# Patient Record
Sex: Male | Born: 1945 | Race: White | Hispanic: No | Marital: Married | Smoking: Former smoker
Health system: Southern US, Community
[De-identification: ages and names within clinical notes are randomized; demographics above are authoritative.]

## PROBLEM LIST (undated history)

## (undated) DIAGNOSIS — B1920 Unspecified viral hepatitis C without hepatic coma: Secondary | ICD-10-CM

## (undated) DIAGNOSIS — Z95818 Presence of other cardiac implants and grafts: Secondary | ICD-10-CM

## (undated) DIAGNOSIS — N189 Chronic kidney disease, unspecified: Secondary | ICD-10-CM

## (undated) DIAGNOSIS — C649 Malignant neoplasm of unspecified kidney, except renal pelvis: Secondary | ICD-10-CM

## (undated) DIAGNOSIS — I10 Essential (primary) hypertension: Secondary | ICD-10-CM

## (undated) DIAGNOSIS — K5730 Diverticulosis of large intestine without perforation or abscess without bleeding: Secondary | ICD-10-CM

## (undated) DIAGNOSIS — M179 Osteoarthritis of knee, unspecified: Secondary | ICD-10-CM

## (undated) DIAGNOSIS — R51 Headache: Secondary | ICD-10-CM

## (undated) DIAGNOSIS — F1911 Other psychoactive substance abuse, in remission: Secondary | ICD-10-CM

## (undated) DIAGNOSIS — I4719 Other supraventricular tachycardia: Secondary | ICD-10-CM

## (undated) DIAGNOSIS — N289 Disorder of kidney and ureter, unspecified: Secondary | ICD-10-CM

## (undated) DIAGNOSIS — Z87438 Personal history of other diseases of male genital organs: Secondary | ICD-10-CM

## (undated) DIAGNOSIS — R739 Hyperglycemia, unspecified: Secondary | ICD-10-CM

## (undated) DIAGNOSIS — I471 Supraventricular tachycardia: Secondary | ICD-10-CM

## (undated) DIAGNOSIS — E119 Type 2 diabetes mellitus without complications: Secondary | ICD-10-CM

## (undated) DIAGNOSIS — E785 Hyperlipidemia, unspecified: Secondary | ICD-10-CM

## (undated) DIAGNOSIS — K7460 Unspecified cirrhosis of liver: Secondary | ICD-10-CM

## (undated) DIAGNOSIS — K219 Gastro-esophageal reflux disease without esophagitis: Secondary | ICD-10-CM

## (undated) DIAGNOSIS — J189 Pneumonia, unspecified organism: Secondary | ICD-10-CM

## (undated) DIAGNOSIS — M1710 Unilateral primary osteoarthritis, unspecified knee: Secondary | ICD-10-CM

## (undated) HISTORY — DX: Other psychoactive substance abuse, in remission: F19.11

## (undated) HISTORY — DX: Personal history of other diseases of male genital organs: Z87.438

## (undated) HISTORY — DX: Chronic kidney disease, unspecified: N18.9

## (undated) HISTORY — DX: Hyperlipidemia, unspecified: E78.5

## (undated) HISTORY — DX: Unspecified viral hepatitis C without hepatic coma: B19.20

## (undated) HISTORY — DX: Pneumonia, unspecified organism: J18.9

## (undated) HISTORY — DX: Unspecified cirrhosis of liver: K74.60

## (undated) HISTORY — DX: Disorder of kidney and ureter, unspecified: N28.9

## (undated) HISTORY — DX: Supraventricular tachycardia: I47.1

## (undated) HISTORY — DX: Diverticulosis of large intestine without perforation or abscess without bleeding: K57.30

## (undated) HISTORY — PX: CARDIAC ELECTROPHYSIOLOGY MAPPING AND ABLATION: SHX1292

## (undated) HISTORY — PX: OTHER SURGICAL HISTORY: SHX169

## (undated) HISTORY — DX: Hyperglycemia, unspecified: R73.9

## (undated) HISTORY — DX: Other supraventricular tachycardia: I47.19

## (undated) HISTORY — DX: Presence of other cardiac implants and grafts: Z95.818

## (undated) HISTORY — DX: Unilateral primary osteoarthritis, unspecified knee: M17.10

## (undated) HISTORY — DX: Osteoarthritis of knee, unspecified: M17.9

## (undated) HISTORY — DX: Malignant neoplasm of unspecified kidney, except renal pelvis: C64.9

---

## 1973-08-01 DIAGNOSIS — J189 Pneumonia, unspecified organism: Secondary | ICD-10-CM

## 1973-08-01 HISTORY — DX: Pneumonia, unspecified organism: J18.9

## 1989-04-01 HISTORY — PX: OTHER SURGICAL HISTORY: SHX169

## 1997-08-01 HISTORY — PX: OTHER SURGICAL HISTORY: SHX169

## 1998-02-27 ENCOUNTER — Emergency Department (HOSPITAL_COMMUNITY): Admission: EM | Admit: 1998-02-27 | Discharge: 1998-02-27 | Payer: Self-pay | Admitting: Emergency Medicine

## 1998-03-19 ENCOUNTER — Ambulatory Visit (HOSPITAL_COMMUNITY): Admission: RE | Admit: 1998-03-19 | Discharge: 1998-03-19 | Payer: Self-pay | Admitting: Gastroenterology

## 1998-03-25 ENCOUNTER — Ambulatory Visit (HOSPITAL_COMMUNITY): Admission: RE | Admit: 1998-03-25 | Discharge: 1998-03-25 | Payer: Self-pay | Admitting: Gastroenterology

## 1998-04-09 ENCOUNTER — Inpatient Hospital Stay (HOSPITAL_COMMUNITY): Admission: RE | Admit: 1998-04-09 | Discharge: 1998-04-14 | Payer: Self-pay | Admitting: Urology

## 1999-04-27 ENCOUNTER — Observation Stay (HOSPITAL_COMMUNITY): Admission: RE | Admit: 1999-04-27 | Discharge: 1999-04-28 | Payer: Self-pay | Admitting: Orthopedic Surgery

## 1999-05-13 ENCOUNTER — Encounter: Admission: RE | Admit: 1999-05-13 | Discharge: 1999-05-13 | Payer: Self-pay | Admitting: Urology

## 1999-11-11 ENCOUNTER — Encounter: Admission: RE | Admit: 1999-11-11 | Discharge: 1999-11-11 | Payer: Self-pay | Admitting: Urology

## 1999-11-11 ENCOUNTER — Encounter: Payer: Self-pay | Admitting: Urology

## 2000-05-25 ENCOUNTER — Encounter: Admission: RE | Admit: 2000-05-25 | Discharge: 2000-05-25 | Payer: Self-pay | Admitting: Urology

## 2000-05-25 ENCOUNTER — Encounter: Payer: Self-pay | Admitting: Urology

## 2000-07-11 ENCOUNTER — Encounter (INDEPENDENT_AMBULATORY_CARE_PROVIDER_SITE_OTHER): Payer: Self-pay

## 2000-07-11 ENCOUNTER — Ambulatory Visit (HOSPITAL_COMMUNITY): Admission: RE | Admit: 2000-07-11 | Discharge: 2000-07-11 | Payer: Self-pay | Admitting: Gastroenterology

## 2000-11-09 ENCOUNTER — Encounter: Admission: RE | Admit: 2000-11-09 | Discharge: 2000-11-09 | Payer: Self-pay | Admitting: Urology

## 2000-11-09 ENCOUNTER — Encounter: Payer: Self-pay | Admitting: Urology

## 2001-05-23 ENCOUNTER — Encounter: Admission: RE | Admit: 2001-05-23 | Discharge: 2001-05-23 | Payer: Self-pay | Admitting: Urology

## 2001-05-23 ENCOUNTER — Encounter: Payer: Self-pay | Admitting: Urology

## 2001-11-29 ENCOUNTER — Encounter: Payer: Self-pay | Admitting: Urology

## 2001-11-29 ENCOUNTER — Encounter: Admission: RE | Admit: 2001-11-29 | Discharge: 2001-11-29 | Payer: Self-pay | Admitting: Urology

## 2002-05-02 ENCOUNTER — Encounter: Admission: RE | Admit: 2002-05-02 | Discharge: 2002-05-02 | Payer: Self-pay | Admitting: Urology

## 2002-05-02 ENCOUNTER — Encounter: Payer: Self-pay | Admitting: Urology

## 2002-11-27 ENCOUNTER — Encounter: Payer: Self-pay | Admitting: Urology

## 2002-11-27 ENCOUNTER — Encounter: Admission: RE | Admit: 2002-11-27 | Discharge: 2002-11-27 | Payer: Self-pay | Admitting: Urology

## 2003-05-01 ENCOUNTER — Encounter: Payer: Self-pay | Admitting: Urology

## 2003-05-01 ENCOUNTER — Ambulatory Visit (HOSPITAL_COMMUNITY): Admission: RE | Admit: 2003-05-01 | Discharge: 2003-05-01 | Payer: Self-pay | Admitting: Urology

## 2003-11-25 ENCOUNTER — Ambulatory Visit (HOSPITAL_COMMUNITY): Admission: RE | Admit: 2003-11-25 | Discharge: 2003-11-25 | Payer: Self-pay | Admitting: Urology

## 2003-12-12 ENCOUNTER — Encounter: Payer: Self-pay | Admitting: Internal Medicine

## 2005-08-01 HISTORY — PX: OTHER SURGICAL HISTORY: SHX169

## 2005-12-21 ENCOUNTER — Ambulatory Visit: Payer: Self-pay | Admitting: Internal Medicine

## 2006-01-17 ENCOUNTER — Ambulatory Visit: Payer: Self-pay | Admitting: Internal Medicine

## 2007-01-16 ENCOUNTER — Ambulatory Visit: Payer: Self-pay | Admitting: Internal Medicine

## 2007-01-16 LAB — CONVERTED CEMR LAB
ALT: 70 units/L — ABNORMAL HIGH (ref 0–40)
AST: 55 units/L — ABNORMAL HIGH (ref 0–37)
Albumin: 3.9 g/dL (ref 3.5–5.2)
Alkaline Phosphatase: 38 units/L — ABNORMAL LOW (ref 39–117)
BUN: 16 mg/dL (ref 6–23)
Basophils Absolute: 0 10*3/uL (ref 0.0–0.1)
Basophils Relative: 0.1 % (ref 0.0–1.0)
Bilirubin Urine: NEGATIVE
Bilirubin, Direct: 0.1 mg/dL (ref 0.0–0.3)
CO2: 28 meq/L (ref 19–32)
Calcium: 9 mg/dL (ref 8.4–10.5)
Chloride: 107 meq/L (ref 96–112)
Cholesterol: 123 mg/dL (ref 0–200)
Creatinine, Ser: 1.1 mg/dL (ref 0.4–1.5)
Eosinophils Absolute: 0.2 10*3/uL (ref 0.0–0.6)
Eosinophils Relative: 3.2 % (ref 0.0–5.0)
GFR calc Af Amer: 88 mL/min
GFR calc non Af Amer: 72 mL/min
Glucose, Bld: 120 mg/dL — ABNORMAL HIGH (ref 70–99)
HCT: 41.6 % (ref 39.0–52.0)
HDL: 24.4 mg/dL — ABNORMAL LOW (ref >39.0)
Hemoglobin, Urine: NEGATIVE
Hemoglobin: 14.1 g/dL (ref 13.0–17.0)
Ketones, ur: NEGATIVE mg/dL
LDL Cholesterol: 85 mg/dL (ref 0–99)
Leukocytes, UA: NEGATIVE
Lymphocytes Relative: 18.4 % (ref 12.0–46.0)
MCHC: 33.9 g/dL (ref 30.0–36.0)
MCV: 93.1 fL (ref 78.0–100.0)
Monocytes Absolute: 0.9 10*3/uL — ABNORMAL HIGH (ref 0.2–0.7)
Monocytes Relative: 12.2 % — ABNORMAL HIGH (ref 3.0–11.0)
Neutro Abs: 4.7 10*3/uL (ref 1.4–7.7)
Neutrophils Relative %: 66.1 % (ref 43.0–77.0)
Nitrite: NEGATIVE
PSA: 0.46 ng/mL (ref 0.10–4.00)
Platelets: 223 10*3/uL (ref 150–400)
Potassium: 4 meq/L (ref 3.5–5.1)
RBC: 4.47 M/uL (ref 4.22–5.81)
RDW: 12.7 % (ref 11.5–14.6)
Sodium: 141 meq/L (ref 135–145)
Specific Gravity, Urine: 1.02 (ref 1.000–1.03)
TSH: 1.36 microintl units/mL (ref 0.35–5.50)
Total Bilirubin: 0.9 mg/dL (ref 0.3–1.2)
Total CHOL/HDL Ratio: 5
Total Protein, Urine: NEGATIVE mg/dL
Total Protein: 7.2 g/dL (ref 6.0–8.3)
Triglycerides: 67 mg/dL (ref 0–149)
Urine Glucose: NEGATIVE mg/dL
Urobilinogen, UA: 0.2 (ref 0.0–1.0)
VLDL: 13 mg/dL (ref 0–40)
WBC: 7.1 10*3/uL (ref 4.5–10.5)
pH: 6.5 (ref 5.0–8.0)

## 2007-01-22 ENCOUNTER — Ambulatory Visit: Payer: Self-pay | Admitting: Internal Medicine

## 2007-02-28 ENCOUNTER — Ambulatory Visit: Payer: Self-pay | Admitting: Internal Medicine

## 2007-07-02 ENCOUNTER — Emergency Department (HOSPITAL_COMMUNITY): Admission: EM | Admit: 2007-07-02 | Discharge: 2007-07-02 | Payer: Self-pay | Admitting: Emergency Medicine

## 2007-07-04 ENCOUNTER — Telehealth: Payer: Self-pay | Admitting: Internal Medicine

## 2007-07-04 DIAGNOSIS — I499 Cardiac arrhythmia, unspecified: Secondary | ICD-10-CM | POA: Insufficient documentation

## 2007-07-06 ENCOUNTER — Ambulatory Visit: Payer: Self-pay

## 2007-07-17 ENCOUNTER — Encounter: Payer: Self-pay | Admitting: Internal Medicine

## 2007-09-18 ENCOUNTER — Telehealth: Payer: Self-pay | Admitting: Internal Medicine

## 2007-09-18 ENCOUNTER — Encounter: Payer: Self-pay | Admitting: Internal Medicine

## 2007-10-02 ENCOUNTER — Encounter: Payer: Self-pay | Admitting: Internal Medicine

## 2007-10-26 DIAGNOSIS — C649 Malignant neoplasm of unspecified kidney, except renal pelvis: Secondary | ICD-10-CM | POA: Insufficient documentation

## 2007-10-26 DIAGNOSIS — J189 Pneumonia, unspecified organism: Secondary | ICD-10-CM | POA: Insufficient documentation

## 2007-10-26 DIAGNOSIS — Z9189 Other specified personal risk factors, not elsewhere classified: Secondary | ICD-10-CM | POA: Insufficient documentation

## 2007-10-26 DIAGNOSIS — K5730 Diverticulosis of large intestine without perforation or abscess without bleeding: Secondary | ICD-10-CM | POA: Insufficient documentation

## 2007-10-26 DIAGNOSIS — F1910 Other psychoactive substance abuse, uncomplicated: Secondary | ICD-10-CM | POA: Insufficient documentation

## 2007-10-26 DIAGNOSIS — Z87898 Personal history of other specified conditions: Secondary | ICD-10-CM | POA: Insufficient documentation

## 2007-10-26 DIAGNOSIS — Z9889 Other specified postprocedural states: Secondary | ICD-10-CM | POA: Insufficient documentation

## 2007-10-26 DIAGNOSIS — M1710 Unilateral primary osteoarthritis, unspecified knee: Secondary | ICD-10-CM | POA: Insufficient documentation

## 2007-10-26 DIAGNOSIS — E119 Type 2 diabetes mellitus without complications: Secondary | ICD-10-CM | POA: Insufficient documentation

## 2007-10-26 DIAGNOSIS — IMO0002 Reserved for concepts with insufficient information to code with codable children: Secondary | ICD-10-CM | POA: Insufficient documentation

## 2007-10-26 DIAGNOSIS — E1129 Type 2 diabetes mellitus with other diabetic kidney complication: Secondary | ICD-10-CM | POA: Insufficient documentation

## 2007-10-26 DIAGNOSIS — M179 Osteoarthritis of knee, unspecified: Secondary | ICD-10-CM | POA: Insufficient documentation

## 2007-10-26 DIAGNOSIS — B1710 Acute hepatitis C without hepatic coma: Secondary | ICD-10-CM | POA: Insufficient documentation

## 2007-10-26 DIAGNOSIS — Z905 Acquired absence of kidney: Secondary | ICD-10-CM | POA: Insufficient documentation

## 2007-11-15 ENCOUNTER — Encounter: Payer: Self-pay | Admitting: Internal Medicine

## 2007-11-18 LAB — HM COLONOSCOPY

## 2007-11-19 ENCOUNTER — Encounter: Admission: RE | Admit: 2007-11-19 | Discharge: 2007-11-19 | Payer: Self-pay | Admitting: Gastroenterology

## 2008-01-23 ENCOUNTER — Telehealth (INDEPENDENT_AMBULATORY_CARE_PROVIDER_SITE_OTHER): Payer: Self-pay | Admitting: *Deleted

## 2008-03-17 ENCOUNTER — Encounter: Payer: Self-pay | Admitting: Internal Medicine

## 2008-04-11 ENCOUNTER — Ambulatory Visit: Payer: Self-pay | Admitting: Internal Medicine

## 2008-04-11 LAB — CONVERTED CEMR LAB
ALT: 80 units/L — ABNORMAL HIGH (ref 0–53)
AST: 56 units/L — ABNORMAL HIGH (ref 0–37)
Albumin: 4.1 g/dL (ref 3.5–5.2)
Alkaline Phosphatase: 44 units/L (ref 39–117)
BUN: 15 mg/dL (ref 6–23)
Basophils Absolute: 0.1 10*3/uL (ref 0.0–0.1)
Basophils Relative: 1.3 % (ref 0.0–3.0)
Bilirubin Urine: NEGATIVE
Bilirubin, Direct: 0.2 mg/dL (ref 0.0–0.3)
CO2: 30 meq/L (ref 19–32)
Calcium: 9 mg/dL (ref 8.4–10.5)
Chloride: 106 meq/L (ref 96–112)
Cholesterol: 116 mg/dL (ref 0–200)
Creatinine, Ser: 1.1 mg/dL (ref 0.4–1.5)
Eosinophils Absolute: 0.2 10*3/uL (ref 0.0–0.7)
Eosinophils Relative: 3.4 % (ref 0.0–5.0)
GFR calc Af Amer: 87 mL/min
GFR calc non Af Amer: 72 mL/min
Glucose, Bld: 117 mg/dL — ABNORMAL HIGH (ref 70–99)
HCT: 42.6 % (ref 39.0–52.0)
HDL: 24.6 mg/dL — ABNORMAL LOW (ref >39.0)
Hemoglobin, Urine: NEGATIVE
Hemoglobin: 14.9 g/dL (ref 13.0–17.0)
Ketones, ur: NEGATIVE mg/dL
LDL Cholesterol: 80 mg/dL (ref 0–99)
Leukocytes, UA: NEGATIVE
Lymphocytes Relative: 25.4 % (ref 12.0–46.0)
MCHC: 35 g/dL (ref 30.0–36.0)
MCV: 93.8 fL (ref 78.0–100.0)
Monocytes Absolute: 0.6 10*3/uL (ref 0.1–1.0)
Monocytes Relative: 11.4 % (ref 3.0–12.0)
Neutro Abs: 3.1 10*3/uL (ref 1.4–7.7)
Neutrophils Relative %: 58.5 % (ref 43.0–77.0)
Nitrite: NEGATIVE
PSA: 0.37 ng/mL (ref 0.10–4.00)
Platelets: 192 10*3/uL (ref 150–400)
Potassium: 3.9 meq/L (ref 3.5–5.1)
RBC: 4.53 M/uL (ref 4.22–5.81)
RDW: 12.7 % (ref 11.5–14.6)
Sodium: 140 meq/L (ref 135–145)
Specific Gravity, Urine: 1.02 (ref 1.000–1.03)
TSH: 1.42 microintl units/mL (ref 0.35–5.50)
Total Bilirubin: 0.8 mg/dL (ref 0.3–1.2)
Total CHOL/HDL Ratio: 4.7
Total Protein, Urine: NEGATIVE mg/dL
Total Protein: 7 g/dL (ref 6.0–8.3)
Triglycerides: 56 mg/dL (ref 0–149)
Urine Glucose: NEGATIVE mg/dL
Urobilinogen, UA: 0.2 (ref 0.0–1.0)
VLDL: 11 mg/dL (ref 0–40)
WBC: 5.4 10*3/uL (ref 4.5–10.5)
pH: 6 (ref 5.0–8.0)

## 2008-04-17 ENCOUNTER — Ambulatory Visit: Payer: Self-pay | Admitting: Internal Medicine

## 2008-04-24 ENCOUNTER — Telehealth: Payer: Self-pay | Admitting: Internal Medicine

## 2008-04-24 ENCOUNTER — Ambulatory Visit: Payer: Self-pay | Admitting: Internal Medicine

## 2008-07-21 ENCOUNTER — Telehealth: Payer: Self-pay | Admitting: Internal Medicine

## 2008-07-29 ENCOUNTER — Encounter: Payer: Self-pay | Admitting: Internal Medicine

## 2008-07-31 ENCOUNTER — Emergency Department (HOSPITAL_COMMUNITY): Admission: EM | Admit: 2008-07-31 | Discharge: 2008-07-31 | Payer: Self-pay | Admitting: Emergency Medicine

## 2008-08-01 ENCOUNTER — Emergency Department (HOSPITAL_COMMUNITY): Admission: EM | Admit: 2008-08-01 | Discharge: 2008-08-01 | Payer: Self-pay | Admitting: Emergency Medicine

## 2008-08-04 ENCOUNTER — Encounter: Payer: Self-pay | Admitting: Internal Medicine

## 2009-03-17 ENCOUNTER — Encounter: Payer: Self-pay | Admitting: Internal Medicine

## 2009-04-07 ENCOUNTER — Ambulatory Visit: Payer: Self-pay | Admitting: Internal Medicine

## 2009-04-07 LAB — CONVERTED CEMR LAB
ALT: 68 units/L — ABNORMAL HIGH (ref 0–53)
AST: 56 units/L — ABNORMAL HIGH (ref 0–37)
Albumin: 4.1 g/dL (ref 3.5–5.2)
Alkaline Phosphatase: 37 units/L — ABNORMAL LOW (ref 39–117)
BUN: 16 mg/dL (ref 6–23)
Basophils Absolute: 0 10*3/uL (ref 0.0–0.1)
Basophils Relative: 0.5 % (ref 0.0–3.0)
Bilirubin Urine: NEGATIVE
Bilirubin, Direct: 0.2 mg/dL (ref 0.0–0.3)
CO2: 28 meq/L (ref 19–32)
Calcium: 9.1 mg/dL (ref 8.4–10.5)
Chloride: 104 meq/L (ref 96–112)
Cholesterol: 124 mg/dL (ref 0–200)
Creatinine, Ser: 1.1 mg/dL (ref 0.4–1.5)
Eosinophils Absolute: 0.1 10*3/uL (ref 0.0–0.7)
Eosinophils Relative: 2.6 % (ref 0.0–5.0)
GFR calc non Af Amer: 71.74 mL/min (ref >60)
Glucose, Bld: 118 mg/dL — ABNORMAL HIGH (ref 70–99)
HCT: 44.8 % (ref 39.0–52.0)
HDL: 29.1 mg/dL — ABNORMAL LOW (ref >39.00)
Hemoglobin, Urine: NEGATIVE
Hemoglobin: 15 g/dL (ref 13.0–17.0)
Ketones, ur: NEGATIVE mg/dL
LDL Cholesterol: 84 mg/dL (ref 0–99)
Leukocytes, UA: NEGATIVE
Lymphocytes Relative: 21.1 % (ref 12.0–46.0)
Lymphs Abs: 1.2 10*3/uL (ref 0.7–4.0)
MCHC: 33.5 g/dL (ref 30.0–36.0)
MCV: 96.4 fL (ref 78.0–100.0)
Monocytes Absolute: 0.6 10*3/uL (ref 0.1–1.0)
Monocytes Relative: 11.1 % (ref 3.0–12.0)
Neutro Abs: 3.7 10*3/uL (ref 1.4–7.7)
Neutrophils Relative %: 64.7 % (ref 43.0–77.0)
Nitrite: NEGATIVE
PSA: 0.45 ng/mL (ref 0.10–4.00)
Platelets: 190 10*3/uL (ref 150.0–400.0)
Potassium: 3.8 meq/L (ref 3.5–5.1)
RBC: 4.65 M/uL (ref 4.22–5.81)
RDW: 12.8 % (ref 11.5–14.6)
Sodium: 140 meq/L (ref 135–145)
Specific Gravity, Urine: 1.01 (ref 1.000–1.030)
TSH: 1.06 microintl units/mL (ref 0.35–5.50)
Total Bilirubin: 1.2 mg/dL (ref 0.3–1.2)
Total CHOL/HDL Ratio: 4
Total Protein, Urine: NEGATIVE mg/dL
Total Protein: 8.1 g/dL (ref 6.0–8.3)
Triglycerides: 55 mg/dL (ref 0.0–149.0)
Urine Glucose: NEGATIVE mg/dL
Urobilinogen, UA: 0.2 (ref 0.0–1.0)
VLDL: 11 mg/dL (ref 0.0–40.0)
WBC: 5.6 10*3/uL (ref 4.5–10.5)
pH: 6.5 (ref 5.0–8.0)

## 2009-04-20 ENCOUNTER — Ambulatory Visit: Payer: Self-pay | Admitting: Internal Medicine

## 2009-11-05 ENCOUNTER — Encounter: Payer: Self-pay | Admitting: Internal Medicine

## 2010-03-24 ENCOUNTER — Encounter: Payer: Self-pay | Admitting: Internal Medicine

## 2010-04-01 ENCOUNTER — Encounter: Payer: Self-pay | Admitting: Internal Medicine

## 2010-04-07 ENCOUNTER — Encounter: Admission: RE | Admit: 2010-04-07 | Discharge: 2010-04-07 | Payer: Self-pay | Admitting: Gastroenterology

## 2010-04-20 ENCOUNTER — Ambulatory Visit: Payer: Self-pay | Admitting: Internal Medicine

## 2010-04-20 LAB — CONVERTED CEMR LAB
ALT: 81 units/L — ABNORMAL HIGH (ref 0–53)
AST: 69 units/L — ABNORMAL HIGH (ref 0–37)
Albumin: 4.3 g/dL (ref 3.5–5.2)
Alkaline Phosphatase: 39 units/L (ref 39–117)
BUN: 18 mg/dL (ref 6–23)
Basophils Absolute: 0 10*3/uL (ref 0.0–0.1)
Basophils Relative: 0.4 % (ref 0.0–3.0)
Bilirubin Urine: NEGATIVE
Bilirubin, Direct: 0.2 mg/dL (ref 0.0–0.3)
CO2: 29 meq/L (ref 19–32)
Calcium: 9.5 mg/dL (ref 8.4–10.5)
Chloride: 101 meq/L (ref 96–112)
Cholesterol: 152 mg/dL (ref 0–200)
Creatinine, Ser: 1.2 mg/dL (ref 0.4–1.5)
Eosinophils Absolute: 0.2 10*3/uL (ref 0.0–0.7)
Eosinophils Relative: 3.1 % (ref 0.0–5.0)
GFR calc non Af Amer: 63.46 mL/min (ref >60)
Glucose, Bld: 122 mg/dL — ABNORMAL HIGH (ref 70–99)
HCT: 43.7 % (ref 39.0–52.0)
HDL: 30.6 mg/dL — ABNORMAL LOW (ref >39.00)
Hemoglobin, Urine: NEGATIVE
Hemoglobin: 15.1 g/dL (ref 13.0–17.0)
Ketones, ur: NEGATIVE mg/dL
LDL Cholesterol: 107 mg/dL — ABNORMAL HIGH (ref 0–99)
Lymphocytes Relative: 23.1 % (ref 12.0–46.0)
Lymphs Abs: 1.4 10*3/uL (ref 0.7–4.0)
MCHC: 34.5 g/dL (ref 30.0–36.0)
MCV: 95.4 fL (ref 78.0–100.0)
Monocytes Absolute: 0.8 10*3/uL (ref 0.1–1.0)
Monocytes Relative: 12.8 % — ABNORMAL HIGH (ref 3.0–12.0)
Neutro Abs: 3.7 10*3/uL (ref 1.4–7.7)
Neutrophils Relative %: 60.6 % (ref 43.0–77.0)
Nitrite: NEGATIVE
PSA: 0.45 ng/mL (ref 0.10–4.00)
Platelets: 203 10*3/uL (ref 150.0–400.0)
Potassium: 4.6 meq/L (ref 3.5–5.1)
RBC: 4.58 M/uL (ref 4.22–5.81)
RDW: 13.6 % (ref 11.5–14.6)
Sodium: 139 meq/L (ref 135–145)
Specific Gravity, Urine: <=1.005 (ref 1.000–1.030)
TSH: 1.3 microintl units/mL (ref 0.35–5.50)
Total Bilirubin: 1 mg/dL (ref 0.3–1.2)
Total CHOL/HDL Ratio: 5
Total Protein, Urine: NEGATIVE mg/dL
Total Protein: 7.1 g/dL (ref 6.0–8.3)
Triglycerides: 73 mg/dL (ref 0.0–149.0)
Urine Glucose: NEGATIVE mg/dL
Urobilinogen, UA: 0.2 (ref 0.0–1.0)
VLDL: 14.6 mg/dL (ref 0.0–40.0)
WBC: 6.2 10*3/uL (ref 4.5–10.5)
pH: 7 (ref 5.0–8.0)

## 2010-04-29 ENCOUNTER — Ambulatory Visit: Payer: Self-pay | Admitting: Internal Medicine

## 2010-04-29 ENCOUNTER — Encounter: Payer: Self-pay | Admitting: Internal Medicine

## 2010-08-31 NOTE — Assessment & Plan Note (Signed)
Summary: CPX/BCBS/$50/CD   Vital Signs:  Patient profile:   65 year old male Height:      73 inches Weight:      207 pounds BMI:     27.41 Temp:     97.3 degrees F oral Pulse rate:   84 / minute Pulse rhythm:   regular BP sitting:   112 / 80  (left arm) Cuff size:   large  Vitals Entered By: Rock Nephew CMA (April 20, 2009 10:03 AM) CC: cpx....pt not longer on glucosamine Is Patient Diabetic? No   Primary Care Provider:  Jacques Navy MD  CC:  cpx....pt not longer on glucosamine.  History of Present Illness: Patient presents for medical follow-up. He has been doing well. Issues in the interval include epistasis that was treated with cautery by Dr. Lazarus Salines. He has seen Dr. Kinnie Scales in August and was doing OK. His last liver bx showed no cirrhosis, 1+ inflammation. Dr. Kinnie Scales wants to repeat liver bx in 1 year. We discussed prognosis which is good with low level of inflammation and no cirrhosis. Also the percentage of patients who progress to liver failure or hepatoma is 12.5% or less. There are also new treatments that are better tolerated with more to come.  He did have episodes of rapide heart rate, sustained, in the past but has had event recorder with no report of any malignant arrhythmias. He does have PVC's by his report. No sustained irregular heart rate, no chest pain, no limitation in activities.     Preventive Screening-Counseling & Management  Alcohol-Tobacco     Alcohol drinks/day: 0     Smoking Status: quit     Year Quit: 1979     Pack years: 15  Caffeine-Diet-Exercise     Caffeine use/day: 3 cups of coffee     Diet Comments: healthy diet     Does Patient Exercise: yes     Type of exercise: treadmill     Exercise (avg: min/session): 30-60     Times/week: 3  Current Medications (verified): 1)  Prilosec 20 Mg Cpdr (Omeprazole) .... Take 1 Tab By Mouth At Bedtime 2)  Multivitamins   Tabs (Multiple Vitamin) .... Take Once Daily 3)  Aspir-Low 81 Mg  Tbec (Aspirin) .[name].. 1 By Mouth Once Daily 4)  Juice Plus Fibre  Liqd (Nutritional Supplements) .... Daily  Allergies (verified): No Known Drug Allergies  Past History:  Past Medical History: Last updated: 10/26/2007 Hx of OSTEOARTHRITIS, KNEE (ICD-715.96) Hx of DIVERTICULOSIS, COLON (ICD-562.10) * SHORTENED RIGHT LEG CAUSES SOME BACK PAIN. Hx of PNEUMONIA (ICD-486) Hx of SUBSTANCE ABUSE (ICD-305.90) Hx of CARCINOMA, RENAL CELL (ICD-189.0) HYPERGLYCEMIA (ICD-790.29) BENIGN PROSTATIC HYPERTROPHY, HX OF (ICD-V13.8) HEPATITIS C (ICD-070.51) UNSPECIFIED CARDIAC DYSRHYTHMIA (ICD-427.9)    Past Surgical History: Last updated: 10/26/2007 ARTHROSCOPY, LEFT KNEE, HX OF (ICD-V45.89) * LEFT SHOULDER REPAIR  FOR BONE SPUR. NEEDLE BIOPSY, LIVER, HX OF (ICD-V15.89) * TRANSURETHRAL NEEDLE ABLATION PROCEDURE. * TUNA PROCEDURE NEPHRECTOMY, HX OF (ICD-V45.73)  Family History: Father - deceased @ 44: variceal hemorrhae secondary to EtOH Mother - deceased @ 45: CVA, CAD Neg- colon; CAD/MI; Brother - died head and neck cancer; had prostate cancer Sister- NIDDM Sister - died EtOH withdrawal Strong family h/o EtOHism  Social History: [place] [employer], [place] - [place] -psych Married - '84 2 step-daughters Work - Buyer, retail Marriage in good healthCaffeine use/day:  3 cups of coffee  Review of Systems  The patient denies anorexia, weight loss, weight gain, decreased hearing,  chest pain, syncope, dyspnea on exertion, peripheral edema, prolonged cough, abdominal pain, severe indigestion/heartburn, genital sores, muscle weakness, difficulty walking, depression, enlarged lymph nodes, and angioedema.         arthritic pain in hands with a morning gel period 20-30 min.  Physical Exam  General:  WNWD whaite male in no distress, comfortable and a good historian Head:  Normocephalic and atraumatic without obvious abnormalities. No apparent alopecia or balding. Eyes:   vision grossly intact, corneas and lenses clear, and no injection.   Ears:  External ear exam shows no significant lesions or deformities.  Otoscopic examination reveals clear canals, tympanic membranes are intact bilaterally without bulging, retraction, inflammation or discharge. Hearing is grossly normal bilaterally. Nose:  no external deformity and no external erythema.   Mouth:  Oral mucosa and oropharynx without lesions or exudates.  Teeth in good repair. Neck:  supple, full ROM, no thyromegaly, and no carotid bruits.   Chest Wall:  no deformities and no masses.   Lungs:  Normal respiratory effort, chest expands symmetrically. Lungs are clear to auscultation, no crackles or wheezes. Heart:  Normal rate and regular rhythm. S1 and S2 normal without gallop, murmur, click, rub or other extra sounds. No PVC's with carefl auscultation Abdomen:  soft, non-tender, normal bowel sounds, no guarding, and no rigidity.  Well healed RUQ scar. No hepatomegaly or palpable liver edge Scar at umbilicus Rectal:  No external abnormalities noted. Normal sphincter tone. No rectal masses or tenderness. Prostate:  Prostate gland firm and smooth, no enlargement, nodularity, tenderness, mass, asymmetry or induration. Msk:  normal ROM, no joint tenderness, no joint swelling, no joint warmth, no redness over joints, and no joint instability.   Pulses:  2+ radial and DP pulses Extremities:  No clubbing, cyanosis, edema, or deformity noted with normal full range of motion of all joints.   Neurologic:  alert & oriented X3, cranial nerves II-XII intact, strength normal in all extremities, gait normal, and DTRs symmetrical and normal.   Skin:  turgor normal, color normal, no rashes, no suspicious lesions, no ulcerations, and no edema.   Cervical Nodes:  no anterior cervical adenopathy and no posterior cervical adenopathy.   Inguinal Nodes:  no R inguinal adenopathy and no L inguinal adenopathy.   Psych:  Oriented X3, memory  intact for recent and remote, normally interactive, good eye contact, and not anxious appearing.     Impression & Recommendations:  Problem # 1:  Hx of OSTEOARTHRITIS, KNEE (ICD-715.96) Doing well with no limiting pain or loss of ROM  His updated medication list for this problem includes:    Aspir-low 81 Mg Tbec (Aspirin) .[name]... 1 by mouth once daily  Problem # 2:  Hx of PNEUMONIA (ICD-486) No respiratory distress and normal exam.  Plan - follow up Chest x-ray.  Orders: T-2 View CXR ([id number])  Normal study.  DG CHEST 2 VIEW - [id number]   Clinical Data: Physical, history of renal cell carcinoma.   CHEST - 2 VIEW   Comparison: 04/17/2008   Findings: Trachea is midline.  Heart size normal.  Lungs are mildly hyperinflated, with flattening of the hemidiaphragms.  No pleural fluid.   IMPRESSION: COPD without acute finding.   Read By:  Reyes Ivan.,  M.D.     Released By:  Reyes Ivan.,  M.D.  Problem # 3:  HYPERGLYCEMIA (ICD-790.29) Patient with a flucuating serum glucose over the last three years with an average of 115.  PlaN -borderline glucose  levels; no  indication for medication.           Minimal sugar and low carb diet  Problem # 4:  BENIGN PROSTATIC HYPERTROPHY, HX OF (ICD-V13.8) Minimal symptoms. Had TUN-A in the past.  Plan - no intervention or medicatiions indicated   Problem # 5:  HEPATITIS C (ICD-070.51) See discussion in the HPI  Plan - follow-up with Dr. Kinnie Scales as instructed.  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 He has learned to do a valsalva manuever when symptomatic. Event recorder report not in EMR, howver he was not informed of any malignant arrhythmias being noted.  Plan - no intervention or medication. If his symptoms get worse will refer to EP  His updated medication list for this problem includes:    Aspir-low 81 Mg Tbec (Aspirin) .[name]... 1 by mouth once daily  Problem  # 7:  Preventive Health Care (ICD-V70.0) History is stable. Physical exam is normal. Lab results are fine. He is current with colorectal and prostaqte cancer screening. Immuniations - he has had zostavax, he is given pneumovax today.  In summary- a very nice man who is medically stable with problems as outlined above. He will return in 1 year or as needed.   Complete Medication List: 1)  Prilosec 20 Mg Cpdr (Omeprazole) .... Take 1 tab by mouth at bedtime 2)  Multivitamins Tabs (Multiple vitamin) .... Take once daily 3)  Aspir-low 81 Mg Tbec (Aspirin) .[name].. 1 by mouth once daily 4)  Juice Plus Fibre Liqd (Nutritional supplements) .... Daily   Patient: Timothy Dickson Note: All result statuses are Final unless otherwise noted.  Tests: (1) Lipid Panel (LIPID)   Cholesterol               124 mg/dL                   1-610     ATP III Classification            Desirable:  < 200 mg/dL                    Borderline High:  200 - 239 mg/dL               High:  > = 240 mg/dL   Triglycerides             55.0 mg/dL                  9.6-045.4     Normal:  <150 mg/dL     Borderline High:  098 - 199 mg/dL   HDL                  [L]  11.91 mg/dL                 >47.82   VLDL Cholesterol          11.0 mg/dL                  9.5-62.1   LDL Cholesterol           84 mg/dL                    3-08  CHO/HDL Ratio:  CHD Risk                             4  Men          Women     1/2 Average Risk     3.4          3.3     Average Risk          5.0          4.4     2X Average Risk          9.6          7.1     3X Average Risk          15.0          11.0                           Tests: (2) BMP (METABOL)   Sodium                    140 mEq/L                   135-145   Potassium                 3.8 mEq/L                   3.5-5.1   Chloride                  104 mEq/L                   96-112   Carbon Dioxide            28 mEq/L                    19-32   Glucose              [H]  118 mg/dL                    11-91   BUN                       16 mg/dL                    4-78   Creatinine                1.1 mg/dL                   2.9-5.6   Calcium                   9.1 mg/dL                   2.1-30.8   GFR                       71.74 mL/min                >60  Tests: (3) CBC Platelet w/Diff (CBCD)   White Cell Count          5.6 K/uL                    4.5-10.5   Red Cell Count            4.65 Mil/uL                 4.22-5.81   Hemoglobin  15.0 g/dL                   04.5-40.9   Hematocrit                44.8 %                      39.0-52.0   MCV                       96.4 fl                     78.0-100.0   MCHC                      33.5 g/dL                   81.1-91.4   RDW                       12.8 %                      11.5-14.6   Platelet Count            190.0 K/uL                  150.0-400.0   Neutrophil %              64.7 %                      43.0-77.0   Lymphocyte %              21.1 %                      12.0-46.0   Monocyte %                11.1 %                      3.0-12.0   Eosinophils%              2.6 %                       0.0-5.0   Basophils %               0.5 %                       0.0-3.0   Neutrophill Absolute      3.7 K/uL                    1.4-7.7   Lymphocyte Absolute       1.2 K/uL                    0.7-4.0   Monocyte Absolute         0.6 K/uL                    0.1-1.0  Eosinophils, Absolute                             0.1 K/uL                    0.0-0.7  Basophils Absolute        0.0 K/uL                    0.0-0.1  Tests: (4) Hepatic/Liver Function Panel (HEPATIC)   Total Bilirubin           1.2 mg/dL                   9.5-6.2   Direct Bilirubin          0.2 mg/dL                   1.3-0.8   Alkaline Phosphatase [L]  37 U/L                      39-117   AST                  [H]  56 U/L                      0-37   ALT                  [H]  68 U/L                      0-53   Total Protein             8.1 g/dL                     6.5-7.8   Albumin                   4.1 g/dL                    4.6-9.6  Tests: (5) TSH (TSH)   FastTSH                   1.06 uIU/mL                 0.35-5.50  Tests: (6) UDip Only (UDIP)   Color                     LT. YELLOW       RANGE:  Yellow;Lt. Yellow   Clarity                   CLEAR                       Clear   Specific Gravity          1.010                       1.000 - 1.030   Urine Ph                  6.5                         5.0-8.0   Protein                   NEGATIVE                    Negative   Urine Glucose             NEGATIVE  Negative   Ketones                   NEGATIVE                    Negative   Urine Bilirubin           NEGATIVE                    Negative   Blood                     NEGATIVE                    Negative   Urobilinogen              0.2                         0.0 - 1.0   Leukocyte Esterace        NEGATIVE                    Negative   Nitrite                   NEGATIVE                    Negative  Tests: (7) Prostate Specific Antigen (PSA)   PSA-Hyb                   0.45 ng/mL                  0.10-4.00     Pneumococcal Immunization History:    Pneumococcal # 1:  Prevnar (05/14/2008)

## 2010-08-31 NOTE — Procedures (Signed)
Summary: [facility]   Imported By: Sherian Rein 05/03/2010 13:56:33  _____________________________________________________________________  External Attachment:    Type:   Image     Comment:   External Document

## 2010-08-31 NOTE — Progress Notes (Signed)
Summary: DUE FOR CHEST X-RAY   Phone Note Call from Patient Call back at Work Phone [phone]   Caller: Patient Call For: NURSE Summary of Call: PATIENT COMING FOR CPE ON 04/16/08 AND WANTED MD TO KNOW THAT HE WILL BE DUE FOR HIS ANNUAL CHEST X-RAY AS WELL. NURSE CALLED AND INFORMED PATIENT THAT MD WILL GIVE ORDER FOR THAT ON THE DAY OF HIS VISIT VIA MESSAGE MACHINE.   Initial call taken by: Harlene Salts,  January 23, 2008 11:01 AM

## 2010-08-31 NOTE — Consult Note (Signed)
Summary: [facility]  [facility]   Imported By: Esmeralda Links D'jimraou 11/28/2007 11:47:50  _____________________________________________________________________  External Attachment:    Type:   Image     Comment:   External Document

## 2010-08-31 NOTE — Letter (Signed)
Summary: [facility]   Imported By: Lester [place] 04/06/2010 10:22:01  _____________________________________________________________________  External Attachment:    Type:   Image     Comment:   External Document

## 2010-08-31 NOTE — Consult Note (Signed)
Summary: progression HCV/[facility]  progression HCV/[facility]   Imported By: Lester Pettibone 04/02/2008 09:50:30  _____________________________________________________________________  External Attachment:    Type:   Image     Comment:   External Document

## 2010-08-31 NOTE — Progress Notes (Signed)
Summary: Nosebleeds  Phone Note Call from Patient   Summary of Call: Pt c/o "major" nosebleeds since october. Approx 4 since october, but they were severe. Has been using humidifier and saline nasal spray. Worried b/c brother died of head & neck cancer, & first symptom was nose bleeds. He wants to know if she should see an ENT or what Dr suggests.  Initial call taken by: Lamar Sprinkles,  July 21, 2008 9:43 AM  Follow-up for Phone Call        Definitely should see an ENT to look for bleeding source. If he doesn't have an ENT I recommend [facility] Follow-up by: Jacques Navy MD,  July 21, 2008 9:50 AM  Additional Follow-up for Phone Call Additional follow up Details #1::        called pt no answer left message on vm to call office back   calld pt no answer Additional Follow-up by: Windell [place],  July 21, 2008 11:11 AM    Additional Follow-up for Phone Call Additional follow up Details #2::    Pt not home yet, told wife we would call tomorrow at wk # .................Marland KitchenLamar Sprinkles  July 21, 2008 5:12 PM   Attempted to leave vm on pt's wk vm, it cut me off multiple times and required re recording, so attempted to leave vm.......................Marland KitchenLamar Sprinkles  July 22, 2008 9:08 AM   Additional Follow-up for Phone Call Additional follow up Details #3:: Details for Additional Follow-up Action Taken: pt called and left vm to call back at work number [phone]................Marland Kitchenspoke with pt at work number and relayed Dr. Debby Bud message he said he will give that ENT a call Additional Follow-up by: Windell [place],  July 22, 2008 10:03 AM

## 2010-08-31 NOTE — Consult Note (Signed)
Summary: Nosebleed/[facility]  Nosebleed/[facility]   Imported By: Lester Levan 08/04/2008 10:33:27  _____________________________________________________________________  External Attachment:    Type:   Image     Comment:   External Document

## 2010-08-31 NOTE — Assessment & Plan Note (Signed)
Summary: CPX-LB   Vital Signs:  Patient profile:   65 year old male Height:      73 inches Weight:      211 pounds BMI:     27.94 O2 Sat:      96 % on Room air Temp:     97.4 degrees F oral Pulse rate:   59 / minute BP sitting:   118 / 78  (left arm) Cuff size:   regular  Vitals Entered By: Bill Salinas CMA (April 29, 2010 10:12 AM)  O2 Flow:  Room air CC: cpx/ ab Comments Flu shot today   Primary Care Provider:  Jacques Navy MD  CC:  cpx/ ab.  History of Present Illness: Patient presents for medical follow-up. IN the last year he has had full evaluation by Dr. Kinnie Scales: labs were OK, he had EGD that was normal including absence of varices. He is still not interested in currently available therapies.  He did have recurrent right knee pain. MRI revealed torn miniscus and Dr. Charlann Boxer took him for arthroscopy - painful recovery but now painfree and doing well.   Current Medications (verified): 1)  Prilosec 20 Mg Cpdr (Omeprazole) .... Take 1 Tab By Mouth At Bedtime 2)  Multivitamins   Tabs (Multiple Vitamin) .... Take Once Daily 3)  Aspir-Low 81 Mg Tbec (Aspirin) .[name].. 1 By Mouth Once Daily 4)  Juice Plus Fibre  Liqd (Nutritional Supplements) .... Daily  Allergies (verified): No Known Drug Allergies  Past History:  Past Surgical History: ARTHROSCOPY, LEFT KNEE, HX OF (ICD-V45.89) * LEFT SHOULDER REPAIR  FOR BONE SPUR. NEEDLE BIOPSY, LIVER, HX OF (ICD-V15.89) * TRANSURETHRAL NEEDLE ABLATION PROCEDURE. * TUNA PROCEDURE NEPHRECTOMY, HX OF (ICD-V45.73) ARTHROSCOPY, RIGHT KNEE - mAY '11 (Dr. Charlann Boxer)  Social History: Cameron Ali BA, [place] Leanora Cover - [place] -psych Married - '84 2 step-daughters, 4 grandchildren Work - Buyer, retail Marriage in good health  Review of Systems  The patient denies anorexia, fever, weight loss, weight gain, vision loss, hoarseness, syncope, dyspnea on exertion, prolonged cough, hemoptysis, abdominal pain, hematochezia,  severe indigestion/heartburn, incontinence, muscle weakness, difficulty walking, depression, enlarged lymph nodes, and angioedema.    Physical Exam  General:  WNWD white male who is no distress and generally looks healthy Head:  normocephalic, atraumatic, and no abnormalities observed.   Eyes:  vision grossly intact, pupils equal, pupils round, corneas and lenses clear, and no injection.   Ears:  R ear normal, L ear normal, and no external deformities.   Nose:  no external deformity and no external erythema.   Mouth:  missing several teeth but has had post implants and is going to move ahead with prosthesis. No buccal lesions. Posterior pharynx is clear. Neck:  supple, full ROM, no thyromegaly, and no carotid bruits.   Chest Wall:  No deformities, masses, tenderness or gynecomastia noted. Lungs:  Normal respiratory effort, chest expands symmetrically. Lungs are clear to auscultation, no crackles or wheezes. Heart:  Normal rate and regular rhythm. S1 and S2 normal without gallop, murmur, click, rub or other extra sounds. Abdomen:  soft, non-tender, normal bowel sounds, no distention, no guarding, no rigidity, and no hepatomegaly.  Well healed surgical scar. Prostate:  deferred to normal PSA Msk:  normal ROM, no joint tenderness, no joint swelling, no redness over joints, and no joint instability.   Pulses:  2+ radial and DP pulses Extremities:  No clubbing, cyanosis, edema, or deformity noted with normal full range of motion of all joints.  Neurologic:  alert & oriented X3, cranial nerves II-XII intact, strength normal in all extremities, gait normal, and DTRs symmetrical and normal.   Skin:  turgor normal, color normal, no suspicious lesions, no ecchymoses, and no ulcerations.   Cervical Nodes:  no anterior cervical adenopathy and no posterior cervical adenopathy.   Psych:  Oriented X3, memory intact for recent and remote, normally interactive, and good eye contact.     Impression &  Recommendations:  Problem # 1:  ARTHROSCOPY, LEFT KNEE, HX OF (ICD-V45.89) Has made a fll recovery and is doing well with no mentioned limitations in activity.  Problem # 2:  BENIGN PROSTATIC HYPERTROPHY, HX OF (ICD-V13.8) No significant nocturia or daytime problems.  Problem # 3:  HEPATITIS C (ICD-070.51) Followed closely by Dr. Kinnie Scales. He is currently stable with recent evaluation as noted in HPI.  Problem # 4:  UNSPECIFIED CARDIAC DYSRHYTHMIA (ICD-427.9) Auscultation reveals frequent premature beats. EKG reveals frequent Premature Atrial Contractions (PACs). Event record obtained: revealed freqeunt PACs and PVCs with no malignant arrythmias. (study to be scanned into EMR). NST retrieved and reviewed. No evidence of ischemia reports (study to be scanned into EMR).  Plan - no need for further study           continue to use valsalva manuever as needed.           for unrelieved tachyrhythmia, especially if associated with symptoms such as shortness of breath or near-syncope, come to Primary               office or cardiology. If after hours report to [facility] ED - identify as Center Patient.  His updated medication list for this problem includes:    Aspir-low 81 Mg Tbec (Aspirin) .[name]... 1 by mouth once daily  Problem # 5:  Preventive Health Care (ICD-V70.0) No significant events or illness in interval history. Exam is normal. Labs are reveal minimal elevation in serum glucose. Previous years reveiwed and he is consistent in the same range: 118 -122. Recommend prudence with sugar and simple carbohydrates. Mild chronic elevation in LFTs. Lipids are OK. Current with colorectal cancer screening. Immunizations: Shingles vaccine '09, pneumonia vaccine at work (record requested), influenza given today. Routine Chest  x-ray with radiographic findings suggestive of chronic obstructive lung disease, however, no clinical signs of symptoms to suggest an active disease process. No evidence of TB   In  summary - a very nice man with chronic Hep C, non-malignant arrythmia who appears fit and in good health all things considered. He is counseled to increase his regular exercise regimen and to be cognizant of mild glucose metabolism defect. He will return as needed or in 1 year.   Complete Medication List: 1)  Prilosec 20 Mg Cpdr (Omeprazole) .... Take 1 tab by mouth at bedtime 2)  Multivitamins Tabs (Multiple vitamin) .... Take once daily 3)  Aspir-low 81 Mg Tbec (Aspirin) .[name].. 1 by mouth once daily 4)  Juice Plus Fibre Liqd (Nutritional supplements) .... Daily  Other Orders: Admin 1st Vaccine (28315) Flu Vaccine 41yrs + (17616) T-2 View CXR (71020TC)   Patient: Timothy Dickson Note: All result statuses are Final unless otherwise noted.  Tests: (1) Lipid Panel (LIPID)   Cholesterol               152 mg/dL                   0-737     ATP III Classification  Desirable:  < 200 mg/dL                    Borderline High:  200 - 239 mg/dL               High:  > = 240 mg/dL   Triglycerides             73.0 mg/dL                  9.1-478.2     Normal:  <150 mg/dL     Borderline High:  956 - 199 mg/dL   HDL                  [L]  21.30 mg/dL                 >86.57   VLDL Cholesterol          14.6 mg/dL                  8.4-69.6   LDL Cholesterol      [H]  295 mg/dL                   2-84  CHO/HDL Ratio:  CHD Risk                             5                    Men          Women     1/2 Average Risk     3.4          3.3     Average Risk          5.0          4.4     2X Average Risk          9.6          7.1     3X Average Risk          15.0          11.0                           Tests: (2) BMP (METABOL)   Sodium                    139 mEq/L                   135-145   Potassium                 4.6 mEq/L                   3.5-5.1   Chloride                  101 mEq/L                   96-112   Carbon Dioxide            29 mEq/L                    19-32   Glucose               [H]  122 mg/dL  70-99   BUN                       18 mg/dL                    8-46   Creatinine                1.2 mg/dL                   9.6-2.9   Calcium                   9.5 mg/dL                   5.2-84.1   GFR                       63.46 mL/min                >60  Tests: (3) CBC Platelet w/Diff (CBCD)   White Cell Count          6.2 K/uL                    4.5-10.5   Red Cell Count            4.58 Mil/uL                 4.22-5.81   Hemoglobin                15.1 g/dL                   32.4-40.1   Hematocrit                43.7 %                      39.0-52.0   MCV                       95.4 fl                     78.0-100.0   MCHC                      34.5 g/dL                   02.7-25.3   RDW                       13.6 %                      11.5-14.6   Platelet Count            203.0 K/uL                  150.0-400.0   Neutrophil %              60.6 %                      43.0-77.0   Lymphocyte %              23.1 %                      12.0-46.0   Monocyte %           [  H]  12.8 %                      3.0-12.0   Eosinophils%              3.1 %                       0.0-5.0   Basophils %               0.4 %                       0.0-3.0   Neutrophill Absolute      3.7 K/uL                    1.4-7.7   Lymphocyte Absolute       1.4 K/uL                    0.7-4.0   Monocyte Absolute         0.8 K/uL                    0.1-1.0  Eosinophils, Absolute                             0.2 K/uL                    0.0-0.7   Basophils Absolute        0.0 K/uL                    0.0-0.1  Tests: (4) Hepatic/Liver Function Panel (HEPATIC)   Total Bilirubin           1.0 mg/dL                   1.6-1.0   Direct Bilirubin          0.2 mg/dL                   9.6-0.4   Alkaline Phosphatase      39 U/L                      39-117   AST                  [H]  69 U/L                      0-37   ALT                  [H]  81 U/L                      0-53   Total Protein              7.1 g/dL                    5.4-0.9   Albumin                   4.3 g/dL                    8.1-1.9  Tests: (5) TSH (TSH)   FastTSH  1.30 uIU/mL                 0.35-5.50  Tests: (6) Prostate Specific Antigen (PSA)   PSA-Hyb                   0.45 ng/mL                  0.10-4.00  Tests: (7) UDip w/Micro (URINE)   Color                     LT. YELLOW       RANGE:  Yellow;Lt. Yellow   Clarity                   CLEAR                       Clear   Specific Gravity          <=1.005                     1.000 - 1.030   Urine Ph                  7.0                         5.0-8.0   Protein                   NEGATIVE                    Negative   Urine Glucose             NEGATIVE                    Negative   Ketones                   NEGATIVE                    Negative   Urine Bilirubin           NEGATIVE                    Negative   Blood                     NEGATIVE                    Negative   Urobilinogen              0.2                         0.0 - 1.0   Leukocyte Esterace        TRACE                       Negative   Nitrite                   NEGATIVE                    Negative   Urine WBC                 0-2/hpf                     0-2/hpf  CHEST 2 VIEW - [id number]   Clinical Data: Renal cell cancer.  History of pneumonia.   CHEST - 2 VIEW   Comparison: 04/20/2009   Findings: Heart size is normal and the vascularity is normal. Lungs are clear without infiltrate or effusion.  There is pulmonary hyperinflation with changes of COPD.   IMPRESSION: COPD.  No acute cardiopulmonary disease.   Read By:  Camelia Phenes,  M.D.    Flu Vaccine Consent Questions     Do you have a history of severe allergic reactions to this vaccine? no    Any prior history of allergic reactions to egg and/or gelatin? no    Do you have a sensitivity to the preservative Thimersol? no    Do you have a past history of Guillan-Barre Syndrome? no    Do you currently have an  acute febrile illness? no    Have you ever had a severe reaction to latex? no    Vaccine information given and explained to patient? yes    Are you currently pregnant? no    Lot Number:[id number]   Exp Date:01/29/2011   Site Given  Left Deltoid IMflu

## 2010-08-31 NOTE — Consult Note (Signed)
Summary: [facility]  [facility]   Imported By: Esmeralda Links D'jimraou 11/28/2007 11:03:44  _____________________________________________________________________  External Attachment:    Type:   Image     Comment:   External Document

## 2010-08-31 NOTE — Progress Notes (Signed)
Summary: heart monitor  Phone Note Call from Patient Call back at Work Phone [phone]   Summary of Call: Pt was seen in ER on Monday morning for increased HR. Dr @ ER told pt that he spoke with Dr Debby Bud and we would call him to set up a heart monitor. Pt has not heard from our office. Were you aware of this? Initial call taken by: Lamar Sprinkles,  July 04, 2007 1:42 PM  Follow-up for Phone Call        yes I was and meant to do this yesterday. Will send order to [facility] Follow-up by: Jacques Navy MD,  July 04, 2007 3:37 PM  Additional Follow-up for Phone Call Additional follow up Details #1::        lf mess w/pt to call if needed, order in and someone will contact pt Additional Follow-up by: Lamar Sprinkles,  July 04, 2007 4:29 PM  New Problems: UNSPECIFIED CARDIAC DYSRHYTHMIA (ICD-427.9)   New Problems: UNSPECIFIED CARDIAC DYSRHYTHMIA (ICD-427.9)

## 2010-08-31 NOTE — Letter (Signed)
Summary: [facility]  [facility]   Imported By: Sherian Rein 12/09/2009 09:21:56  _____________________________________________________________________  External Attachment:    Type:   Image     Comment:   External Document

## 2010-08-31 NOTE — Assessment & Plan Note (Signed)
Summary: PHYSICAL-$50-STC   Vital Signs:  Patient Profile:   65 Years Old Male Height:     73 inches Weight:      209 pounds Temp:     97.1 degrees F oral Pulse rate:   60 / minute BP sitting:   132 / 78  (left arm) Cuff size:   regular  Vitals Entered By: Zackery Barefoot CMA (April 17, 2008 11:01 AM)                 PCP:  Jacques Navy MD  Chief Complaint:  CPX/flu inj/? shingles inj.  History of Present Illness: Patient presents for routine physical exam. In the interval since his last visit he had GI symptoms. He came to full exam including EGD and colonoscopy which were normal except for two hyperplastic polyps.     Updated Prior Medication List: PRILOSEC 20 MG CPDR (OMEPRAZOLE) Take 1 tab by mouth at bedtime MULTIVITAMINS   TABS (MULTIPLE VITAMIN) Take once daily GLUCOSAMINE 1500 COMPLEX  CAPS (GLUCOSAMINE-CHONDROIT-VIT C-MN) Take 1 tablet by mouth two times a day  Current Allergies: No known allergies   Past Medical History:    Reviewed history from 10/26/2007 and no changes required:       Hx of OSTEOARTHRITIS, KNEE (ICD-715.96)       Hx of DIVERTICULOSIS, COLON (ICD-562.10)       * SHORTENED RIGHT LEG CAUSES SOME BACK PAIN.       Hx of PNEUMONIA (ICD-486)       Hx of SUBSTANCE ABUSE (ICD-305.90)       Hx of CARCINOMA, RENAL CELL (ICD-189.0)       HYPERGLYCEMIA (ICD-790.29)       BENIGN PROSTATIC HYPERTROPHY, HX OF (ICD-V13.8)       HEPATITIS C (ICD-070.51)       UNSPECIFIED CARDIAC DYSRHYTHMIA (ICD-427.9)          Past Surgical History:    Reviewed history from 10/26/2007 and no changes required:       ARTHROSCOPY, LEFT KNEE, HX OF (ICD-V45.89)       * LEFT SHOULDER REPAIR  FOR BONE SPUR.       NEEDLE BIOPSY, LIVER, HX OF (ICD-V15.89)       * TRANSURETHRAL NEEDLE ABLATION PROCEDURE.       * TUNA PROCEDURE       NEPHRECTOMY, HX OF (ICD-V45.73)    Risk Factors:  Caffeine use:  5+ drinks per day Alcohol use:  no Exercise:  yes    Times  per week:  3    Type:  cross-trainer. Seatbelt use:  100 %  Colonoscopy History:     Date of Last Colonoscopy:  11/15/2007    Results:  Hyperplastic Polyp    Review of Systems  The patient denies anorexia, fever, weight loss, weight gain, vision loss, decreased hearing, hoarseness, chest pain, syncope, dyspnea on exertion, peripheral edema, prolonged cough, headaches, hemoptysis, abdominal pain, melena, hematochezia, severe indigestion/heartburn, hematuria, incontinence, genital sores, muscle weakness, suspicious skin lesions, transient blindness, difficulty walking, depression, unusual weight change, abnormal bleeding, enlarged lymph nodes, angioedema, and testicular masses.     Physical Exam  General:     Well-developed,well-nourished,in no acute distress; alert,appropriate and cooperative throughout examination Head:     Normocephalic and atraumatic without obvious abnormalities. No apparent alopecia or balding. Eyes:     No corneal or conjunctival inflammation noted. EOMI. Perrla. Funduscopic exam benign, without hemorrhages, exudates or papilledema. Vision grossly normal. Ears:     External  ear exam shows no significant lesions or deformities.  Otoscopic examination reveals clear canals, tympanic membranes are intact bilaterally without bulging, retraction, inflammation or discharge. Hearing is grossly normal bilaterally. Nose:     no external deformity.   Mouth:     good dentition, no gingival abnormalities, pharynx pink and moist, no exudates, no posterior lymphoid hypertrophy, and no pharyngeal crowing.   Neck:     No deformities, masses, or tenderness noted. Chest Wall:     No deformities, masses, tenderness or gynecomastia noted. Lungs:     normal respiratory effort, normal breath sounds, and no wheezes.   Heart:     normal rate, regular rhythm, no murmur, no gallop, and no HJR.   Abdomen:     soft, non-tender, normal bowel sounds, no distention, no masses, and no  hepatomegaly.   Rectal:     deferred to exam at Colonoscopy Msk:     normal ROM, no joint tenderness, no joint swelling, no joint deformities, and no crepitation.   Pulses:     R and L carotid,radial,femoral,dorsalis pedis and posterior tibial pulses are full and equal bilaterally Extremities:     trace left pedal edema.   Neurologic:     alert & oriented X3, cranial nerves II-XII intact, strength normal in all extremities, gait normal, and DTRs symmetrical and normal.   Skin:     turgor normal, color normal, no rashes, no ecchymoses, and no ulcerations.   Cervical Nodes:     no anterior cervical adenopathy and no posterior cervical adenopathy.   Axillary Nodes:     no R axillary adenopathy and no L axillary adenopathy.   Psych:     Oriented X3, memory intact for recent and remote, normally interactive, and good eye contact.      Impression & Recommendations:  Problem # 1:  HYPERGLYCEMIA (ICD-790.29) Minimal elevation in serum glucose noted. discussed with the patient and directed life-style management with limited carbs and continued exercise.   Plan: repeat labs on routine schedule to include A1C.   Problem # 2:  BENIGN PROSTATIC HYPERTROPHY, HX OF (ICD-V13.8) stable with no significant nocturia. No indication for any medical therapy at this time.  Problem # 3:  HEPATITIS C (ICD-070.51) followed closely by Dr. Kinnie Scales and doing well.  Problem # 4:  Preventive Health Care (ICD-V70.0) Current with colorectal cancer screening and current with GU evaluation, including prostate exam.  In summary: a healthy appearing gentleman, medically stable. He will return as needed or 1 year.  Complete Medication List: 1)  Prilosec 20 Mg Cpdr (Omeprazole) .... Take 1 tab by mouth at bedtime 2)  Multivitamins Tabs (Multiple vitamin) .... Take once daily 3)  Glucosamine 1500 Complex Caps (Glucosamine-chondroit-vit c-mn) .... Take 1 tablet by mouth two times a day  Other Orders: T-2 View  CXR (71020TC) Flu Vaccine 26yrs + (16109) Admin of Therapeutic Inj (IM or [place]) (60454) Zoster (Shingles) Vaccine Live (09811) Admin 1st Vaccine (91478) Admin of Any Addtl Vaccine (29562)    ] Tests: (1) LIPID PROFILE (LIPID)   CHOLESTEROL               116 mg/dL                   1-308       ATP III Classification:             < 200       mg/dL      Desireable  200 - 239    mg/dL      Borderline High            > = 240      mg/dL      High   TRIGLYCERIDES             56 mg/dL                    2-595        Normal:  < 150 mg/dL        Borderline High:  150 - 199 mg/dL   HDL                  [L]  63.8 mg/dL                  >75.6   VLDL CHOLESTEROL          11 mg/dl                    4-33   LDL CHOLESTEROL           80 mg/dl                    2-95  CHOL/HDL Ratio: CHD Risk                             4.7 CALC  Tests: (2) URINE DIPTICK (UDIP)   COLOR                     YELLOW                      YELLOW   CLARITY                   Clear                       CLEAR   SP.GRAVITY                1.020                       1.000-1.03   URINE pH                  6.0                         5.0 - 8.0   PROTEIN                   Negative                    NEGATIVE   URINE GLUCOSE             NEGATIVE                    NEGATIVE   KETONES                   NEGATIVE                    NEGATIVE   URINE BILIRUBIN           NEGATIVE                    NEGATIVE   BLOOD  NEGATIVE                    NEGATIVE   UROBILINOGEN              0.2 mg/dL                   0.9-8.1   LEUKOCYTE ESTERACE        Negative                    NEGATIVE   NITRITE                   Negative                    NEGATIVE  Tests: (3) CBC WITH DIFF (CBCD)   WHITE CELL COUNT          5.4 K/uL                    4.5-10.5   RED CELL COUNT            4.53 Mil/uL                 4.22-5.81   HEMOGLOBIN                14.9 g/dL                   19.1-47.8   HEMATOCRIT                42.6 %                       39.0-52.0   MCV                       93.8 fl                     78.0-100.0   MCHC                      35.0 g/dL                   29.5-62.1   RDW                       12.7 %                      11.5-14.6   PLATELET COUNT            192 K/uL                    150-400   NEUTROPHIL %              58.5 %                      43.0-77.0   LYMPHOCYTE %              25.4 %                      12.0-46.0   MONOCYTE %                11.4 %                      3.0-12.0   EOSINOPHILS %  3.4 %                       0.0-5.0   BASOPHILS %               1.3 %                       0.0-3.0  NEUTROPHILS, ABSOLUTE                             3.1 K/uL                    1.4-7.7   MONOCYTES, ABSOLUTE       0.6 K/uL                    0.1-1.0  EOSINOPHILS, ABSOLUTE                             0.2 K/uL                    0.0-0.7   BASOPHILS, ABSOLUTE       0.1 K/uL                    0.0-0.1  Tests: (4) BASIC METABOLIC PANEL (METABOL)   SODIUM                    140 mEq/L                   135-145   POTASSIUM                 3.9 mEq/L                   3.5-5.1   CHLORIDE                  106 mEq/L                   96-112   CARBON DIOXIDE            30 mEq/L                    19-32   GLUCOSE              [H]  117 mg/dL                   04-54   BUN                       15 mg/dL                    0-98   CREATININE                1.1 mg/dL                   1.1-9.1   CALCIUM                   9.0 mg/dL                   4.7-82.9  GFR (AFRICAN AMERICAN)  87 mL/min  GFR (NON-AFRICAN AMERICAN)                             72 mL/min  Tests: (5) HEPATIC FUNCTION PANEL (HEPATIC)   TOTAL BILIRUBIN           0.8 mg/dL                   9.1-4.7   DIRECT BILIRUBIN          0.2 mg/dL                   8.2-9.5   ALKALINE PHOSPHATASE      44 U/L                      39-117   SGOT (AST)           [H]  56 U/L                      0-37   SGPT (ALT)            [H]  80 U/L                      0-53   TOTAL PROTEIN             7.0 g/dL                    6.2-1.3   ALBUMIN                   4.1 g/dL                    0.8-6.5  Tests: (6) PSA_Hyb (PSA)   PSA_Hyb                   0.37 ng/mL                  0.10-4.00  Tests: (7) FastTSH (TSH)   FastTSH                   1.42 uIU/mL                 0.35-5.50  DG CHEST 2 VIEW - [id number]   Clinical Data: Health maintenance.   CHEST - 2 VIEW 04/17/2008:   Comparison: Two-view chest x-ray 07/02/2007, 01/22/2007, and 11/25/2003.   Findings: Heart size normal and stable.  Thoracic aorta minimally tortuous but unchanged.  Hilar and mediastinal contours otherwise unremarkable.  Lungs clear.  No pleural effusions.  Mild degenerative changes in the mid and lower thoracic spine.  No significant interval change.   IMPRESSION: No acute cardiopulmonary disease.  Stable dating back to April, 2005.   Read By:  Arnell Sieving,  M.D.       Flu Vaccine Consent Questions     Do you have a history of severe allergic reactions to this vaccine? no    Any prior history of allergic reactions to egg and/or gelatin? no    Do you have a sensitivity to the preservative Thimersol? no    Do you have a past history of Guillan-Barre Syndrome? no    Do you currently have an acute febrile illness? no    Have you ever had a severe reaction to latex? no    Vaccine information given and explained to patient? yes  Are you currently pregnant? no    Lot Number:[id number]   Site Given  Right Deltoid IM  Zostavax # 1    Vaccine Type: Zostavax    Site: left deltoid    Mfr: Merck    Dose: 0.3ml    Route: [place]    Given by: Zackery Barefoot CMA    Exp. Date: 06/23/2009    Lot #: [id number]    VIS given: 05/13/05 given April 17, 2008.

## 2010-08-31 NOTE — Letter (Signed)
[facility]   Imported By: Lester [place] 03/31/2009 09:52:45  _____________________________________________________________________  External Attachment:    Type:   Image     Comment:   External Document

## 2010-08-31 NOTE — Consult Note (Signed)
Summary: [facility]   Imported By: Maryln Gottron 10/16/2007 14:30:04  _____________________________________________________________________  External Attachment:    Type:   Image     Comment:   External Document

## 2010-08-31 NOTE — Progress Notes (Signed)
Summary: Apt today  Phone Note Call from Patient   Summary of Call: Pt says that last June pt was out in yard and became extremely dizzy. Says that this lasted about 6 to 8 hours. Pt woke up last night and became dizzy. Ok per Dr work in this am  Initial call taken by: Lamar Sprinkles,  April 24, 2008 9:49 AM

## 2010-08-31 NOTE — Progress Notes (Signed)
Summary: Acid Reflux  Phone Note Call from Patient Call back at Home Phone [phone] Call back at Work Phone [phone]   Summary of Call: Pt went to urgent care @ [facility] yesterday due to nausea and increased acid reflux. He has advised to take prilosec and scheduled for an u/s at [facility] today. Pt's reflux has not gotten better and he had trouble sleeping. He has also tried pepcid & gaviscon in addition to the pepcid.  He also says that his stools are white this am. Pt called b/c he wanted Dr Debby Bud to take over from here. I advised pt to keep the ultrasound apt and have the results to be sent for Dr Debby Bud review. Pt is aware that Dr will not be back until Thursday.  Initial call taken by: Lamar Sprinkles,  September 18, 2007 9:49 AM  Follow-up for Phone Call        OK for U/S. Continue prilosec OTC 2 (two) every AM, may also take either pepcid or liguid antacid in PM. He should elevate the head on two pillow or put 4 inch blocks under the head of the bed. Add on to my schedule for Thursday. Follow-up by: Jacques Navy MD,  September 18, 2007 3:47 PM  Additional Follow-up for Phone Call Additional follow up Details #1::        Pt informed. Pt says that he was told the u/s was neg. He was referred to Dr [place]era (?) for GI consult. He does not have an apt yet. I advised if he could not get in soon enough to see Dr Debby Bud. Additional Follow-up by: Lamar Sprinkles,  September 18, 2007 6:28 PM    Additional Follow-up for Phone Call Additional follow up Details #2::    Is Mr. Kedzierski on my Thursday schedule? If not I am still willing to work  him in. Follow-up by: Jacques Navy MD,  September 19, 2007 9:32 PM  Additional Follow-up for Phone Call Additional follow up Details #3:: Details for Additional Follow-up Action Taken: When I spoke with him he was ok to wait until GI referral but will call if changes his mind. ..................................................................Marland KitchenLamar Sprinkles  September 20, 2007 8:13 AM

## 2010-08-31 NOTE — Assessment & Plan Note (Signed)
Summary: dizzy SD   Vital Signs:  Patient Profile:   65 Years Old Male Height:     73 inches Weight:      209 pounds Temp:     97.8 degrees F oral Pulse rate:   58 / minute BP sitting:   122 / 88  (left arm) Cuff size:   regular  Vitals Entered By: Sydnee Levans, SMA                 PCP:  Jacques Navy MD  Chief Complaint:  dizzy.  History of Present Illness: Last july had the onset of dizzyness - room spinning, along with N/V. It passede by ten that night.  Last night got up 0300 to urinate. Had no balance, had to hold on to various objects to balance. Had some najusea last night but no vomiting. He was able to return to sleep but still has some dizziness this AM. Has always had a problem with motion causing dizziness.    Current Allergies: No known allergies      Review of Systems  The patient denies anorexia, fever, weight loss, weight gain, decreased hearing, dyspnea on exertion, headaches, abdominal pain, hematochezia, suspicious skin lesions, and transient blindness.     Physical Exam  General:     Well-developed,well-nourished,in no acute distress; alert,appropriate and cooperative throughout examination Head:     normocephalic and atraumatic.   Eyes:     vision grossly intact, pupils round, corneas and lenses clear, and no injection.   Mouth:     pharynx pink and moist.   Lungs:     normal respiratory effort.   Heart:     normal rate.   Neurologic:     alert & oriented X3, cranial nerves II-XII intact, strength normal in all extremities, DTRs symmetrical and normal, finger-to-nose normal, and heel-to-shin normal.  No dysdiadochokinesia. Ataxic. No nystagmus.    Impression & Recommendations:  Problem # 1:  LABYRINTHITIS, ACUTE (ICD-386.30) based on symptoms and normal neurologic exam favor labyrinthitis as diagnosis.  Plan: otc meclizine 12.5 -25 mg q 6 as needed         for persistent or worsening symptoms, new neuro symptoms, e.g.   diploplia, focal weakness, other - will move to MRI.  Complete Medication List: 1)  Prilosec 20 Mg Cpdr (Omeprazole) .... Take 1 tab by mouth at bedtime 2)  Multivitamins Tabs (Multiple vitamin) .... Take once daily 3)  Glucosamine 1500 Complex Caps (Glucosamine-chondroit-vit c-mn) .... Take 1 tablet by mouth two times a day    ]

## 2010-08-31 NOTE — Procedures (Signed)
Summary: Allen Norris MD  [facility]   Imported By: Lester Madill 04/12/2010 10:14:47  _____________________________________________________________________  External Attachment:    Type:   Image     Comment:   External Document

## 2010-08-31 NOTE — Letter (Signed)
Summary: [facility]  [facility]   Imported By: Esmeralda Links D'jimraou 08/14/2008 10:21:35  _____________________________________________________________________  External Attachment:    Type:   Image     Comment:   External Document

## 2010-10-29 ENCOUNTER — Telehealth: Payer: Self-pay | Admitting: Internal Medicine

## 2010-10-29 NOTE — Telephone Encounter (Signed)
Pt states that he cut himself shaving [w/electric razor] a couple of weeks ago and the bleeding clotted normally.The patient states that he cut himself again this morning w/the same razor and he cannot get the bleeding to stop. Has tried ice and "other remedies" to no avail; is soaking through every bandaid placed on cut. Do you think he needs to come in to be cauterized? Please Advise.

## 2010-10-29 NOTE — Telephone Encounter (Signed)
Per Vo Dr Debby Bud, Attempted to call Pt back but had to leave messages at both work #given & hm/mb# in chart to come to office for cauterization of cut.

## 2010-12-14 NOTE — Assessment & Plan Note (Signed)
[facility]                           PRIMARY CARE OFFICE NOTE   NAME:Timothy Dickson, Timothy Dickson                       MRN:          [id number]  DATE:01/22/2007                            DOB:          Apr 22, 1946    [name] is a delightful 65 year old gentleman who presents for annual  followup evaluation and exam.  He was last seen January 17, 2006.  Please  see that dictation for complete past medical history, family history,  and social history.   INTERVAL:  1. GI.  The patient has been seen and evaluated by Dr. 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      This led to a repeat liver biopsy August of 2007, which returned      with good result indicating that the patient had resolution of any      inflammation or active infection with minimal resultant scarring.  2. Musculoskeletal.  The patient's last left knee arthroscopic surgery      September of 2007 with excellent results by Dr. Charlann Boxer in regards to      knee pain and discomfort.  The patient otherwise has remained      medically stable.   CURRENT MEDICATIONS:  1. Pepcid 1 p.o. daily.  2. Multivitamins daily.   CHART REVIEW:  The patient's last colonoscopy was October 25, 2002 by Dr.  Arlyce Dice.  A copy of this report will be provided to the patient to send  to Dr. Kinnie Scales.  The patient did have a stress Cardiolite study Dec 12, 2003, which was negative for any evidence of ischemia or infarction with  a normal ejection fraction noted.  Last chest x-ray January 17, 2006 with  no active pulmonary disease.   REVIEW OF SYSTEMS:  No fevers, sweats, or chills.  The patient has had a  2 pound weight gain.  Ophthalm is unremarkable with an exam in the last  12 months.  The patient has had some dental repair with 2 crowns in the  last year.  The patient reports he has rare palpitations, which are  asymptomatic and otherwise unremarkable.  No respiratory complaints.  He  has  occasional heartburn, particularly if he does not take his Pepcid.  The patient does have nocturia x2.  No ED.  The patient has occasional  right hip and flank pain.  Marked improvement in terms of his left knee.   PHYSICAL EXAM:  Temperature was 97.7, blood pressure 131/76, pulse 72,  weight 209.  GENERAL APPEARANCE:  This is a well-nourished, well-developed Caucasian  gentleman who looks his stated age in no acute distress.  HEENT:  Normocephalic, atraumatic.  EACs and TMs unremarkable.  Oropharynx with native dentition in good repair.  No buccal or palatal  lesions were noted.  Posterior pharynx was clear.  Conjunctivae, sclerae  clear.  PERRLA.  EOMI.  Funduscopic exam was unremarkable.  NECK:  Supple without thyromegaly.  NODES:  No adenopathy was noted to the cervical or supraclavicular  regions.  CHEST:  No CVA tenderness.  LUNGS:  Clear to auscultation and percussion.  CARDIOVASCULAR:  2+ radial pulses.  No JVD.  No carotid bruits.  He had  a quiet precordium with regular rate and rhythm without murmurs, rubs,  or gallops.  ABDOMEN:  Soft with positive bowel sounds in all 4 quadrants.  He has a  large surgical scar in the right upper quadrant.  There is no organo-  splenomegaly noted on exam.  GENITALIA:  Normal male phallus.  Bilaterally distended testicles  without masses.  RECTAL:  Normal sphincter tone was noted.  Prostate was smooth, round,  normal in size and contour without nodules.  EXTREMITIES:  Without cyanosis, clubbing, or edema.  No deformities were  noted.  NEUROLOGIC:  Nonfocal.   DATABASE:  Hemoglobin 14.1 g, white count 7100 with a normal  differential.  Chemistries were unremarkable except for a serum glucose  of 120, SGOT elevated at 55, SGPT elevated at 70.  Renal function was  normal with a creatinine of 1.1 and a GFR of 72 mL per minute.  Lipid  panel with a cholesterol of 123, triglycerides of 67, HDL 24.4, LDL was  85.  TSH was normal at 1.36.  PSA  was normal at 0.46.  Urinalysis was  negative.   PA and lateral chest x-ray revealed no active pulmonary disease.   ASSESSMENT AND PLAN:  1. Gastrointestinal.  The patient is followed by Dr. Kinnie Scales for      hepatitis C, doing well at this time, with no evidence of active      disease.  He will follow up with Dr. Kinnie Scales as instructed.  2. Musculoskeletal.  The patient with recent arthroscopic surgery of      the knee, doing well.  His other orthopedic problems, including      shoulder discomfort, are stable.  3. Benign prostatic hypertrophy.  The patient is basically      asymptomatic.  He is status post TUNA procedure.  He has seen Dr.      McDiarmid for routine followup.  4. History of nephrectomy.  The patient with normal renal function.  5. Hyperglycemia.  The patient has had an elevated serum glucose of      120 two years in a row.  He did have a hemoglobin A1c in 2004 that      was normal.  Given the patient's stable glucose, would not at this      point pursue further testing, but have the patient follow a no-      concentrated sweets, low carbohydrate diet only.  6. Lipids.  The patient's LDL is excellent.  HDL is low at 24.4.  Plan      is lifestyle management with increased aerobic exercise and      carbohydrate modified diet.  7. Health maintenance.  The patient is currently up to date with      colorectal cancer screening and would be due for a followup      colonoscopy in 2009.  The patient has had pneumonia vaccine in      1999.  He would be a candidate for Zostavax given that he is over      60.  I do not believe there is contraindication in the presence of      hepatitis C.  8. The patient is asked to return to see me on a p.r.n. basis or in 1      year.     Rosalyn Gess Norins, MD  Electronically Signed  MEN/MedQ  DD: 01/23/2007  DT: 01/23/2007  Job #: [id number]   cc:   Diona Browner, M.D.  Leighton Roach McDiarmid, M.D.

## 2010-12-29 ENCOUNTER — Encounter: Payer: Self-pay | Admitting: Internal Medicine

## 2010-12-29 ENCOUNTER — Ambulatory Visit (INDEPENDENT_AMBULATORY_CARE_PROVIDER_SITE_OTHER): Payer: BC Managed Care – PPO | Admitting: Internal Medicine

## 2010-12-29 VITALS — BP 108/68 | HR 68 | Temp 98.4°F | Wt 208.0 lb

## 2010-12-29 DIAGNOSIS — R58 Hemorrhage, not elsewhere classified: Secondary | ICD-10-CM

## 2010-12-30 NOTE — Progress Notes (Signed)
  Subjective:    Patient ID: Timothy Dickson, male    DOB: 1946-06-27, 65 y.o.   MRN: [id number]  HPI Mr. Buenger presents for a problem with a persistently bleeding spot on his chin. He nicked himself shaving and now he has recurrent bleeding every time the eschar comes off this wound. He has no h/o bleeding disorder.   I have reviewed the patient's medical history in detail and updated the computerized patient record.    Review of Systems  Constitutional: Negative.   HENT: Negative.   Eyes: Negative.   Respiratory: Negative.   Cardiovascular: Negative.   Hematological: Does not bruise/bleed easily.       Objective:   Physical Exam Vitals reviewed Gen'l - well nourished well developed white male in no distress Derm - chin with small wound to right side with fresh eschar, approx 2mm. With a hand lens the area does not appear to be a malignant lesion, i.e. Melanoma, squamous cell carcinoma or basal cell carcinoma.  Procedure - cauterization of small arteriole to control bleeding Permit - verbal permission granted after explanation of risk of infection or bleeding reviewed. Anesth - infiltration of area with 2% xylocain with epi. Cautery - used hyrecator at 10 for cautery. No complications. Bleeding stopped.        Assessment & Plan:  1. Bleeding - most likely a small arteriole. Control obtained with cautery. Patient given routine precautions. He is to return if the bleeding resumes.

## 2011-03-17 ENCOUNTER — Ambulatory Visit: Payer: BC Managed Care – PPO

## 2011-03-17 DIAGNOSIS — Z0389 Encounter for observation for other suspected diseases and conditions ruled out: Secondary | ICD-10-CM

## 2011-03-17 DIAGNOSIS — Z0000 Encounter for general adult medical examination without abnormal findings: Secondary | ICD-10-CM

## 2011-03-17 LAB — URINALYSIS
Bilirubin Urine: NEGATIVE
Hgb urine dipstick: NEGATIVE
Ketones, ur: NEGATIVE
Leukocytes, UA: NEGATIVE
Nitrite: NEGATIVE
Specific Gravity, Urine: 1.01 (ref 1.000–1.030)
Total Protein, Urine: NEGATIVE
Urine Glucose: NEGATIVE
Urobilinogen, UA: 0.2 (ref 0.0–1.0)
pH: 7 (ref 5.0–8.0)

## 2011-03-17 LAB — CBC WITH DIFFERENTIAL/PLATELET
Basophils Absolute: 0 10*3/uL (ref 0.0–0.1)
Basophils Relative: 0.4 % (ref 0.0–3.0)
Eosinophils Absolute: 0.1 10*3/uL (ref 0.0–0.7)
Eosinophils Relative: 1.5 % (ref 0.0–5.0)
HCT: 43.8 % (ref 39.0–52.0)
Hemoglobin: 14.7 g/dL (ref 13.0–17.0)
Lymphocytes Relative: 18.6 % (ref 12.0–46.0)
Lymphs Abs: 1.4 10*3/uL (ref 0.7–4.0)
MCHC: 33.5 g/dL (ref 30.0–36.0)
MCV: 94.1 fl (ref 78.0–100.0)
Monocytes Absolute: 0.8 10*3/uL (ref 0.1–1.0)
Monocytes Relative: 10.7 % (ref 3.0–12.0)
Neutro Abs: 5.3 10*3/uL (ref 1.4–7.7)
Neutrophils Relative %: 68.8 % (ref 43.0–77.0)
Platelets: 186 10*3/uL (ref 150.0–400.0)
RBC: 4.65 Mil/uL (ref 4.22–5.81)
RDW: 13.9 % (ref 11.5–14.6)
WBC: 7.7 10*3/uL (ref 4.5–10.5)

## 2011-03-17 LAB — BASIC METABOLIC PANEL
BUN: 17 mg/dL (ref 6–23)
CO2: 26 mEq/L (ref 19–32)
Calcium: 9.2 mg/dL (ref 8.4–10.5)
Chloride: 100 mEq/L (ref 96–112)
Creatinine, Ser: 1.1 mg/dL (ref 0.4–1.5)
GFR: 72.83 mL/min (ref >60.00)
Glucose, Bld: 129 mg/dL — ABNORMAL HIGH (ref 70–99)
Potassium: 4.4 mEq/L (ref 3.5–5.1)
Sodium: 135 mEq/L (ref 135–145)

## 2011-03-17 LAB — PSA: PSA: 0.7 ng/mL (ref 0.10–4.00)

## 2011-03-17 LAB — LIPID PANEL
Cholesterol: 137 mg/dL (ref 0–200)
HDL: 37.2 mg/dL — ABNORMAL LOW (ref >39.00)
LDL Cholesterol: 90 mg/dL (ref 0–99)
Total CHOL/HDL Ratio: 4
Triglycerides: 49 mg/dL (ref 0.0–149.0)
VLDL: 9.8 mg/dL (ref 0.0–40.0)

## 2011-03-17 LAB — TSH: TSH: 0.9 u[IU]/mL (ref 0.35–5.50)

## 2011-03-17 LAB — HEPATIC FUNCTION PANEL
ALT: 101 U/L — ABNORMAL HIGH (ref 0–53)
AST: 73 U/L — ABNORMAL HIGH (ref 0–37)
Albumin: 4.4 g/dL (ref 3.5–5.2)
Alkaline Phosphatase: 50 U/L (ref 39–117)
Bilirubin, Direct: 0.2 mg/dL (ref 0.0–0.3)
Total Bilirubin: 0.6 mg/dL (ref 0.3–1.2)
Total Protein: 7.3 g/dL (ref 6.0–8.3)

## 2011-03-18 ENCOUNTER — Ambulatory Visit (INDEPENDENT_AMBULATORY_CARE_PROVIDER_SITE_OTHER)
Admission: RE | Admit: 2011-03-18 | Discharge: 2011-03-18 | Disposition: A | Discharge status: Home / Self Care | Payer: BC Managed Care – PPO | Source: Ambulatory Visit | Attending: Internal Medicine | Admitting: Internal Medicine

## 2011-03-18 ENCOUNTER — Encounter: Payer: Self-pay | Admitting: Internal Medicine

## 2011-03-18 ENCOUNTER — Ambulatory Visit (INDEPENDENT_AMBULATORY_CARE_PROVIDER_SITE_OTHER): Payer: BC Managed Care – PPO | Admitting: Internal Medicine

## 2011-03-18 VITALS — BP 122/86 | HR 75 | Temp 98.4°F | Wt 209.0 lb

## 2011-03-18 DIAGNOSIS — Z0000 Encounter for general adult medical examination without abnormal findings: Secondary | ICD-10-CM

## 2011-03-18 DIAGNOSIS — Z23 Encounter for immunization: Secondary | ICD-10-CM

## 2011-03-18 DIAGNOSIS — C649 Malignant neoplasm of unspecified kidney, except renal pelvis: Secondary | ICD-10-CM

## 2011-03-18 DIAGNOSIS — R7309 Other abnormal glucose: Secondary | ICD-10-CM

## 2011-03-18 DIAGNOSIS — B1710 Acute hepatitis C without hepatic coma: Secondary | ICD-10-CM

## 2011-03-18 MED ORDER — TETANUS-DIPHTH-ACELL PERTUSSIS 5-2.5-18.5 LF-MCG/0.5 IM SUSP: 0.5000 mL | Freq: Once | INTRAMUSCULAR | Status: DC

## 2011-03-18 NOTE — Progress Notes (Signed)
Subjective:    Patient ID: Timothy Dickson, male    DOB: [birth year], 65 y.o.   MRN: [id number]  HPI Timothy Dickson presents for routine general medical exam. He has been feeling well and reports no new medical illness, no injury or surgery in the interval since his last exam.  Past Medical History  Diagnosis Date  . Osteoarthritis, knee   . Diverticulosis of colon   . Pneumonia   . History of substance abuse   . Family history of renal cell carcinoma   . Hyperglycemia   . History of BPH   . 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 Cardiac dysrhythmia, unspecified    Past Surgical History  Procedure Date  . Arthroscopy, left knee,   . Left shoulder repair for bone spur   . History of liver biopsy   . Transurethral needle ablation procedure   . Tuna procedure   . History of nephrectomy   . Arthroscopy right knee     may '11 (Dr Charlann Boxer)   Family History  Problem Relation Age of Onset  . Coronary artery disease Mother   . Cancer Brother     Prostate cancer  . Alcohol abuse Other     Strong family Hx  . Diabetes Sister   . Cancer Sister     breast cancer, lung cancer, esophageal   History   Social History  . Marital Status: Married    Spouse Name: N/A    Number of Children: N/A  . Years of Education: N/A   Occupational History  . Not on file.   Social History Main Topics  . Smoking status: Former Games developer  . Smokeless tobacco: Never Used  . Alcohol Use: No  . Drug Use: No  . Sexually Active: Yes -- Male partner(s)   Other Topics Concern  . Not on file   Social History Narrative   [name] BA, [place] [employer], [place] [employer]. Married- '84. 2 step-daughters, 4 grandchildren. 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 Marriage in good health.       Review of Systems Review of Systems  Constitutional:  Negative for fever, chills, activity change and unexpected weight change.  HEENT:  Negative for hearing loss, ear  pain, congestion, neck stiffness and postnasal drip. Negative for sore throat or swallowing problems. Negative for dental complaints.   Eyes: Negative for vision loss or change in visual acuity.  Respiratory: Negative for chest tightness and wheezing.   Cardiovascular: Negative for chest pain and palpitation. No decreased exercise tolerance Gastrointestinal: No change in bowel habit. No bloating or gas. No reflux or indigestion Genitourinary: Negative for urgency, frequency, flank pain and difficulty urinating.  Musculoskeletal: Negative for myalgias, back pain, arthralgias and gait problem.  Neurological: Negative for dizziness, tremors, weakness and headaches.  Hematological: Negative for adenopathy.  Psychiatric/Behavioral: Negative for behavioral problems and dysphoric mood.       Objective:   Physical Exam Vital signs reviewed Gen'l: Well nourished well developed white male in no acute distress  HENT:  Head: Normocephalic and atraumatic.  Right Ear: External ear normal. EAC/TM nl Left Ear: External ear normal.  EAC/TM nl Nose: Nose normal.  Mouth/Throat: Oropharynx is clear and moist. Dentition - native, in good repair. No buccal or palatal lesions. Posterior pharynx clear. Eyes: Conjunctivae and sclera clear. EOM intact. Pupils are equal, round, and reactive to light. Right eye exhibits no discharge. Left eye exhibits no  discharge. Neck: Normal range of motion. Neck supple. No JVD present. No tracheal deviation present. No thyromegaly present.  Cardiovascular: Normal rate, regular rhythm, no gallop, no friction rub, no murmur heard.      Quiet precordium. 2+ radial and DP pulses . No carotid bruits Pulmonary/Chest: Effort normal. No respiratory distress or increased WOB, no wheezes, no rales. No chest wall deformity or CVAT. Abdominal: Soft. Bowel sounds are normal in all quadrants. He exhibits no distension, no tenderness, no rebound or guarding, No heptosplenomegaly    Genitourinary: deferred. Discussed prostate cancer screening.  Musculoskeletal: Normal range of motion. He exhibits no edema and no tenderness.       Small and large joints without redness, synovial thickening or deformity. Full range of motion preserved about all small, median and large joints.  Lymphadenopathy:    He has no cervical or supraclavicular adenopathy.  Neurological: He is alert and oriented to person, place, and time. CN II-XII intact. DTRs 2+ and symmetrical biceps, radial and patellar tendons. Cerebellar function normal with no tremor, rigidity, normal gait and station.  Skin: Skin is warm and dry. No rash noted. No erythema.  Psychiatric: He has a normal mood and affect. His behavior is normal. Thought content normal.   Lab Results  Component Value Date   WBC 7.7 03/17/2011   HGB 14.7 03/17/2011   HCT 43.8 03/17/2011   PLT 186.0 03/17/2011   CHOL 137 03/17/2011   TRIG 49.0 03/17/2011   HDL 37.20* 03/17/2011   ALT 101* 03/17/2011   AST 73* 03/17/2011   NA 135 03/17/2011   K 4.4 03/17/2011   CL 100 03/17/2011   CREATININE 1.1 03/17/2011   BUN 17 03/17/2011   CO2 26 03/17/2011   TSH 0.90 03/17/2011   PSA 0.70 03/17/2011   Lab Results  Component Value Date   LDLCALC 90 03/17/2011        Glucose                129                                                                  03/17/2011  Clinical Data: For physical exam, former smoker, history of renal  cell carcinoma  CHEST - 2 VIEW  Comparison: Chest x-ray of 04/29/2010  Findings: The lungs are clear but slightly hyperaerated. Mild  peribronchial thickening is noted consistent with bronchitis. No  metastatic involvement of the lungs is seen. Mediastinal contours  are stable. The heart is within normal limits in size. No bony  abnormality is seen.  IMPRESSION:  Hyperaeration and peribronchial thickening suggesting COPD and  bronchitis. No pneumonia.  Original Report Authenticated By: Juline Patch, M.D.           Assessment & Plan:

## 2011-03-21 ENCOUNTER — Encounter: Payer: Self-pay | Admitting: Internal Medicine

## 2011-03-21 DIAGNOSIS — Z0000 Encounter for general adult medical examination without abnormal findings: Secondary | ICD-10-CM | POA: Insufficient documentation

## 2011-03-21 NOTE — Assessment & Plan Note (Signed)
Fast  Blood sugar has been elevated: 122 at last visit, 129 this visit. No A1C on record.  Plan - At next lab draw he will need to remember to remind me to order A1C           Dietary management with a no/low sugar and low carb diet.           Regular exercise - at least 3 times a week

## 2011-03-21 NOTE — Assessment & Plan Note (Signed)
Doing great. Chest x-ray is normal

## 2011-03-21 NOTE — Assessment & Plan Note (Addendum)
Interval history is unremarkable. Physical exam is normal. Lab results with mild, chronic elevation of liver functions c/w active chronic hep C; mild elevation in glucose. He is current with colorectal cancer screening with last exam April '09. Prostate screening - normal PSA. Immunizations: Tdap - given today. Shingles vaccine Sept '09. Pneumonia vaccine - due if not already done at work - he will check.   In summary- a very nice man who appears to be medically stable. He is definitely a survivor. He is asked to return as needed or in 1 year. Copy of note to Dr. Kinnie Scales.

## 2011-03-21 NOTE — Assessment & Plan Note (Signed)
Followed closely by Dr. Kinnie Scales. He has had liver biopsy with minimal chronic change and inflammation. Viral load is positive and LFTs continue to be elevated.  Plan- he is waiting for improved therapies, which are definitely on the near horizon, and is open to considering treatment. Will defer to him and Dr. Kinnie Scales in this regard.

## 2011-04-15 ENCOUNTER — Other Ambulatory Visit (HOSPITAL_COMMUNITY): Payer: Self-pay | Admitting: Gastroenterology

## 2011-04-15 DIAGNOSIS — C220 Liver cell carcinoma: Secondary | ICD-10-CM

## 2011-04-19 ENCOUNTER — Ambulatory Visit (HOSPITAL_COMMUNITY)
Admission: RE | Admit: 2011-04-19 | Discharge: 2011-04-19 | Disposition: A | Discharge status: Home / Self Care | Payer: BC Managed Care – PPO | Source: Ambulatory Visit | Attending: Gastroenterology | Admitting: Gastroenterology

## 2011-04-19 DIAGNOSIS — C220 Liver cell carcinoma: Secondary | ICD-10-CM

## 2011-04-19 DIAGNOSIS — Z905 Acquired absence of kidney: Secondary | ICD-10-CM | POA: Insufficient documentation

## 2011-04-19 DIAGNOSIS — C649 Malignant neoplasm of unspecified kidney, except renal pelvis: Secondary | ICD-10-CM | POA: Insufficient documentation

## 2011-04-19 DIAGNOSIS — B1920 Unspecified viral hepatitis C without hepatic coma: Secondary | ICD-10-CM | POA: Insufficient documentation

## 2011-05-05 LAB — COMPREHENSIVE METABOLIC PANEL
ALT: 76 U/L — ABNORMAL HIGH (ref 0–53)
AST: 58 U/L — ABNORMAL HIGH (ref 0–37)
Albumin: 4.2 g/dL (ref 3.5–5.2)
Alkaline Phosphatase: 41 U/L (ref 39–117)
BUN: 20 mg/dL (ref 6–23)
CO2: 27 mEq/L (ref 19–32)
Calcium: 9 mg/dL (ref 8.4–10.5)
Chloride: 104 mEq/L (ref 96–112)
Creatinine, Ser: 1.04 mg/dL (ref 0.4–1.5)
GFR calc Af Amer: >60 mL/min (ref >60)
GFR calc non Af Amer: >60 mL/min (ref >60)
Glucose, Bld: 130 mg/dL — ABNORMAL HIGH (ref 70–99)
Potassium: 3.9 mEq/L (ref 3.5–5.1)
Sodium: 135 mEq/L (ref 135–145)
Total Bilirubin: 1 mg/dL (ref 0.3–1.2)
Total Protein: 7.4 g/dL (ref 6.0–8.3)

## 2011-05-05 LAB — PROTIME-INR
INR: 1 (ref 0.00–1.49)
Prothrombin Time: 13.7 seconds (ref 11.6–15.2)

## 2011-05-05 LAB — CBC
HCT: 43.7 % (ref 39.0–52.0)
Hemoglobin: 14.9 g/dL (ref 13.0–17.0)
MCHC: 34 g/dL (ref 30.0–36.0)
MCV: 94.2 fL (ref 78.0–100.0)
Platelets: 178 10*3/uL (ref 150–400)
RBC: 4.64 MIL/uL (ref 4.22–5.81)
RDW: 13.7 % (ref 11.5–15.5)
WBC: 6.4 10*3/uL (ref 4.0–10.5)

## 2011-11-01 ENCOUNTER — Telehealth: Payer: Self-pay | Admitting: *Deleted

## 2011-11-01 DIAGNOSIS — Z87898 Personal history of other specified conditions: Secondary | ICD-10-CM

## 2011-11-01 DIAGNOSIS — B1710 Acute hepatitis C without hepatic coma: Secondary | ICD-10-CM

## 2011-11-01 DIAGNOSIS — Z0000 Encounter for general adult medical examination without abnormal findings: Secondary | ICD-10-CM

## 2011-11-01 DIAGNOSIS — R7309 Other abnormal glucose: Secondary | ICD-10-CM

## 2011-11-01 NOTE — Telephone Encounter (Signed)
Pt requesting to have 04.08.13 OV [for elevated CBGs] converted to his physical [not due until 08.17.13, he is aware] and come in to have labs done this week prior, as he has not been 'feeling well'. Please advise.

## 2011-11-02 NOTE — Telephone Encounter (Signed)
K. Labs entered

## 2011-11-07 ENCOUNTER — Encounter: Payer: Self-pay | Admitting: Internal Medicine

## 2011-11-07 ENCOUNTER — Other Ambulatory Visit (INDEPENDENT_AMBULATORY_CARE_PROVIDER_SITE_OTHER): Payer: BC Managed Care – PPO

## 2011-11-07 ENCOUNTER — Ambulatory Visit (INDEPENDENT_AMBULATORY_CARE_PROVIDER_SITE_OTHER): Payer: BC Managed Care – PPO | Admitting: Internal Medicine

## 2011-11-07 VITALS — BP 118/84 | HR 66 | Temp 98.2°F | Resp 16 | Wt 206.0 lb

## 2011-11-07 DIAGNOSIS — Z0000 Encounter for general adult medical examination without abnormal findings: Secondary | ICD-10-CM

## 2011-11-07 DIAGNOSIS — R7309 Other abnormal glucose: Secondary | ICD-10-CM

## 2011-11-07 DIAGNOSIS — B1710 Acute hepatitis C without hepatic coma: Secondary | ICD-10-CM

## 2011-11-07 DIAGNOSIS — R634 Abnormal weight loss: Secondary | ICD-10-CM

## 2011-11-07 DIAGNOSIS — Z87898 Personal history of other specified conditions: Secondary | ICD-10-CM

## 2011-11-07 LAB — PSA: PSA: 0.49 ng/mL (ref 0.10–4.00)

## 2011-11-07 LAB — COMPREHENSIVE METABOLIC PANEL
ALT: 201 U/L — ABNORMAL HIGH (ref 0–53)
AST: 140 U/L — ABNORMAL HIGH (ref 0–37)
Albumin: 4.3 g/dL (ref 3.5–5.2)
Alkaline Phosphatase: 51 U/L (ref 39–117)
BUN: 24 mg/dL — ABNORMAL HIGH (ref 6–23)
CO2: 24 mEq/L (ref 19–32)
Calcium: 9.5 mg/dL (ref 8.4–10.5)
Chloride: 104 mEq/L (ref 96–112)
Creatinine, Ser: 0.9 mg/dL (ref 0.4–1.5)
GFR: 85.32 mL/min (ref >60.00)
Glucose, Bld: 105 mg/dL — ABNORMAL HIGH (ref 70–99)
Potassium: 4.2 mEq/L (ref 3.5–5.1)
Sodium: 137 mEq/L (ref 135–145)
Total Bilirubin: 0.8 mg/dL (ref 0.3–1.2)
Total Protein: 7.1 g/dL (ref 6.0–8.3)

## 2011-11-07 LAB — CBC WITH DIFFERENTIAL/PLATELET
Basophils Absolute: 0 10*3/uL (ref 0.0–0.1)
Basophils Relative: 0.7 % (ref 0.0–3.0)
Eosinophils Absolute: 0.1 10*3/uL (ref 0.0–0.7)
Eosinophils Relative: 2.3 % (ref 0.0–5.0)
HCT: 43.9 % (ref 39.0–52.0)
Hemoglobin: 14.7 g/dL (ref 13.0–17.0)
Lymphocytes Relative: 23.9 % (ref 12.0–46.0)
Lymphs Abs: 1.3 10*3/uL (ref 0.7–4.0)
MCHC: 33.5 g/dL (ref 30.0–36.0)
MCV: 94.6 fl (ref 78.0–100.0)
Monocytes Absolute: 0.6 10*3/uL (ref 0.1–1.0)
Monocytes Relative: 11.4 % (ref 3.0–12.0)
Neutro Abs: 3.4 10*3/uL (ref 1.4–7.7)
Neutrophils Relative %: 61.7 % (ref 43.0–77.0)
Platelets: 185 10*3/uL (ref 150.0–400.0)
RBC: 4.64 Mil/uL (ref 4.22–5.81)
RDW: 14.2 % (ref 11.5–14.6)
WBC: 5.5 10*3/uL (ref 4.5–10.5)

## 2011-11-07 LAB — HEPATIC FUNCTION PANEL
ALT: 201 U/L — ABNORMAL HIGH (ref 0–53)
AST: 140 U/L — ABNORMAL HIGH (ref 0–37)
Albumin: 4.3 g/dL (ref 3.5–5.2)
Alkaline Phosphatase: 51 U/L (ref 39–117)
Bilirubin, Direct: 0.2 mg/dL (ref 0.0–0.3)
Total Bilirubin: 0.8 mg/dL (ref 0.3–1.2)
Total Protein: 7.1 g/dL (ref 6.0–8.3)

## 2011-11-07 LAB — LIPID PANEL
Cholesterol: 161 mg/dL (ref 0–200)
HDL: 41 mg/dL (ref >39.00)
LDL Cholesterol: 107 mg/dL — ABNORMAL HIGH (ref 0–99)
Total CHOL/HDL Ratio: 4
Triglycerides: 64 mg/dL (ref 0.0–149.0)
VLDL: 12.8 mg/dL (ref 0.0–40.0)

## 2011-11-07 NOTE — Progress Notes (Signed)
Subjective:    Patient ID: Timothy Dickson, male    DOB: 1946-05-03, 66 y.o.   MRN: [id number]  HPI In February he was on a yoga retreat in [place]. Came back and he had diarrhea for several days. In the last several weeks he has had a respiratory infection which lingered. He has lost weight :  Wt Readings from Last 3 Encounters:  11/07/11 206 lb (93.441 kg)  03/18/11 209 lb (94.802 kg)  12/29/10 208 lb (94.348 kg)   He is followed by Dr. Wendelyn Breslow and had a positive fibrosure study but then he had liver biopsy that came back w/o fibrosis.   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 Osteoarthritis, knee   . Diverticulosis of colon   . Pneumonia   . History of substance abuse   . Family history of renal cell carcinoma   . Hyperglycemia   . History of BPH   . 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 Cardiac dysrhythmia, unspecified    Past Surgical History  Procedure Date  . Arthroscopy, left knee,   . Left shoulder repair for bone spur   . History of liver biopsy   . Transurethral needle ablation procedure   . Tuna procedure   . History of nephrectomy   . Arthroscopy right knee     may '11 (Dr Charlann Boxer)   Family History  Problem Relation Age of Onset  . Coronary artery disease Mother   . Cancer Brother     Prostate cancer  . Alcohol abuse Other     Strong family Hx  . Diabetes Sister   . Cancer Sister     breast cancer, lung cancer, esophageal   History   Social History  . Marital Status: Married    Spouse Name: N/A    Number of Children: N/A  . Years of Education: N/A   Occupational History  . Not on file.   Social History Main Topics  . Smoking status: Former Games developer  . Smokeless tobacco: Never Used  . Alcohol Use: No  . Drug Use: No  . Sexually Active: Yes -- Male partner(s)   Other Topics Concern  . Not on file    Social History Narrative   [name] BA, [place] [employer], [place] [employer]. Married- '84. 2 step-daughters, 4 grandchildren. 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 Marriage in good health.        Review of Systems System review is negative for any constitutional, cardiac, pulmonary, GI or neuro symptoms or complaints other than as described in the HPI.     Objective:   Physical Exam Filed Vitals:   11/07/11 1043  BP: 118/84  Pulse: 66  Temp: 98.2 F (36.8 C)  Resp: 16   Gen'l- WNWD white man in no distress HEENT- C&S clear w/o icterus Neck - supply, w/o thyromegaly Nodes - negative cervical and supraclavicular Cor - RRR PUlm - normal respirations, lungs CTAP Abd - BS+, no HSM, no guarding or rebound  Lab Results  Component Value Date   WBC 7.7 03/17/2011   HGB 14.7 03/17/2011   HCT 43.8 03/17/2011   PLT 186.0 03/17/2011   GLUCOSE 129* 03/17/2011   CHOL 137 03/17/2011   TRIG 49.0 03/17/2011   HDL 37.20* 03/17/2011   LDLCALC 90 03/17/2011  ALT 101* 03/17/2011   AST 73* 03/17/2011   NA 135 03/17/2011   K 4.4 03/17/2011   CL 100 03/17/2011   CREATININE 1.1 03/17/2011   BUN 17 03/17/2011   CO2 26 03/17/2011   TSH 0.90 03/17/2011   PSA 0.70 03/17/2011   INR 1.0 07/31/2008         Assessment & Plan:  Weight loss - only down two lbs from May '12. Is current with colonoscopy, EGD, Abd U/S and CT abd all negative for any sign of malignancy or premalignancy. PSA Aug '12 - 0.9. He has had CXR - negative and CT chest in '11 negative. He is feeling well. Primary concern would be parasitic GI infection as cause of weight loss and persistent loose stools.  Plan - Stool for O&P, Giardia/cryptosporosis, WBCs and leukoferrin, CBCD for eosinophilia           Due to mildly elevated serum glucose and CBGs will check A1C to r/o metabolic cause of weight loss

## 2011-11-09 ENCOUNTER — Other Ambulatory Visit: Payer: Self-pay | Admitting: Internal Medicine

## 2011-11-09 ENCOUNTER — Other Ambulatory Visit: Payer: BC Managed Care – PPO

## 2011-11-09 DIAGNOSIS — R634 Abnormal weight loss: Secondary | ICD-10-CM

## 2011-11-10 ENCOUNTER — Encounter: Payer: Self-pay | Admitting: Internal Medicine

## 2011-11-10 LAB — HEMOGLOBIN A1C: Hgb A1c MFr Bld: 6.8 % — ABNORMAL HIGH (ref 4.6–6.5)

## 2011-11-10 LAB — GIARDIA/CRYPTOSPORIDIUM (EIA)
Cryptosporidium Screen (EIA): NEGATIVE
Giardia Screen (EIA): NEGATIVE

## 2011-11-10 LAB — FECAL LACTOFERRIN, QUANT: Lactoferrin: POSITIVE

## 2011-11-10 LAB — GIARDIA ANTIGEN: Giardia Screen (EIA): NEGATIVE

## 2011-11-11 LAB — OVA AND PARASITE SCREEN: OP: NONE SEEN

## 2012-03-07 ENCOUNTER — Other Ambulatory Visit: Payer: Self-pay | Admitting: Gastroenterology

## 2012-03-07 DIAGNOSIS — B182 Chronic viral hepatitis C: Secondary | ICD-10-CM

## 2012-03-21 ENCOUNTER — Ambulatory Visit
Admission: RE | Admit: 2012-03-21 | Discharge: 2012-03-21 | Disposition: A | Discharge status: Home / Self Care | Payer: BC Managed Care – PPO | Source: Ambulatory Visit | Attending: Gastroenterology | Admitting: Gastroenterology

## 2012-03-21 DIAGNOSIS — B182 Chronic viral hepatitis C: Secondary | ICD-10-CM

## 2012-04-30 ENCOUNTER — Telehealth: Payer: Self-pay | Admitting: Internal Medicine

## 2012-04-30 ENCOUNTER — Encounter: Payer: Self-pay | Admitting: Internal Medicine

## 2012-04-30 ENCOUNTER — Ambulatory Visit (INDEPENDENT_AMBULATORY_CARE_PROVIDER_SITE_OTHER): Payer: 59 | Admitting: Internal Medicine

## 2012-04-30 VITALS — BP 116/70 | HR 88 | Temp 98.3°F | Resp 16 | Wt 205.0 lb

## 2012-04-30 DIAGNOSIS — Z23 Encounter for immunization: Secondary | ICD-10-CM

## 2012-04-30 DIAGNOSIS — H539 Unspecified visual disturbance: Secondary | ICD-10-CM

## 2012-04-30 NOTE — Patient Instructions (Addendum)
Brief episode of dimming vision does NOT suggest angina or any cardiac issue. More concerned about vascular disease or any thrombotic event. It is very likely we will not determine a cause but out of an abundance of caution will check for carotid artery disease and will look at cardiac anatomy via 2 D echo cardiogram.  Continue to take aspirin daily.

## 2012-04-30 NOTE — Progress Notes (Signed)
Subjective:    Patient ID: Timothy Dickson, male    DOB: 1946-05-02, 66 y.o.   MRN: [id number]  HPI Reviewed correspondence from [facility] liver clinic. He is waiting for full approval of new interferon free treatments.  He presents for follow-up of BP. Two weeks ago sitting at his computer he had an episode of the room dimmed which lasted for several seconds. His pulse was 64. Did not feel syncopal. Checked the internet about syncope. He was able to ride his bike 45 minutes - last week and Sunday without symptoms or problems. He has also had minor excursions of his blood pressure to as high as 165/98.  Past Medical History  Diagnosis Date  . Osteoarthritis, knee   . Diverticulosis of colon   . Pneumonia   . History of substance abuse   . Family history of renal cell carcinoma   . Hyperglycemia   . History of BPH   . 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 Cardiac dysrhythmia, unspecified    Past Surgical History  Procedure Date  . Arthroscopy, left knee,   . Left shoulder repair for bone spur   . History of liver biopsy   . Transurethral needle ablation procedure   . Tuna procedure   . History of nephrectomy   . Arthroscopy right knee     may '11 (Dr Charlann Boxer)   Family History  Problem Relation Age of Onset  . Coronary artery disease Mother   . Cancer Brother     Prostate cancer  . Alcohol abuse Other     Strong family Hx  . Diabetes Sister   . Cancer Sister     breast cancer, lung cancer, esophageal   History   Social History  . Marital Status: Married    Spouse Name: N/A    Number of Children: N/A  . Years of Education: N/A   Occupational History  . Not on file.   Social History Main Topics  . Smoking status: Former Games developer  . Smokeless tobacco: Never Used  . Alcohol Use: No  . Drug Use: No  . Sexually Active: Yes -- Male partner(s)   Other Topics Concern  . Not on file   Social History Narrative   [name] BA, [place] [employer], [place] [employer].  Married- '84. 2 step-daughters, 4 grandchildren. 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 Marriage in good health.    Current Outpatient Prescriptions on File Prior to Visit  Medication Sig Dispense Refill  . aspirin EC 81 MG tablet Take 81 mg by mouth daily.        [name] KRILL OIL PO Take 1 tablet by mouth daily.        . Multiple Vitamin (MULTIVITAMIN) capsule Take 1 capsule by mouth daily.        [name] omeprazole (PRILOSEC) 20 MG capsule Take 20 mg by mouth daily.         Current Facility-Administered Medications on File Prior to Visit  Medication Dose Route Frequency Provider Last Rate Last Dose  . TDaP (BOOSTRIX) injection 0.5 mL  0.5 mL Intramuscular Once Jacques Navy, MD             Review of Systems System review is negative for any constitutional, cardiac, pulmonary, GI or neuro symptoms or complaints other than as described in the HPI.     Objective:   Physical Exam Filed Vitals:   04/30/12 1657  BP: 116/70  Pulse: 88  Temp: 98.3 F (36.8 C)  Resp: 16   Gen'l- WNWD white man Cor - 2+ radial pulse, RRR, no carotid bruit, erratic heart rate but not clearly IRIR/a. Fib. No murmur Pulm - normal respirations. Neuro - A&O x 3, speech clear, cognition normal. CN II-XII normal. MS normal, DTRs normal, cerebellar - normal gait.       Assessment & Plan:  Change in vision- Brief episode of dimming vision does NOT suggest angina or any cardiac issue. More concerned about vascular disease or any thrombotic event. It is very likely we will not determine a cause but out of an abundance of caution will check for carotid artery disease and will look at cardiac anatomy via 2 D echo cardiogram.  Continue to take aspirin daily.

## 2012-04-30 NOTE — Telephone Encounter (Signed)
Caller: [name]/Patient; Patient Name: Timothy Dickson; PCP: Illene Regulus (Adults only); Best Callback Phone Number: [phone]; Call regarding Dizziness, onset 2 weeks.  Patient has had dizzy spells for the last 2 weeks on 9-25 dizziness worsen with the room appearing to go dark per Patient.  New onset of elevated BP 165/88 and BP140/96 on 9-30.   All emergent symptoms ruled out per Hyptertension protocol, see in 72 hour due to multiple elevated BP reading and no previous owrk-up.  Appointment scheduled on 9-30 at 1615 with Dr Debby Bud. Patient verbalized understanding.

## 2012-05-02 ENCOUNTER — Other Ambulatory Visit: Payer: Self-pay | Admitting: Cardiology

## 2012-05-02 DIAGNOSIS — H539 Unspecified visual disturbance: Secondary | ICD-10-CM

## 2012-05-07 ENCOUNTER — Encounter (INDEPENDENT_AMBULATORY_CARE_PROVIDER_SITE_OTHER): Payer: 59

## 2012-05-07 ENCOUNTER — Ambulatory Visit (HOSPITAL_COMMUNITY): Payer: 59 | Attending: Cardiology | Admitting: Radiology

## 2012-05-07 DIAGNOSIS — H53129 Transient visual loss, unspecified eye: Secondary | ICD-10-CM

## 2012-05-07 DIAGNOSIS — I6529 Occlusion and stenosis of unspecified carotid artery: Secondary | ICD-10-CM

## 2012-05-07 DIAGNOSIS — I10 Essential (primary) hypertension: Secondary | ICD-10-CM | POA: Insufficient documentation

## 2012-05-07 DIAGNOSIS — I369 Nonrheumatic tricuspid valve disorder, unspecified: Secondary | ICD-10-CM | POA: Insufficient documentation

## 2012-05-07 DIAGNOSIS — H539 Unspecified visual disturbance: Secondary | ICD-10-CM

## 2012-05-07 DIAGNOSIS — R42 Dizziness and giddiness: Secondary | ICD-10-CM

## 2012-05-07 DIAGNOSIS — R002 Palpitations: Secondary | ICD-10-CM | POA: Insufficient documentation

## 2012-05-07 DIAGNOSIS — Z8249 Family history of ischemic heart disease and other diseases of the circulatory system: Secondary | ICD-10-CM | POA: Insufficient documentation

## 2012-05-07 DIAGNOSIS — I059 Rheumatic mitral valve disease, unspecified: Secondary | ICD-10-CM | POA: Insufficient documentation

## 2012-05-07 NOTE — Progress Notes (Signed)
Echocardiogram performed.  

## 2012-05-14 ENCOUNTER — Encounter: Payer: Self-pay | Admitting: Internal Medicine

## 2012-05-14 ENCOUNTER — Telehealth: Payer: Self-pay | Admitting: *Deleted

## 2012-05-14 NOTE — Telephone Encounter (Signed)
Message copied by Elnora Morrison on Mon May 14, 2012  2:00 PM ------      Message from: Jacques Navy      Created: Mon May 14, 2012  6:06 AM       Call pateint - carotid doppler without blockage. Repeat study in 1 year recommended by reading cardiologist.

## 2012-05-14 NOTE — Telephone Encounter (Signed)
Left message on home/cell# and work # of carotid doppler results.

## 2012-05-19 ENCOUNTER — Ambulatory Visit: Payer: 59

## 2012-05-19 ENCOUNTER — Ambulatory Visit (INDEPENDENT_AMBULATORY_CARE_PROVIDER_SITE_OTHER): Payer: 59 | Admitting: Family Medicine

## 2012-05-19 VITALS — BP 142/86 | HR 70 | Temp 98.0°F | Resp 17 | Ht 71.5 in | Wt 207.0 lb

## 2012-05-19 DIAGNOSIS — M51379 Other intervertebral disc degeneration, lumbosacral region without mention of lumbar back pain or lower extremity pain: Secondary | ICD-10-CM

## 2012-05-19 DIAGNOSIS — M549 Dorsalgia, unspecified: Secondary | ICD-10-CM

## 2012-05-19 DIAGNOSIS — S335XXA Sprain of ligaments of lumbar spine, initial encounter: Secondary | ICD-10-CM

## 2012-05-19 DIAGNOSIS — M51369 Other intervertebral disc degeneration, lumbar region without mention of lumbar back pain or lower extremity pain: Secondary | ICD-10-CM

## 2012-05-19 DIAGNOSIS — M5136 Other intervertebral disc degeneration, lumbar region: Secondary | ICD-10-CM

## 2012-05-19 DIAGNOSIS — M5137 Other intervertebral disc degeneration, lumbosacral region: Secondary | ICD-10-CM

## 2012-05-19 DIAGNOSIS — S39012A Strain of muscle, fascia and tendon of lower back, initial encounter: Secondary | ICD-10-CM

## 2012-05-19 MED ORDER — IBUPROFEN 800 MG PO TABS: 800.0000 mg | ORAL_TABLET | Freq: Three times a day (TID) | ORAL | Status: DC | PRN

## 2012-05-19 MED ORDER — METAXALONE 800 MG PO TABS: 800.0000 mg | ORAL_TABLET | Freq: Three times a day (TID) | ORAL | Status: DC

## 2012-05-19 NOTE — Patient Instructions (Signed)
1. Back pain  DG Lumbar Spine Complete  2. Lumbar strain    3. DDD (degenerative disc disease), lumbar     Low Back Sprain with Rehab  A sprain is an injury in which a ligament is torn. The ligaments of the lower back are vulnerable to sprains. However, they are strong and require great force to be injured. These ligaments are important for stabilizing the spinal column. Sprains are classified into three categories. Grade 1 sprains cause pain, but the tendon is not lengthened. Grade 2 sprains include a lengthened ligament, due to the ligament being stretched or partially ruptured. With grade 2 sprains there is still function, although the function may be decreased. Grade 3 sprains involve a complete tear of the tendon or muscle, and function is usually impaired. SYMPTOMS   Severe pain in the lower back.  Sometimes, a feeling of a "pop," "snap," or tear, at the time of injury.  Tenderness and sometimes swelling at the injury site.  Uncommonly, bruising (contusion) within 48 hours of injury.  Muscle spasms in the back. CAUSES  Low back sprains occur when a force is placed on the ligaments that is greater than they can handle. Common causes of injury include:  Performing a stressful act while off-balance.  Repetitive stressful activities that involve movement of the lower back.  Direct hit (trauma) to the lower back. RISK INCREASES WITH:  Contact sports (football, wrestling).  Collisions (major skiing accidents).  Sports that require throwing or lifting (baseball, weightlifting).  Sports involving twisting of the spine (gymnastics, diving, tennis, golf).  Poor strength and flexibility.  Inadequate protection.  Previous back injury or surgery (especially fusion). PREVENTION  Wear properly fitted and padded protective equipment.  Warm up and stretch properly before activity.  Allow for adequate recovery between workouts.  Maintain physical fitness:  Strength,  flexibility, and endurance.  Cardiovascular fitness.  Maintain a healthy body weight. PROGNOSIS  If treated properly, low back sprains usually heal with non-surgical treatment. The length of time for healing depends on the severity of the injury.  RELATED COMPLICATIONS   Recurring symptoms, resulting in a chronic problem.  Chronic inflammation and pain in the low back.  Delayed healing or resolution of symptoms, especially if activity is resumed too soon.  Prolonged impairment.  Unstable or arthritic joints of the low back. TREATMENT  Treatment first involves the use of ice and medicine, to reduce pain and inflammation. The use of strengthening and stretching exercises may help reduce pain with activity. These exercises may be performed at home or with a therapist. Severe injuries may require referral to a therapist for further evaluation and treatment, such as ultrasound. Your caregiver may advise that you wear a back brace or corset, to help reduce pain and discomfort. Often, prolonged bed rest results in greater harm then benefit. Corticosteroid injections may be recommended. However, these should be reserved for the most serious cases. It is important to avoid using your back when lifting objects. At night, sleep on your back on a firm mattress, with a pillow placed under your knees. If non-surgical treatment is unsuccessful, surgery may be needed.  MEDICATION   If pain medicine is needed, nonsteroidal anti-inflammatory medicines (aspirin and ibuprofen), or other minor pain relievers (acetaminophen), are often advised.  Do not take pain medicine for 7 days before surgery.  Prescription pain relievers may be given, if your caregiver thinks they are needed. Use only as directed and only as much as you need.  Ointments applied  to the skin may be helpful.  Corticosteroid injections may be given by your caregiver. These injections should be reserved for the most serious cases, because  they may only be given a certain number of times. HEAT AND COLD  Cold treatment (icing) should be applied for 10 to 15 minutes every 2 to 3 hours for inflammation and pain, and immediately after activity that aggravates your symptoms. Use ice packs or an ice massage.  Heat treatment may be used before performing stretching and strengthening activities prescribed by your caregiver, physical therapist, or athletic trainer. Use a heat pack or a warm water soak. SEEK MEDICAL CARE IF:   Symptoms get worse or do not improve in 2 to 4 weeks, despite treatment.  You develop numbness or weakness in either leg.  You lose bowel or bladder function.  Any of the following occur after surgery: fever, increased pain, swelling, redness, drainage of fluids, or bleeding in the affected area.  New, unexplained symptoms develop. (Drugs used in treatment may produce side effects.) EXERCISES  RANGE OF MOTION (ROM) AND STRETCHING EXERCISES - Low Back Sprain Most people with lower back pain will find that their symptoms get worse with excessive bending forward (flexion) or arching at the lower back (extension). The exercises that will help resolve your symptoms will focus on the opposite motion.  Your physician, physical therapist or athletic trainer will help you determine which exercises will be most helpful to resolve your lower back pain. Do not complete any exercises without first consulting with your caregiver. Discontinue any exercises which make your symptoms worse, until you speak to your caregiver. If you have pain, numbness or tingling which travels down into your buttocks, leg or foot, the goal of the therapy is for these symptoms to move closer to your back and eventually resolve. Sometimes, these leg symptoms will get better, but your lower back pain may worsen. This is often an indication of progress in your rehabilitation. Be very alert to any changes in your symptoms and the activities in which you  participated in the 24 hours prior to the change. Sharing this information with your caregiver will allow him or her to most efficiently treat your condition. These exercises may help you when beginning to rehabilitate your injury. Your symptoms may resolve with or without further involvement from your physician, physical therapist or athletic trainer. While completing these exercises, remember:   Restoring tissue flexibility helps normal motion to return to the joints. This allows healthier, less painful movement and activity.  An effective stretch should be held for at least 30 seconds.  A stretch should never be painful. You should only feel a gentle lengthening or release in the stretched tissue. FLEXION RANGE OF MOTION AND STRETCHING EXERCISES: STRETCH  Flexion, Single Knee to Chest   Lie on a firm bed or floor with both legs extended in front of you.  Keeping one leg in contact with the floor, bring your opposite knee to your chest. Hold your leg in place by either grabbing behind your thigh or at your knee.  Pull until you feel a gentle stretch in your low back. Hold __________ seconds.  Slowly release your grasp and repeat the exercise with the opposite side. Repeat __________ times. Complete this exercise __________ times per day.  STRETCH  Flexion, Double Knee to Chest  Lie on a firm bed or floor with both legs extended in front of you.  Keeping one leg in contact with the floor, bring your opposite  knee to your chest.  Tense your stomach muscles to support your back and then lift your other knee to your chest. Hold your legs in place by either grabbing behind your thighs or at your knees.  Pull both knees toward your chest until you feel a gentle stretch in your low back. Hold __________ seconds.  Tense your stomach muscles and slowly return one leg at a time to the floor. Repeat __________ times. Complete this exercise __________ times per day.  STRETCH  Low Trunk  Rotation  Lie on a firm bed or floor. Keeping your legs in front of you, bend your knees so they are both pointed toward the ceiling and your feet are flat on the floor.  Extend your arms out to the side. This will stabilize your upper body by keeping your shoulders in contact with the floor.  Gently and slowly drop both knees together to one side until you feel a gentle stretch in your low back. Hold for __________ seconds.  Tense your stomach muscles to support your lower back as you bring your knees back to the starting position. Repeat the exercise to the other side. Repeat __________ times. Complete this exercise __________ times per day  EXTENSION RANGE OF MOTION AND FLEXIBILITY EXERCISES: STRETCH  Extension, Prone on Elbows   Lie on your stomach on the floor, a bed will be too soft. Place your palms about shoulder width apart and at the height of your head.  Place your elbows under your shoulders. If this is too painful, stack pillows under your chest.  Allow your body to relax so that your hips drop lower and make contact more completely with the floor.  Hold this position for __________ seconds.  Slowly return to lying flat on the floor. Repeat __________ times. Complete this exercise __________ times per day.  RANGE OF MOTION  Extension, Prone Press Ups  Lie on your stomach on the floor, a bed will be too soft. Place your palms about shoulder width apart and at the height of your head.  Keeping your back as relaxed as possible, slowly straighten your elbows while keeping your hips on the floor. You may adjust the placement of your hands to maximize your comfort. As you gain motion, your hands will come more underneath your shoulders.  Hold this position __________ seconds.  Slowly return to lying flat on the floor. Repeat __________ times. Complete this exercise __________ times per day.  RANGE OF MOTION- Quadruped, Neutral Spine   Assume a hands and knees position on a  firm surface. Keep your hands under your shoulders and your knees under your hips. You may place padding under your knees for comfort.  Drop your head and point your tailbone toward the ground below you. This will round out your lower back like an angry cat. Hold this position for __________ seconds.  Slowly lift your head and release your tail bone so that your back sags into a large arch, like an old horse.  Hold this position for __________ seconds.  Repeat this until you feel limber in your low back.  Now, find your "sweet spot." This will be the most comfortable position somewhere between the two previous positions. This is your neutral spine. Once you have found this position, tense your stomach muscles to support your low back.  Hold this position for __________ seconds. Repeat __________ times. Complete this exercise __________ times per day.  STRENGTHENING EXERCISES - Low Back Sprain These exercises may help you  when beginning to rehabilitate your injury. These exercises should be done near your "sweet spot." This is the neutral, low-back arch, somewhere between fully rounded and fully arched, that is your least painful position. When performed in this safe range of motion, these exercises can be used for people who have either a flexion or extension based injury. These exercises may resolve your symptoms with or without further involvement from your physician, physical therapist or athletic trainer. While completing these exercises, remember:   Muscles can gain both the endurance and the strength needed for everyday activities through controlled exercises.  Complete these exercises as instructed by your physician, physical therapist or athletic trainer. Increase the resistance and repetitions only as guided.  You may experience muscle soreness or fatigue, but the pain or discomfort you are trying to eliminate should never worsen during these exercises. If this pain does worsen, stop  and make certain you are following the directions exactly. If the pain is still present after adjustments, discontinue the exercise until you can discuss the trouble with your caregiver. STRENGTHENING Deep Abdominals, Pelvic Tilt   Lie on a firm bed or floor. Keeping your legs in front of you, bend your knees so they are both pointed toward the ceiling and your feet are flat on the floor.  Tense your lower abdominal muscles to press your low back into the floor. This motion will rotate your pelvis so that your tail bone is scooping upwards rather than pointing at your feet or into the floor. With a gentle tension and even breathing, hold this position for __________ seconds. Repeat __________ times. Complete this exercise __________ times per day.  STRENGTHENING  Abdominals, Crunches   Lie on a firm bed or floor. Keeping your legs in front of you, bend your knees so they are both pointed toward the ceiling and your feet are flat on the floor. Cross your arms over your chest.  Slightly tip your chin down without bending your neck.  Tense your abdominals and slowly lift your trunk high enough to just clear your shoulder blades. Lifting higher can put excessive stress on the lower back and does not further strengthen your abdominal muscles.  Control your return to the starting position. Repeat __________ times. Complete this exercise __________ times per day.  STRENGTHENING  Quadruped, Opposite UE/LE Lift   Assume a hands and knees position on a firm surface. Keep your hands under your shoulders and your knees under your hips. You may place padding under your knees for comfort.  Find your neutral spine and gently tense your abdominal muscles so that you can maintain this position. Your shoulders and hips should form a rectangle that is parallel with the floor and is not twisted.  Keeping your trunk steady, lift your right hand no higher than your shoulder and then your left leg no higher than  your hip. Make sure you are not holding your breath. Hold this position for __________ seconds.  Continuing to keep your abdominal muscles tense and your back steady, slowly return to your starting position. Repeat with the opposite arm and leg. Repeat __________ times. Complete this exercise __________ times per day.  STRENGTHENING  Abdominals and Quadriceps, Straight Leg Raise   Lie on a firm bed or floor with both legs extended in front of you.  Keeping one leg in contact with the floor, bend the other knee so that your foot can rest flat on the floor.  Find your neutral spine, and tense your abdominal  muscles to maintain your spinal position throughout the exercise.  Slowly lift your straight leg off the floor about 6 inches for a count of 15, making sure to not hold your breath.  Still keeping your neutral spine, slowly lower your leg all the way to the floor. Repeat this exercise with each leg __________ times. Complete this exercise __________ times per day. POSTURE AND BODY MECHANICS CONSIDERATIONS - Low Back Sprain Keeping correct posture when sitting, standing or completing your activities will reduce the stress put on different body tissues, allowing injured tissues a chance to heal and limiting painful experiences. The following are general guidelines for improved posture. Your physician or physical therapist will provide you with any instructions specific to your needs. While reading these guidelines, remember:  The exercises prescribed by your provider will help you have the flexibility and strength to maintain correct postures.  The correct posture provides the best environment for your joints to work. All of your joints have less wear and tear when properly supported by a spine with good posture. This means you will experience a healthier, less painful body.  Correct posture must be practiced with all of your activities, especially prolonged sitting and standing. Correct  posture is as important when doing repetitive low-stress activities (typing) as it is when doing a single heavy-load activity (lifting). RESTING POSITIONS Consider which positions are most painful for you when choosing a resting position. If you have pain with flexion-based activities (sitting, bending, stooping, squatting), choose a position that allows you to rest in a less flexed posture. You would want to avoid curling into a fetal position on your side. If your pain worsens with extension-based activities (prolonged standing, working overhead), avoid resting in an extended position such as sleeping on your stomach. Most people will find more comfort when they rest with their spine in a more neutral position, neither too rounded nor too arched. Lying on a non-sagging bed on your side with a pillow between your knees, or on your back with a pillow under your knees will often provide some relief. Keep in mind, being in any one position for a prolonged period of time, no matter how correct your posture, can still lead to stiffness. PROPER SITTING POSTURE In order to minimize stress and discomfort on your spine, you must sit with correct posture. Sitting with good posture should be effortless for a healthy body. Returning to good posture is a gradual process. Many people can work toward this most comfortably by using various supports until they have the flexibility and strength to maintain this posture on their own. When sitting with proper posture, your ears will fall over your shoulders and your shoulders will fall over your hips. You should use the back of the chair to support your upper back. Your lower back will be in a neutral position, just slightly arched. You may place a small pillow or folded towel at the base of your lower back for  support.  When working at a desk, create an environment that supports good, upright posture. Without extra support, muscles tire, which leads to excessive strain on  joints and other tissues. Keep these recommendations in mind: CHAIR:  A chair should be able to slide under your desk when your back makes contact with the back of the chair. This allows you to work closely.  The chair's height should allow your eyes to be level with the upper part of your monitor and your hands to be slightly lower than your  elbows. BODY POSITION  Your feet should make contact with the floor. If this is not possible, use a foot rest.  Keep your ears over your shoulders. This will reduce stress on your neck and low back. INCORRECT SITTING POSTURES  If you are feeling tired and unable to assume a healthy sitting posture, do not slouch or slump. This puts excessive strain on your back tissues, causing more damage and pain. Healthier options include:  Using more support, like a lumbar pillow.  Switching tasks to something that requires you to be upright or walking.  Talking a brief walk.  Lying down to rest in a neutral-spine position. PROLONGED STANDING WHILE SLIGHTLY LEANING FORWARD  When completing a task that requires you to lean forward while standing in one place for a long time, place either foot up on a stationary 2-4 inch high object to help maintain the best posture. When both feet are on the ground, the lower back tends to lose its slight inward curve. If this curve flattens (or becomes too large), then the back and your other joints will experience too much stress, tire more quickly, and can cause pain. CORRECT STANDING POSTURES Proper standing posture should be assumed with all daily activities, even if they only take a few moments, like when brushing your teeth. As in sitting, your ears should fall over your shoulders and your shoulders should fall over your hips. You should keep a slight tension in your abdominal muscles to brace your spine. Your tailbone should point down to the ground, not behind your body, resulting in an over-extended swayback posture.   INCORRECT STANDING POSTURES  Common incorrect standing postures include a forward head, locked knees and/or an excessive swayback. WALKING Walk with an upright posture. Your ears, shoulders and hips should all line-up. PROLONGED ACTIVITY IN A FLEXED POSITION When completing a task that requires you to bend forward at your waist or lean over a low surface, try to find a way to stabilize 3 out of 4 of your limbs. You can place a hand or elbow on your thigh or rest a knee on the surface you are reaching across. This will provide you more stability, so that your muscles do not tire as quickly. By keeping your knees relaxed, or slightly bent, you will also reduce stress across your lower back. CORRECT LIFTING TECHNIQUES DO :  Assume a wide stance. This will provide you more stability and the opportunity to get as close as possible to the object which you are lifting.  Tense your abdominals to brace your spine. Bend at the knees and hips. Keeping your back locked in a neutral-spine position, lift using your leg muscles. Lift with your legs, keeping your back straight.  Test the weight of unknown objects before attempting to lift them.  Try to keep your elbows locked down at your sides in order get the best strength from your shoulders when carrying an object.  Always ask for help when lifting heavy or awkward objects. INCORRECT LIFTING TECHNIQUES DO NOT:   Lock your knees when lifting, even if it is a small object.  Bend and twist. Pivot at your feet or move your feet when needing to change directions.  Assume that you can safely pick up even a paperclip without proper posture. Document Released: 07/18/2005 Document Revised: 10/10/2011 Document Reviewed: 10/30/2008 [facility] Patient Information 2013 [place], [place].

## 2012-05-19 NOTE — Progress Notes (Signed)
[address]   [place], [place]  [address]   [phone]  Subjective:    Patient ID: Timothy Dickson, male    DOB: 10-Apr-1946, 66 y.o.   MRN: [id number]  HPIThis 66 y.o. male presents for evaluation of low back pain.  At work, loading a box and tilted forward a little with acute onset of low back pain.  Stabbing pain.  Similar symptoms 5-6 years ago; prescribed muscle relaxers and motrin with relief.  Onset two days ago.  No improvement.  Taking Aleve two last night; slept well; did need to change positions a lot but comfortable.  Unable to bend forward very far.  Squatting causes stabbing pain.  Lower back B; No radiation; no n/t/w.  Normal b/b function.  No saddle paresthesias.  Severity 7/10 with certain movements.  Certain movements trigger severe pain.  Walking is minimal pain.  No pain at rest.  Wearing back brace with some relief.  Heating pad without much improvement.      Review of Systems  Constitutional: Negative for fever, chills, diaphoresis and fatigue.  Musculoskeletal: Positive for myalgias, back pain and gait problem. Negative for joint swelling and arthralgias.  Neurological: Negative for weakness and numbness.     Past Medical History  Diagnosis Date  . Osteoarthritis, knee   . Diverticulosis of colon   . Pneumonia   . History of substance abuse   . Family history of renal cell carcinoma   . Hyperglycemia   . History of BPH   . 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 Cardiac dysrhythmia, unspecified     Past Surgical History  Procedure Date  . Arthroscopy, left knee,   . Left shoulder repair for bone spur   . History of liver biopsy   . Transurethral needle ablation procedure   . Tuna procedure   . History of nephrectomy   . Arthroscopy right knee     may '11 (Dr Charlann Boxer)    Prior to Admission medications   Medication Sig Start Date End Date Taking? Authorizing Provider  aspirin EC 81 MG tablet Take 81 mg by mouth daily.     Yes Historical  Provider, MD  KRILL OIL PO Take 1 tablet by mouth daily.     Yes Historical Provider, MD  Multiple Vitamin (MULTIVITAMIN) capsule Take 1 capsule by mouth daily.     Yes Historical Provider, MD  omeprazole (PRILOSEC) 20 MG capsule Take 20 mg by mouth daily.     Yes Historical Provider, MD    No Known Allergies  History   Social History  . Marital Status: Married    Spouse Name: N/A    Number of Children: N/A  . Years of Education: N/A   Occupational History  . Not on file.   Social History Main Topics  . Smoking status: Former Games developer  . Smokeless tobacco: Never Used  . Alcohol Use: No  . Drug Use: No  . Sexually Active: Yes -- Male partner(s)   Other Topics Concern  . Not on file   Social History Narrative   [name] BA, [place] [employer] [place], [place] psych. Married- '84. 2 step-daughters, 4 grandchildren. 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 Marriage in good health.    Family History  Problem Relation Age of Onset  . Coronary artery disease Mother   . Cancer Brother     Prostate cancer  . Alcohol abuse Other  Strong family Hx  . Diabetes Sister   . Cancer Sister     breast cancer, lung cancer, esophageal     Objective:   Physical Exam  Constitutional: He is oriented to person, place, and time. He appears well-developed and well-nourished. No distress.  Musculoskeletal:       Lumbar back: He exhibits decreased range of motion and pain. He exhibits no tenderness, no bony tenderness, no swelling, no edema, no deformity, no laceration, no spasm and normal pulse.       LUMBAR SPINE: PAIN WITH FLEXION, EXTENSION; NO PAIN WITH ROTATION SIDE TO SIDE OR LATERAL BENDING; STRAIGHT LEG RAISES POSITIVE; CONTRALATERAL STRAIGHT LEG RAISE NEGATIVE; TOE AND HEEL WALKING INTACT; MARCHING IN PLACE INTACT.  MOTOR 5/5 BLE.    Neurological: He is alert and oriented to person, place, and time. He has normal reflexes. No cranial nerve deficit. He  exhibits normal muscle tone. Coordination normal.  Skin: Skin is warm and dry. No rash noted. He is not diaphoretic.  Psychiatric: He has a normal mood and affect. His behavior is normal.   [facility] reading (PRIMARY) by  Dr. Katrinka Blazing.  LS SPINE:  DDD; +spurring at multiple levels.      Assessment & Plan:   1. Back pain  DG Lumbar Spine Complete  2. Lumbar strain  ibuprofen (ADVIL,MOTRIN) 800 MG tablet, metaxalone (SKELAXIN) 800 MG tablet  3. DDD (degenerative disc disease), lumbar       1.  Lumbar Pain/strain: New.  Rx for Ibuprofen 800mg  tid, Skelaxin 800mg  tid scheduled for next 5-10 days then PRN.  Home exercises provided to perform daily.  Avoid repetitive lifting,twisting,rotating for next two weeks.  Can resume yoga in two weeks.  To call if no improvement in two weeks for ortho referral.   2. DDD Lumbar:  New to this provider.  Pt advised of xray findings.  Important to continue yoga, exercise, strengthening of core.

## 2012-05-22 ENCOUNTER — Ambulatory Visit (INDEPENDENT_AMBULATORY_CARE_PROVIDER_SITE_OTHER): Payer: 59 | Admitting: Internal Medicine

## 2012-05-22 ENCOUNTER — Other Ambulatory Visit (INDEPENDENT_AMBULATORY_CARE_PROVIDER_SITE_OTHER): Payer: 59

## 2012-05-22 ENCOUNTER — Encounter: Payer: Self-pay | Admitting: Internal Medicine

## 2012-05-22 ENCOUNTER — Ambulatory Visit (INDEPENDENT_AMBULATORY_CARE_PROVIDER_SITE_OTHER)
Admission: RE | Admit: 2012-05-22 | Discharge: 2012-05-22 | Disposition: A | Discharge status: Home / Self Care | Payer: 59 | Source: Ambulatory Visit | Attending: Internal Medicine | Admitting: Internal Medicine

## 2012-05-22 VITALS — BP 132/80 | HR 74 | Temp 97.7°F | Resp 16 | Wt 207.0 lb

## 2012-05-22 DIAGNOSIS — Z87898 Personal history of other specified conditions: Secondary | ICD-10-CM

## 2012-05-22 DIAGNOSIS — C649 Malignant neoplasm of unspecified kidney, except renal pelvis: Secondary | ICD-10-CM

## 2012-05-22 DIAGNOSIS — M545 Low back pain, unspecified: Secondary | ICD-10-CM

## 2012-05-22 DIAGNOSIS — Z0000 Encounter for general adult medical examination without abnormal findings: Secondary | ICD-10-CM

## 2012-05-22 DIAGNOSIS — B1710 Acute hepatitis C without hepatic coma: Secondary | ICD-10-CM

## 2012-05-22 DIAGNOSIS — R7309 Other abnormal glucose: Secondary | ICD-10-CM

## 2012-05-22 DIAGNOSIS — G8929 Other chronic pain: Secondary | ICD-10-CM | POA: Insufficient documentation

## 2012-05-22 DIAGNOSIS — I499 Cardiac arrhythmia, unspecified: Secondary | ICD-10-CM

## 2012-05-22 LAB — HEMOGLOBIN A1C: Hgb A1c MFr Bld: 6.7 % — ABNORMAL HIGH (ref 4.6–6.5)

## 2012-05-22 MED ORDER — METHOCARBAMOL 500 MG PO TABS: 500.0000 mg | ORAL_TABLET | Freq: Four times a day (QID) | ORAL | Status: DC

## 2012-05-22 NOTE — Assessment & Plan Note (Signed)
INterval history - has had a transient elevation of Blood pressure; he has had [facility] consult in liver clinic. He has not had any major medical illness, injury or surgery. Physical exam is normal. Reviewed labs from April as well as labs done at [facility] - stable with liver functions back to baseline. He is current with colorectal cancer screening. Discussed pros and cons of prostate cancer screening (USPHCTF recommendations reviewed and ACU April '13 recommendations) and he defers evaluation at this time due to PSA in the last 24 months.  In summary - a very nice man who is medically stable and doing well. He is working on good back health and continuing to do stretch/flex exercise.He will return as needed or in 1 year.

## 2012-05-22 NOTE — Assessment & Plan Note (Signed)
No significant symptoms of prostatism.

## 2012-05-22 NOTE — Progress Notes (Signed)
Subjective:    Patient ID: Timothy Dickson, male    DOB: 07/23/1946, 66 y.o.   MRN: [id number]  HPI Mr. Timothy Dickson presents for routine general medical exam. Interval history - was seen at [facility] Liver clinic - note reviewed. His liver functions were stable, improved from April '13. He had his genotype rechecked. He is now on a list for new trials and is also on a list for non-interferon treatment when available.   He was recently worked up for transient high BP see recent notes: He had 2D echo that was normal and carotid dopplers that were normal.  On the 19th October he was seen for acute back pain - diagnosed with low back strain. He was prescribed Aleve and skelaxin. He is doing a little better but still with back pain and spasm.  He continues to have knee pain but Dr. Lequita Halt does not feel he needs surgery. He has been seen at [facility] PT Karin Golden)  and had "needling" which gave him relief from muscle spasm of the quads. He has also been doing on-going stretching exercises.  He has continued to do Yoga.   Past Medical History  Diagnosis Date  . Osteoarthritis, knee   . Diverticulosis of colon   . Pneumonia   . History of substance abuse   . Family history of renal cell carcinoma   . Hyperglycemia   . History of BPH   . 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 Cardiac dysrhythmia, unspecified    Past Surgical History  Procedure Date  . Arthroscopy, left knee,   . Left shoulder repair for bone spur   . History of liver biopsy   . Transurethral needle ablation procedure   . Tuna procedure   . History of nephrectomy   . Arthroscopy right knee     may '11 (Dr Charlann Boxer)   Family History  Problem Relation Age of Onset  . Coronary artery disease Mother   . Cancer Brother     Prostate cancer  . Alcohol abuse Other     Strong family Hx  . Diabetes Sister   . Cancer Sister     breast cancer, lung cancer, esophageal   History   Social History  . Marital Status: Married   Spouse Name: N/A    Number of Children: N/A  . Years of Education: N/A   Occupational History  . Not on file.   Social History Main Topics  . Smoking status: Former Games developer  . Smokeless tobacco: Never Used  . Alcohol Use: No  . Drug Use: No  . Sexually Active: Yes -- Male partner(s)   Other Topics Concern  . Not on file   Social History Narrative   [name] BA, [place], [place] psych. Married- '84. 2 step-daughters, 4 grandchildren. 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 Marriage in good health.    Current Outpatient Prescriptions on File Prior to Visit  Medication Sig Dispense Refill  . aspirin EC 81 MG tablet Take 81 mg by mouth daily.        [name] ibuprofen (ADVIL,MOTRIN) 800 MG tablet Take 1 tablet (800 mg total) by mouth every 8 (eight) hours as needed for pain.  30 tablet  0  . KRILL OIL PO Take 1 tablet by mouth daily.        . metaxalone (SKELAXIN) 800 MG tablet Take 1 tablet (800 mg total) by mouth 3 (three) times  daily.  60 tablet  0  . Multiple Vitamin (MULTIVITAMIN) capsule Take 1 capsule by mouth daily.        [name] omeprazole (PRILOSEC) 20 MG capsule Take 20 mg by mouth daily.         Current Facility-Administered Medications on File Prior to Visit  Medication Dose Route Frequency Provider Last Rate Last Dose  . TDaP (BOOSTRIX) injection 0.5 mL  0.5 mL Intramuscular Once Jacques Navy, MD          Review of Systems Constitutional:  Negative for fever, chills, activity change and unexpected weight change.  HEENT:  Negative for hearing loss, ear pain, congestion, neck stiffness and postnasal drip. Negative for sore throat or swallowing problems. Negative for dental complaints.   Eyes: Negative for vision loss or change in visual acuity.  Respiratory: Negative for chest tightness and wheezing. Negative for DOE.   Cardiovascular: Negative for chest pain or palpitations. No decreased exercise tolerance Gastrointestinal:  No change in bowel habit. No bloating or gas. No reflux or indigestion Genitourinary: Negative for urgency, frequency, flank pain and difficulty urinating.  Musculoskeletal: Negative for myalgias, arthralgias and gait problem. Back pain due to DDD,DJD - had recent x-rays. Neurological: Negative for dizziness, tremors, weakness and headaches.  Hematological: Negative for adenopathy.  Psychiatric/Behavioral: Negative for behavioral problems and dysphoric mood.       Objective:   Physical Exam Filed Vitals:   05/22/12 0912  BP: 132/80  Pulse: 74  Temp: 97.7 F (36.5 C)  Resp: 16   Wt Readings from Last 3 Encounters:  05/22/12 207 lb (93.895 kg)  05/19/12 207 lb (93.895 kg)  04/30/12 205 lb (92.987 kg)   Gen'l: Well nourished well developed white male in no acute distress  HEENT: Head: Normocephalic and atraumatic. Right Ear: External ear normal. EAC/TM nl. Left Ear: External ear normal.  EAC/TM nl. Nose: Nose normal. Mouth/Throat: Oropharynx is clear and moist. Dentition - native, in good repair. No buccal or palatal lesions. Posterior pharynx clear. Eyes: Conjunctivae and sclera clear. EOM intact. Pupils are equal, round, and reactive to light. Right eye exhibits no discharge. Left eye exhibits no discharge. Neck: Normal range of motion. Neck supple. No JVD present. No tracheal deviation present. No thyromegaly present.  Cardiovascular: Normal rate, regular rhythm, no gallop, no friction rub, no murmur heard.      Quiet precordium. 2+ radial and DP pulses . No carotid bruits Pulmonary/Chest: Effort normal. No respiratory distress or increased WOB, no wheezes, no rales. No chest wall deformity or CVAT. Abdomen: Soft. Bowel sounds are normal in all quadrants. He exhibits no distension, no tenderness, no rebound or guarding, No heptosplenomegaly .Scar RUQ Genitourinary:  deferred Musculoskeletal: Normal range of motion. He exhibits no edema and no tenderness.       Small and large joints  without redness, synovial thickening or deformity. Full range of motion preserved about all small, median and large joints.  Lymphadenopathy:    He has no cervical or supraclavicular adenopathy.  Neurological: He is alert and oriented to person, place, and time. CN II-XII intact. DTRs 2+ and symmetrical biceps, radial and patellar tendons. Cerebellar function normal with no tremor, rigidity, normal gait and station.  Skin: Skin is warm and dry. No rash noted. No erythema.  Psychiatric: He has a normal mood and affect. His behavior is normal. Thought content normal.   Lab Results  Component Value Date  HGBA1C 6.7* 05/22/2012   CXR: IMPRESSION:  Stable mild chronic lung disease. No acute process or evidence of  metastatic disease.       Assessment & Plan:  Transient High blood pressure with vison change - see note Sept 30, '13. Has had subsequent evaluation with 2 D echo - normal and carotid doppler - normal. He has had no further episodes of high BP or visual change.

## 2012-05-22 NOTE — Assessment & Plan Note (Signed)
Recent flare of low back pain October 19th. Reviewed x-rays from Urgent Care - minimal changes w/o significant degenerative joint disease. He is doing back stretches and has been using skelaxin.  Plan Continue stretch/flex  Change to robaxin

## 2012-05-22 NOTE — Assessment & Plan Note (Signed)
Long term chronic problem. Genotype 1, stable elvation LFTS. Follows with Dr. Kinnie Scales - has had inflammation on liver biopsy. Has been seen at [facility] by Dr. Corky Sing. He is a candidate for curative treatment and at this time is listed for participation in any new appropriate trials and also for treatment when current experimental treatments become main-stream. He will follow up in 1 year or sooner as opportunity arises.

## 2012-05-22 NOTE — Assessment & Plan Note (Signed)
EKG h/o normal sinus rhythm with PAC's. Normal exam today.  Plan -  No further diagnostic at this time.

## 2012-05-22 NOTE — Assessment & Plan Note (Signed)
Very stable - surgery in 1999. He has had no sequellae. CXR today is clear.  Plan  Continued surveilance

## 2012-05-22 NOTE — Assessment & Plan Note (Signed)
Lab Results  Component Value Date   HGBA1C 6.7* 05/22/2012   Definitive for DM but continues to be below threshold for medical management  Plan  Continue life style management: no sugar, low carb diet and regular exercise.

## 2012-05-22 NOTE — Patient Instructions (Addendum)
Normal exam full report to follow. Lab today A1C and a chest x-ray.

## 2012-05-24 ENCOUNTER — Encounter: Payer: Self-pay | Admitting: Internal Medicine

## 2012-05-30 NOTE — Progress Notes (Signed)
Reviewed and agree.

## 2012-08-29 ENCOUNTER — Encounter: Payer: Self-pay | Admitting: Gastroenterology

## 2012-09-11 ENCOUNTER — Other Ambulatory Visit: Payer: Self-pay | Admitting: Orthopedic Surgery

## 2012-09-11 MED ORDER — DEXAMETHASONE SODIUM PHOSPHATE 10 MG/ML IJ SOLN: 10.0000 mg | Freq: Once | INTRAMUSCULAR | Status: DC

## 2012-09-11 NOTE — Progress Notes (Signed)
Preoperative surgical orders have been place into the Epic hospital system for Timothy Dickson on 09/11/2012, 5:55 PM  by Patrica Duel for surgery on 10/10/2012.  Preop Knee Scope orders including IV Tylenol and IV Decadron as long as there are no contraindications to the above medications. Avel Peace, PA-C

## 2012-10-01 ENCOUNTER — Encounter (HOSPITAL_COMMUNITY): Payer: Self-pay | Admitting: Pharmacy Technician

## 2012-10-04 ENCOUNTER — Encounter: Payer: Self-pay | Admitting: Internal Medicine

## 2012-10-04 ENCOUNTER — Ambulatory Visit (INDEPENDENT_AMBULATORY_CARE_PROVIDER_SITE_OTHER): Payer: 59 | Admitting: Internal Medicine

## 2012-10-04 VITALS — BP 152/100 | HR 59 | Temp 97.5°F | Resp 12 | Wt 209.0 lb

## 2012-10-04 DIAGNOSIS — B1710 Acute hepatitis C without hepatic coma: Secondary | ICD-10-CM

## 2012-10-04 DIAGNOSIS — E1159 Type 2 diabetes mellitus with other circulatory complications: Secondary | ICD-10-CM | POA: Insufficient documentation

## 2012-10-04 DIAGNOSIS — I10 Essential (primary) hypertension: Secondary | ICD-10-CM

## 2012-10-04 DIAGNOSIS — I152 Hypertension secondary to endocrine disorders: Secondary | ICD-10-CM | POA: Insufficient documentation

## 2012-10-04 MED ORDER — FUROSEMIDE 20 MG PO TABS: 20.0000 mg | ORAL_TABLET | Freq: Every day | ORAL | Status: DC

## 2012-10-04 NOTE — Patient Instructions (Addendum)
Elevated blood pressure that is now over the treatment threshold  Plan  Furosemide 20 mg once a day (diuretic therapy)  Takes about 14-21 days to see full effect.  Monitor BP at home and report back if not dropping to 140's /80's.   Next step if not controlled is the addition of a calcium channel blocker, i.e. Amlodipine.  Hypertension As your heart beats, it forces blood through your arteries. This force is your blood pressure. If the pressure is too high, it is called hypertension (HTN) or high blood pressure. HTN is dangerous because you may have it and not know it. High blood pressure may mean that your heart has to work harder to pump blood. Your arteries may be narrow or stiff. The extra work puts you at risk for heart disease, stroke, and other problems.  Blood pressure consists of two numbers, a higher number over a lower, 110/72, for example. It is stated as "110 over 72." The ideal is below 120 for the top number (systolic) and under 80 for the bottom (diastolic). Write down your blood pressure today. You should pay close attention to your blood pressure if you have certain conditions such as:  Heart failure.  Prior heart attack.  Diabetes  Chronic kidney disease.  Prior stroke.  Multiple risk factors for heart disease. To see if you have HTN, your blood pressure should be measured while you are seated with your arm held at the level of the heart. It should be measured at least twice. A one-time elevated blood pressure reading (especially in the Emergency Department) does not mean that you need treatment. There may be conditions in which the blood pressure is different between your right and left arms. It is important to see your caregiver soon for a recheck. Most people have essential hypertension which means that there is not a specific cause. This type of high blood pressure may be lowered by changing lifestyle factors such as:  Stress.  Smoking.  Lack of  exercise.  Excessive weight.  Drug/tobacco/alcohol use.  Eating less salt. Most people do not have symptoms from high blood pressure until it has caused damage to the body. Effective treatment can often prevent, delay or reduce that damage. TREATMENT  When a cause has been identified, treatment for high blood pressure is directed at the cause. There are a large number of medications to treat HTN. These fall into several categories, and your caregiver will help you select the medicines that are best for you. Medications may have side effects. You should review side effects with your caregiver. If your blood pressure stays high after you have made lifestyle changes or started on medicines,   Your medication(s) may need to be changed.  Other problems may need to be addressed.  Be certain you understand your prescriptions, and know how and when to take your medicine.  Be sure to follow up with your caregiver within the time frame advised (usually within two weeks) to have your blood pressure rechecked and to review your medications.  If you are taking more than one medicine to lower your blood pressure, make sure you know how and at what times they should be taken. Taking two medicines at the same time can result in blood pressure that is too low. SEEK IMMEDIATE MEDICAL CARE IF:  You develop a severe headache, blurred or changing vision, or confusion.  You have unusual weakness or numbness, or a faint feeling.  You have severe chest or abdominal pain, vomiting,  or breathing problems. MAKE SURE YOU:   Understand these instructions.  Will watch your condition.  Will get help right away if you are not doing well or get worse. Document Released: 07/18/2005 Document Revised: 10/10/2011 Document Reviewed: 03/07/2008 [facility] Patient Information 2013 [place], [place].

## 2012-10-04 NOTE — Assessment & Plan Note (Signed)
Elevated blood pressure that is now over the treatment threshold  Plan  Furosemide 20 mg once a day (diuretic therapy)  Takes about 14-21 days to see full effect.  Monitor BP at home and report back if not dropping to 140's /80's.   Next step if not controlled is the addition of a calcium channel blocker, i.e. Amlodipine.

## 2012-10-05 ENCOUNTER — Encounter (HOSPITAL_COMMUNITY): Payer: Self-pay

## 2012-10-05 ENCOUNTER — Encounter (HOSPITAL_COMMUNITY)
Admission: RE | Admit: 2012-10-05 | Discharge: 2012-10-05 | Disposition: A | Discharge status: Home / Self Care | Payer: 59 | Source: Ambulatory Visit | Attending: Orthopedic Surgery | Admitting: Orthopedic Surgery

## 2012-10-05 HISTORY — DX: Essential (primary) hypertension: I10

## 2012-10-05 HISTORY — DX: Gastro-esophageal reflux disease without esophagitis: K21.9

## 2012-10-05 HISTORY — DX: Headache: R51

## 2012-10-05 LAB — SURGICAL PCR SCREEN
MRSA, PCR: NEGATIVE
Staphylococcus aureus: NEGATIVE

## 2012-10-05 LAB — BASIC METABOLIC PANEL
BUN: 17 mg/dL (ref 6–23)
CO2: 29 mEq/L (ref 19–32)
Calcium: 9.3 mg/dL (ref 8.4–10.5)
Chloride: 97 mEq/L (ref 96–112)
Creatinine, Ser: 0.99 mg/dL (ref 0.50–1.35)
GFR calc Af Amer: >90 mL/min (ref >90)
GFR calc non Af Amer: 83 mL/min — ABNORMAL LOW (ref >90)
Glucose, Bld: 105 mg/dL — ABNORMAL HIGH (ref 70–99)
Potassium: 4.3 mEq/L (ref 3.5–5.1)
Sodium: 136 mEq/L (ref 135–145)

## 2012-10-05 LAB — CBC
HCT: 43.4 % (ref 39.0–52.0)
Hemoglobin: 14.8 g/dL (ref 13.0–17.0)
MCH: 31.2 pg (ref 26.0–34.0)
MCHC: 34.1 g/dL (ref 30.0–36.0)
MCV: 91.6 fL (ref 78.0–100.0)
Platelets: 203 10*3/uL (ref 150–400)
RBC: 4.74 MIL/uL (ref 4.22–5.81)
RDW: 13.2 % (ref 11.5–15.5)
WBC: 7.1 10*3/uL (ref 4.0–10.5)

## 2012-10-05 NOTE — Patient Instructions (Signed)
[address] [facility]  10/05/2012   Your procedure is scheduled on: 10/10/12  Report to [facility] at 0745 AM.  Call this number if you have problems the morning of surgery 336-: [phone]   Remember:   Do not eat food or drink liquids After Midnight.     Take these medicines the morning of surgery with A SIP OF WATER:  prilosec   Do not wear jewelry, make-up or nail polish.  Do not wear lotions, powders, or perfumes. You may wear deodorant.  Do not shave 48 hours prior to surgery. Men may shave face and neck.  Do not bring valuables to the hospital.  Contacts, dentures or bridgework may not be worn into surgery.     Patients discharged the day of surgery will not be allowed to drive home.  Name and phone number of your driver: Lorelle Formosa [phone]     Please read over the following fact sheets that you were given: MRSA Information, incentive spirometer fact sheet  Birdie Sons, RN  pre op nurse call if needed [phone]    FAILURE TO FOLLOW THESE INSTRUCTIONS MAY RESULT IN CANCELLATION OF YOUR SURGERY   Patient Signature: ___________________________________________

## 2012-10-05 NOTE — Progress Notes (Signed)
Chest x-ray 05/24/12 on EPIC

## 2012-10-06 NOTE — Progress Notes (Signed)
Subjective:    Patient ID: Timothy Dickson, male    DOB: August 19, 1945, 67 y.o.   MRN: [id number]  HPI Timothy Dickson presents for evaluation of hypertension. He has brought several BP readings with him and the majority are greater than SBP 150+. He has been asymptomatic. This has been a slowly evolving problem.  He reports that he was recently reevaulated at [facility] in regard to his Hep C and progression of liver disease.  Past Medical History  Diagnosis Date  . Osteoarthritis, knee   . Diverticulosis of colon     mild  . History of substance abuse   . Family history of renal cell carcinoma   . Hyperglycemia   . History of BPH   . Hypertension   . Cardiac dysrhythmia, unspecified     rapid  . Pneumonia 1975    hx of 35 years ago  . GERD (gastroesophageal reflux disease)   . Headache   . Cancer     right kidney  . Acute hepatitis C without mention of hepatic coma    Past Surgical History  Procedure Laterality Date  . Arthroscopy, left knee,    . Left shoulder repair for bone spur  1990's  . History of liver biopsy  2007  . Transurethral needle ablation procedure    . Tuna procedure    . History of nephrectomy Right 1999  . Arthroscopy right knee      may '11 (Timothy Dickson)   Family History  Problem Relation Age of Onset  . Coronary artery disease Mother   . Cancer Brother     Prostate cancer  . Alcohol abuse Other     Strong family Hx  . Diabetes Sister   . Cancer Sister     breast cancer, lung cancer, esophageal   History   Social History  . Marital Status: Married    Spouse Name: N/A    Number of Children: N/A  . Years of Education: N/A   Occupational History  . Not on file.   Social History Main Topics  . Smoking status: Former Smoker -- 1.00 packs/day for 15 years    Types: Cigarettes    Quit date: 08/01/1977  . Smokeless tobacco: Never Used  . Alcohol Use: No  . Drug Use: No  . Sexually Active: Yes -- Male partner(s)   Other Topics Concern  . Not on file    Social History Narrative   Timothy Dickson BA, Timothy Dickson, Timothy Dickson. Married- '84. 2 step-daughters, 4 grandchildren. 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 Marriage in good health.    Current Outpatient Prescriptions on File Prior to Visit  Medication Sig Dispense Refill  . aspirin EC 81 MG tablet Take 81 mg by mouth daily.       [name] KRILL OIL PO Take 1 tablet by mouth daily.        . Nutritional Supplements (JUICE PLUS FIBRE PO) Take by mouth.      [name] omeprazole (PRILOSEC) 20 MG capsule Take 20 mg by mouth daily.       . vitamin B-12 (CYANOCOBALAMIN) 1000 MCG tablet Take 3,000 mcg by mouth daily.       Current Facility-Administered Medications on File Prior to Visit  Medication Dose Route Frequency Provider Last Rate Last Dose  . TDaP (BOOSTRIX) injection 0.5 mL  0.5 mL Intramuscular Once Timothy Navy, MD          Review  of Systems System review is negative for any constitutional, cardiac, pulmonary, GI or neuro symptoms or complaints other than as described in the HPI.     Objective:   Physical Exam Filed Vitals:   10/04/12 1442  BP: 152/100  Pulse: 59  Temp: 97.5 F (36.4 C)  Resp: 12   BP Readings from Last 3 Encounters:  10/05/12 141/83  10/04/12 152/100  05/22/12 132/80   Gen'l- WNWD whtie man in no distress HEENT - PERRLA, Fundi - no AV nicking, no copper wiring, normal disk margins. Cor - RRR Pulm - normal respirations.       Assessment & Plan:

## 2012-10-06 NOTE — Assessment & Plan Note (Signed)
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 He does remain on the call back list for new, non-interferon, therapies when available.

## 2012-10-09 NOTE — H&P (Signed)
  CC- Timothy Dickson is a 67 y.o. male who presents with left knee pain.  HPI- . Knee Pain: Patient presents with knee pain involving the  left knee. Onset of the symptoms was several months ago. Inciting event: none known. Current symptoms include giving out, pain located medially, stiffness and swelling. Pain is aggravated by lateral movements, rising after sitting, squatting and standing.  Patient has had prior knee problems. Evaluation to date: MRI: medial meniscal tear. Treatment to date: injections which were not beneficial.  Past Medical History  Diagnosis Date  . Osteoarthritis, knee   . Diverticulosis of colon     mild  . History of substance abuse   . Family history of renal cell carcinoma   . Hyperglycemia   . History of BPH   . Hypertension   . Cardiac dysrhythmia, unspecified     rapid  . Pneumonia 1975    hx of 35 years ago  . GERD (gastroesophageal reflux disease)   . Headache   . Cancer     right kidney  . 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 Arthroscopy, left knee,    . Left shoulder repair for bone spur  1990's  . History of liver biopsy  2007  . Transurethral needle ablation procedure    . Tuna procedure    . History of nephrectomy Right 1999  . Arthroscopy right knee      may '11 (Dr Charlann Boxer)    Prior to Admission medications   Medication Sig Start Date End Date Taking? Authorizing Provider  aspirin EC 81 MG tablet Take 81 mg by mouth daily.     Historical Provider, MD  furosemide (LASIX) 20 MG tablet Take 1 tablet (20 mg total) by mouth daily. 10/04/12   Jacques Navy, MD  KRILL OIL PO Take 1 tablet by mouth daily.      Historical Provider, MD  naproxen sodium (ANAPROX) 220 MG tablet Take 220 mg by mouth 2 (two) times daily as needed.    Historical Provider, MD  Nutritional Supplements (JUICE PLUS FIBRE PO) Take by mouth.    Historical Provider, MD  omeprazole (PRILOSEC) 20 MG capsule  Take 20 mg by mouth daily.     Historical Provider, MD  vitamin B-12 (CYANOCOBALAMIN) 1000 MCG tablet Take 3,000 mcg by mouth daily.    Historical Provider, MD   KNEE EXAM antalgic gait, no effusion, ligaments intact, tender medial joint line  Physical Examination: General appearance - alert, well appearing, and in no distress Mental status - alert, oriented to person, place, and time Chest - clear to auscultation, no wheezes, rales or rhonchi, symmetric air entry Heart - normal rate, regular rhythm, normal S1, S2, no murmurs, rubs, clicks or gallops Abdomen - soft, nontender, nondistended, no masses or organomegaly Neurological - alert, oriented, normal speech, no focal findings or movement disorder noted   Asessment/Plan--- Left knee medial meniscal tear- - Plan left knee arthroscopy with meniscal debridement. Procedure risks and potential comps discussed with patient who elects to proceed. Goals are decreased pain and increased function with a high likelihood of achieving both

## 2012-10-10 ENCOUNTER — Encounter (HOSPITAL_COMMUNITY): Payer: Self-pay | Admitting: *Deleted

## 2012-10-10 ENCOUNTER — Encounter (HOSPITAL_COMMUNITY): Payer: Self-pay | Admitting: Anesthesiology

## 2012-10-10 ENCOUNTER — Ambulatory Visit (HOSPITAL_COMMUNITY): Payer: 59 | Admitting: Anesthesiology

## 2012-10-10 ENCOUNTER — Ambulatory Visit (HOSPITAL_COMMUNITY)
Admission: RE | Admit: 2012-10-10 | Discharge: 2012-10-10 | Disposition: A | Discharge status: Home / Self Care | Payer: 59 | Source: Ambulatory Visit | Attending: Orthopedic Surgery | Admitting: Orthopedic Surgery

## 2012-10-10 ENCOUNTER — Encounter (HOSPITAL_COMMUNITY)
Admission: RE | Disposition: A | Discharge status: Home / Self Care | Payer: Self-pay | Source: Ambulatory Visit | Attending: Orthopedic Surgery

## 2012-10-10 DIAGNOSIS — Z01812 Encounter for preprocedural laboratory examination: Secondary | ICD-10-CM | POA: Insufficient documentation

## 2012-10-10 DIAGNOSIS — B1920 Unspecified viral hepatitis C without hepatic coma: Secondary | ICD-10-CM | POA: Insufficient documentation

## 2012-10-10 DIAGNOSIS — Z7982 Long term (current) use of aspirin: Secondary | ICD-10-CM | POA: Insufficient documentation

## 2012-10-10 DIAGNOSIS — Z01810 Encounter for preprocedural cardiovascular examination: Secondary | ICD-10-CM | POA: Insufficient documentation

## 2012-10-10 DIAGNOSIS — K219 Gastro-esophageal reflux disease without esophagitis: Secondary | ICD-10-CM | POA: Insufficient documentation

## 2012-10-10 DIAGNOSIS — Z79899 Other long term (current) drug therapy: Secondary | ICD-10-CM | POA: Insufficient documentation

## 2012-10-10 DIAGNOSIS — S83249A Other tear of medial meniscus, current injury, unspecified knee, initial encounter: Secondary | ICD-10-CM

## 2012-10-10 DIAGNOSIS — Z85528 Personal history of other malignant neoplasm of kidney: Secondary | ICD-10-CM | POA: Insufficient documentation

## 2012-10-10 DIAGNOSIS — S83242D Other tear of medial meniscus, current injury, left knee, subsequent encounter: Secondary | ICD-10-CM

## 2012-10-10 DIAGNOSIS — I10 Essential (primary) hypertension: Secondary | ICD-10-CM | POA: Insufficient documentation

## 2012-10-10 DIAGNOSIS — Z905 Acquired absence of kidney: Secondary | ICD-10-CM | POA: Insufficient documentation

## 2012-10-10 DIAGNOSIS — M23329 Other meniscus derangements, posterior horn of medial meniscus, unspecified knee: Secondary | ICD-10-CM | POA: Insufficient documentation

## 2012-10-10 HISTORY — PX: KNEE ARTHROSCOPY: SHX127

## 2012-10-10 SURGERY — ARTHROSCOPY, KNEE
Anesthesia: General | Site: Knee | Laterality: Left | Wound class: Clean

## 2012-10-10 MED ORDER — ONDANSETRON HCL 4 MG/2ML IJ SOLN
INTRAMUSCULAR | Status: DC | PRN
  Administered 2012-10-10: 4 mg via INTRAVENOUS

## 2012-10-10 MED ORDER — LACTATED RINGERS IR SOLN
Status: DC | PRN
  Administered 2012-10-10: 6000 mL

## 2012-10-10 MED ORDER — FENTANYL CITRATE 0.05 MG/ML IJ SOLN
INTRAMUSCULAR | Status: DC | PRN
  Administered 2012-10-10 (×3): 50 ug via INTRAVENOUS

## 2012-10-10 MED ORDER — ACETAMINOPHEN 10 MG/ML IV SOLN: 1000.0000 mg | Freq: Once | INTRAVENOUS | Status: DC

## 2012-10-10 MED ORDER — CEFAZOLIN SODIUM-DEXTROSE 2-3 GM-% IV SOLR
INTRAVENOUS | Status: AC
  Filled 2012-10-10: qty 50

## 2012-10-10 MED ORDER — LACTATED RINGERS IV SOLN: INTRAVENOUS | Status: DC

## 2012-10-10 MED ORDER — BUPIVACAINE-EPINEPHRINE 0.25% -1:200000 IJ SOLN
INTRAMUSCULAR | Status: DC | PRN
  Administered 2012-10-10: 20 mL

## 2012-10-10 MED ORDER — PROPOFOL 10 MG/ML IV EMUL
INTRAVENOUS | Status: DC | PRN
  Administered 2012-10-10: 175 mg via INTRAVENOUS

## 2012-10-10 MED ORDER — FENTANYL CITRATE 0.05 MG/ML IJ SOLN: 25.0000 ug | INTRAMUSCULAR | Status: DC | PRN

## 2012-10-10 MED ORDER — EPHEDRINE SULFATE 50 MG/ML IJ SOLN
INTRAMUSCULAR | Status: DC | PRN
  Administered 2012-10-10: 10 mg via INTRAVENOUS

## 2012-10-10 MED ORDER — MIDAZOLAM HCL 5 MG/5ML IJ SOLN
INTRAMUSCULAR | Status: DC | PRN
  Administered 2012-10-10 (×2): 1 mg via INTRAVENOUS

## 2012-10-10 MED ORDER — HYDROMORPHONE HCL PF 1 MG/ML IJ SOLN
0.2500 mg | INTRAMUSCULAR | Status: DC | PRN
  Administered 2012-10-10 (×2): 0.5 mg via INTRAVENOUS

## 2012-10-10 MED ORDER — LACTATED RINGERS IV SOLN
INTRAVENOUS | Status: DC
  Administered 2012-10-10: 1000 mL via INTRAVENOUS
  Administered 2012-10-10: 11:00:00 via INTRAVENOUS

## 2012-10-10 MED ORDER — CEFAZOLIN SODIUM-DEXTROSE 2-3 GM-% IV SOLR
2.0000 g | INTRAVENOUS | Status: AC
  Administered 2012-10-10: 2 g via INTRAVENOUS

## 2012-10-10 MED ORDER — OXYCODONE HCL 5 MG PO TABS: 5.0000 mg | ORAL_TABLET | ORAL | Status: DC | PRN

## 2012-10-10 MED ORDER — HYDROMORPHONE HCL PF 1 MG/ML IJ SOLN
INTRAMUSCULAR | Status: AC
  Filled 2012-10-10: qty 1

## 2012-10-10 MED ORDER — PROMETHAZINE HCL 25 MG/ML IJ SOLN: 6.2500 mg | INTRAMUSCULAR | Status: DC | PRN

## 2012-10-10 MED ORDER — ACETAMINOPHEN 10 MG/ML IV SOLN
INTRAVENOUS | Status: AC
  Filled 2012-10-10: qty 100

## 2012-10-10 MED ORDER — SODIUM CHLORIDE 0.9 % IV SOLN: INTRAVENOUS | Status: DC

## 2012-10-10 MED ORDER — METHOCARBAMOL 500 MG PO TABS: 500.0000 mg | ORAL_TABLET | Freq: Four times a day (QID) | ORAL | Status: DC

## 2012-10-10 MED ORDER — OXYCODONE HCL 5 MG PO TABS
10.0000 mg | ORAL_TABLET | Freq: Once | ORAL | Status: AC
  Administered 2012-10-10: 10 mg via ORAL
  Filled 2012-10-10: qty 2

## 2012-10-10 MED ORDER — LIDOCAINE HCL (CARDIAC) 20 MG/ML IV SOLN
INTRAVENOUS | Status: DC | PRN
  Administered 2012-10-10: 75 mg via INTRAVENOUS

## 2012-10-10 MED ORDER — MEPERIDINE HCL 50 MG/ML IJ SOLN: 6.2500 mg | INTRAMUSCULAR | Status: DC | PRN

## 2012-10-10 MED ORDER — BUPIVACAINE-EPINEPHRINE PF 0.25-1:200000 % IJ SOLN
INTRAMUSCULAR | Status: AC
  Filled 2012-10-10: qty 30

## 2012-10-10 SURGICAL SUPPLY — 24 items
BANDAGE ELASTIC 6 VELCRO ST LF (GAUZE/BANDAGES/DRESSINGS) ×2 IMPLANT
BLADE 4.2CUDA (BLADE) ×2 IMPLANT
CLOTH BEACON ORANGE TIMEOUT ST (SAFETY) ×2 IMPLANT
CUFF TOURN SGL QUICK 34 (TOURNIQUET CUFF) ×1
CUFF TRNQT CYL 34X4X40X1 (TOURNIQUET CUFF) ×1 IMPLANT
DRAPE U-SHAPE 47X51 STRL (DRAPES) ×2 IMPLANT
DRSG EMULSION OIL 3X3 NADH (GAUZE/BANDAGES/DRESSINGS) ×2 IMPLANT
DURAPREP 26ML APPLICATOR (WOUND CARE) ×2 IMPLANT
GLOVE BIO SURGEON STRL SZ8 (GLOVE) ×2 IMPLANT
GLOVE BIOGEL PI IND STRL 8 (GLOVE) ×1 IMPLANT
GLOVE BIOGEL PI INDICATOR 8 (GLOVE) ×1
GOWN STRL NON-REIN LRG LVL3 (GOWN DISPOSABLE) ×2 IMPLANT
MANIFOLD NEPTUNE II (INSTRUMENTS) ×4 IMPLANT
PACK ARTHROSCOPY WL (CUSTOM PROCEDURE TRAY) ×2 IMPLANT
PACK ICE MAXI GEL EZY WRAP (MISCELLANEOUS) ×6 IMPLANT
PADDING CAST COTTON 6X4 STRL (CAST SUPPLIES) ×2 IMPLANT
POSITIONER SURGICAL ARM (MISCELLANEOUS) ×2 IMPLANT
SET ARTHROSCOPY TUBING (MISCELLANEOUS) ×1
SET ARTHROSCOPY TUBING LN (MISCELLANEOUS) ×1 IMPLANT
SPONGE GAUZE 4X4 12PLY (GAUZE/BANDAGES/DRESSINGS) ×2 IMPLANT
SUT ETHILON 4 0 PS 2 18 (SUTURE) ×2 IMPLANT
TOWEL OR 17X26 10 PK STRL BLUE (TOWEL DISPOSABLE) ×2 IMPLANT
WAND 90 DEG TURBOVAC W/CORD (SURGICAL WAND) ×2 IMPLANT
WRAP KNEE MAXI GEL POST OP (GAUZE/BANDAGES/DRESSINGS) ×4 IMPLANT

## 2012-10-10 NOTE — Transfer of Care (Signed)
Immediate Anesthesia Transfer of Care Note  Patient: Timothy Dickson  Procedure(s) Performed: Procedure(s): LEFT KNEE ARTHROSCOPY WITH MENSCIAL DEBRIDEMENT AND CHONDROPLASTY (Left)  Patient Location: PACU  Anesthesia Type:General  Level of Consciousness: awake, oriented, patient cooperative, lethargic and responds to stimulation  Airway & Oxygen Therapy: Patient Spontanous Breathing and Patient connected to face mask oxygen  Post-op Assessment: Report given to PACU RN, Post -op Vital signs reviewed and stable and Patient moving all extremities  Post vital signs: Reviewed and stable  Complications: No apparent anesthesia complications

## 2012-10-10 NOTE — Anesthesia Preprocedure Evaluation (Signed)
Anesthesia Evaluation  Patient identified by MRN, date of birth, ID band Patient awake    Reviewed: Allergy & Precautions, H&P , NPO status , Patient's Chart, lab work & pertinent test results  Airway Mallampati: II TM Distance: >3 FB Neck ROM: Full    Dental no notable dental hx. (+) Partial Upper   Pulmonary neg pulmonary ROS, neg pneumonia -,  breath sounds clear to auscultation  Pulmonary exam normal       Cardiovascular hypertension, Pt. on medications Rhythm:Regular Rate:Normal     Neuro/Psych negative neurological ROS  negative psych ROS   GI/Hepatic GERD-  Medicated and Controlled,(+) Hepatitis -, C  Endo/Other  negative endocrine ROS  Renal/GU negative Renal ROS  negative genitourinary   Musculoskeletal negative musculoskeletal ROS (+)   Abdominal   Peds negative pediatric ROS (+)  Hematology negative hematology ROS (+)   Anesthesia Other Findings   Reproductive/Obstetrics negative OB ROS                           Anesthesia Physical Anesthesia Plan  ASA: II  Anesthesia Plan: General   Post-op Pain Management:    Induction: Intravenous  Airway Management Planned: LMA  Additional Equipment:   Intra-op Plan:   Post-operative Plan:   Informed Consent: I have reviewed the patients History and Physical, chart, labs and discussed the procedure including the risks, benefits and alternatives for the proposed anesthesia with the patient or authorized representative who has indicated his/her understanding and acceptance.   Dental advisory given  Plan Discussed with: CRNA  Anesthesia Plan Comments:         Anesthesia Quick Evaluation

## 2012-10-10 NOTE — Anesthesia Postprocedure Evaluation (Signed)
  Anesthesia Post-op Note  Patient: Timothy Dickson  Procedure(s) Performed: Procedure(s) (LRB): LEFT KNEE ARTHROSCOPY WITH MENSCIAL DEBRIDEMENT AND CHONDROPLASTY (Left)  Patient Location: PACU  Anesthesia Type: General  Level of Consciousness: awake and alert   Airway and Oxygen Therapy: Patient Spontanous Breathing  Post-op Pain: mild  Post-op Assessment: Post-op Vital signs reviewed, Patient's Cardiovascular Status Stable, Respiratory Function Stable, Patent Airway and No signs of Nausea or vomiting  Last Vitals:  Filed Vitals:   10/10/12 1155  BP: 133/89  Pulse:   Temp: 36.5 C  Resp: 14    Post-op Vital Signs: stable   Complications: No apparent anesthesia complications

## 2012-10-10 NOTE — Preoperative (Signed)
Beta Blockers   Reason not to administer Beta Blockers:Not Applicable 

## 2012-10-10 NOTE — Interval H&P Note (Signed)
History and Physical Interval Note:  10/10/2012 9:55 AM  Timothy Dickson  has presented today for surgery, with the diagnosis of LEFT KNEE MEDIAL MENISCAL TEAR  The various methods of treatment have been discussed with the patient and family. After consideration of risks, benefits and other options for treatment, the patient has consented to  Procedure(s) with comments: LEFT KNEE ARTHROSCOPY WITH DEBRIDEMENT (Left) - WITH DEBRIDEMENT as a surgical intervention .  The patient's history has been reviewed, patient examined, no change in status, stable for surgery.  I have reviewed the patient's chart and labs.  Questions were answered to the patient's satisfaction.     Loanne Drilling

## 2012-10-10 NOTE — Op Note (Signed)
Preoperative diagnosis-  Left knee medial meniscal tear  Postoperative diagnosis Left- knee medial meniscal tear   Plus Left medial femoral chondral defect  Procedure- Left knee arthroscopy with medial Meniscal debridement and chondroplasty   Surgeon- Gus Rankin. Corynne Scibilia, MD  Anesthesia-General  EBL-  minimal Complications- None  Condition- PACU - hemodynamically stable.  Brief clinical note- -Timothy Dickson is a 67 y.o.  male with a several month history of left knee pain and mechanical symptoms. Exam and history suggested medial meniscal tear confirmed by MRI. The patient presents now for arthroscopy and debridement   Procedure in detail -       After successful administration of General anesthetic, a tourmiquet is placed high on the Left  thigh and the Left lower extremity is prepped and draped in the usual sterile fashion. Time out is performed by the surgical team. Standard superomedial and inferolateral portal sites are marked and incisions made with an 11 blade. The inflow cannula is passed through the superomedial portal and camera through the inferolateral portal and inflow is initiated. Arthroscopic visualization proceeds.      The undersurface of the patella and trochlea are visualized and there is minimal chondromalacia. The medial and lateral gutters are visualized and there are  no loose bodies. Flexion and valgus force is applied to the knee and the medial compartment is entered. A spinal needle is passed into the joint through the site marked for the inferomedial portal. A small incision is made and the dilator passed into the joint. The findings for the medial compartment are degenerative tear body and posterior horn medial meniscus and chondral defect medial femoral condyle and tibial plateau. The medial femoral condyle had grade III chondromalacia in a 2 x 2 cm area with an area of unstable cartilage at the rim. The medial tibial plateau had a 0.5 x 1 cm area of exposed bone  with a stable rim . The tear is debrided to a stable base with baskets and a shaver and sealed off with the Arthrocare. The shaver is used to debride the unstable cartilage to a stable cartilaginous base with stable edges. It is probed and found to be stable.    The intercondylar notch is visualized and the ACL appears normal, attenuated, torn). The lateral compartment is entered and the findings are normal .      The joint is again inspected and there are no other tears, defects or loose bodies identified. The arthroscopic equipment is then removed from the inferior portals which are closed with interrupted 4-0 nylon. 20 ml of .25% Marcaine with epinephrine are injected through the inflow cannula and the cannula is then removed and the portal closed with nylon. The incisions are cleaned and dried and a bulky sterile dressing is applied. The patient is then awakened and transported to recovery in stable condition.   10/10/2012, 10:58 AM

## 2012-10-11 ENCOUNTER — Encounter (HOSPITAL_COMMUNITY): Payer: Self-pay | Admitting: Orthopedic Surgery

## 2012-11-06 ENCOUNTER — Other Ambulatory Visit (INDEPENDENT_AMBULATORY_CARE_PROVIDER_SITE_OTHER): Payer: 59

## 2012-11-06 DIAGNOSIS — R634 Abnormal weight loss: Secondary | ICD-10-CM

## 2012-11-06 DIAGNOSIS — I10 Essential (primary) hypertension: Secondary | ICD-10-CM

## 2012-11-06 LAB — CBC WITH DIFFERENTIAL/PLATELET
Basophils Absolute: 0 10*3/uL (ref 0.0–0.1)
Basophils Relative: 0.6 % (ref 0.0–3.0)
Eosinophils Absolute: 0.3 10*3/uL (ref 0.0–0.7)
Eosinophils Relative: 4 % (ref 0.0–5.0)
HCT: 43.3 % (ref 39.0–52.0)
Hemoglobin: 14.7 g/dL (ref 13.0–17.0)
Lymphocytes Relative: 23 % (ref 12.0–46.0)
Lymphs Abs: 1.5 10*3/uL (ref 0.7–4.0)
MCHC: 33.8 g/dL (ref 30.0–36.0)
MCV: 92.4 fl (ref 78.0–100.0)
Monocytes Absolute: 0.7 10*3/uL (ref 0.1–1.0)
Monocytes Relative: 10.6 % (ref 3.0–12.0)
Neutro Abs: 4.2 10*3/uL (ref 1.4–7.7)
Neutrophils Relative %: 61.8 % (ref 43.0–77.0)
Platelets: 188 10*3/uL (ref 150.0–400.0)
RBC: 4.69 Mil/uL (ref 4.22–5.81)
RDW: 13.9 % (ref 11.5–14.6)
WBC: 6.7 10*3/uL (ref 4.5–10.5)

## 2012-11-06 LAB — COMPREHENSIVE METABOLIC PANEL
ALT: 98 U/L — ABNORMAL HIGH (ref 0–53)
AST: 78 U/L — ABNORMAL HIGH (ref 0–37)
Albumin: 4.2 g/dL (ref 3.5–5.2)
Alkaline Phosphatase: 52 U/L (ref 39–117)
BUN: 16 mg/dL (ref 6–23)
CO2: 27 mEq/L (ref 19–32)
Calcium: 9.1 mg/dL (ref 8.4–10.5)
Chloride: 99 mEq/L (ref 96–112)
Creatinine, Ser: 1.1 mg/dL (ref 0.4–1.5)
GFR: 70.95 mL/min (ref >60.00)
Glucose, Bld: 168 mg/dL — ABNORMAL HIGH (ref 70–99)
Potassium: 4 mEq/L (ref 3.5–5.1)
Sodium: 136 mEq/L (ref 135–145)
Total Bilirubin: 1 mg/dL (ref 0.3–1.2)
Total Protein: 7.4 g/dL (ref 6.0–8.3)

## 2012-11-06 LAB — HEMOGLOBIN A1C: Hgb A1c MFr Bld: 6.7 % — ABNORMAL HIGH (ref 4.6–6.5)

## 2012-11-12 ENCOUNTER — Encounter: Payer: Self-pay | Admitting: Internal Medicine

## 2012-11-19 ENCOUNTER — Other Ambulatory Visit: Payer: Self-pay | Admitting: Gastroenterology

## 2012-11-19 DIAGNOSIS — B1920 Unspecified viral hepatitis C without hepatic coma: Secondary | ICD-10-CM

## 2012-11-20 ENCOUNTER — Other Ambulatory Visit: Payer: 59

## 2012-11-20 ENCOUNTER — Encounter: Payer: Self-pay | Admitting: Internal Medicine

## 2012-11-23 ENCOUNTER — Ambulatory Visit
Admission: RE | Admit: 2012-11-23 | Discharge: 2012-11-23 | Disposition: A | Discharge status: Home / Self Care | Payer: 59 | Source: Ambulatory Visit | Attending: Gastroenterology | Admitting: Gastroenterology

## 2012-11-23 DIAGNOSIS — B1920 Unspecified viral hepatitis C without hepatic coma: Secondary | ICD-10-CM

## 2012-11-27 ENCOUNTER — Telehealth: Payer: Self-pay | Admitting: Internal Medicine

## 2012-11-27 NOTE — Telephone Encounter (Signed)
Rec'd from [facility] forward 3 pages to Dr.Norins °

## 2013-02-13 ENCOUNTER — Other Ambulatory Visit: Payer: Self-pay | Admitting: Internal Medicine

## 2013-02-27 ENCOUNTER — Ambulatory Visit (INDEPENDENT_AMBULATORY_CARE_PROVIDER_SITE_OTHER): Payer: No Typology Code available for payment source | Admitting: Emergency Medicine

## 2013-02-27 ENCOUNTER — Ambulatory Visit: Payer: No Typology Code available for payment source

## 2013-02-27 VITALS — BP 118/78 | HR 82 | Temp 98.1°F | Resp 18 | Ht 72.5 in | Wt 206.0 lb

## 2013-02-27 DIAGNOSIS — S39012A Strain of muscle, fascia and tendon of lower back, initial encounter: Secondary | ICD-10-CM

## 2013-02-27 DIAGNOSIS — S335XXA Sprain of ligaments of lumbar spine, initial encounter: Secondary | ICD-10-CM

## 2013-02-27 DIAGNOSIS — M549 Dorsalgia, unspecified: Secondary | ICD-10-CM

## 2013-02-27 DIAGNOSIS — M5136 Other intervertebral disc degeneration, lumbar region: Secondary | ICD-10-CM

## 2013-02-27 DIAGNOSIS — M5137 Other intervertebral disc degeneration, lumbosacral region: Secondary | ICD-10-CM

## 2013-02-27 LAB — POCT URINALYSIS DIPSTICK
Bilirubin, UA: NEGATIVE
Blood, UA: NEGATIVE
Glucose, UA: NEGATIVE
Ketones, UA: NEGATIVE
Leukocytes, UA: NEGATIVE
Nitrite, UA: NEGATIVE
Protein, UA: NEGATIVE
Spec Grav, UA: 1.015
Urobilinogen, UA: 0.2
pH, UA: 7

## 2013-02-27 MED ORDER — HYDROCODONE-ACETAMINOPHEN 5-325 MG PO TABS: 1.0000 | ORAL_TABLET | Freq: Four times a day (QID) | ORAL | Status: DC | PRN

## 2013-02-27 MED ORDER — OXYCODONE HCL 5 MG PO TABS: 5.0000 mg | ORAL_TABLET | ORAL | Status: DC | PRN

## 2013-02-27 MED ORDER — CYCLOBENZAPRINE HCL 10 MG PO TABS: 10.0000 mg | ORAL_TABLET | Freq: Three times a day (TID) | ORAL | Status: DC | PRN

## 2013-02-27 MED ORDER — PREDNISONE 20 MG PO TABS: ORAL_TABLET | ORAL | Status: DC

## 2013-02-27 NOTE — Progress Notes (Signed)
Subjective:    Patient ID: Timothy Dickson, male    DOB: [birth year], 67 y.o.   MRN: [id number]  HPI 67 y.o. Caucasian male presents to clinic today for back pain x 9 days. Pt states that he was doing yoga last Monday and the blanket slipped and he scrapped his back down a metal chair. It was painful at the time but then got better but 2 days later the pain came back and has been consistent since. Pt says the pain is a 9/10. He has had injuries to his back in the past and says that this is different and much worse. He has tried Aleve, Motrin, Robaxin, heating pad,and Needling with only temporary relief. He states that it is more painful with standing and laying on his stomach. Most of his pain is in the lumbar spine area. He describe loss of sensation in his left lower extremity on the lateral aspect which he has noticed today as well as some pain down back of his left leg. He is not having issues moving his bowels or urinating, no numbness or tingling in his hands or radiation of pain up his spine. No pain with neck movement.   Review of Systems  Constitutional: Negative for fever, chills and activity change.  HENT: Negative for neck pain and neck stiffness.   Eyes: Negative for visual disturbance.  Respiratory: Negative for chest tightness and shortness of breath.   Cardiovascular: Negative for chest pain and leg swelling.  Gastrointestinal: Negative for nausea, vomiting, abdominal pain, diarrhea and constipation.  Genitourinary: Negative for hematuria and difficulty urinating.  Musculoskeletal: Positive for back pain and arthralgias.  Neurological: Negative for light-headedness and headaches.  All other systems reviewed and are negative.       Objective:   Physical Exam  Constitutional: He is oriented to person, place, and time. Vital signs are normal. He appears well-developed and well-nourished. No distress.  HENT:  Head: Normocephalic and atraumatic.  Right Ear: External ear normal.   Left Ear: External ear normal.  Nose: Nose normal.  Eyes: Conjunctivae and lids are normal.  Neck: Normal range of motion. Neck supple.  Cardiovascular: Normal rate, regular rhythm and normal heart sounds.   Pulmonary/Chest: Effort normal and breath sounds normal.  Abdominal: Normal appearance.  Musculoskeletal: Normal range of motion. He exhibits tenderness.       Lumbar back: He exhibits tenderness, bony tenderness and pain. He exhibits no swelling, no deformity and no spasm.  Negative straight leg test. Patient has decreased sensation on the left lower extremity more on lateral aspect. Reflexes and strength normal.   Neurological: He is alert and oriented to person, place, and time. He has normal strength and normal reflexes. No cranial nerve deficit or sensory deficit.  Skin: Skin is warm, dry and intact.  Psychiatric: He has a normal mood and affect. His speech is normal and behavior is normal. Judgment and thought content normal. Cognition and memory are normal.       Results for orders placed in visit on 02/27/13  POCT URINALYSIS DIPSTICK      Result Value Range   Color, UA yellow     Clarity, UA clear     Glucose, UA neg     Bilirubin, UA neg     Ketones, UA neg     Spec Grav, UA 1.015     Blood, UA neg     pH, UA 7.0     Protein, UA neg     Urobilinogen,  UA 0.2     Nitrite, UA neg     Leukocytes, UA Negative      Lumbar Spine Complete Primary read by Dr. Cleta Alberts. Degenerative changes in lumber spine. No other abnormalities seen.    Assessment & Plan:  1. Back pain - Plan: DG Lumbar Spine Complete, POCT urinalysis dipstick, predniSONE (DELTASONE) 20 MG tablet, cyclobenzaprine (FLEXERIL) 10 MG tablet, oxyCODONE (ROXICODONE) 5 MG immediate release tablet  2. Lumbar strain, initial encounter  3. DDD (degenerative disc disease), lumbar  Instructed patient to not take Robaxin, Ibuprofen, or Naproxen while taking the prednisone.  Told him he may resume those medications  once he completes the prednisone taper Told him to take the Oxycodone for as needed for pain  Take Flexeril as needed for muscle pain Return if symptoms are still present after completing prednisone or if they become worse

## 2013-02-27 NOTE — Patient Instructions (Signed)
Do not take Robaxin, Ibuprofen, or Naproxen while taking the prednisone. You may resume those medications once you complete the prednisone taper Take the Norco for as needed for pain  Take Flexeril as needed Return if symptoms are still present after completing prednisone or if they become worse

## 2013-03-09 ENCOUNTER — Ambulatory Visit (INDEPENDENT_AMBULATORY_CARE_PROVIDER_SITE_OTHER): Payer: No Typology Code available for payment source | Admitting: Emergency Medicine

## 2013-03-09 VITALS — BP 117/76 | HR 75 | Temp 97.8°F | Resp 16 | Ht 71.5 in | Wt 205.0 lb

## 2013-03-09 DIAGNOSIS — M545 Low back pain, unspecified: Secondary | ICD-10-CM

## 2013-03-09 DIAGNOSIS — M549 Dorsalgia, unspecified: Secondary | ICD-10-CM

## 2013-03-09 MED ORDER — GABAPENTIN 300 MG PO CAPS: ORAL_CAPSULE | ORAL | Status: DC

## 2013-03-09 MED ORDER — OXYCODONE HCL 5 MG PO TABS: 5.0000 mg | ORAL_TABLET | ORAL | Status: DC | PRN

## 2013-03-09 NOTE — Progress Notes (Signed)
  Subjective:    Patient ID: Timothy Dickson, male    DOB: Nov 01, 1945, 67 y.o.   MRN: [id number]  HPI patient seen recently with low back pain sciatica down his left leg. He did respond well to steroids. He enters today with persistent severe pain in his low back. He is having radicular symptoms down the back of his left leg.    Review of Systems     Objective:   Physical Exam there is tenderness over the L5-S1 area. Deep tendon reflexes are 2+ and symmetrical motor strength is 5 out of 5 all muscle groups. Straight leg raising is positive on the left at about 75        Assessment & Plan:  Previous LS-spine films show multilevel degenerative disc disease. He does have symptoms of a radiculopathy down the left leg. We'll make a referral for an MRI of the back and subsequent referral to Dr. Barnett Abu for evaluation. He will be treated with pain medication and Neurontin

## 2013-03-14 ENCOUNTER — Telehealth: Payer: Self-pay

## 2013-03-14 NOTE — Telephone Encounter (Signed)
Pt would like a copy of the x-rays that he had on his lower back he has an appt with an orthopedic doctor Saturday and will need them by tomorrow.  Best#[phone]  (work)

## 2013-03-15 NOTE — Telephone Encounter (Signed)
Request given to xray 

## 2013-04-09 ENCOUNTER — Encounter: Payer: Self-pay | Admitting: Internal Medicine

## 2013-04-09 ENCOUNTER — Ambulatory Visit (INDEPENDENT_AMBULATORY_CARE_PROVIDER_SITE_OTHER): Payer: No Typology Code available for payment source | Admitting: Internal Medicine

## 2013-04-09 VITALS — BP 112/80 | HR 82 | Temp 98.1°F | Wt 207.4 lb

## 2013-04-09 DIAGNOSIS — B1710 Acute hepatitis C without hepatic coma: Secondary | ICD-10-CM

## 2013-04-09 DIAGNOSIS — I499 Cardiac arrhythmia, unspecified: Secondary | ICD-10-CM

## 2013-04-09 DIAGNOSIS — I10 Essential (primary) hypertension: Secondary | ICD-10-CM

## 2013-04-09 NOTE — Patient Instructions (Addendum)
1. Cardiac arrythmia - by history this sounds like paroxysmal supraventricular tachycardia - a non-fatal arrythmia but one that can cause syncope-feinting. The holter monitor in Dec '08 did not capture the core arrythmia. The fact that you respond to a valsalva maneuver helps with the diagnosis.  Plan To be as sure as possible of the diagnosis and to best choose treatment (observation vs medication vs ablation) will have you see an electrophysiologist: Drs [name], Graciela Husbands and Allred at [facility]  No new meds at this time  For an episode of rapid heart rate, greater than 180, or an episode with symptoms that doesn't respond to valsalva go to [facility] ED  2. Liver - good luck with this and the new "miracle" drugs.  All else seems stable.

## 2013-04-09 NOTE — Progress Notes (Signed)
  Subjective:    Patient ID: Timothy Dickson, male    DOB: 1946/02/28, 67 y.o.   MRN: [id number]  HPI [name] had a flare of back pain and did see Dr. Cleta Alberts and then Dr. Ethelene Hal. Sounds like sciatica - MRI is done and results pending.   He has been having increased problems with rapid heart rate. He has a h/o what sounds like PSVT. He does get a response to valsalva manuver. Holter Dec '08-Jan '09 - PVC's, atrial tach, sinus tach. Now having episodes every several months with near syncope/.  Hep C - has had diagnosis of cirrhosis of the liver done at [facility]. He is a candidate for the new drug therapy as soon as it becomes available, hopefully this fall.   PMH, FamHx and SocHx reviewed for any changes and relevance.  Current Outpatient Prescriptions on File Prior to Visit  Medication Sig Dispense Refill  . aspirin EC 81 MG tablet Take 81 mg by mouth daily.       . furosemide (LASIX) 20 MG tablet TAKE 1 TABLET ONCE DAILY.  30 tablet  5  . ibuprofen (ADVIL,MOTRIN) 800 MG tablet Take 800 mg by mouth every 8 (eight) hours as needed for pain.      [name] KRILL OIL PO Take 1 tablet by mouth daily.        . naproxen sodium (ANAPROX) 220 MG tablet Take 220 mg by mouth 2 (two) times daily as needed.      [name] omeprazole (PRILOSEC) 20 MG capsule Take 20 mg by mouth daily.        No current facility-administered medications on file prior to visit.   ]    Review of Systems System review is negative for any constitutional, cardiac, pulmonary, GI or neuro symptoms or complaints other than as described in the HPI.     Objective:   Physical Exam Filed Vitals:   04/09/13 1412  BP: 112/80  Pulse: 82  Temp: 98.1 F (36.7 C)   Wt Readings from Last 3 Encounters:  04/09/13 207 lb 6.4 oz (94.076 kg)  03/09/13 205 lb (92.987 kg)  02/27/13 206 lb (93.441 kg)   Gen'l- WNWD man in no distress HEENT_ C&S w/o icterus Cor- 2+ radial, RRR, no PVCs Pulm - normal respirations. Neuro - A&O x3, cognition normal,  normal gait.        Assessment & Plan:

## 2013-04-11 NOTE — Assessment & Plan Note (Signed)
Cardiac arrythmia - by history this sounds like paroxysmal supraventricular tachycardia - a non-fatal arrythmia but one that can cause syncope-feinting. The holter monitor in Dec '08 did not capture the core arrythmia. The fact that you respond to a valsalva maneuver helps with the diagnosis.  Plan To be as sure as possible of the diagnosis and to best choose treatment (observation vs medication vs ablation) will have you see an electrophysiologist: Drs [name], Graciela Husbands and Allred at [facility]  No new meds at this time  For an episode of rapid heart rate, greater than 180, or an episode with symptoms that doesn't respond to valsalva go to [facility]

## 2013-04-11 NOTE — Assessment & Plan Note (Signed)
Patient with cirrhosis, diagnosed at [facility] with positive Fibroscan and biopsy. Is awaiting medical therapy for Hep C due out soon.

## 2013-04-11 NOTE — Assessment & Plan Note (Signed)
BP Readings from Last 3 Encounters:  04/09/13 112/80  03/09/13 117/76  02/27/13 118/78   Great control of BP  Plan Continue present medication

## 2013-04-16 ENCOUNTER — Encounter: Payer: Self-pay | Admitting: *Deleted

## 2013-04-18 ENCOUNTER — Ambulatory Visit (INDEPENDENT_AMBULATORY_CARE_PROVIDER_SITE_OTHER): Payer: No Typology Code available for payment source | Admitting: Cardiology

## 2013-04-18 ENCOUNTER — Encounter: Payer: Self-pay | Admitting: Cardiology

## 2013-04-18 ENCOUNTER — Encounter: Payer: Self-pay | Admitting: *Deleted

## 2013-04-18 ENCOUNTER — Encounter (INDEPENDENT_AMBULATORY_CARE_PROVIDER_SITE_OTHER): Payer: No Typology Code available for payment source

## 2013-04-18 VITALS — BP 144/90 | HR 74 | Ht 71.5 in | Wt 209.0 lb

## 2013-04-18 DIAGNOSIS — I471 Supraventricular tachycardia: Secondary | ICD-10-CM

## 2013-04-18 DIAGNOSIS — R42 Dizziness and giddiness: Secondary | ICD-10-CM

## 2013-04-18 DIAGNOSIS — I498 Other specified cardiac arrhythmias: Secondary | ICD-10-CM

## 2013-04-18 DIAGNOSIS — I10 Essential (primary) hypertension: Secondary | ICD-10-CM

## 2013-04-18 MED ORDER — CARVEDILOL 6.25 MG PO TABS: 6.2500 mg | ORAL_TABLET | Freq: Two times a day (BID) | ORAL | Status: DC

## 2013-04-18 NOTE — Progress Notes (Signed)
Patient ID: Timothy Dickson, male   DOB: 11/02/1945, 67 y.o.   MRN: [id number] E-Cardio verite 30 day cardiac event monitor applied to patient.

## 2013-04-18 NOTE — Patient Instructions (Addendum)
Your physician recommends that you schedule a follow-up appointment in: 1 MONTHS WITH DR. Delton See   Your physician has recommended you make the following change in your medication:   STOP LASIX START COREG 6.25 MG TWICE A DAY (TWELVE HOURS APART)   Your physician has recommended that you wear an event monitor TODAY AT 4:30PM. Event monitors are medical devices that record the heart's electrical activity. Doctors most often [place] these monitors to diagnose arrhythmias. Arrhythmias are problems with the speed or rhythm of the heartbeat. The monitor is a small, portable device. You can wear one while you do your normal daily activities. This is usually used to diagnose what is causing palpitations/syncope (passing out).  Your physician recommends that you continue on your current medications as directed. Please refer to the Current Medication list given to you today.

## 2013-04-18 NOTE — Progress Notes (Signed)
Patient ID: DANIS PEMBLETON, male   DOB: 07/07/1946, 67 y.o.   MRN: [id number]    Patient Name: Timothy Dickson Date of Encounter: 04/18/2013  Primary Care Provider:  Illene Regulus, MD Primary Cardiologist:  Tobias Alexander, H  Patient Profile Paroxysmal tachycardia  Problem List   Past Medical History  Diagnosis Date  . Osteoarthritis, knee   . Diverticulosis of colon     mild  . History of substance abuse   . Family history of renal cell carcinoma   . Hyperglycemia   . History of BPH   . Hypertension   . Cardiac dysrhythmia, unspecified     rapid  . Pneumonia 1975    hx of 35 years ago  . GERD (gastroesophageal reflux disease)   . Headache(784.0)   . Cancer of kidney     right kidney  . Acute hepatitis C without mention of hepatic coma    Past Surgical History  Procedure Laterality Date  . Arthroscopy, left knee,    . Left shoulder repair for bone spur  1990's  . History of liver biopsy  2007  . Transurethral needle ablation procedure    . Tuna procedure    . History of nephrectomy Right 1999  . Arthroscopy right knee      may '11 (Dr Charlann Boxer)  . Knee arthroscopy Left 10/10/2012    Procedure: LEFT KNEE ARTHROSCOPY WITH MENSCIAL DEBRIDEMENT AND CHONDROPLASTY;  Surgeon: Loanne Drilling, MD;  Location: [facility];  Service: Orthopedics;  Laterality: Left;    Allergies  No Known Allergies  HPI  A very pleasant patient with h/o substance abuseand active hepatitis C, with paroxysmal tachycardia that occurs approximately twice a year. When checkig his pulse during the episode it was 190/minute. He feels slightly dizzy during the episodes. His PCP advised him to use Valsalva maneuvers that help to terminate the episodes. He feels that lately they have been increasing in frequency. He is scheduled for a Hepatitis C treatment with Sofosburir/Ledipasvir in October. He also admits to have hypertension when checking his BO in the last couple of months. He was given Lasix PO for it  that only worked temporarily. He is avid biker and denies any symptoms of chest pain or SOB.   Home Medications  Prior to Admission medications   Medication Sig Start Date End Date Taking? Authorizing Provider  furosemide (LASIX) 20 MG tablet TAKE 1 TABLET ONCE DAILY. 02/13/13  Yes Jacques Navy, MD  ibuprofen (ADVIL,MOTRIN) 800 MG tablet Take 800 mg by mouth every 8 (eight) hours as needed for pain.   Yes Historical Provider, MD  KRILL OIL PO Take 1 tablet by mouth daily.     Yes Historical Provider, MD  Multiple Vitamin (MULTIVITAMIN) capsule Take 1 capsule by mouth daily.   Yes Historical Provider, MD  naproxen sodium (ANAPROX) 220 MG tablet Take 220 mg by mouth 2 (two) times daily as needed.   Yes Historical Provider, MD  omeprazole (PRILOSEC) 20 MG capsule Take 20 mg by mouth daily.    Yes Historical Provider, MD    Family History  Family History  Problem Relation Age of Onset  . Coronary artery disease Mother   . Prostate cancer Brother   . Alcohol abuse Other     Strong family Hx  . Diabetes Sister   . Breast cancer Sister   . Lung cancer Sister   . Esophageal cancer Sister   . CVA Mother   . Alcohol abuse Father   .  Alcohol abuse Sister   . Cancer Brother     head and neck    Social History  History   Social History  . Marital Status: Married    Spouse Name: N/A    Number of Children: N/A  . Years of Education: N/A   Occupational History  . Not on file.   Social History Main Topics  . Smoking status: Former Smoker -- 1.00 packs/day for 15 years    Types: Cigarettes    Quit date: 08/01/1977  . Smokeless tobacco: Never Used  . Alcohol Use: No  . Drug Use: No  . Sexual Activity: Yes    Partners: Female   Other Topics Concern  . Not on file   Social History Narrative   [name] BA, [place] [employer], [place] [employer]. Married- '84. 2 step-daughters, 4 grandchildren. 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 Marriage in  good health.     Review of Systems General:  No chills, fever, night sweats or weight changes.  Cardiovascular:  No chest pain, dyspnea on exertion, edema, orthopnea, + palpitations, paroxysmal nocturnal dyspnea. Dermatological: No rash, lesions/masses Respiratory: No cough, dyspnea Urologic: No hematuria, dysuria Abdominal:   No nausea, vomiting, diarrhea, bright red blood per rectum, melena, or hematemesis Neurologic:  No visual changes, wkns, changes in mental status. All other systems reviewed and are otherwise negative except as noted above.  Physical Exam  Blood pressure 144/90, pulse 74, height 5' 11.5" (1.816 m), weight 209 lb (94.802 kg).  General: Pleasant, NAD Psych: Normal affect. Neuro: Alert and oriented X 3. Moves all extremities spontaneously. HEENT: Normal  Neck: Supple without bruits or JVD. Lungs:  Resp regular and unlabored, CTA. Heart: RRR no s3, s4, or murmurs. Abdomen: Soft, non-tender, non-distended, BS + x 4.  Extremities: No clubbing, cyanosis or edema. DP/PT/Radials 2+ and equal bilaterally.  Accessory Clinical Findings  ECG - SR, normal ECG   TTE: 05/07/2012 Left ventricle: The cavity size was normal. Wall thickness was increased in a pattern of mild LVH. Systolic function was normal. The estimated ejection fraction was in the range of 60% to 65%. Wall motion was normal; there were no regional wall motion abnormalities. - Aorta: Root size is normal. But ascending aorta is mildly dilated at 43mm. - Mitral valve: Mild regurgitation. - Left atrium: The atrium was mildly dilated.    Assessment & Plan  67 years old male   1. 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 We will start Event monitor for a month today. We will start the patient on Carvedilol 6.25 mg PO BID. I consulted our pharmacologist about possible interactions with scheduled therapy for  Hepatitis C and there are none. The other consideration was Cardizem that is metabolized in the liver so BB appears as a better option.  2. Hypertension - we will start Carvedilol, follow up in 1 month  3. Lipid profile - recently checked by PCP and WNL. However, even if elevated we would be hesitant to start statins in the settings of liver impairment.  Follow up in 1 month.   Tobias Alexander, Rexene Edison, MD 04/18/2013, 8:40 AM

## 2013-05-14 ENCOUNTER — Encounter: Payer: Self-pay | Admitting: Cardiology

## 2013-05-22 ENCOUNTER — Encounter: Payer: Self-pay | Admitting: Cardiology

## 2013-05-22 ENCOUNTER — Ambulatory Visit (INDEPENDENT_AMBULATORY_CARE_PROVIDER_SITE_OTHER): Payer: No Typology Code available for payment source | Admitting: Internal Medicine

## 2013-05-22 VITALS — BP 130/67 | HR 83 | Ht 72.0 in | Wt 206.0 lb

## 2013-05-22 DIAGNOSIS — I10 Essential (primary) hypertension: Secondary | ICD-10-CM

## 2013-05-22 DIAGNOSIS — I471 Supraventricular tachycardia: Secondary | ICD-10-CM

## 2013-05-22 NOTE — Progress Notes (Signed)
Primary Care Physician: Illene Regulus, MD Referring Physician:  Dr Andree Coss Timothy Dickson is a 67 y.o. male with a h/o tachypalpitations who presents for EP evaluation.  He reports having abrupt onset and termination of tachypalpitations over the past 5-8 years.  He is unaware of triggers or precipitants but has found that vagal maneuvers would terminate episodes.  He has not been able to capture his arrhythmia on ekg due to spontaneous termination.  (He has been to the ER but had termination prior to arrival).  During episodes he reports tachypalpitations and fatigue.  He has recently had increasing frequency and duration of episodes.   He was seen last month by Dr Delton See. At that time, he had an event monitor placed. He had no episodes while wearing the monitor.  He did have PVCs but no other arrhythmias.  He has been placed on coreg and is tolerating this medicine well.   Today, he denies symptoms of chest pain, shortness of breath, orthopnea, PND, lower extremity edema, dizziness, presyncope, syncope, or neurologic sequela. The patient is tolerating medications without difficulties and is otherwise without complaint today.   Past Medical History  Diagnosis Date  . Osteoarthritis, knee   . Diverticulosis of colon     mild  . History of substance abuse   . Family history of renal cell carcinoma   . Hyperglycemia   . History of BPH   . Hypertension   . Tachycardia     not well documented, no episodes while wearing a 30 day monitor 9/14  . Pneumonia 1975    hx of 35 years ago  . GERD (gastroesophageal reflux disease)   . Headache(784.0)   . Cancer of kidney     right kidney  . 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 PVC's (premature ventricular contractions)    Past Surgical History  Procedure Laterality Date  . Arthroscopy, left knee,    . Left shoulder repair for bone spur  1990's  . History of liver biopsy  2007  . Transurethral needle ablation procedure    .  Tuna procedure    . History of nephrectomy Right 1999  . Arthroscopy right knee      may '11 (Dr Charlann Boxer)  . Knee arthroscopy Left 10/10/2012    Procedure: LEFT KNEE ARTHROSCOPY WITH MENSCIAL DEBRIDEMENT AND CHONDROPLASTY;  Surgeon: Loanne Drilling, MD;  Location: [facility];  Service: Orthopedics;  Laterality: Left;    Current Outpatient Prescriptions  Medication Sig Dispense Refill  . carvedilol (COREG) 6.25 MG tablet Take 1 tablet (6.25 mg total) by mouth 2 (two) times daily.  180 tablet  3  . ibuprofen (ADVIL,MOTRIN) 800 MG tablet Take 800 mg by mouth every 8 (eight) hours as needed for pain.      [name] KRILL OIL PO Take 1 tablet by mouth daily.        . Multiple Vitamin (MULTIVITAMIN) capsule Take 1 capsule by mouth daily.      . naproxen sodium (ANAPROX) 220 MG tablet Take 220 mg by mouth 2 (two) times daily as needed.      [name] omeprazole (PRILOSEC) 20 MG capsule Take 20 mg by mouth daily.        No current facility-administered medications for this visit.    No Known Allergies  History   Social History  . Marital Status: Married    Spouse Name: N/A    Number of Children: N/A  . Years of Education:  N/A   Occupational History  . Not on file.   Social History Main Topics  . Smoking status: Former Smoker -- 1.00 packs/day for 15 years    Types: Cigarettes    Quit date: 08/01/1977  . Smokeless tobacco: Never Used  . Alcohol Use: No  . Drug Use: No  . Sexual Activity: Yes    Partners: Female   Other Topics Concern  . Not on file   Social History Narrative   [name] BA, [place] [employer], [place] [employer]. Married- '84. 2 step-daughters, 4 grandchildren. 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 Marriage in good health.   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 Coronary artery disease Mother   . Prostate cancer Brother   . Alcohol abuse Other     Strong family Hx  . Diabetes Sister   .  Breast cancer Sister   . Lung cancer Sister   . Esophageal cancer Sister   . CVA Mother   . Alcohol abuse Father   . Alcohol abuse Sister   . Cancer Brother     head and neck    ROS- All systems are reviewed and negative except as per the HPI above  Physical Exam: Filed Vitals:   05/22/13 0904  BP: 130/67  Pulse: 83  Height: 6' (1.829 m)  Weight: 206 lb (93.441 kg)    GEN- The patient is well appearing, alert and oriented x 3 today.   Head- normocephalic, atraumatic Eyes-  Sclera clear, conjunctiva pink Ears- hearing intact Oropharynx- clear Neck- supple, no JVP Lymph- no cervical lymphadenopathy Lungs- Clear to ausculation bilaterally, normal work of breathing Heart- Regular rate and rhythm, no murmurs, rubs or gallops, PMI not laterally displaced GI- soft, NT, ND, + BS Extremities- no clubbing, cyanosis, or edema MS- no significant deformity or atrophy Skin- no rash or lesion Psych- euthymic mood, full affect Neuro- strength and sensation are intact  EKGs are reviewed Recent event monitor is reviewed  Assessment and Plan:  1. Tachycardia The patient has abrupt onset/ offset of tachypalpitations.  History is suggestive of SVT, though we have not been able to document this.  Recent event monitor was unrevealing.  Today I offered EP study and also an implantable loop recorder as options to better characterize and treat his arrhythmia.  Presently, he is very comfortable with his current strategy of coreg.  He does not wish to pursue any further EP workup at this time.  He will follow with Dr Debby Bud and I am happy to seen in the future should his arrhythmia return.  2. HTN Continue current medicines  Return as needed.

## 2013-05-22 NOTE — Patient Instructions (Signed)
Your physician recommends that you schedule a follow-up appointment as needed.   Your physician recommends that you continue on your current medications as directed. Please refer to the Current Medication list given to you today.  

## 2013-10-16 LAB — HM DIABETES EYE EXAM

## 2013-10-28 ENCOUNTER — Ambulatory Visit (INDEPENDENT_AMBULATORY_CARE_PROVIDER_SITE_OTHER): Payer: No Typology Code available for payment source | Admitting: Internal Medicine

## 2013-10-28 ENCOUNTER — Other Ambulatory Visit (INDEPENDENT_AMBULATORY_CARE_PROVIDER_SITE_OTHER): Payer: No Typology Code available for payment source

## 2013-10-28 ENCOUNTER — Encounter: Payer: Self-pay | Admitting: Internal Medicine

## 2013-10-28 VITALS — BP 136/84 | HR 68 | Temp 98.7°F | Resp 16 | Ht 72.0 in | Wt 215.5 lb

## 2013-10-28 DIAGNOSIS — Z0000 Encounter for general adult medical examination without abnormal findings: Secondary | ICD-10-CM

## 2013-10-28 DIAGNOSIS — E782 Mixed hyperlipidemia: Secondary | ICD-10-CM | POA: Insufficient documentation

## 2013-10-28 DIAGNOSIS — IMO0001 Reserved for inherently not codable concepts without codable children: Secondary | ICD-10-CM

## 2013-10-28 DIAGNOSIS — B1710 Acute hepatitis C without hepatic coma: Secondary | ICD-10-CM

## 2013-10-28 DIAGNOSIS — I10 Essential (primary) hypertension: Secondary | ICD-10-CM

## 2013-10-28 DIAGNOSIS — E785 Hyperlipidemia, unspecified: Secondary | ICD-10-CM

## 2013-10-28 DIAGNOSIS — E1169 Type 2 diabetes mellitus with other specified complication: Secondary | ICD-10-CM | POA: Insufficient documentation

## 2013-10-28 DIAGNOSIS — E1165 Type 2 diabetes mellitus with hyperglycemia: Principal | ICD-10-CM

## 2013-10-28 LAB — URINALYSIS, ROUTINE W REFLEX MICROSCOPIC
Bilirubin Urine: NEGATIVE
Hgb urine dipstick: NEGATIVE
Ketones, ur: NEGATIVE
Leukocytes, UA: NEGATIVE
Nitrite: NEGATIVE
RBC / HPF: NONE SEEN (ref 0–?)
Specific Gravity, Urine: 1.01 (ref 1.000–1.030)
Total Protein, Urine: NEGATIVE
Urine Glucose: NEGATIVE
Urobilinogen, UA: 0.2 (ref 0.0–1.0)
WBC, UA: NONE SEEN (ref 0–?)
pH: 6 (ref 5.0–8.0)

## 2013-10-28 LAB — MICROALBUMIN / CREATININE URINE RATIO
Creatinine,U: 45.1 mg/dL
Microalb Creat Ratio: 2 mg/g (ref 0.0–30.0)
Microalb, Ur: 0.9 mg/dL (ref 0.0–1.9)

## 2013-10-28 LAB — BASIC METABOLIC PANEL
BUN: 22 mg/dL (ref 6–23)
CO2: 27 mEq/L (ref 19–32)
Calcium: 9.6 mg/dL (ref 8.4–10.5)
Chloride: 99 mEq/L (ref 96–112)
Creatinine, Ser: 1.2 mg/dL (ref 0.4–1.5)
GFR: 67.2 mL/min (ref >60.00)
Glucose, Bld: 172 mg/dL — ABNORMAL HIGH (ref 70–99)
Potassium: 4.1 mEq/L (ref 3.5–5.1)
Sodium: 135 mEq/L (ref 135–145)

## 2013-10-28 LAB — LIPID PANEL
Cholesterol: 198 mg/dL (ref 0–200)
HDL: 39.8 mg/dL (ref >39.00)
LDL Cholesterol: 131 mg/dL — ABNORMAL HIGH (ref 0–99)
Total CHOL/HDL Ratio: 5
Triglycerides: 138 mg/dL (ref 0.0–149.0)
VLDL: 27.6 mg/dL (ref 0.0–40.0)

## 2013-10-28 LAB — FECAL OCCULT BLOOD, GUAIAC: Fecal Occult Blood: NEGATIVE

## 2013-10-28 LAB — TSH: TSH: 1.34 u[IU]/mL (ref 0.35–5.50)

## 2013-10-28 LAB — HEMOGLOBIN A1C: Hgb A1c MFr Bld: 7 % — ABNORMAL HIGH (ref 4.6–6.5)

## 2013-10-28 LAB — PSA: PSA: 0.6

## 2013-10-28 LAB — HM DIABETES FOOT EXAM

## 2013-10-28 NOTE — Progress Notes (Signed)
Pre visit review using our clinic review tool, if applicable. No additional management support is needed unless otherwise documented below in the visit note. 

## 2013-10-28 NOTE — Assessment & Plan Note (Signed)
His BP is well controlled 

## 2013-10-28 NOTE — Assessment & Plan Note (Signed)
I will check his A1C and will advise further

## 2013-10-28 NOTE — Assessment & Plan Note (Signed)
Will cont to follow at [facility]

## 2013-10-28 NOTE — Patient Instructions (Signed)
Type 2 Diabetes Mellitus, Adult Type 2 diabetes mellitus, often simply referred to as type 2 diabetes, is a long-lasting (chronic) disease. In type 2 diabetes, the pancreas does not make enough insulin (a hormone), the cells are less responsive to the insulin that is made (insulin resistance), or both. Normally, insulin moves sugars from food into the tissue cells. The tissue cells use the sugars for energy. The lack of insulin or the lack of normal response to insulin causes excess sugars to build up in the blood instead of going into the tissue cells. As a result, high blood sugar (hyperglycemia) develops. The effect of high sugar (glucose) levels can cause many complications. Type 2 diabetes was also previously called adult-onset diabetes but it can occur at any age.  RISK FACTORS  A person is predisposed to developing type 2 diabetes if someone in the family has the disease and also has one or more of the following primary risk factors:  Overweight.  An inactive lifestyle.  A history of consistently eating high-calorie foods. Maintaining a normal weight and regular physical activity can reduce the chance of developing type 2 diabetes. SYMPTOMS  A person with type 2 diabetes may not show symptoms initially. The symptoms of type 2 diabetes appear slowly. The symptoms include:  Increased thirst (polydipsia).  Increased urination (polyuria).  Increased urination during the night (nocturia).  Weight loss. This weight loss may be rapid.  Frequent, recurring infections.  Tiredness (fatigue).  Weakness.  Vision changes, such as blurred vision.  Fruity smell to your breath.  Abdominal pain.  Nausea or vomiting.  Cuts or bruises which are slow to heal.  Tingling or numbness in the hands or feet. DIAGNOSIS Type 2 diabetes is frequently not diagnosed until complications of diabetes are present. Type 2 diabetes is diagnosed when symptoms or complications are present and when blood  glucose levels are increased. Your blood glucose level may be checked by one or more of the following blood tests:  A fasting blood glucose test. You will not be allowed to eat for at least 8 hours before a blood sample is taken.  A random blood glucose test. Your blood glucose is checked at any time of the day regardless of when you ate.  A hemoglobin A1c blood glucose test. A hemoglobin A1c test provides information about blood glucose control over the previous 3 months.  An oral glucose tolerance test (OGTT). Your blood glucose is measured after you have not eaten (fasted) for 2 hours and then after you drink a glucose-containing beverage. TREATMENT   You may need to take insulin or diabetes medicine daily to keep blood glucose levels in the desired range.  You will need to match insulin dosing with exercise and healthy food choices. The treatment goal is to maintain the before meal blood sugar (preprandial glucose) level at 70 130 mg/dL. HOME CARE INSTRUCTIONS   Have your hemoglobin A1c level checked twice a year.  Perform daily blood glucose monitoring as directed by your caregiver.  Monitor urine ketones when you are ill and as directed by your caregiver.  Take your diabetes medicine or insulin as directed by your caregiver to maintain your blood glucose levels in the desired range.  Never run out of diabetes medicine or insulin. It is needed every day.  Adjust insulin based on your intake of carbohydrates. Carbohydrates can raise blood glucose levels but need to be included in your diet. Carbohydrates provide vitamins, minerals, and fiber which are an essential part of   a healthy diet. Carbohydrates are found in fruits, vegetables, whole grains, dairy products, legumes, and foods containing added sugars.    Eat healthy foods. Alternate 3 meals with 3 snacks.  Lose weight if overweight.  Carry a medical alert card or wear your medical alert jewelry.  Carry a 15 gram  carbohydrate snack with you at all times to treat low blood glucose (hypoglycemia). Some examples of 15 gram carbohydrate snacks include:  Glucose tablets, 3 or 4   Glucose gel, 15 gram tube  Raisins, 2 tablespoons (24 grams)  Jelly beans, 6  Animal crackers, 8  Regular pop, 4 ounces (120 mL)  Gummy treats, 9  Recognize hypoglycemia. Hypoglycemia occurs with blood glucose levels of 70 mg/dL and below. The risk for hypoglycemia increases when fasting or skipping meals, during or after intense exercise, and during sleep. Hypoglycemia symptoms can include:  Tremors or shakes.  Decreased ability to concentrate.  Sweating.  Increased heart rate.  Headache.  Dry mouth.  Hunger.  Irritability.  Anxiety.  Restless sleep.  Altered speech or coordination.  Confusion.  Treat hypoglycemia promptly. If you are alert and able to safely swallow, follow the 15:15 rule:  Take 15 20 grams of rapid-acting glucose or carbohydrate. Rapid-acting options include glucose gel, glucose tablets, or 4 ounces (120 mL) of fruit juice, regular soda, or low fat milk.  Check your blood glucose level 15 minutes after taking the glucose.  Take 15 20 grams more of glucose if the repeat blood glucose level is still 70 mg/dL or below.  Eat a meal or snack within 1 hour once blood glucose levels return to normal.    Be alert to polyuria and polydipsia which are early signs of hyperglycemia. An early awareness of hyperglycemia allows for prompt treatment. Treat hyperglycemia as directed by your caregiver.  Engage in at least 150 minutes of moderate-intensity physical activity a week, spread over at least 3 days of the week or as directed by your caregiver. In addition, you should engage in resistance exercise at least 2 times a week or as directed by your caregiver.  Adjust your medicine and food intake as needed if you start a new exercise or sport.  Follow your sick day plan at any time you  are unable to eat or drink as usual.  Avoid tobacco use.  Limit alcohol intake to no more than 1 drink per day for nonpregnant women and 2 drinks per day for men. You should drink alcohol only when you are also eating food. Talk with your caregiver whether alcohol is safe for you. Tell your caregiver if you drink alcohol several times a week.  Follow up with your caregiver regularly.  Schedule an eye exam soon after the diagnosis of type 2 diabetes and then annually.  Perform daily skin and foot care. Examine your skin and feet daily for cuts, bruises, redness, nail problems, bleeding, blisters, or sores. A foot exam by a caregiver should be done annually.  Brush your teeth and gums at least twice a day and floss at least once a day. Follow up with your dentist regularly.  Share your diabetes management plan with your workplace or school.  Stay up-to-date with immunizations.  Learn to manage stress.  Obtain ongoing diabetes education and support as needed.  Participate in, or seek rehabilitation as needed to maintain or improve independence and quality of life. Request a physical or occupational therapy referral if you are having foot or hand numbness or difficulties with grooming,   dressing, eating, or physical activity. SEEK MEDICAL CARE IF:   You are unable to eat food or drink fluids for more than 6 hours.  You have nausea and vomiting for more than 6 hours.  Your blood glucose level is over 240 mg/dL.  There is a change in mental status.  You develop an additional serious illness.  You have diarrhea for more than 6 hours.  You have been sick or have had a fever for a couple of days and are not getting better.  You have pain during any physical activity.  SEEK IMMEDIATE MEDICAL CARE IF:  You have difficulty breathing.  You have moderate to large ketone levels. MAKE SURE YOU:  Understand these instructions.  Will watch your condition.  Will get help right away if  you are not doing well or get worse. Document Released: 07/18/2005 Document Revised: 04/11/2012 Document Reviewed: 02/14/2012 [facility] Patient Information 2014 [place]. Health Maintenance, Males A healthy lifestyle and preventative care can promote health and wellness.  Maintain regular health, dental, and eye exams.  Eat a healthy diet. Foods like vegetables, fruits, whole grains, low-fat dairy products, and lean protein foods contain the nutrients you need and are low in calories. Decrease your intake of foods high in solid fats, added sugars, and salt. Get information about a proper diet from your health care provider, if necessary.  Regular physical exercise is one of the most important things you can do for your health. Most adults should get at least 150 minutes of moderate-intensity exercise (any activity that increases your heart rate and causes you to sweat) each week. In addition, most adults need muscle-strengthening exercises on 2 or more days a week.   Maintain a healthy weight. The body mass index (BMI) is a screening tool to identify possible weight problems. It provides an estimate of body fat based on height and weight. Your health care provider can find your BMI and can help you achieve or maintain a healthy weight. For males 20 years and older:  A BMI below 18.5 is considered underweight.  A BMI of 18.5 to 24.9 is normal.  A BMI of 25 to 29.9 is considered overweight.  A BMI of 30 and above is considered obese.  Maintain normal blood lipids and cholesterol by exercising and minimizing your intake of saturated fat. Eat a balanced diet with plenty of fruits and vegetables. Blood tests for lipids and cholesterol should begin at age 16 and be repeated every 5 years. If your lipid or cholesterol levels are high, you are over 50, or you are at high risk for heart disease, you may need your cholesterol levels checked more frequently.Ongoing high lipid and cholesterol  levels should be treated with medicines, if diet and exercise are not working.  If you smoke, find out from your health care provider how to quit. If you do not use tobacco, do not start.  Lung cancer screening is recommended for adults aged 9 80 years who are at high risk for developing lung cancer because of a history of smoking. A yearly low-dose CT scan of the lungs is recommended for people who have at least a 30-pack-year history of smoking and are a current smoker or have quit within the past 15 years. A pack year of smoking is smoking an average of 1 pack of cigarettes a day for 1 year (for example, a 30-pack-year history of smoking could mean smoking 1 pack a day for 30 years or 2 packs a day  for 15 years). Yearly screening should continue until the smoker has stopped smoking for at least 15 years. Yearly screening should be stopped for people who develop a health problem that would prevent them from having lung cancer treatment.  If you choose to drink alcohol, do not have more than 2 drinks per day. One drink is considered to be 12 oz (360 mL) of beer, 5 oz (150 mL) of wine, or 1.5 oz (45 mL) of liquor.  Avoid use of street drugs. Do not share needles with anyone. Ask for help if you need support or instructions about stopping the use of drugs.  High blood pressure causes heart disease and increases the risk of stroke. Blood pressure should be checked at least every 1 2 years. Ongoing high blood pressure should be treated with medicines if weight loss and exercise are not effective.  If you are 73 68 years old, ask your health care provider if you should take aspirin to prevent heart disease.  Diabetes screening involves taking a blood sample to check your fasting blood sugar level. This should be done once every 3 years after age 80, if you are at a normal weight and without risk factors for diabetes. Testing should be considered at a younger age or be carried out more frequently if you  are overweight and have at least 1 risk factor for diabetes.  Colorectal cancer can be detected and often prevented. Most routine colorectal cancer screening begins at the age of 44 and continues through age 40. However, your health care provider may recommend screening at an earlier age if you have risk factors for colon cancer. On a yearly basis, your health care provider may provide home test kits to check for hidden blood in the stool. A small camera at the end of a tube may be used to directly examine the colon (sigmoidoscopy or colonoscopy) to detect the earliest forms of colorectal cancer. Talk to your health care provider about this at age 74, when routine screening begins. A direct exam of the colon should be repeated every 5 10 years through age 64, unless early forms of pre-cancerous polyps or small growths are found.  People who are at an increased risk for hepatitis B should be screened for this virus. You are considered at high risk for hepatitis B if:  You were born in a country where hepatitis B occurs often. Talk with your health care provider about which countries are considered high-risk.  Your parents were born in a high-risk country and you have not received a shot to protect against hepatitis B (hepatitis B vaccine).  You have HIV or AIDS.  You use needles to inject street drugs.  You live with, or have sex with, someone who has hepatitis B.  You are a man who has sex with other men (MSM).  You get hemodialysis treatment.  You take certain medicines for conditions like cancer, organ transplantation, and autoimmune conditions.  Hepatitis C blood testing is recommended for all people born from [birth year] through 1965 and any individual with known risk factors for hepatitis C.  Healthy men should no longer receive prostate-specific antigen (PSA) blood tests as part of routine cancer screening. Talk to your health care provider about prostate cancer screening.  Testicular cancer  screening is not recommended for adolescents or adult males who have no symptoms. Screening includes self-exam, a health care provider exam, and other screening tests. Consult with your health care provider about any symptoms you have or  any concerns you have about testicular cancer.  Practice safe sex. Use condoms and avoid high-risk sexual practices to reduce the spread of sexually transmitted infections (STIs).  Use sunscreen. Apply sunscreen liberally and repeatedly throughout the day. You should seek shade when your shadow is shorter than you. Protect yourself by wearing long sleeves, pants, a wide-brimmed hat, and sunglasses year round, whenever you are outdoors.  Tell your health care provider of new moles or changes in moles, especially if there is a change in shape or color. Also tell your provider if a mole is larger than the size of a pencil eraser.  A one-time screening for abdominal aortic aneurysm (AAA) and surgical repair of large AAAs by ultrasound is recommended for men aged 47 75 years who are current or former smokers.  Stay current with your vaccines (immunizations). Document Released: 01/14/2008 Document Revised: 05/08/2013 Document Reviewed: 12/13/2010  Hospital Patient Information 2014 [place], [place].

## 2013-10-28 NOTE — Assessment & Plan Note (Addendum)

## 2013-10-28 NOTE — Progress Notes (Signed)
Subjective:    Patient ID: Timothy Dickson, male    DOB: 09-Aug-1945, 68 y.o.   MRN: [id number]  Hypertension This is a chronic problem. The current episode started more than 1 year ago. The problem has been gradually improving since onset. The problem is controlled. Pertinent negatives include no anxiety, blurred vision, chest pain, headaches, malaise/fatigue, neck pain, orthopnea, palpitations, peripheral edema, PND, shortness of breath or sweats. There are no associated agents to hypertension. Past treatments include beta blockers. The current treatment provides moderate improvement. Compliance problems include diet and exercise.       Review of Systems  Constitutional: Negative.  Negative for fever, chills, malaise/fatigue, diaphoresis, appetite change and fatigue.  HENT: Negative.   Eyes: Negative.  Negative for blurred vision.  Respiratory: Negative.  Negative for cough, choking, chest tightness, shortness of breath and stridor.   Cardiovascular: Negative.  Negative for chest pain, palpitations, orthopnea, leg swelling and PND.  Gastrointestinal: Negative.  Negative for nausea, vomiting, abdominal pain, diarrhea, constipation and blood in stool.  Endocrine: Negative.   Genitourinary: Negative.  Negative for dysuria, urgency, hematuria, flank pain, decreased urine volume, enuresis and difficulty urinating.  Musculoskeletal: Negative.  Negative for neck pain.  Skin: Negative.   Allergic/Immunologic: Negative.   Neurological: Negative.  Negative for dizziness and headaches.  Hematological: Negative.  Negative for adenopathy. Does not bruise/bleed easily.  Psychiatric/Behavioral: Negative.        Objective:   Physical Exam  Vitals reviewed. Constitutional: He is oriented to person, place, and time. He appears well-developed and well-nourished. No distress.  HENT:  Head: Normocephalic and atraumatic.  Mouth/Throat: Oropharynx is clear and moist. No oropharyngeal exudate.  Eyes:  Conjunctivae are normal. Right eye exhibits no discharge. Left eye exhibits no discharge. No scleral icterus.  Neck: Normal range of motion. Neck supple. No JVD present. No tracheal deviation present. No thyromegaly present.  Cardiovascular: Normal rate, regular rhythm, normal heart sounds and intact distal pulses.  Exam reveals no gallop and no friction rub.   No murmur heard. Pulmonary/Chest: Effort normal and breath sounds normal. No stridor. No respiratory distress. He has no wheezes. He has no rales. He exhibits no tenderness.  Abdominal: Soft. Bowel sounds are normal. He exhibits no distension and no mass. There is no tenderness. There is no rebound and no guarding. Hernia confirmed negative in the right inguinal area and confirmed negative in the left inguinal area.  Genitourinary: Rectum normal, testes normal and penis normal. Rectal exam shows no external hemorrhoid, no internal hemorrhoid, no fissure, no mass, no tenderness and anal tone normal. Guaiac negative stool. Prostate is enlarged (2+ smooth symm BPH). Prostate is not tender. Right testis shows no mass, no swelling and no tenderness. Right testis is descended. Left testis shows no mass, no swelling and no tenderness. Left testis is descended. Circumcised. No penile erythema or penile tenderness. No discharge found.  Musculoskeletal: Normal range of motion. He exhibits no edema and no tenderness.  Lymphadenopathy:    He has no cervical adenopathy.       Right: No inguinal adenopathy present.       Left: No inguinal adenopathy present.  Neurological: He is oriented to person, place, and time.  Skin: Skin is warm and dry. No rash noted. He is not diaphoretic. No erythema. No pallor.  Psychiatric: He has a normal mood and affect. His behavior is normal. Judgment and thought content normal.      Lab Results  Component Value Date  WBC 6.7 11/06/2012   HGB 14.7 11/06/2012   HCT 43.3 11/06/2012   PLT 188.0 11/06/2012   GLUCOSE 168*  11/06/2012   CHOL 161 11/07/2011   TRIG 64.0 11/07/2011   HDL 41.00 11/07/2011   LDLCALC 107* 11/07/2011   ALT 98* 11/06/2012   AST 78* 11/06/2012   NA 136 11/06/2012   K 4.0 11/06/2012   CL 99 11/06/2012   CREATININE 1.1 11/06/2012   BUN 16 11/06/2012   CO2 27 11/06/2012   TSH 0.90 03/17/2011   PSA 0.49 11/07/2011   INR 1.0 07/31/2008   HGBA1C 6.7* 11/06/2012      Assessment & Plan:

## 2013-10-29 ENCOUNTER — Encounter: Payer: Self-pay | Admitting: Internal Medicine

## 2013-10-29 LAB — CBC WITH DIFFERENTIAL/PLATELET
Basophils Absolute: 0 10*3/uL (ref 0.0–0.1)
Basophils Relative: 0.5 % (ref 0.0–3.0)
Eosinophils Absolute: 0.3 10*3/uL (ref 0.0–0.7)
Eosinophils Relative: 3.6 % (ref 0.0–5.0)
HCT: 46.6 % (ref 39.0–52.0)
Hemoglobin: 15.5 g/dL (ref 13.0–17.0)
Lymphocytes Relative: 27.1 % (ref 12.0–46.0)
Lymphs Abs: 2.3 10*3/uL (ref 0.7–4.0)
MCHC: 33.4 g/dL (ref 30.0–36.0)
MCV: 94.4 fl (ref 78.0–100.0)
Monocytes Absolute: 0.6 10*3/uL (ref 0.1–1.0)
Monocytes Relative: 7.3 % (ref 3.0–12.0)
Neutro Abs: 5.2 10*3/uL (ref 1.4–7.7)
Neutrophils Relative %: 61.5 % (ref 43.0–77.0)
Platelets: 201 10*3/uL (ref 150.0–400.0)
RBC: 4.94 Mil/uL (ref 4.22–5.81)
RDW: 13.5 % (ref 11.5–14.6)
WBC: 8.4 10*3/uL (ref 4.5–10.5)

## 2014-01-26 ENCOUNTER — Ambulatory Visit (INDEPENDENT_AMBULATORY_CARE_PROVIDER_SITE_OTHER): Payer: Medicare Other | Admitting: Internal Medicine

## 2014-01-26 VITALS — BP 145/85 | HR 62 | Temp 97.3°F | Resp 18 | Ht 71.0 in | Wt 205.0 lb

## 2014-01-26 DIAGNOSIS — H60399 Other infective otitis externa, unspecified ear: Secondary | ICD-10-CM

## 2014-01-26 DIAGNOSIS — H60391 Other infective otitis externa, right ear: Secondary | ICD-10-CM

## 2014-01-26 MED ORDER — NEOMYCIN-POLYMYXIN-HC 3.5-10000-1 OT SUSP: 3.0000 [drp] | Freq: Four times a day (QID) | OTIC | Status: DC

## 2014-01-26 NOTE — Progress Notes (Signed)
Chief Complaint  Patient presents with  . Hearing Loss    left ear x3 days stopped up   He has tried various irrigation methods without success Cleared up for a little while yesterday but is present again this morning Has been itching in the right ear for about 2 months to 2-1/2 months No fever No prior hearing problems  Exam BP 145/85  Pulse 62  Temp(Src) 97.3 F (36.3 C) (Oral)  Resp 18  Ht 5\' 11"  (1.803 m)  Wt 205 lb (92.987 kg)  BMI 28.60 kg/m2  SpO2 98% The right external auditory canal is swollen with redness and pus obscuring the tympanic membrane The left side is clear No regional adenopathy  Impression 1-otitis externa right  Plan Cortisporin otic suspension--10-14 days then recheck and do hearing test

## 2014-01-27 ENCOUNTER — Encounter: Payer: Self-pay | Admitting: Cardiology

## 2014-01-27 ENCOUNTER — Telehealth: Payer: Self-pay

## 2014-01-27 ENCOUNTER — Encounter: Payer: Self-pay | Admitting: *Deleted

## 2014-01-27 ENCOUNTER — Ambulatory Visit (INDEPENDENT_AMBULATORY_CARE_PROVIDER_SITE_OTHER): Payer: Medicare Other | Admitting: Cardiology

## 2014-01-27 ENCOUNTER — Other Ambulatory Visit: Payer: Self-pay | Admitting: *Deleted

## 2014-01-27 VITALS — BP 140/90 | HR 59 | Ht 71.0 in | Wt 208.0 lb

## 2014-01-27 DIAGNOSIS — I10 Essential (primary) hypertension: Secondary | ICD-10-CM

## 2014-01-27 DIAGNOSIS — B182 Chronic viral hepatitis C: Secondary | ICD-10-CM | POA: Insufficient documentation

## 2014-01-27 DIAGNOSIS — I719 Aortic aneurysm of unspecified site, without rupture: Secondary | ICD-10-CM

## 2014-01-27 DIAGNOSIS — Z8619 Personal history of other infectious and parasitic diseases: Secondary | ICD-10-CM | POA: Insufficient documentation

## 2014-01-27 MED ORDER — HYDROCHLOROTHIAZIDE 25 MG PO TABS: 25.0000 mg | ORAL_TABLET | Freq: Every day | ORAL | Status: DC

## 2014-01-27 NOTE — Patient Instructions (Signed)
**Note De-Identified  Obfuscation** Your physician has recommended you make the following change in your medication: Start taking Hydrochlorothiazide 25 mg daily  Your physician has requested that you have an echocardiogram. Echocardiography is a painless test that uses sound waves to create images of your heart. It provides your doctor with information about the size and shape of your heart and how well your heart's chambers and valves are working. This procedure takes approximately one hour. There are no restrictions for this procedure.  Your physician recommends that you schedule a follow-up appointment in: 6 months

## 2014-01-27 NOTE — Telephone Encounter (Signed)
Spoke to pt, he states he would like an oral antibiotic to speed up the healing process of his ear infection.  Please advise.

## 2014-01-27 NOTE — Telephone Encounter (Signed)
An oral antibiotic isn't indicated for his diagnosis.  If he's not improving, or if the drops aren't going down into the canal, he needs re-evaluation.  Otherwise, the drops he was given are the correct treatment.

## 2014-01-27 NOTE — Telephone Encounter (Signed)
Spoke to pt, he is aware. He states he will RTC if no better in 5 days.

## 2014-01-27 NOTE — Progress Notes (Signed)
Patient ID: TALTON DELPRIORE, male   DOB: [birth year], 68 y.o.   MRN: [id number]    Patient Name: Timothy Dickson Date of Encounter: 01/27/2014  Primary Care Provider:  Scarlette Calico, MD Primary Cardiologist:  Ena Dawley H  Patient Profile Paroxysmal tachycardia  Problem List   Past Medical History  Diagnosis Date  . Osteoarthritis, knee   . Diverticulosis of colon     mild  . History of substance abuse   . Family history of renal cell carcinoma   . Hyperglycemia   . History of BPH   . Hypertension   . Tachycardia     not well documented, no episodes while wearing a 30 day monitor 9/14  . Pneumonia 1975    hx of 35 years ago  . GERD (gastroesophageal reflux disease)   . Headache(784.0)   . Cancer of kidney     right kidney  . 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 PVC's (premature ventricular contractions)    Past Surgical History  Procedure Laterality Date  . Arthroscopy, left knee,    . Left shoulder repair for bone spur  1990's  . History of liver biopsy  2007  . Transurethral needle ablation procedure    . Tuna procedure    . History of nephrectomy Right 1999  . Arthroscopy right knee      may '11 (Dr Alvan Dame)  . Knee arthroscopy Left 10/10/2012    Procedure: LEFT KNEE ARTHROSCOPY WITH MENSCIAL DEBRIDEMENT AND CHONDROPLASTY;  Surgeon: Gearlean Alf, MD;  Location: [facility];  Service: Orthopedics;  Laterality: Left;   Allergies  No Known Allergies  HPI  A very pleasant patient with h/o substance abuse and active hepatitis C, with paroxysmal tachycardia that occurs approximately twice a year. When checkig his pulse during the episode it was 190/minute. He feels slightly dizzy during the episodes. His PCP advised him to use Valsalva maneuvers that help to terminate the episodes. He feels that lately they have been increasing in frequency. He is scheduled for a Hepatitis C treatment with Sofosburir/Ledipasvir in October. He also admits to have  hypertension when checking his BP in the last couple of months. He was given Lasix PO for it that only worked temporarily. He is avid biker and denies any symptoms of chest pain or SOB.  The patient is coming back after 8 months, he underwent treatment for hepatitis C without any complications and is awaiting the final results from viral loads. He states that carvedilol helped him resolve he is tachycardia palpitations and he hasn't had any since he was started on carvedilol. On he states that he has been checking his blood pressure and he has been in 150s 160 range on multiple occasions.  Home Medications  Prior to Admission medications   Medication Sig Start Date End Date Taking? Authorizing Provider  furosemide (LASIX) 20 MG tablet TAKE 1 TABLET ONCE DAILY. 02/13/13  Yes Neena Rhymes, MD  ibuprofen (ADVIL,MOTRIN) 800 MG tablet Take 800 mg by mouth every 8 (eight) hours as needed for pain.   Yes Historical Provider, MD  KRILL OIL PO Take 1 tablet by mouth daily.     Yes Historical Provider, MD  Multiple Vitamin (MULTIVITAMIN) capsule Take 1 capsule by mouth daily.   Yes Historical Provider, MD  naproxen sodium (ANAPROX) 220 MG tablet Take 220 mg by mouth 2 (two) times daily as needed.   Yes Historical Provider, MD  omeprazole (PRILOSEC) 20 MG capsule  Take 20 mg by mouth daily.    Yes Historical Provider, MD    Family History  Family History  Problem Relation Age of Onset  . Coronary artery disease Mother   . Prostate cancer Brother   . Alcohol abuse Other     Strong family Hx  . Diabetes Sister   . Breast cancer Sister   . Lung cancer Sister   . Esophageal cancer Sister   . CVA Mother   . Alcohol abuse Father   . Alcohol abuse Sister   . Cancer Brother     head and neck    Social History  History   Social History  . Marital Status: Married    Spouse Name: N/A    Number of Children: N/A  . Years of Education: N/A   Occupational History  . Not on file.   Social  History Main Topics  . Smoking status: Former Smoker -- 1.00 packs/day for 15 years    Types: Cigarettes    Quit date: 08/01/1977  . Smokeless tobacco: Never Used  . Alcohol Use: No  . Drug Use: No  . Sexual Activity: Yes    Partners: Female   Other Topics Concern  . Not on file   Social History Narrative   [place], [place] psych. Married- '84. 2 step-daughters, 4 grandchildren. 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 Marriage in good health.   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  Cardiovascular:  No chest pain, dyspnea on exertion, edema, orthopnea, + palpitations, paroxysmal nocturnal dyspnea. Dermatological: No rash, lesions/masses Respiratory: No cough, dyspnea Urologic: No hematuria, dysuria Abdominal:   No nausea, vomiting, diarrhea, bright red blood per rectum, melena, or hematemesis Neurologic:  No visual changes, wkns, changes in mental status. All other systems reviewed and are otherwise negative except as noted above.  Physical Exam  There were no vitals taken for this visit.  General: Pleasant, NAD Psych: Normal affect. Neuro: Alert and oriented X 3. Moves all extremities spontaneously. HEENT: Normal  Neck: Supple without bruits or JVD. Lungs:  Resp regular and unlabored, CTA. Heart: RRR no s3, s4, or murmurs. Abdomen: Soft, non-tender, non-distended, BS + x 4.  Extremities: No clubbing, cyanosis or edema. DP/PT/Radials 2+ and equal bilaterally.  Accessory Clinical Findings  ECG - SR, normal ECG   TTE: 05/07/2012 Left ventricle: The cavity size was normal. Wall thickness was increased in a pattern of mild LVH. Systolic function was normal. The estimated ejection fraction was in the range of 60% to 65%. Wall motion was normal; there were no regional wall motion abnormalities. - Aorta: Root size is normal. 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 - Mitral valve: Mild regurgitation. - Left atrium: The atrium was mildly dilated.    Assessment & Plan  68 years old male   1. 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 Results after starting Carvedilol 6.25 mg PO BID. I consulted our pharmacologist about possible interactions with scheduled therapy for Hepatitis C and there are none. The other consideration was Cardizem that is metabolized in the liver so BB appears as a better option.  2. Hypertension - we will add hydrochlorothiazide 25 mg daily and have patient male as his blood pressures in  one week.  3. Lipid profile - recently checked by PCP and WNL. However, even if elevated we would be hesitant to start statins in the settings of liver impairment.  Follow up in 6 month.   Dorothy Spark, MD 01/27/2014, 2:36 PM

## 2014-01-27 NOTE — Telephone Encounter (Signed)
Pt treated for infected ear yesterday and given drops for same,would like an Oral antibiotic also  Best phone for pt is [phone]   [facility] [place]

## 2014-02-03 ENCOUNTER — Ambulatory Visit (INDEPENDENT_AMBULATORY_CARE_PROVIDER_SITE_OTHER): Payer: Medicare Other | Admitting: Emergency Medicine

## 2014-02-03 VITALS — BP 126/84 | HR 71 | Temp 98.6°F | Resp 18 | Ht 71.0 in | Wt 205.0 lb

## 2014-02-03 DIAGNOSIS — H60391 Other infective otitis externa, right ear: Secondary | ICD-10-CM

## 2014-02-03 DIAGNOSIS — H60399 Other infective otitis externa, unspecified ear: Secondary | ICD-10-CM

## 2014-02-03 DIAGNOSIS — H109 Unspecified conjunctivitis: Secondary | ICD-10-CM

## 2014-02-03 MED ORDER — OFLOXACIN 0.3 % OP SOLN: 1.0000 [drp] | Freq: Four times a day (QID) | OPHTHALMIC | Status: DC

## 2014-02-03 NOTE — Progress Notes (Signed)
   Subjective:    Patient ID: Timothy Dickson, male    DOB: 1945-08-12, 68 y.o.   MRN: [id number]  HPI Pt reports right eye itching, burning, crusting upon waking in the morning. Vision is ok. Doesn't recall any specific injury to the eye.  He also reports ear pain, right. He has been seen for this but wants to follow up on it.    Review of Systems     Objective:   Physical Exam there is mild redness of the conjunctiva of the right. Pupils are equal and reactive . 2 drops of proparacaine was used. That was everted and swabbed. Fluorescein staining showed no uptake. There is redness of the lower lid with a small 1 mm pustule present laterally. Examination of the right TM reveals a small scar present anteriorly the canal itself is normal. Weber and Debroah Baller testing localizes to the right ear.        Assessment & Plan:  Patient has a conjunctivitis of the right. We'll treat with Ocuflox. He has an otitis which is resolving.

## 2014-02-03 NOTE — Patient Instructions (Signed)

## 2014-02-05 ENCOUNTER — Telehealth: Payer: Self-pay | Admitting: Cardiology

## 2014-02-05 NOTE — Telephone Encounter (Signed)
Called pt to inform him that his BP readings are WNL and have shown much improvement.  Informed pt that I will route this message to Dr Meda Coffee for her review.  Pt verbalized understanding and agrees with this plan.

## 2014-02-05 NOTE — Telephone Encounter (Signed)
°  BP reading for one week.  7/1 -  154/90 7/2 -  152/93 7/3 -  137/91 7/4 -  142/93 7/5 -  131/84 7/6 -  140/72 7/7 -  129/89 7/8 -  128/85  Please call and advise.

## 2014-02-06 NOTE — Telephone Encounter (Signed)
I am really pleased with these blood pressures!

## 2014-02-11 ENCOUNTER — Ambulatory Visit (HOSPITAL_COMMUNITY): Payer: Medicare Other | Attending: Cardiology

## 2014-02-11 DIAGNOSIS — Z87891 Personal history of nicotine dependence: Secondary | ICD-10-CM | POA: Insufficient documentation

## 2014-02-11 DIAGNOSIS — I379 Nonrheumatic pulmonary valve disorder, unspecified: Secondary | ICD-10-CM | POA: Insufficient documentation

## 2014-02-11 DIAGNOSIS — M1990 Unspecified osteoarthritis, unspecified site: Secondary | ICD-10-CM | POA: Insufficient documentation

## 2014-02-11 DIAGNOSIS — I079 Rheumatic tricuspid valve disease, unspecified: Secondary | ICD-10-CM | POA: Insufficient documentation

## 2014-02-11 DIAGNOSIS — Z85528 Personal history of other malignant neoplasm of kidney: Secondary | ICD-10-CM | POA: Insufficient documentation

## 2014-02-11 DIAGNOSIS — E785 Hyperlipidemia, unspecified: Secondary | ICD-10-CM | POA: Insufficient documentation

## 2014-02-11 DIAGNOSIS — G8929 Other chronic pain: Secondary | ICD-10-CM | POA: Insufficient documentation

## 2014-02-11 DIAGNOSIS — B1920 Unspecified viral hepatitis C without hepatic coma: Secondary | ICD-10-CM | POA: Insufficient documentation

## 2014-02-11 DIAGNOSIS — M545 Low back pain, unspecified: Secondary | ICD-10-CM | POA: Insufficient documentation

## 2014-02-11 DIAGNOSIS — I498 Other specified cardiac arrhythmias: Secondary | ICD-10-CM | POA: Insufficient documentation

## 2014-02-11 DIAGNOSIS — I517 Cardiomegaly: Secondary | ICD-10-CM | POA: Insufficient documentation

## 2014-02-11 DIAGNOSIS — I719 Aortic aneurysm of unspecified site, without rupture: Secondary | ICD-10-CM | POA: Insufficient documentation

## 2014-02-11 DIAGNOSIS — I10 Essential (primary) hypertension: Secondary | ICD-10-CM | POA: Insufficient documentation

## 2014-02-11 DIAGNOSIS — E119 Type 2 diabetes mellitus without complications: Secondary | ICD-10-CM | POA: Insufficient documentation

## 2014-02-11 DIAGNOSIS — I359 Nonrheumatic aortic valve disorder, unspecified: Secondary | ICD-10-CM | POA: Insufficient documentation

## 2014-02-11 NOTE — Progress Notes (Signed)
Echo completed

## 2014-03-06 ENCOUNTER — Telehealth: Payer: Self-pay | Admitting: *Deleted

## 2014-03-06 DIAGNOSIS — I471 Supraventricular tachycardia: Secondary | ICD-10-CM

## 2014-03-06 DIAGNOSIS — I10 Essential (primary) hypertension: Secondary | ICD-10-CM

## 2014-03-06 DIAGNOSIS — B1710 Acute hepatitis C without hepatic coma: Secondary | ICD-10-CM

## 2014-03-06 NOTE — Telephone Encounter (Signed)
Pt was contacted about mildly ascending aorta otherwise normal echo, normal heart function, and repeat the echo in 1 year per Dr Meda Coffee.  Pt verbalized understanding and pleased with this news.  Echo placed in epic and will go into a recall system.  Pt aware of this. Will send [facility] a message to put the echo in a 1 year recall.

## 2014-03-06 NOTE — Telephone Encounter (Signed)
Message copied by Nuala Alpha on Thu Mar 06, 2014 11:06 AM ------      Message from: Dorothy Spark      Created: Thu Mar 06, 2014 12:06 AM       Mildly dilated ascending aorta otherwise normal echo, normal heart function, We will repeat in 1 year. ------

## 2014-04-23 ENCOUNTER — Other Ambulatory Visit: Payer: Self-pay

## 2014-04-23 MED ORDER — CARVEDILOL 6.25 MG PO TABS: 6.2500 mg | ORAL_TABLET | Freq: Two times a day (BID) | ORAL | Status: DC

## 2014-06-18 LAB — HM DIABETES EYE EXAM

## 2014-07-01 ENCOUNTER — Ambulatory Visit (INDEPENDENT_AMBULATORY_CARE_PROVIDER_SITE_OTHER): Payer: Medicare Other | Admitting: Cardiology

## 2014-07-01 ENCOUNTER — Encounter: Payer: Self-pay | Admitting: Cardiology

## 2014-07-01 VITALS — BP 130/70 | HR 72 | Ht 71.0 in | Wt 211.8 lb

## 2014-07-01 DIAGNOSIS — I471 Supraventricular tachycardia: Secondary | ICD-10-CM

## 2014-07-01 DIAGNOSIS — I10 Essential (primary) hypertension: Secondary | ICD-10-CM

## 2014-07-01 DIAGNOSIS — E785 Hyperlipidemia, unspecified: Secondary | ICD-10-CM

## 2014-07-01 MED ORDER — CARVEDILOL 12.5 MG PO TABS: 12.5000 mg | ORAL_TABLET | Freq: Two times a day (BID) | ORAL | Status: DC

## 2014-07-01 NOTE — Progress Notes (Signed)
Patient ID: Timothy Dickson, male   DOB: [birth year], 68 y.o.   MRN: [id number] Patient ID: Timothy Dickson, male   DOB: Jul 29, 1946, 68 y.o.   MRN: [id number]    Patient Name: Timothy Dickson Date of Encounter: 07/01/2014  Primary Care Provider:  Scarlette Calico, MD Primary Cardiologist:  Ena Dawley H  Patient Profile Paroxysmal tachycardia  Problem List   Past Medical History  Diagnosis Date  . Osteoarthritis, knee   . Diverticulosis of colon     mild  . History of substance abuse   . Family history of renal cell carcinoma   . Hyperglycemia   . History of BPH   . Hypertension   . Tachycardia     not well documented, no episodes while wearing a 30 day monitor 9/14  . Pneumonia 1975    hx of 35 years ago  . GERD (gastroesophageal reflux disease)   . Headache(784.0)   . Cancer of kidney     right kidney  . 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 PVC's (premature ventricular contractions)    Past Surgical History  Procedure Laterality Date  . Arthroscopy, left knee,    . Left shoulder repair for bone spur  1990's  . History of liver biopsy  2007  . Transurethral needle ablation procedure    . Tuna procedure    . History of nephrectomy Right 1999  . Arthroscopy right knee      may '11 (Dr Alvan Dame)  . Knee arthroscopy Left 10/10/2012    Procedure: LEFT KNEE ARTHROSCOPY WITH MENSCIAL DEBRIDEMENT AND CHONDROPLASTY;  Surgeon: Gearlean Alf, MD;  Location: [facility];  Service: Orthopedics;  Laterality: Left;   Allergies  No Known Allergies  HPI  A very pleasant patient with h/o substance abuse and active hepatitis C, with paroxysmal tachycardia that occurs approximately twice a year. When checkig his pulse during the episode it was 190/minute. He feels slightly dizzy during the episodes. His PCP advised him to use Valsalva maneuvers that help to terminate the episodes. He feels that lately they have been increasing in frequency. He is scheduled for a Hepatitis C treatment  with Sofosburir/Ledipasvir in October. He also admits to have hypertension when checking his BP in the last couple of months. He was given Lasix PO for it that only worked temporarily. He is avid biker and denies any symptoms of chest pain or SOB.  12/2013 - The patient is coming back after 8 months, he underwent treatment for hepatitis C without any complications and is awaiting the final results from viral loads. He states that carvedilol helped him resolve he is tachycardia palpitations and he hasn't had any since he was started on carvedilol. On he states that he has been checking his blood pressure and he has been in 150s 160 range on multiple occasions.  07/01/2014 - he is doing well, he has had couple more episodes of palpitations lasting 30-45 seconds that resolved with Valsalva maneuvers. NO syncope. His BP has been high again - mostly in 140' and over 90.  He is tolerating Carvedilol well, no side effects.   Home Medications  Prior to Admission medications   Medication Sig Start Date End Date Taking? Authorizing Provider  furosemide (LASIX) 20 MG tablet TAKE 1 TABLET ONCE DAILY. 02/13/13  Yes Neena Rhymes, MD  ibuprofen (ADVIL,MOTRIN) 800 MG tablet Take 800 mg by mouth every 8 (eight) hours as needed for pain.   Yes Historical Provider, MD  KRILL  OIL PO Take 1 tablet by mouth daily.     Yes Historical Provider, MD  Multiple Vitamin (MULTIVITAMIN) capsule Take 1 capsule by mouth daily.   Yes Historical Provider, MD  naproxen sodium (ANAPROX) 220 MG tablet Take 220 mg by mouth 2 (two) times daily as needed.   Yes Historical Provider, MD  omeprazole (PRILOSEC) 20 MG capsule Take 20 mg by mouth daily.    Yes Historical Provider, MD    Family History  Family History  Problem Relation Age of Onset  . Coronary artery disease Mother   . Prostate cancer Brother   . Alcohol abuse Other     Strong family Hx  . Diabetes Sister   . Breast cancer Sister   . Lung cancer Sister   .  Esophageal cancer Sister   . CVA Mother   . Alcohol abuse Father   . Alcohol abuse Sister   . Cancer Brother     head and neck    Social History  History   Social History  . Marital Status: Married    Spouse Name: N/A    Number of Children: N/A  . Years of Education: N/A   Occupational History  . Not on file.   Social History Main Topics  . Smoking status: Former Smoker -- 1.00 packs/day for 15 years    Types: Cigarettes    Quit date: 08/01/1977  . Smokeless tobacco: Never Used  . Alcohol Use: No  . Drug Use: No  . Sexual Activity:    Partners: Female   Other Topics Concern  . Not on file   Social History Narrative   [place], [place] psych. Married- '84. 2 step-daughters, 4 grandchildren. 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 Marriage in good health.   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  Cardiovascular:  No chest pain, dyspnea on exertion, edema, orthopnea, + palpitations, paroxysmal nocturnal dyspnea. Dermatological: No rash, lesions/masses Respiratory: No cough, dyspnea Urologic: No hematuria, dysuria Abdominal:   No nausea, vomiting, diarrhea, bright red blood per rectum, melena, or hematemesis Neurologic:  No visual changes, wkns, changes in mental status. All other systems reviewed and are otherwise negative except as noted above.  Physical Exam  Blood pressure 130/70, pulse 72, height 5\' 11"  (1.803 m), weight 211 lb 12.8 oz (96.072 kg).  General: Pleasant, NAD Psych: Normal affect. Neuro: Alert and oriented X 3. Moves all extremities spontaneously. HEENT: Normal  Neck: Supple without bruits or JVD. Lungs:  Resp regular and unlabored, CTA. Heart: RRR no s3, s4, or murmurs. Abdomen: Soft, non-tender, non-distended, BS + x 4.  Extremities: No clubbing, cyanosis or edema. DP/PT/Radials 2+ and equal  bilaterally.  Accessory Clinical Findings  ECG - SR, normal ECG  TTE: 05/07/2012 Left ventricle: The cavity size was normal. Wall thickness was increased in a pattern of mild LVH. Systolic function was normal. The estimated ejection fraction was in the range of 60% to 65%. Wall motion was normal; there were no regional wall motion abnormalities. - Aorta: Root size is normal. But ascending aorta is mildly dilated at 55mm. - Mitral valve: Mild regurgitation. - Left atrium: The atrium was mildly dilated.    Assessment & Plan  68 years old male   1. 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 Resolved after starting Carvedilol 6.25 mg PO BID. I consulted our pharmacologist about possible interactions with scheduled therapy for Hepatitis C and there are none. The other consideration was Cardizem that is metabolized in the liver so BB appears as a better option. Tolerating it well and overall less frequent episodes.  2. Hypertension - we will increase carvedilol to 12.5 mg po BID. He will email [place] BP diary.  3. Lipid profile - recently checked by PCP and WNL. However, even if elevated we would be hesitant to start statins in the settings of liver impairment.  Follow up in 6 month.   Dorothy Spark, MD 07/01/2014, 9:54 AM

## 2014-07-01 NOTE — Patient Instructions (Signed)
Your physician has recommended you make the following change in your medication:   INCREASE YOUR CARVEDILOL TO 12.5 MG TWICE DAILY    Your physician wants you to follow-up in: [place] will receive a reminder letter in the mail two months in advance. If you don't receive a letter, please call our office to schedule the follow-up appointment.     PLEASE UTILIZE MY CHART AND EMAIL DR NELSON YOUR BP READINGS (INSTRUCTIONS WILL BE PROVIDED)

## 2014-07-10 LAB — HEPATIC FUNCTION PANEL
ALT: 23 U/L (ref 10–40)
AST: 36 U/L (ref 14–40)
Alkaline Phosphatase: 42 U/L (ref 25–125)
Bilirubin, Total: 0.8 mg/dL

## 2014-07-10 LAB — CBC AND DIFFERENTIAL
HCT: 47 % (ref 41–53)
Hemoglobin: 15.9 g/dL (ref 13.5–17.5)
Platelets: 196 10*3/uL (ref 150–399)
WBC: 7.4 10^3/mL

## 2014-07-10 LAB — BASIC METABOLIC PANEL
BUN: 25 mg/dL — AB (ref 4–21)
Creatinine: 1.3 mg/dL (ref 0.6–1.3)
Glucose: 167 mg/dL
Potassium: 3.8 mmol/L (ref 3.4–5.3)
Sodium: 136 mmol/L — AB (ref 137–147)

## 2014-08-13 ENCOUNTER — Encounter: Payer: Self-pay | Admitting: Cardiology

## 2014-08-13 NOTE — Telephone Encounter (Signed)
Tried to call patient and left message. Will route to Dr. Meda Coffee.

## 2014-08-14 ENCOUNTER — Telehealth: Payer: Self-pay | Admitting: Cardiology

## 2014-08-14 DIAGNOSIS — R002 Palpitations: Secondary | ICD-10-CM

## 2014-08-14 NOTE — Telephone Encounter (Signed)
Informed the pt that per Dr Meda Coffee she reviewed his email from [facility] from 1/13 and recommends that he get a 24 hour holter monitor for palpitations.  Informed the pt this will be put on in our office and he will be taught how to diary this when the episodes occur.  Pt states he has had several monitor placed on him before. Informed the pt that I will place the order in epic and send a message to [facility] to contact the pt and schedule this appt.  Pt verbalized understanding and agrees with this plan.

## 2014-08-14 NOTE — Telephone Encounter (Signed)
Will route this to Dr Meda Coffee for further review and recommendation.

## 2014-08-14 NOTE — Telephone Encounter (Signed)
Informed the pt that per Dr Meda Coffee she reviewed his email from [facility] from 1/13 and recommends that he get a 24 hour holter monitor for palpitations.  Informed the pt this will be put on in our office and he will be taught how to diary this when the episodes occur.  Pt states he has had several monitor placed on him before. Informed the pt that I will place the order in epic and send a message to [facility] to contact the pt and schedule this appt.  Pt verbalized understanding and agrees with this plan.

## 2014-08-14 NOTE — Telephone Encounter (Signed)
New Msg         Pt returning call from yesterday.    Please call pt [phone].

## 2014-08-14 NOTE — Telephone Encounter (Signed)
Informed the pt through mychart  that s perfectly normal, during the night the vagal tone increases and causes bradycardia. No need to worry about that unless its happening during the day and it is accompanied by symptoms like lightheadedness or syncope.

## 2014-08-18 ENCOUNTER — Encounter: Payer: Self-pay | Admitting: *Deleted

## 2014-08-18 ENCOUNTER — Encounter (INDEPENDENT_AMBULATORY_CARE_PROVIDER_SITE_OTHER): Payer: Medicare Other

## 2014-08-18 DIAGNOSIS — R002 Palpitations: Secondary | ICD-10-CM

## 2014-08-18 NOTE — Progress Notes (Signed)
Patient ID: Timothy Dickson, male   DOB: 1946/02/20, 69 y.o.   MRN: [id number] [employer] 24 hour holter monitor applied to patient.

## 2014-08-27 ENCOUNTER — Telehealth: Payer: Self-pay | Admitting: *Deleted

## 2014-08-27 DIAGNOSIS — I471 Supraventricular tachycardia: Secondary | ICD-10-CM

## 2014-08-27 MED ORDER — FLECAINIDE ACETATE 100 MG PO TABS: 100.0000 mg | ORAL_TABLET | Freq: Two times a day (BID) | ORAL | Status: DC

## 2014-08-27 NOTE — Telephone Encounter (Signed)
Contacted the pt to inform him of his 24 hour holter monitor results per Dr Meda Coffee.  Informed the pt that his monitor showed atrial tachycardia, and per Dr Meda Coffee he should start Flecainide 100 mg po BID, and have a gxt done 5 days after starting this medication.  Scheduled the pt to have a gxt done for next week Tuesday 09/02/14 at [facility] at 1 pm.  Informed the pt to start taking the Flecainide tomorrow 1/28. Informed the pt to report to [facility] on the day of his gxt 30 minutes prior to get registered at admissions.  Went over gxt instruction sheet with the pt and informed him to hold his hctz and coreg the morning of this test.  Informed the pt to drink water only and nothing to eat 4 hours prior and no caffeine 12 hours prior.  Also informed the pt that Dr Meda Coffee would also like to see him in the office in 3 weeks for a follow-up OV.  Informed the pt that my scheduler will call him to have this set up.  Confirmed the pharmacy of choice with the pt.  Pt verbalized understanding of instructions and new med order given and agrees with this plan.

## 2014-08-29 ENCOUNTER — Encounter: Payer: Self-pay | Admitting: Internal Medicine

## 2014-08-29 ENCOUNTER — Ambulatory Visit (INDEPENDENT_AMBULATORY_CARE_PROVIDER_SITE_OTHER): Payer: Medicare Other | Admitting: Internal Medicine

## 2014-08-29 ENCOUNTER — Other Ambulatory Visit (INDEPENDENT_AMBULATORY_CARE_PROVIDER_SITE_OTHER): Payer: Medicare Other

## 2014-08-29 VITALS — BP 106/62 | HR 72 | Temp 98.1°F | Resp 16 | Ht 71.0 in | Wt 209.0 lb

## 2014-08-29 DIAGNOSIS — B182 Chronic viral hepatitis C: Secondary | ICD-10-CM | POA: Insufficient documentation

## 2014-08-29 DIAGNOSIS — H60393 Other infective otitis externa, bilateral: Secondary | ICD-10-CM

## 2014-08-29 DIAGNOSIS — K7460 Unspecified cirrhosis of liver: Secondary | ICD-10-CM

## 2014-08-29 DIAGNOSIS — E1121 Type 2 diabetes mellitus with diabetic nephropathy: Secondary | ICD-10-CM

## 2014-08-29 DIAGNOSIS — K7469 Other cirrhosis of liver: Secondary | ICD-10-CM | POA: Insufficient documentation

## 2014-08-29 DIAGNOSIS — H60399 Other infective otitis externa, unspecified ear: Secondary | ICD-10-CM | POA: Insufficient documentation

## 2014-08-29 DIAGNOSIS — B1920 Unspecified viral hepatitis C without hepatic coma: Secondary | ICD-10-CM | POA: Insufficient documentation

## 2014-08-29 DIAGNOSIS — G5600 Carpal tunnel syndrome, unspecified upper limb: Secondary | ICD-10-CM

## 2014-08-29 DIAGNOSIS — I10 Essential (primary) hypertension: Secondary | ICD-10-CM

## 2014-08-29 LAB — URINALYSIS, ROUTINE W REFLEX MICROSCOPIC
Bilirubin Urine: NEGATIVE
Hgb urine dipstick: NEGATIVE
Ketones, ur: NEGATIVE
Leukocytes, UA: NEGATIVE
Nitrite: NEGATIVE
Specific Gravity, Urine: 1.01 (ref 1.000–1.030)
Total Protein, Urine: NEGATIVE
Urine Glucose: NEGATIVE
Urobilinogen, UA: 0.2 (ref 0.0–1.0)
pH: 6.5 (ref 5.0–8.0)

## 2014-08-29 LAB — HEMOGLOBIN A1C: Hgb A1c MFr Bld: 7 % — ABNORMAL HIGH (ref 4.6–6.5)

## 2014-08-29 LAB — BASIC METABOLIC PANEL
BUN: 25 mg/dL — ABNORMAL HIGH (ref 6–23)
CO2: 28 mEq/L (ref 19–32)
Calcium: 9.7 mg/dL (ref 8.4–10.5)
Chloride: 100 mEq/L (ref 96–112)
Creatinine, Ser: 1.23 mg/dL (ref 0.40–1.50)
GFR: 62.03 mL/min (ref >60.00)
Glucose, Bld: 111 mg/dL — ABNORMAL HIGH (ref 70–99)
Potassium: 3.7 mEq/L (ref 3.5–5.1)
Sodium: 135 mEq/L (ref 135–145)

## 2014-08-29 LAB — MICROALBUMIN / CREATININE URINE RATIO
Creatinine,U: 60.2 mg/dL
Microalb Creat Ratio: 0.5 mg/g (ref 0.0–30.0)
Microalb, Ur: 0.3 mg/dL (ref 0.0–1.9)

## 2014-08-29 MED ORDER — NEOMYCIN-POLYMYXIN-HC 1 % OT SOLN: 3.0000 [drp] | Freq: Three times a day (TID) | OTIC | Status: DC

## 2014-08-29 NOTE — Assessment & Plan Note (Signed)
He has EAC dermatitis Will treat with cortisporin otic susp

## 2014-08-29 NOTE — Progress Notes (Signed)
Subjective:    Patient ID: Timothy Dickson, male    DOB: 1945-10-31, 69 y.o.   MRN: [id number]  HPI Comments: He complains of numbness in his left hand (4th and 5th) fingers for about 4-5 months and he also complains of intermittent pain around his left medial wrist, he does not have any neck pain and there is no N/W/T elsewhere in his body.  Diabetes He presents for his follow-up diabetic visit. He has type 2 diabetes mellitus. His disease course has been stable. There are no hypoglycemic associated symptoms. Pertinent negatives for hypoglycemia include no dizziness, headaches, speech difficulty or tremors. There are no diabetic associated symptoms. Pertinent negatives for diabetes include no blurred vision, no chest pain, no fatigue, no foot paresthesias, no foot ulcerations, no polydipsia, no polyphagia, no polyuria, no visual change, no weakness and no weight loss. There are no hypoglycemic complications. Diabetic complications include nephropathy. When asked about current treatments, none were reported. He is compliant with treatment all of the time. He is following a generally healthy diet. Meal planning includes avoidance of concentrated sweets. He participates in exercise intermittently. There is no change in his home blood glucose trend. An ACE inhibitor/angiotensin II receptor blocker is contraindicated. He does not see a podiatrist.Eye exam is current.      Review of Systems  Constitutional: Negative.  Negative for fever, chills, weight loss, diaphoresis, appetite change and fatigue.  HENT: Negative.  Negative for congestion, ear pain, postnasal drip, rhinorrhea, sinus pressure, sneezing, sore throat and trouble swallowing.        His ears itch  Eyes: Negative.  Negative for blurred vision.  Respiratory: Negative.  Negative for choking, chest tightness, shortness of breath and stridor.   Cardiovascular: Negative.  Negative for chest pain, palpitations and leg swelling.  Gastrointestinal:  Negative.  Negative for abdominal pain.  Endocrine: Negative.  Negative for polydipsia, polyphagia and polyuria.  Genitourinary: Negative.   Musculoskeletal: Positive for arthralgias. Negative for myalgias, back pain and neck pain.  Skin: Negative.   Allergic/Immunologic: Negative.   Neurological: Positive for numbness. Negative for dizziness, tremors, syncope, facial asymmetry, speech difficulty, weakness, light-headedness and headaches.  Hematological: Negative.   Psychiatric/Behavioral: Negative.        Objective:   Physical Exam  Constitutional: He is oriented to person, place, and time. He appears well-developed and well-nourished. No distress.  HENT:  Head: Normocephalic and atraumatic.  Right Ear: Hearing, tympanic membrane, external ear and ear canal normal.  Left Ear: Hearing, tympanic membrane, external ear and ear canal normal.  Mouth/Throat: Oropharynx is clear and moist. No oropharyngeal exudate.  Both EAC's show scaling and xerosis  Eyes: Conjunctivae are normal. Right eye exhibits no discharge. Left eye exhibits no discharge. No scleral icterus.  Neck: Normal range of motion. Neck supple. No JVD present. No tracheal deviation present. No thyromegaly present.  Cardiovascular: Normal rate, regular rhythm, normal heart sounds and intact distal pulses.  Exam reveals no gallop and no friction rub.   No murmur heard. Pulmonary/Chest: Effort normal and breath sounds normal. No stridor. No respiratory distress. He has no wheezes. He has no rales. He exhibits no tenderness.  Abdominal: Soft. Bowel sounds are normal. He exhibits no distension and no mass. There is no tenderness. There is no rebound and no guarding.  Musculoskeletal: Normal range of motion. He exhibits no edema or tenderness.       Left wrist: He exhibits deformity. He exhibits normal range of motion, no tenderness, no bony tenderness, no  swelling, no effusion, no crepitus and no laceration.  Left wrist + tinel's and  phalen's tests  Lymphadenopathy:    He has no cervical adenopathy.  Neurological: He is alert and oriented to person, place, and time. He has normal strength. He displays no atrophy, no tremor and normal reflexes. No cranial nerve deficit or sensory deficit. He exhibits normal muscle tone. He displays a negative Romberg sign. He displays no seizure activity. Coordination and gait normal.  Reflex Scores:      Tricep reflexes are 1+ on the right side and 1+ on the left side.      Bicep reflexes are 1+ on the right side and 1+ on the left side.      Brachioradialis reflexes are 1+ on the right side and 1+ on the left side.      Patellar reflexes are 1+ on the right side and 1+ on the left side.      Achilles reflexes are 1+ on the right side and 1+ on the left side. Skin: Skin is warm and dry. No rash noted. He is not diaphoretic. No erythema. No pallor.  Vitals reviewed.    Lab Results  Component Value Date   WBC 7.4 07/10/2014   HGB 15.9 07/10/2014   HCT 47 07/10/2014   PLT 196 07/10/2014   GLUCOSE 172* 10/28/2013   CHOL 198 10/28/2013   TRIG 138.0 10/28/2013   HDL 39.80 10/28/2013   LDLCALC 131* 10/28/2013   ALT 23 07/10/2014   AST 36 07/10/2014   NA 136* 07/10/2014   K 3.8 07/10/2014   CL 99 10/28/2013   CREATININE 1.3 07/10/2014   BUN 25* 07/10/2014   CO2 27 10/28/2013   TSH 1.34 10/28/2013   PSA 0.6 09/16/2013   INR 1.0 07/31/2008   HGBA1C 7.0* 10/28/2013   MICROALBUR 0.9 10/28/2013       Assessment & Plan:

## 2014-08-29 NOTE — Assessment & Plan Note (Signed)
His BP is well controlled Will monitor his lytes and renal function today

## 2014-08-29 NOTE — Assessment & Plan Note (Signed)
I will recheck his A1C and will treat if needed Will cont to monitor his renal function

## 2014-08-29 NOTE — Assessment & Plan Note (Signed)
Will get a NCS and EMG done to see how severe this is

## 2014-08-29 NOTE — Patient Instructions (Signed)
Carpal Tunnel Syndrome The carpal tunnel is a narrow area located on the palm side of your wrist. The tunnel is formed by the wrist bones and ligaments. Nerves, blood vessels, and tendons pass through the carpal tunnel. Repeated wrist motion or certain diseases may cause swelling within the tunnel. This swelling pinches the main nerve in the wrist (median nerve) and causes the painful hand and arm condition called carpal tunnel syndrome. CAUSES   Repeated wrist motions.  Wrist injuries.  Certain diseases like arthritis, diabetes, alcoholism, hyperthyroidism, and kidney failure.  Obesity.  Pregnancy. SYMPTOMS   A "pins and needles" feeling in your fingers or hand, especially in your thumb, index and middle fingers.  Tingling or numbness in your fingers or hand.  An aching feeling in your entire arm, especially when your wrist and elbow are bent for long periods of time.  Wrist pain that goes up your arm to your shoulder.  Pain that goes down into your palm or fingers.  A weak feeling in your hands. DIAGNOSIS  Your health care provider will take your history and perform a physical exam. An electromyography test may be needed. This test measures electrical signals sent out by your nerves into the muscles. The electrical signals are usually slowed by carpal tunnel syndrome. You may also need X-rays. TREATMENT  Carpal tunnel syndrome may clear up by itself. Your health care provider may recommend a wrist splint or medicine such as a nonsteroidal anti-inflammatory medicine. Cortisone injections may help. Sometimes, surgery may be needed to free the pinched nerve.  HOME CARE INSTRUCTIONS   Take all medicine as directed by your health care provider. Only take over-the-counter or prescription medicines for pain, discomfort, or fever as directed by your health care provider.  If you were given a splint to keep your wrist from bending, wear it as directed. It is important to wear the splint at  night. Wear the splint for as long as you have pain or numbness in your hand, arm, or wrist. This may take 1 to 2 months.  Rest your wrist from any activity that may be causing your pain. If your symptoms are work-related, you may need to talk to your employer about changing to a job that does not require using your wrist.  Put ice on your wrist after long periods of wrist activity.  Put ice in a plastic bag.  Place a towel between your skin and the bag.  Leave the ice on for 15-20 minutes, 03-04 times a day.  Keep all follow-up visits as directed by your health care provider. This includes any orthopedic referrals, physical therapy, and rehabilitation. Any delay in getting necessary care could result in a delay or failure of your condition to heal. SEEK IMMEDIATE MEDICAL CARE IF:   You have new, unexplained symptoms.  Your symptoms get worse and are not helped or controlled with medicines. MAKE SURE YOU:   Understand these instructions.  Will watch your condition.  Will get help right away if you are not doing well or get worse. Document Released: 07/15/2000 Document Revised: 12/02/2013 Document Reviewed: 06/03/2011 Exit[employer] Patient Information 2015 [employer]. This information is not intended to replace advice given to you by your health care provider. Make sure you discuss any questions you have with your health care provider.  

## 2014-08-30 ENCOUNTER — Encounter: Payer: Self-pay | Admitting: Internal Medicine

## 2014-09-02 ENCOUNTER — Ambulatory Visit (HOSPITAL_COMMUNITY)
Admission: RE | Admit: 2014-09-02 | Discharge: 2014-09-02 | Disposition: A | Discharge status: Home / Self Care | Payer: Medicare Other | Source: Ambulatory Visit | Attending: Cardiology | Admitting: Cardiology

## 2014-09-02 DIAGNOSIS — I471 Supraventricular tachycardia: Secondary | ICD-10-CM

## 2014-09-02 DIAGNOSIS — R000 Tachycardia, unspecified: Secondary | ICD-10-CM

## 2014-09-02 NOTE — Progress Notes (Signed)
    Underwent GXT stress test s/p starting Flecainide. Tolerated procedure well. He reached his target HR of 129 and walked for 9 minutes on bruce protocol. No acute ST or TW changes. No ventricular or supraventricular arrhythmias. Mild SOB no CP.   Perry Mount PA-C  [facility]

## 2014-09-09 ENCOUNTER — Encounter: Payer: Self-pay | Admitting: Cardiology

## 2014-09-09 ENCOUNTER — Ambulatory Visit (INDEPENDENT_AMBULATORY_CARE_PROVIDER_SITE_OTHER): Payer: Medicare Other | Admitting: Cardiology

## 2014-09-09 VITALS — BP 118/74 | HR 64 | Ht 71.0 in | Wt 209.0 lb

## 2014-09-09 DIAGNOSIS — I471 Supraventricular tachycardia: Secondary | ICD-10-CM | POA: Insufficient documentation

## 2014-09-09 DIAGNOSIS — I10 Essential (primary) hypertension: Secondary | ICD-10-CM

## 2014-09-09 DIAGNOSIS — I4719 Other supraventricular tachycardia: Secondary | ICD-10-CM | POA: Insufficient documentation

## 2014-09-09 NOTE — Progress Notes (Signed)
Patient ID: Dijuan Sleeth, male   DOB: [birth year], 69 y.o.   MRN: [id number]    Patient Name: Timothy Dickson Date of Encounter: 09/09/2014  Primary Care Provider:  Scarlette Calico, MD Primary Cardiologist:  Ena Dawley H  Patient Profile Paroxysmal atrial tachycardia.  Problem List   Past Medical History  Diagnosis Date  . Osteoarthritis, knee   . Diverticulosis of colon     mild  . History of substance abuse   . Family history of renal cell carcinoma   . Hyperglycemia   . History of BPH   . Hypertension   . Tachycardia     not well documented, no episodes while wearing a 30 day monitor 9/14  . Pneumonia 1975    hx of 35 years ago  . GERD (gastroesophageal reflux disease)   . Headache(784.0)   . Cancer of kidney     right kidney  . 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 PVC's (premature ventricular contractions)    Past Surgical History  Procedure Laterality Date  . Arthroscopy, left knee,    . Left shoulder repair for bone spur  1990's  . History of liver biopsy  2007  . Transurethral needle ablation procedure    . Tuna procedure    . History of nephrectomy Right 1999  . Arthroscopy right knee      may '11 (Dr Alvan Dame)  . Knee arthroscopy Left 10/10/2012    Procedure: LEFT KNEE ARTHROSCOPY WITH MENSCIAL DEBRIDEMENT AND CHONDROPLASTY;  Surgeon: Gearlean Alf, MD;  Location: [facility];  Service: Orthopedics;  Laterality: Left;   Allergies  No Known Allergies  HPI  A very pleasant patient with h/o substance abuse and active hepatitis C, with paroxysmal tachycardia that occurs approximately twice a year. When checkig his pulse during the episode it was 190/minute. He feels slightly dizzy during the episodes. His PCP advised him to use Valsalva maneuvers that help to terminate the episodes. He feels that lately they have been increasing in frequency. He is scheduled for a Hepatitis C treatment with Sofosburir/Ledipasvir in October. He also admits to have  hypertension when checking his BP in the last couple of months. He was given Lasix PO for it that only worked temporarily. He is avid biker and denies any symptoms of chest pain or SOB.  12/2013 - The patient is coming back after 8 months, he underwent treatment for hepatitis C without any complications and is awaiting the final results from viral loads. He states that carvedilol helped him resolve he is tachycardia palpitations and he hasn't had any since he was started on carvedilol. On he states that he has been checking his blood pressure and he has been in 150s 160 range on multiple occasions.  07/01/2014 - he is doing well, he has had couple more episodes of palpitations lasting 30-45 seconds that resolved with Valsalva maneuvers. NO syncope. His BP has been high again - mostly in 140' and over 90.  He is tolerating Carvedilol well, no side effects.  The patient underwent Holter monitoring that showed multiple episodes of atrial tachycardia and he was started on flecainide, on 09/02/14 - Underwent GXT stress test s/p starting Flecainide. Tolerated procedure well. He reached his target HR of 129 and walked for 9 minutes on bruce protocol. No acute ST or TW changes. No ventricular or supraventricular arrhythmias. Mild SOB no CP.   09/08/2014 - patient is coming 1 week after starting flecainide, he denies any side effects  denies any palpitations. He denies any chest pain or shortness of breath. He is interested in going to talk to EP physician for the consideration of possible ablation.   Home Medications  Prior to Admission medications   Medication Sig Start Date End Date Taking? Authorizing Provider  furosemide (LASIX) 20 MG tablet TAKE 1 TABLET ONCE DAILY. 02/13/13  Yes Neena Rhymes, MD  ibuprofen (ADVIL,MOTRIN) 800 MG tablet Take 800 mg by mouth every 8 (eight) hours as needed for pain.   Yes Historical Provider, MD  KRILL OIL PO Take 1 tablet by mouth daily.     Yes Historical Provider, MD    Multiple Vitamin (MULTIVITAMIN) capsule Take 1 capsule by mouth daily.   Yes Historical Provider, MD  naproxen sodium (ANAPROX) 220 MG tablet Take 220 mg by mouth 2 (two) times daily as needed.   Yes Historical Provider, MD  omeprazole (PRILOSEC) 20 MG capsule Take 20 mg by mouth daily.    Yes Historical Provider, MD    Family History  Family History  Problem Relation Age of Onset  . Coronary artery disease Mother   . Prostate cancer Brother   . Alcohol abuse Other     Strong family Hx  . Diabetes Sister   . Breast cancer Sister   . Lung cancer Sister   . Esophageal cancer Sister   . CVA Mother   . Alcohol abuse Father   . Alcohol abuse Sister   . Cancer Brother     head and neck    Social History  History   Social History  . Marital Status: Married    Spouse Name: N/A    Number of Children: N/A  . Years of Education: N/A   Occupational History  . Not on file.   Social History Main Topics  . Smoking status: Former Smoker -- 1.00 packs/day for 15 years    Types: Cigarettes    Quit date: 08/01/1977  . Smokeless tobacco: Never Used  . Alcohol Use: No  . Drug Use: No  . Sexual Activity:    Partners: Female   Other Topics Concern  . Not on file   Social History Narrative   [place], [place]. Married- '84. 2 step-daughters, 4 grandchildren. 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 Marriage in good health.   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  Cardiovascular:  No chest pain, dyspnea on exertion, edema, orthopnea, + palpitations, paroxysmal nocturnal dyspnea. Dermatological: No rash, lesions/masses Respiratory: No cough, dyspnea Urologic: No hematuria, dysuria Abdominal:   No nausea, vomiting, diarrhea, bright red blood per rectum, melena, or hematemesis Neurologic:  No visual changes, wkns, changes in mental  status. All other systems reviewed and are otherwise negative except as noted above.  Physical Exam  Blood pressure 118/74, pulse 64, height 5\' 11"  (1.803 m), weight 209 lb (94.802 kg).  General: Pleasant, NAD Psych: Normal affect. Neuro: Alert and oriented X 3. Moves all extremities spontaneously. HEENT: Normal  Neck: Supple without bruits or JVD. Lungs:  Resp regular and unlabored, CTA. Heart: RRR no s3, s4, or murmurs. Abdomen: Soft, non-tender, non-distended, BS + x 4.  Extremities: No clubbing, cyanosis or edema. DP/PT/Radials 2+ and equal bilaterally.  Accessory Clinical Findings  ECG - SR, normal ECG  TTE: 05/07/2012 Left ventricle: The cavity size was normal. Wall  thickness was increased in a pattern of mild LVH. Systolic function was normal. The estimated ejection fraction was in the range of 60% to 65%. Wall motion was normal; there were no regional wall motion abnormalities. - Aorta: Root size is normal. But ascending aorta is mildly dilated at 41mm. - Mitral valve: Mild regurgitation. - Left atrium: The atrium was mildly dilated.    Assessment & Plan  69 years old male   1. Paroxysmal atrial tachycardia - based on character - abrupt start and end, termination upon Valsalva maneuvers, now resolved after starting flecainide be normal exercise treadmill stress test post initiation. We will continue, however he is interested in possible ablation as he doesn't want to be on medicine definitely.  2. Hypertension - controlled after increasing carvedilol to 12.5 mg po BID.   3. Lipid profile - recently checked by PCP and WNL. However, even if elevated we would be hesitant to start statins in the settings of liver impairment ( undergoing therapy for hepatitis C).  Follow up in 6 month.   Dorothy Spark, MD 09/09/2014, 9:18 AM

## 2014-09-09 NOTE — Patient Instructions (Signed)
Your physician recommends that you continue on your current medications as directed. Please refer to the Current Medication list given to you today.   You have been referred to [facility]     Your physician recommends that you schedule a follow-up appointment in: [place]

## 2014-09-26 ENCOUNTER — Ambulatory Visit: Payer: Medicare Other | Admitting: Cardiology

## 2014-09-29 ENCOUNTER — Ambulatory Visit: Payer: Medicare Other | Admitting: Cardiology

## 2014-10-13 ENCOUNTER — Ambulatory Visit (INDEPENDENT_AMBULATORY_CARE_PROVIDER_SITE_OTHER): Payer: Medicare Other | Admitting: Neurology

## 2014-10-13 ENCOUNTER — Other Ambulatory Visit: Payer: Self-pay | Admitting: *Deleted

## 2014-10-13 ENCOUNTER — Encounter: Payer: Self-pay | Admitting: Internal Medicine

## 2014-10-13 ENCOUNTER — Ambulatory Visit (INDEPENDENT_AMBULATORY_CARE_PROVIDER_SITE_OTHER): Payer: Medicare Other | Admitting: Internal Medicine

## 2014-10-13 ENCOUNTER — Telehealth: Payer: Self-pay

## 2014-10-13 ENCOUNTER — Other Ambulatory Visit: Payer: Self-pay | Admitting: Internal Medicine

## 2014-10-13 ENCOUNTER — Encounter: Payer: Self-pay | Admitting: Cardiology

## 2014-10-13 VITALS — BP 114/84 | HR 55 | Ht 72.0 in | Wt 210.6 lb

## 2014-10-13 DIAGNOSIS — G5602 Carpal tunnel syndrome, left upper limb: Secondary | ICD-10-CM

## 2014-10-13 DIAGNOSIS — I491 Atrial premature depolarization: Secondary | ICD-10-CM

## 2014-10-13 DIAGNOSIS — G5620 Lesion of ulnar nerve, unspecified upper limb: Secondary | ICD-10-CM

## 2014-10-13 DIAGNOSIS — I471 Supraventricular tachycardia: Secondary | ICD-10-CM

## 2014-10-13 DIAGNOSIS — G5603 Carpal tunnel syndrome, bilateral upper limbs: Secondary | ICD-10-CM | POA: Insufficient documentation

## 2014-10-13 DIAGNOSIS — G5601 Carpal tunnel syndrome, right upper limb: Secondary | ICD-10-CM

## 2014-10-13 DIAGNOSIS — G5623 Lesion of ulnar nerve, bilateral upper limbs: Secondary | ICD-10-CM | POA: Insufficient documentation

## 2014-10-13 DIAGNOSIS — I10 Essential (primary) hypertension: Secondary | ICD-10-CM

## 2014-10-13 DIAGNOSIS — G5600 Carpal tunnel syndrome, unspecified upper limb: Secondary | ICD-10-CM

## 2014-10-13 MED ORDER — ASPIRIN EC 81 MG PO TBEC: 81.0000 mg | DELAYED_RELEASE_TABLET | Freq: Every day | ORAL | Status: DC

## 2014-10-13 NOTE — Patient Instructions (Signed)
Your physician wants you to follow-up in: 12 months with Dr Allred You will receive a reminder letter in the mail two months in advance. If you don't receive a letter, please call our office to schedule the follow-up appointment.  

## 2014-10-13 NOTE — Procedures (Signed)
[facility]  [address]  [place], [place] [address] Tel: [phone] Fax:  [phone] Test Date:  10/13/2014  Patient: Timothy Dickson DOB: [birth year] Physician: Narda Amber  Sex: Male Height: 6' " Ref Phys: Scarlette Calico   ID#: [id number]   Technician: Laureen Ochs R. NCS T.   Patient Complaints: Patient is a 69 year old male here for evaluation of left worse than right hand paresthesias.  NCV & EMG Findings: Extensive electrodiagnostic testing of the left upper extremity and additional studies the right shows:  1. Bilateral median sensory responses are prolonged and there is mildly reduced amplitude on the right. Left ulnar sensory response is absent and the right is markedly reduced and prolonged. Bilateral radial sensory responses are within normal limits. 2. The right median motor response shows borderline prolongation in latency with preserved amplitude. The left median motor responses within normal limits. Bilateral ulnar motor studies show marked slowing across the elbow and there is evidence of conduction block on the left across the elbow. 3. Active on chronic motor axon loss changes are seen affecting the left ulnar-innervated muscles. Sparse chronic motor axon loss changes are seen affecting the first dorsal interosseous muscles on the right only.   Impression: 1. 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 Overall, these findings are moderate in degree electrically. 2. 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 Mild to moderate in degree electrically.  3. Bilateral median neuropathy at or distal to the wrist, consistent with the clinical diagnosis of carpal tunnel syndrome. Overall, these findings are mild to moderate in degree electrically, and worse on the right.     ___________________________ Narda Amber    Nerve Conduction  Studies Anti Sensory Summary Table   Stim Site NR Peak (ms) Norm Peak (ms) P-T Amp (V) Norm P-T Amp  Left Median Anti Sensory (2nd Digit)  32C    15 cm pt is 6'0  Wrist    4.5 <3.8 10.5 >10  Right Median Anti Sensory (2nd Digit)  32C    15 cm pt is 6'0  Wrist    4.4 <3.8 9.2 >10  Left Radial Anti Sensory (Base 1st Digit)  32C  Wrist    2.5 <2.8 14.8 >10  Right Radial Anti Sensory (Base 1st Digit)  32C  Wrist    2.6 <2.8 14.3 >10  Left Ulnar Anti Sensory (5th Digit)  32C  Wrist NR  <3.2  >5  Right Ulnar Anti Sensory (5th Digit)  32C  Wrist    3.5 <3.2 3.7 >5   Motor Summary Table   Stim Site NR Onset (ms) Norm Onset (ms) O-P Amp (mV) Norm O-P Amp Site1 Site2 Delta-0 (ms) Dist (cm) Vel (m/s) Norm Vel (m/s)  Left Median Motor (Abd Poll Brev)  32C  Wrist    3.9 <4.0 10.3 >5 Elbow Wrist 5.9 30.0 51 >50  Elbow    9.8  9.9         Right Median Motor (Abd Poll Brev)  32C  Wrist    4.1 <4.0 8.1 >5 Elbow Wrist 5.5 30.0 55 >50  Elbow    9.6  8.1         Left Ulnar Motor (Abd Dig Minimi)  32C  Wrist    2.8 <3.1 7.2 >7 B Elbow Wrist 5.2 27.0 52 >50  B Elbow    8.0  5.8  A  Elbow B Elbow 4.2 10.0 24 >50  A Elbow    12.2  3.5         Right Ulnar Motor (Abd Dig Minimi)  32C  Wrist    2.8 <3.1 9.4 >7 B Elbow Wrist 4.6 26.0 57 >50  B Elbow    7.4  8.6  A Elbow B Elbow 2.8 10.0 36 >50  A Elbow    10.2  8.3          F Wave Studies   NR F-Lat (ms) Lat Norm (ms) L-R F-Lat (ms)  Left Ulnar (Mrkrs) (Abd Dig Min)  32C    6.0 feet     37.94 <33 5.54  Right Ulnar (Mrkrs) (Abd Dig Min)  32C     32.40 <33 5.54   EMG   Side Muscle Ins Act Fibs Psw Fasc Number Recrt Dur Dur. Amp Amp. Poly Poly. Comment  Right 1stDorInt Nml Nml Nml Nml 1- Rapid Some 1+ Nml Nml Nml Nml N/A  Right Abd Poll Brev Nml Nml Nml Nml Nml Nml Nml Nml Nml Nml Nml Nml N/A  Right Ext Indicis Nml Nml Nml Nml Nml Nml Nml Nml Nml Nml Nml Nml N/A  Right PronatorTeres Nml Nml Nml Nml Nml Nml Nml Nml Nml Nml Nml Nml N/A   Right Biceps Nml Nml Nml Nml Nml Nml Nml Nml Nml Nml Nml Nml N/A  Right Triceps Nml Nml Nml Nml Nml Nml Nml Nml Nml Nml Nml Nml N/A  Right FlexDigProf 4,5 Nml Nml Nml Nml Nml Nml Nml Nml Nml Nml Nml Nml N/A  Right ABD Dig Min Nml Nml Nml Nml Nml Nml Nml Nml Nml Nml Nml Nml N/A  Left 1stDorInt Nml Nml 1+ Nml 1- Rapid Some 1+ Nml Nml Nml Nml N/A  Left Abd Poll Brev Nml Nml Nml Nml Nml Nml Nml Nml Nml Nml Nml Nml N/A  Left ABD Dig Min Nml Nml 1+ Nml 1- Rapid Some 1+ Nml Nml Nml Nml N/A  Left Ext Indicis Nml Nml Nml Nml Nml Nml Nml Nml Nml Nml Nml Nml N/A  Left PronatorTeres Nml Nml Nml Nml Nml Nml Nml Nml Nml Nml Nml Nml N/A  Left Biceps Nml Nml Nml Nml Nml Nml Nml Nml Nml Nml Nml Nml N/A  Left Triceps Nml Nml Nml Nml Nml Nml Nml Nml Nml Nml Nml Nml N/A  Left Deltoid Nml Nml Nml Nml Nml Nml Nml Nml Nml Nml Nml Nml N/A  Left FlexDigProf 4,5 Nml Nml Nml Nml 1- Rapid Nml 1+ Nml Nml Nml Nml N/A      Waveforms:

## 2014-10-13 NOTE — Telephone Encounter (Signed)
Patient stated he rec'd flu vaccine in Oct 2015 but does not remember where.

## 2014-10-13 NOTE — Progress Notes (Signed)
Electrophysiology Office Note   Date:  10/13/2014   ID:  Timothy Dickson, DOB January 17, 1946, MRN [id number]  PCP:  Scarlette Calico, MD  Cardiologist:  Dr Meda Coffee Primary Electrophysiologist: Thompson Grayer, MD    Chief Complaint  Patient presents with  . Appointment    Atrial tachycardia     History of Present Illness: Timothy Dickson is a 69 y.o. male who presents today for electrophysiology evaluation.   He reports abrupt onset/ offset tachypalpitations over the last 7-8 years.  He reports 'heart pounding" with pulse rate of 190 bpm.  This episode lasted about 20 minutes and spontaneously resolved prior to arriving to the ER.  He was followed by Dr Linda Hedges and found that vagal maneuvers would terminate episodes typically within 30 seconds or so.  He did well with this for several years.  Over the past couple years, he has had more "skipped" heart beats and short lived palpitations.  He has also had episodic SVT for which vegal maneuvers continued to be effective.  He was evaluated by Dr Meda Coffee and placed on coreg.  This initially helped palpitations but subsequently episodes resumed. He has done very well with flecainide.  He is pleased with his current health state.  Today, he denies symptoms of chest pain, shortness of breath, orthopnea, PND, lower extremity edema, claudication, dizziness, presyncope, syncope, bleeding, or neurologic sequela. The patient is tolerating medications without difficulties and is otherwise without complaint today.    Past Medical History  Diagnosis Date  . Osteoarthritis, knee   . Diverticulosis of colon     mild  . History of substance abuse   . Hyperglycemia   . History of BPH   . Hypertension   . Atrial tachycardia     documented by holter 2016  . Pneumonia 1975    hx of 35 years ago  . GERD (gastroesophageal reflux disease)   . Headache(784.0)   . Cancer of kidney     right kidney  . Cirrhosis   . Hepatitis C     s/p therapy [facility] (curative)  . CRI  (chronic renal insufficiency)     s/p R nephrectomy in 1999 for malignancy   Past Surgical History  Procedure Laterality Date  . Arthroscopy, left knee,    . Left shoulder repair for bone spur  1990's  . History of liver biopsy  2007  . Transurethral needle ablation procedure    . Tuna procedure    . History of nephrectomy Right 1999  . Arthroscopy right knee      may '11 (Dr Alvan Dame)  . Knee arthroscopy Left 10/10/2012    Procedure: LEFT KNEE ARTHROSCOPY WITH MENSCIAL DEBRIDEMENT AND CHONDROPLASTY;  Surgeon: Gearlean Alf, MD;  Location: [facility];  Service: Orthopedics;  Laterality: Left;     Current Outpatient Prescriptions  Medication Sig Dispense Refill  . carvedilol (COREG) 12.5 MG tablet Take 1 tablet (12.5 mg total) by mouth 2 (two) times daily with a meal. 180 tablet 6  . flecainide (TAMBOCOR) 100 MG tablet Take 1 tablet (100 mg total) by mouth 2 (two) times daily. 90 tablet 3  . hydrochlorothiazide (HYDRODIURIL) 25 MG tablet Take 1 tablet (25 mg total) by mouth daily. 90 tablet 3   No current facility-administered medications for this visit.    Allergies:   Review of patient's allergies indicates no known allergies.   Social History:  The patient  reports that he quit smoking about 37 years ago. His smoking use included Cigarettes. He  has a 15 pack-year smoking history. He has never used smokeless tobacco. He reports that he does not drink alcohol or use illicit drugs.   Family History:  The patient's  family history includes Alcohol abuse in his father, other, and sister; Breast cancer in his sister; CVA in his mother; Cancer in his brother; Coronary artery disease in his mother; Diabetes in his sister; Esophageal cancer in his sister; Lung cancer in his sister; Prostate cancer in his brother.    ROS:  Please see the history of present illness.   All other systems are reviewed and negative.    PHYSICAL EXAM: VS:  BP 114/84 mmHg  Pulse 55  Ht 6' (1.829 m)  Wt 210 lb 9.6  oz (95.528 kg)  BMI 28.56 kg/m2 , BMI Body mass index is 28.56 kg/(m^2). GEN: Well nourished, well developed, in no acute distress HEENT: normal Neck: no JVD, carotid bruits, or masses Cardiac: RRR; no murmurs, rubs, or gallops,no edema  Respiratory:  clear to auscultation bilaterally, normal work of breathing GI: soft, nontender, nondistended, + BS MS: no deformity or atrophy Skin: warm and dry  Neuro:  Strength and sensation are intact Psych: euthymic mood, full affect  EKG:  EKG is ordered today. The ekg ordered today shows sinus rhythm 55 bpm, otherwise normal ekg   Recent Labs: 10/28/2013: TSH 1.34 07/10/2014: ALT 23; Hemoglobin 15.9; Platelets 196 08/29/2014: BUN 25*; Creatinine 1.23; Potassium 3.7; Sodium 135    Lipid Panel     Component Value Date/Time   CHOL 198 10/28/2013 1116   TRIG 138.0 10/28/2013 1116   HDL 39.80 10/28/2013 1116   CHOLHDL 5 10/28/2013 1116   VLDL 27.6 10/28/2013 1116   LDLCALC 131* 10/28/2013 1116     Wt Readings from Last 3 Encounters:  10/13/14 210 lb 9.6 oz (95.528 kg)  09/09/14 209 lb (94.802 kg)  08/29/14 209 lb (94.802 kg)      Other studies Reviewed: Additional studies/ records that were reviewed today include: echo     ASSESSMENT AND PLAN:  1.  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  He is doing very well with coreg and flecainide. He has documented pacs and nonsustained atach which were also causing symptoms of palpitations.  I suspect that these are independent of his arrhythmia which terminates with vagal maneuvers. Therapeutic strategies for supraventricular tachycardia including medicine and ablation were discussed in detail with the patient today. Risk, benefits, and alternatives to EP study and radiofrequency ablation were also discussed in detail today. These risks include but are not limited to stroke, bleeding, vascular damage, tamponade, perforation, damage to the heart and other structures, AV block  requiring pacemaker, worsening renal function, and death. The patient understands these risk and wishes to avoid ablation at this time.  I have suggested that even if we were to successfully ablate his svt, he would continue to have pacs and nonsustained atach which would require flecainide therapy.  Would continue current therapy and avoid ablation at this time. If his arrhythmia remains controlled, could possibly reduce flecainide to 50mg  BID in the future.  2. HTN Stable No change required today   Follow-up with Dr Meda Coffee as scheduled.  I am happy to see in a year or as needed going forward should his arrhythmia worsen   Signed, Thompson Grayer, MD  10/13/2014 9:07 AM     [facility] [address] [place] [address] [phone] (office) [phone] (fax)

## 2014-10-20 ENCOUNTER — Encounter: Payer: Self-pay | Admitting: Family Medicine

## 2014-10-20 ENCOUNTER — Other Ambulatory Visit (INDEPENDENT_AMBULATORY_CARE_PROVIDER_SITE_OTHER): Payer: Medicare Other

## 2014-10-20 ENCOUNTER — Ambulatory Visit (INDEPENDENT_AMBULATORY_CARE_PROVIDER_SITE_OTHER): Payer: Medicare Other | Admitting: Family Medicine

## 2014-10-20 VITALS — BP 96/70 | HR 62 | Ht 72.0 in | Wt 211.0 lb

## 2014-10-20 DIAGNOSIS — G5601 Carpal tunnel syndrome, right upper limb: Secondary | ICD-10-CM

## 2014-10-20 DIAGNOSIS — G5603 Carpal tunnel syndrome, bilateral upper limbs: Secondary | ICD-10-CM

## 2014-10-20 DIAGNOSIS — G5602 Carpal tunnel syndrome, left upper limb: Secondary | ICD-10-CM

## 2014-10-20 DIAGNOSIS — G5622 Lesion of ulnar nerve, left upper limb: Secondary | ICD-10-CM

## 2014-10-20 NOTE — Assessment & Plan Note (Signed)
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 We discussed different treatment options and patient has elected try conservative therapy. I do think that there is a possibility that some of this could be coming from the cubital fossa as well. Patient is coming to try some avoiding any direct compression on this area. Patient will try a compression sleeve as well. We discussed topical anti-inflammatories and patient was given a trial we discussed some possible over-the-counter medications that might be beneficial as well. We discussed that this could all be helpful. With patient's liver disease B12 deficiency could also be contributing. Patient will start this. Patient come back again in 3 weeks. If continuing to have difficulty or numbness we'll consider an injection for diagnostic as well as hopefully therapeutic purposes.

## 2014-10-20 NOTE — Patient Instructions (Signed)
Good to meet you  Ice 20 minutes 2 times daily. Usually after activity and before bed. Exercises 3 times a week.  Alternate the elbow and the wirst Try the pennsaid twice daily Vitamin D 2000IU daily B12 1030mcg daily B6 200mg  daily Compression sleeve to elbow could help Avoid direct contact at elbow See me again in 3 weeks and we can discuss next steps but I anticipate you doing better

## 2014-10-20 NOTE — Progress Notes (Signed)
Pre visit review using our clinic review tool, if applicable. No additional management support is needed unless otherwise documented below in the visit note. 

## 2014-10-20 NOTE — Progress Notes (Signed)
[facility] [place], [place] [address] Phone: [phone] Subjective:    I'm seeing this patient by the request  of:  Scarlette Calico, MD   CC: Bilateral wrist pain left greater than right  [id number] Timothy Dickson is a 69 y.o. male coming in with complaint of bilateral wrist pain. Left greater than right. Patient states it is more of a numbness that he has in the left small finger and ring finger. Patient states that he has had this pain for quite some time. Patient is seen other providers for this. Patient did have an EMG recently. 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 Patient states that he notices it after waking up in the morning sometimes especially fevers sleeps on his stomach. Patient states it seems to be somewhat positional. Patient states sometimes he thinks there might be some mild weakness but very minimal. Denies any neck pain that is associated with it. Denies any nighttime awakening secondary to it but when he does wake up at night he does notice a numbness. Has to start moving in the feelings do come back.     Past medical history, social, surgical and family history all reviewed in electronic medical record.   Review of Systems: No headache, visual changes, nausea, vomiting, diarrhea, constipation, dizziness, abdominal pain, skin rash, fevers, chills, night sweats, weight loss, swollen lymph nodes, body aches, joint swelling, muscle aches, chest pain, shortness of breath, mood changes.   Objective Blood pressure 96/70, pulse 62, height 6' (1.829 m), weight 211 lb (95.709 kg).  General: No apparent distress alert and oriented x3 mood and affect normal, dressed appropriately.  HEENT: Pupils equal, extraocular movements intact  Respiratory: Patient's speak in full sentences and does not appear short of breath    Cardiovascular: No lower extremity edema, non tender, no erythema  Skin: Warm dry intact with no signs of infection or rash on extremities or on axial skeleton.  Abdomen: Soft nontender  Neuro: Cranial nerves II through XII are intact, neurovascularly intact in all extremities with 2+ DTRs and 2+ pulses.  Lymph: No lymphadenopathy of posterior or anterior cervical chain or axillae bilaterally.  Gait normal with good balance and coordination.  MSK:  Non tender with full range of motion and good stability and symmetric strength and tone of shoulders, elbows,  hip, knee and ankles bilaterally.  Wrist: Left Inspection normal with no visible erythema or swelling. ROM smooth and normal with good flexion and extension and ulnar/radial deviation that is symmetrical with opposite wrist. Palpation is normal over metacarpals, navicular, lunate, and TFCC; tendons without tenderness/ swelling No snuffbox tenderness. Mild tenderness over Canal of Guyon. Strength 5/5 in all directions without pain. Negative Finkelstein, tinel's and phalens. Negative Watson's test. Contralateral wrist unremarkable  MSK US performed of: Left wrist This study was ordered, performed, and interpreted by Charlann Boxer D.O.  Wrist: All extensor compartments visualized and tendons all normal in appearance without fraying, tears, or sheath effusions. No effusion seen. TFCC intact. Scapholunate ligament intact. Carpal tunnel visualized and median nerve area moderately enlarged, flexor tendons all normal in appearance without fraying, tears, or sheath effusions. Patient does have hypoechoic changes and increasing diameter of the ulnar nerve within Guyon canal Power doppler signal normal.  IMPRESSION:  Patient does have hypoechoic changes within the ulnar nerve tendon sheath  Procedure note 29528; 15 minutes  spent for Therapeutic exercises as stated in above notes.  This included exercises focusing on stretching, strengthening,  with significant focus on eccentric aspects.  The focus on patient's opening up the ulnar aspect of the wrist as well as strengthening taking pressure off the TFCC as well as Guyon's canal. Patient was instructed do 3 sets of 12-15 repetitions of multiple different strengthening and stretching activities. Proper technique shown and discussed handout in great detail with ATC.  All questions were discussed and answered.      Impression and Recommendations:     This case required medical decision making of moderate complexity.

## 2014-11-12 ENCOUNTER — Ambulatory Visit: Payer: Medicare Other | Admitting: Family Medicine

## 2014-12-10 ENCOUNTER — Ambulatory Visit: Payer: Medicare Other | Admitting: Cardiology

## 2015-01-09 DIAGNOSIS — K7460 Unspecified cirrhosis of liver: Secondary | ICD-10-CM | POA: Insufficient documentation

## 2015-01-16 ENCOUNTER — Other Ambulatory Visit: Payer: Self-pay | Admitting: *Deleted

## 2015-01-16 MED ORDER — HYDROCHLOROTHIAZIDE 25 MG PO TABS: 25.0000 mg | ORAL_TABLET | Freq: Every day | ORAL | Status: DC

## 2015-02-23 ENCOUNTER — Other Ambulatory Visit: Payer: Self-pay

## 2015-02-23 MED ORDER — FLECAINIDE ACETATE 100 MG PO TABS: 100.0000 mg | ORAL_TABLET | Freq: Two times a day (BID) | ORAL | Status: DC

## 2015-04-16 DIAGNOSIS — F1911 Other psychoactive substance abuse, in remission: Secondary | ICD-10-CM | POA: Insufficient documentation

## 2015-04-16 DIAGNOSIS — Z905 Acquired absence of kidney: Secondary | ICD-10-CM | POA: Insufficient documentation

## 2015-04-16 DIAGNOSIS — Z85528 Personal history of other malignant neoplasm of kidney: Secondary | ICD-10-CM | POA: Insufficient documentation

## 2015-06-17 ENCOUNTER — Telehealth: Payer: Self-pay

## 2015-06-17 NOTE — Telephone Encounter (Signed)
Patient called to educate on Medicare Wellness apt. LVM for the patient to call back to educate and schedule for wellness visit. Left direct to call back; Patient comes in one time a year and can schedule for next year a month in advance of CPE

## 2015-06-22 NOTE — Telephone Encounter (Signed)
Placed 2nd call for AWV and left another voice mail for call back

## 2015-08-13 ENCOUNTER — Encounter: Payer: Self-pay | Admitting: *Deleted

## 2015-08-24 ENCOUNTER — Ambulatory Visit (INDEPENDENT_AMBULATORY_CARE_PROVIDER_SITE_OTHER): Payer: Medicare Other | Admitting: Internal Medicine

## 2015-08-24 ENCOUNTER — Encounter: Payer: Self-pay | Admitting: Internal Medicine

## 2015-08-24 VITALS — BP 108/78 | HR 54 | Ht 72.0 in | Wt 205.4 lb

## 2015-08-24 DIAGNOSIS — I10 Essential (primary) hypertension: Secondary | ICD-10-CM | POA: Diagnosis not present

## 2015-08-24 DIAGNOSIS — I471 Supraventricular tachycardia: Secondary | ICD-10-CM | POA: Diagnosis not present

## 2015-08-24 MED ORDER — FLECAINIDE ACETATE 100 MG PO TABS: 50.0000 mg | ORAL_TABLET | Freq: Two times a day (BID) | ORAL | Status: DC

## 2015-08-24 NOTE — Patient Instructions (Signed)
Medication Instructions:  Your physician has recommended you make the following change in your medication:  1) Decrease Flecainide to 50 mg twice daily   Labwork: None ordered   Testing/Procedures: None ordered  Follow-Up: Your physician wants you to follow-up in: 12 months with Dr Vallery Ridge will receive a reminder letter in the mail two months in advance. If you don't receive a letter, please call our office to schedule the follow-up appointment.   Any Other Special Instructions Will Be Listed Below (If Applicable).     If you need a refill on your cardiac medications before your next appointment, please call your pharmacy.

## 2015-08-24 NOTE — Progress Notes (Signed)
Electrophysiology Office Note   Date:  08/24/2015   ID:  Timothy Dickson, DOB [birth year], MRN [id number]  PCP:  Timothy Arnt, MD  Cardiologist:  Dr Timothy Dickson Primary Electrophysiologist: Timothy Grayer, MD    Chief Complaint  Patient presents with  . SVT     History of Present Illness: Timothy Dickson is a 70 y.o. male who presents today for electrophysiology evaluation.   He has a h/o PACs, PVCs, and SVT.  He has done very well over the past year without any symptoms of arrhythmia.  He is pleased with flecainide.  He exercises regularly without exertional symptoms.  Today, he denies symptoms of exertional chest pain, shortness of breath, orthopnea, PND, lower extremity edema, claudication, dizziness, presyncope, syncope, bleeding, or neurologic sequela. The patient is tolerating medications without difficulties and is otherwise without complaint today.    Past Medical History  Diagnosis Date  . Osteoarthritis, knee   . Diverticulosis of colon     mild  . History of substance abuse   . Hyperglycemia   . History of BPH   . Hypertension   . Atrial tachycardia ([place])     documented by holter 2016  . Pneumonia 1975    hx of 35 years ago  . GERD (gastroesophageal reflux disease)   . Headache(784.0)   . Cancer of kidney ([place])     right kidney  . Cirrhosis ([place])   . Hepatitis C     s/p therapy [facility] (curative)  . CRI (chronic renal insufficiency)     s/p R nephrectomy in 1999 for malignancy   Past Surgical History  Procedure Laterality Date  . Arthroscopy, left knee,    . Left shoulder repair for bone spur  1990's  . History of liver biopsy  2007  . Transurethral needle ablation procedure    . Tuna procedure    . History of nephrectomy Right 1999  . Arthroscopy right knee      may '11 (Dr Alvan Dame)  . Knee arthroscopy Left 10/10/2012    Procedure: LEFT KNEE ARTHROSCOPY WITH MENSCIAL DEBRIDEMENT AND CHONDROPLASTY;  Surgeon: Gearlean Alf, MD;  Location: [facility];  Service:  Orthopedics;  Laterality: Left;     Current Outpatient Prescriptions  Medication Sig Dispense Refill  . b complex vitamins tablet Take 1 tablet by mouth daily.    . carvedilol (COREG) 12.5 MG tablet Take 1 tablet (12.5 mg total) by mouth 2 (two) times daily with a meal. 180 tablet 6  . flecainide (TAMBOCOR) 100 MG tablet Take 0.5 tablets (50 mg total) by mouth 2 (two) times daily. 90 tablet 3  . hydrochlorothiazide (HYDRODIURIL) 25 MG tablet Take 1 tablet (25 mg total) by mouth daily. 90 tablet 2  . TURMERIC PO Take 800 mg by mouth daily.     No current facility-administered medications for this visit.    Allergies:   Statins   Social History:  The patient  reports that he quit smoking about 38 years ago. His smoking use included Cigarettes. He has a 15 pack-year smoking history. He has never used smokeless tobacco. He reports that he does not drink alcohol or use illicit drugs.   Family History:  The patient's  family history includes Alcohol abuse in his father, other, and sister; Breast cancer in his sister; CVA in his mother; Cancer in his brother; Coronary artery disease in his mother; Diabetes in his sister; Esophageal cancer in his sister; Lung cancer in his sister; Prostate cancer in his brother.  ROS:  Please see the history of present illness.   All other systems are reviewed and negative.    PHYSICAL EXAM: VS:  BP 108/78 mmHg  Pulse 54  Ht 6' (1.829 m)  Wt 205 lb 6.4 oz (93.169 kg)  BMI 27.85 kg/m2 , BMI Body mass index is 27.85 kg/(m^2). GEN: Well nourished, well developed, in no acute distress HEENT: normal Neck: no JVD, carotid bruits, or masses Cardiac: RRR; no murmurs, rubs, or gallops,no edema  Respiratory:  clear to auscultation bilaterally, normal work of breathing GI: soft, nontender, nondistended, + BS MS: no deformity or atrophy Skin: warm and dry  Neuro:  Strength and sensation are intact Psych: euthymic mood, full affect  EKG:  EKG is ordered  today. The ekg ordered today shows sinus rhythm 54 bpm, otherwise normal ekg   Recent Labs: 08/29/2014: BUN 25*; Creatinine, Ser 1.23; Potassium 3.7; Sodium 135    Lipid Panel     Component Value Date/Time   CHOL 198 10/28/2013 1116   TRIG 138.0 10/28/2013 1116   HDL 39.80 10/28/2013 1116   CHOLHDL 5 10/28/2013 1116   VLDL 27.6 10/28/2013 1116   LDLCALC 131* 10/28/2013 1116     Wt Readings from Last 3 Encounters:  08/24/15 205 lb 6.4 oz (93.169 kg)  10/20/14 211 lb (95.709 kg)  10/13/14 210 lb 9.6 oz (95.528 kg)       ASSESSMENT AND PLAN:  1.  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  He is doing very well with coreg and flecainide. He has documented pacs and nonsustained atach which are also well controlled with flecainide. Currently, he does not wish to make any changes.  He is not interested in ablation. At this time, I will reduce flecainide to 50mg  BID. This can be uptitrated again if needed  2. HTN Stable No change required today  Return to see me in 1 year unless problems arise  Signed, Timothy Grayer, MD  08/24/2015 2:02 PM     [facility] [address] [place] [facility] [place] [address] [phone] (office) [phone] (fax)

## 2015-09-28 ENCOUNTER — Other Ambulatory Visit: Payer: Self-pay | Admitting: Cardiology

## 2015-10-04 ENCOUNTER — Other Ambulatory Visit: Payer: Self-pay | Admitting: Cardiology

## 2015-10-13 DIAGNOSIS — J209 Acute bronchitis, unspecified: Secondary | ICD-10-CM | POA: Diagnosis not present

## 2015-10-13 DIAGNOSIS — R0789 Other chest pain: Secondary | ICD-10-CM | POA: Diagnosis not present

## 2015-10-13 DIAGNOSIS — R05 Cough: Secondary | ICD-10-CM | POA: Diagnosis not present

## 2015-10-20 ENCOUNTER — Other Ambulatory Visit: Payer: Self-pay | Admitting: Cardiology

## 2015-10-27 DIAGNOSIS — M170 Bilateral primary osteoarthritis of knee: Secondary | ICD-10-CM | POA: Diagnosis not present

## 2015-10-27 DIAGNOSIS — M1711 Unilateral primary osteoarthritis, right knee: Secondary | ICD-10-CM | POA: Diagnosis not present

## 2015-11-12 DIAGNOSIS — M1712 Unilateral primary osteoarthritis, left knee: Secondary | ICD-10-CM | POA: Diagnosis not present

## 2015-11-12 DIAGNOSIS — M170 Bilateral primary osteoarthritis of knee: Secondary | ICD-10-CM | POA: Diagnosis not present

## 2015-11-23 ENCOUNTER — Other Ambulatory Visit: Payer: Self-pay | Admitting: Cardiology

## 2015-11-24 MED ORDER — CARVEDILOL 12.5 MG PO TABS: 12.5000 mg | ORAL_TABLET | Freq: Two times a day (BID) | ORAL | Status: DC

## 2015-11-24 NOTE — Telephone Encounter (Signed)
Pt walked into the office today requesting refills on medications listed.  Pt cancelled his last appt with Dr Meda Coffee for follow-up, stating that "he felt much better and felt there was no need in seeing her."  This was back in 12/10/14.  Advised the pt that I will send requested medication refills to Dr Meda Coffee to advise on, but he will need to make an appt today at our front desk, to see Dr Meda Coffee for routine follow-up.  Informed the pt that once she approves medication refills, I will follow-up with him thereafter.  Pt is at front desk complying with recommendations to schedule an appt with Dr Meda Coffee.

## 2015-12-21 ENCOUNTER — Ambulatory Visit (INDEPENDENT_AMBULATORY_CARE_PROVIDER_SITE_OTHER): Payer: Medicare Other | Admitting: Cardiology

## 2015-12-21 ENCOUNTER — Encounter: Payer: Self-pay | Admitting: Cardiology

## 2015-12-21 VITALS — BP 120/56 | HR 64 | Ht 72.0 in | Wt 203.0 lb

## 2015-12-21 DIAGNOSIS — I471 Supraventricular tachycardia: Secondary | ICD-10-CM | POA: Diagnosis not present

## 2015-12-21 DIAGNOSIS — I10 Essential (primary) hypertension: Secondary | ICD-10-CM | POA: Diagnosis not present

## 2015-12-21 DIAGNOSIS — I7121 Aneurysm of the ascending aorta, without rupture: Secondary | ICD-10-CM

## 2015-12-21 DIAGNOSIS — I712 Thoracic aortic aneurysm, without rupture: Secondary | ICD-10-CM | POA: Diagnosis not present

## 2015-12-21 DIAGNOSIS — I351 Nonrheumatic aortic (valve) insufficiency: Secondary | ICD-10-CM

## 2015-12-21 DIAGNOSIS — E785 Hyperlipidemia, unspecified: Secondary | ICD-10-CM | POA: Diagnosis not present

## 2015-12-21 HISTORY — DX: Aneurysm of the ascending aorta, without rupture: I71.21

## 2015-12-21 MED ORDER — HYDROCHLOROTHIAZIDE 25 MG PO TABS: 25.0000 mg | ORAL_TABLET | Freq: Every day | ORAL | Status: DC

## 2015-12-21 MED ORDER — CARVEDILOL 12.5 MG PO TABS: 12.5000 mg | ORAL_TABLET | Freq: Two times a day (BID) | ORAL | Status: DC

## 2015-12-21 MED ORDER — FLECAINIDE ACETATE 50 MG PO TABS: 50.0000 mg | ORAL_TABLET | Freq: Two times a day (BID) | ORAL | Status: DC

## 2015-12-21 NOTE — Patient Instructions (Signed)
Medication Instructions:   START TAKING FLECAINIDE 50 MG TWICE DAILY    Labwork:  TODAY--CMET AND NMR WITH LIPIDS    Testing/Procedures:  Your physician has requested that you have an echocardiogram. Echocardiography is a painless test that uses sound waves to create images of your heart. It provides your doctor with information about the size and shape of your heart and how well your heart's chambers and valves are working. This procedure takes approximately one hour. There are no restrictions for this procedure.    Follow-Up:  Your physician wants you to follow-up in: [place] will receive a reminder letter in the mail two months in advance. If you don't receive a letter, please call our office to schedule the follow-up appointment.        If you need a refill on your cardiac medications before your next appointment, please call your pharmacy.

## 2015-12-21 NOTE — Progress Notes (Signed)
Cardiology Office Note    Date:  12/21/2015   ID:  Timothy Dickson, DOB 07-Apr-1946, MRN [id number]  PCP:  Gara Kroner, MD  Cardiologist:  Ena Dawley, MD   CC: Follow up for SVT  History of Present Illness:  Timothy Dickson is a 70 y.o. male  with h/o substance abuse and active hepatitis C, recently cured, hyperlipidemia, with PACs, PVCs, and SVT, that occurs approximately twice a year. His SVT is symptomatic. He is being followed by Dr. Rayann Heman but he is not interested in ablation yet, he was recently started on flecainide 100 mg by mouth twice a day was followed by exercise treadmill stress test that was normal. Patient was advised to decrease the flecainide dose to 50 mg by mouth twice a day that he do that he had no palpitations ever since, he would like to be decreased officially to that dose. He denies any chest pain, shortness of breath, dyspnea on exertion he is an avid biker and has no symptoms. He is no claudications or syncope.  Past Medical History  Diagnosis Date  . Osteoarthritis, knee   . Diverticulosis of colon     mild  . History of substance abuse   . Hyperglycemia   . History of BPH   . Hypertension   . Atrial tachycardia ([place])     documented by holter 2016  . Pneumonia 1975    hx of 35 years ago  . GERD (gastroesophageal reflux disease)   . Headache(784.0)   . 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 Cirrhosis ([place])   . Hepatitis C     s/p therapy [facility] (curative)  . CRI (chronic renal insufficiency)     s/p R nephrectomy in 1999 for malignancy   Past Surgical History  Procedure Laterality Date  . Arthroscopy, left knee,    . Left shoulder repair for bone spur  1990's  . History of liver biopsy  2007  . Transurethral needle ablation procedure    . Tuna procedure    . History of nephrectomy Right 1999  . Arthroscopy right knee      may '11 (Dr Alvan Dame)  . Knee arthroscopy Left 10/10/2012    Procedure: LEFT KNEE ARTHROSCOPY WITH MENSCIAL DEBRIDEMENT  AND CHONDROPLASTY;  Surgeon: Gearlean Alf, MD;  Location: [facility];  Service: Orthopedics;  Laterality: Left;   Current Medications: Outpatient Prescriptions Prior to Visit  Medication Sig Dispense Refill  . b complex vitamins tablet Take 1 tablet by mouth daily.    . carvedilol (COREG) 12.5 MG tablet Take 1 tablet (12.5 mg total) by mouth 2 (two) times daily with a meal. 180 tablet 0  . hydrochlorothiazide (HYDRODIURIL) 25 MG tablet TAKE 1 TABLET ONCE DAILY. 30 tablet 0  . flecainide (TAMBOCOR) 100 MG tablet TAKE 1 TABLET TWICE DAILY. (Patient not taking: Reported on 12/21/2015) 180 tablet 3  . TURMERIC PO Take 800 mg by mouth daily.     No facility-administered medications prior to visit.    Allergies:   Statins   Social History   Social History  . Marital Status: Married    Spouse Name: N/A  . Number of Children: N/A  . Years of Education: N/A   Social History Main Topics  . Smoking status: Former Smoker -- 1.00 packs/day for 15 years    Types: Cigarettes    Quit date: 08/01/1977  . Smokeless tobacco: Never Used  . Alcohol Use: No  . Drug Use: No  .  Sexual Activity:    Partners: Female   Other Topics Concern  . None   Social History Narrative   [place] [employer], [place], [place] psych. Married- '84. 2 step-daughters, 4 grandchildren. Work  Retired Radio producer  Marriage in good health.       Family History:  The patient's family history includes Alcohol abuse in his father, other, and sister; Breast cancer in his sister; CVA in his mother; Cancer in his brother; Coronary artery disease in his mother; Diabetes in his sister; Esophageal cancer in his sister; Lung cancer in his sister; Prostate cancer in his brother.   ROS:   Please see the history of present illness.    ROS All other systems reviewed and are negative.  PHYSICAL EXAM:   VS:  BP 120/56 mmHg  Pulse 64  Ht 6' (1.829 m)  Wt 203 lb (92.08 kg)  BMI 27.53 kg/m2   GEN: Well nourished, well  developed, in no acute distress HEENT: normal Neck: no JVD, carotid bruits, or masses Cardiac: RRR; no murmurs, rubs, or gallops,no edema  Respiratory:  clear to auscultation bilaterally, normal work of breathing GI: soft, nontender, nondistended, + BS MS: no deformity or atrophy Skin: warm and dry, no rash Neuro:  Alert and Oriented x 3, Strength and sensation are intact Psych: euthymic mood, full affect  Wt Readings from Last 3 Encounters:  12/21/15 203 lb (92.08 kg)  08/24/15 205 lb 6.4 oz (93.169 kg)  10/20/14 211 lb (95.709 kg)    Studies/Labs Reviewed:   EKG:  EKG is ordered today.  The ekg ordered today demonstrates Sinus rhythm with sinus arrhythmia otherwise normal EKG unchanged from prior.  Recent Labs: No results found for requested labs within last 365 days.   Lipid Panel    Component Value Date/Time   CHOL 198 10/28/2013 1116   TRIG 138.0 10/28/2013 1116   HDL 39.80 10/28/2013 1116   CHOLHDL 5 10/28/2013 1116   VLDL 27.6 10/28/2013 1116   LDLCALC 131* 10/28/2013 1116   Additional studies/ records that were reviewed today include:  TTE: 7/142015  - Left ventricle: The cavity size was normal. There was mild concentric hypertrophy. Systolic function was normal. The estimated ejection fraction was in the range of 60% to 65%. There is akinesis of the basalinferior myocardium. Doppler parameters are consistent with abnormal left ventricular relaxation (grade 1 diastolic dysfunction). - Aortic valve: There was trivial regurgitation. - Aorta: Aortic root dimension: 41 mm (ED). - Ascending aorta: The ascending aorta was mildly dilated.     ASSESSMENT:    1. Essential hypertension, benign   2. Paroxysmal supraventricular tachycardia ([place])   3. SVT (supraventricular tachycardia) ([facility])   4. AI (aortic insufficiency)   5. Hyperlipidemia   6. Ascending aortic aneurysm ([facility])      PLAN:  In order of problems listed above:  1. SVT no episodes on  lichen at 50 by mouth twice a day, will continue this dose, EKG shows normal intervals.  2. Hypertension - well controlled on current regimen  3. Carotid disease, in 1-39% range bilaterally for years ago, no reason to repeat unless patient said symptoms.  4. Hyperlipidemia - 131 when checked 2 years ago, however patient had active hepatitis C and has liver cirrhosis, he was advised never to use statins. We will repeat his lipids and if elevated we will consider starting him on Zetia. The patient is asking multiple very appropriate questions and is very concerned about his prognosis if  his lipids are borderline we will order a calcium score scan for his prognosis and guidance of aggressiveness of his lipid therapy.  5. Ascending aortic aneurysm, trivial AI on echocardiogram 2 years ago we will repeat to reassess stability of his aneurysm. (aortic root 41 mm, ascending aorta 40 mm on echo in 01/2014).    Medication Adjustments/Labs and Tests Ordered: Current medicines are reviewed at length with the patient today.  Concerns regarding medicines are outlined above.  Medication changes, Labs and Tests ordered today are listed in the Patient Instructions below. Patient Instructions  Medication Instructions:   START TAKING FLECAINIDE 50 MG TWICE DAILY    Labwork:  TODAY--CMET AND NMR WITH LIPIDS    Testing/Procedures:  Your physician has requested that you have an echocardiogram. Echocardiography is a painless test that uses sound waves to create images of your heart. It provides your doctor with information about the size and shape of your heart and how well your heart's chambers and valves are working. This procedure takes approximately one hour. There are no restrictions for this procedure.    Follow-Up:  Your physician wants you to follow-up in: [place] will receive a reminder letter in the mail two months in advance. If you don't receive a letter, please call our  office to schedule the follow-up appointment.        If you need a refill on your cardiac medications before your next appointment, please call your pharmacy.       Signed, Ena Dawley, MD  12/21/2015 11:18 AM    [facility] [address], [place], [place]  [address] Phone: [phone]; Fax: [phone]

## 2015-12-22 LAB — COMPREHENSIVE METABOLIC PANEL
ALT: 19 U/L (ref 9–46)
AST: 30 U/L (ref 10–35)
Albumin: 4.8 g/dL (ref 3.6–5.1)
Alkaline Phosphatase: 46 U/L (ref 40–115)
BUN: 21 mg/dL (ref 7–25)
CO2: 27 mmol/L (ref 20–31)
Calcium: 9.8 mg/dL (ref 8.6–10.3)
Chloride: 97 mmol/L — ABNORMAL LOW (ref 98–110)
Creat: 1.16 mg/dL (ref 0.70–1.18)
Glucose, Bld: 83 mg/dL (ref 65–99)
Potassium: 3.9 mmol/L (ref 3.5–5.3)
Sodium: 137 mmol/L (ref 135–146)
Total Bilirubin: 0.7 mg/dL (ref 0.2–1.2)
Total Protein: 7.1 g/dL (ref 6.1–8.1)

## 2015-12-23 DIAGNOSIS — M170 Bilateral primary osteoarthritis of knee: Secondary | ICD-10-CM | POA: Diagnosis not present

## 2015-12-23 DIAGNOSIS — M1712 Unilateral primary osteoarthritis, left knee: Secondary | ICD-10-CM | POA: Diagnosis not present

## 2015-12-23 DIAGNOSIS — M1711 Unilateral primary osteoarthritis, right knee: Secondary | ICD-10-CM | POA: Diagnosis not present

## 2015-12-25 LAB — CARDIO IQ(R) ADVANCED LIPID PANEL
Apolipoprotein B: 101 mg/dL (ref 52–109)
Cholesterol, Total: 206 mg/dL — ABNORMAL HIGH (ref 125–200)
Cholesterol/HDL Ratio: 4.6 calc (ref <=5.0)
HDL Cholesterol: 45 mg/dL (ref >=40)
LDL Large: 5326 nmol/L (ref 4334–10815)
LDL Medium: 387 nmol/L (ref 167–465)
LDL Particle Number: 1513 nmol/L (ref 1016–2185)
LDL Peak Size: 217.4 Angstrom — ABNORMAL LOW (ref >=218.2)
LDL Small: 309 nmol/L (ref 123–441)
LDL, Calculated: 129 mg/dL
Lipoprotein (a): 29 nmol/L (ref <75)
Non-HDL Cholesterol: 161 mg/dL
Triglycerides: 161 mg/dL — ABNORMAL HIGH

## 2015-12-29 DIAGNOSIS — M170 Bilateral primary osteoarthritis of knee: Secondary | ICD-10-CM | POA: Diagnosis not present

## 2015-12-29 DIAGNOSIS — M1712 Unilateral primary osteoarthritis, left knee: Secondary | ICD-10-CM | POA: Diagnosis not present

## 2015-12-29 DIAGNOSIS — M1711 Unilateral primary osteoarthritis, right knee: Secondary | ICD-10-CM | POA: Diagnosis not present

## 2015-12-29 DIAGNOSIS — K7460 Unspecified cirrhosis of liver: Secondary | ICD-10-CM | POA: Diagnosis not present

## 2015-12-31 ENCOUNTER — Telehealth: Payer: Self-pay | Admitting: *Deleted

## 2015-12-31 DIAGNOSIS — E785 Hyperlipidemia, unspecified: Secondary | ICD-10-CM

## 2015-12-31 DIAGNOSIS — I712 Thoracic aortic aneurysm, without rupture: Secondary | ICD-10-CM

## 2015-12-31 DIAGNOSIS — I7121 Aneurysm of the ascending aorta, without rupture: Secondary | ICD-10-CM

## 2015-12-31 NOTE — Telephone Encounter (Signed)
Left a message for the pt to call back to go over Dr Francesca Oman recommendations for the pt, based on his NMR with lipid results and concerns the pt had with starting Zetia and having a history of Hep C and cirrhosis of the liver.

## 2016-01-05 ENCOUNTER — Other Ambulatory Visit: Payer: Self-pay

## 2016-01-05 ENCOUNTER — Ambulatory Visit (HOSPITAL_COMMUNITY): Payer: Medicare Other | Attending: Cardiology

## 2016-01-05 DIAGNOSIS — E119 Type 2 diabetes mellitus without complications: Secondary | ICD-10-CM | POA: Insufficient documentation

## 2016-01-05 DIAGNOSIS — I340 Nonrheumatic mitral (valve) insufficiency: Secondary | ICD-10-CM | POA: Diagnosis not present

## 2016-01-05 DIAGNOSIS — I351 Nonrheumatic aortic (valve) insufficiency: Secondary | ICD-10-CM | POA: Diagnosis not present

## 2016-01-05 DIAGNOSIS — Z8249 Family history of ischemic heart disease and other diseases of the circulatory system: Secondary | ICD-10-CM | POA: Insufficient documentation

## 2016-01-05 DIAGNOSIS — I471 Supraventricular tachycardia: Secondary | ICD-10-CM | POA: Diagnosis not present

## 2016-01-05 DIAGNOSIS — I10 Essential (primary) hypertension: Secondary | ICD-10-CM

## 2016-01-05 DIAGNOSIS — E785 Hyperlipidemia, unspecified: Secondary | ICD-10-CM | POA: Diagnosis not present

## 2016-01-05 DIAGNOSIS — I77810 Thoracic aortic ectasia: Secondary | ICD-10-CM | POA: Insufficient documentation

## 2016-01-05 DIAGNOSIS — R29898 Other symptoms and signs involving the musculoskeletal system: Secondary | ICD-10-CM | POA: Insufficient documentation

## 2016-01-05 DIAGNOSIS — I7121 Aneurysm of the ascending aorta, without rupture: Secondary | ICD-10-CM

## 2016-01-05 DIAGNOSIS — Z87891 Personal history of nicotine dependence: Secondary | ICD-10-CM | POA: Diagnosis not present

## 2016-01-05 DIAGNOSIS — I371 Nonrheumatic pulmonary valve insufficiency: Secondary | ICD-10-CM | POA: Diagnosis not present

## 2016-01-05 DIAGNOSIS — I712 Thoracic aortic aneurysm, without rupture: Secondary | ICD-10-CM

## 2016-01-05 NOTE — Telephone Encounter (Signed)
Pt made aware of NMR with lipid results and Dr Francesca Oman recommendations, at yesterday's OV with her.  Pt verbalized understanding and agreed with plan presented.

## 2016-01-06 NOTE — Addendum Note (Signed)
Addended by: Nuala Alpha on: 01/06/2016 04:23 PM   Modules accepted: Orders

## 2016-01-06 NOTE — Telephone Encounter (Signed)
When notifying the pt with his echo results, he mentioned that he would like to proceed with Dr Francesca Oman recommendations and have a regular coronary calcium score CT, before being started on any kind of statin.  Informed the pt that we do this test in the office.  Informed the pt that I will place the order in the system, and have our [facility] schedulers call him back with this appt.  Pt verbalized understanding and agrees with this plan.   Notes Recorded by Katrine Coho, RN on 12/30/2015 at 7:31 AM I spoke with Dr Meda Coffee 12/29/15 about patient concerns about starting Zetia.  Dr Meda Coffee was going to call the patient and discuss with him.   Notes Recorded by Katrine Coho, RN on 12/29/2015 at 11:31 AM I spoke with patient about lab results and Dr Francesca Oman recommendation to start Zetia.  Pt states he is concerned about effects Zetia may have on his liver with his history of active Hepatitis C and liver cirrhosis. Pt states he is hesitant about starting Zetia if there is any chance of risk to his liver.    Pt is also asking if Dr Meda Coffee is recommending coronary artery calcium score CT based on these lab results.  Pt advised I will review with Dr Meda Coffee. Notes Recorded by Michaelyn Barter, RN on 12/29/2015 at 10:56 AM Left message for patient to call back. Notes Recorded by Dorothy Spark, MD on 12/28/2015 at 9:57 AM Elevated LDL (129) and triglycerides (161), I would start Zetia 10 mg po daily. Notes Recorded by Nuala Alpha, LPN on [id number] at 579FGE AM Left a message for the pt to call back to endorse normal cmet with lipids still pending per Dr Meda Coffee. Notes Recorded by Dorothy Spark, MD on 12/22/2015 at 8:48 AM Normal CMP, lipids are pending

## 2016-01-07 DIAGNOSIS — M1712 Unilateral primary osteoarthritis, left knee: Secondary | ICD-10-CM | POA: Diagnosis not present

## 2016-01-07 DIAGNOSIS — M1711 Unilateral primary osteoarthritis, right knee: Secondary | ICD-10-CM | POA: Diagnosis not present

## 2016-01-07 DIAGNOSIS — M170 Bilateral primary osteoarthritis of knee: Secondary | ICD-10-CM | POA: Diagnosis not present

## 2016-01-14 ENCOUNTER — Ambulatory Visit (INDEPENDENT_AMBULATORY_CARE_PROVIDER_SITE_OTHER)
Admission: RE | Admit: 2016-01-14 | Discharge: 2016-01-14 | Disposition: A | Discharge status: Home / Self Care | Payer: Self-pay | Source: Ambulatory Visit | Attending: Cardiology | Admitting: Cardiology

## 2016-01-14 DIAGNOSIS — E785 Hyperlipidemia, unspecified: Secondary | ICD-10-CM

## 2016-01-14 DIAGNOSIS — I712 Thoracic aortic aneurysm, without rupture: Secondary | ICD-10-CM

## 2016-01-14 DIAGNOSIS — I7121 Aneurysm of the ascending aorta, without rupture: Secondary | ICD-10-CM

## 2016-01-15 ENCOUNTER — Telehealth: Payer: Self-pay | Admitting: Cardiology

## 2016-01-15 ENCOUNTER — Other Ambulatory Visit: Payer: Self-pay | Admitting: *Deleted

## 2016-01-15 DIAGNOSIS — I77810 Thoracic aortic ectasia: Secondary | ICD-10-CM

## 2016-01-15 DIAGNOSIS — E785 Hyperlipidemia, unspecified: Secondary | ICD-10-CM

## 2016-01-15 DIAGNOSIS — Z79899 Other long term (current) drug therapy: Secondary | ICD-10-CM

## 2016-01-15 MED ORDER — EZETIMIBE 10 MG PO TABS: 10.0000 mg | ORAL_TABLET | Freq: Every day | ORAL | Status: DC

## 2016-01-15 NOTE — Telephone Encounter (Signed)
PT  AWARE OF  CALCIUM  SCORE  RESULTS  SEE RESULTS  NOTE  ON CALCIUM SCORE .Timothy Dickson

## 2016-01-15 NOTE — Telephone Encounter (Signed)
Follow-up    The pt is returning the nurses call from yesterday to go over test results and get recommendations

## 2016-01-22 ENCOUNTER — Other Ambulatory Visit: Payer: Medicare Other

## 2016-01-27 ENCOUNTER — Ambulatory Visit (HOSPITAL_COMMUNITY): Payer: Medicare Other

## 2016-02-08 ENCOUNTER — Other Ambulatory Visit (INDEPENDENT_AMBULATORY_CARE_PROVIDER_SITE_OTHER): Payer: Medicare Other | Admitting: *Deleted

## 2016-02-08 DIAGNOSIS — Z79899 Other long term (current) drug therapy: Secondary | ICD-10-CM | POA: Diagnosis not present

## 2016-02-08 DIAGNOSIS — E785 Hyperlipidemia, unspecified: Secondary | ICD-10-CM | POA: Diagnosis not present

## 2016-02-08 LAB — LIPID PANEL
Cholesterol: 132 mg/dL (ref 125–200)
HDL: 41 mg/dL (ref >=40)
LDL Cholesterol: 67 mg/dL (ref <130)
Total CHOL/HDL Ratio: 3.2 Ratio (ref <=5.0)
Triglycerides: 119 mg/dL (ref <150)
VLDL: 24 mg/dL (ref <30)

## 2016-02-08 LAB — HEPATIC FUNCTION PANEL
ALT: 18 U/L (ref 9–46)
AST: 29 U/L (ref 10–35)
Albumin: 4.6 g/dL (ref 3.6–5.1)
Alkaline Phosphatase: 44 U/L (ref 40–115)
Bilirubin, Direct: 0.2 mg/dL (ref <=0.2)
Indirect Bilirubin: 0.6 mg/dL (ref 0.2–1.2)
Total Bilirubin: 0.8 mg/dL (ref 0.2–1.2)
Total Protein: 6.5 g/dL (ref 6.1–8.1)

## 2016-02-08 NOTE — Addendum Note (Signed)
Addended by: Eulis Foster on: 02/08/2016 11:21 AM   Modules accepted: Orders

## 2016-02-10 ENCOUNTER — Telehealth: Payer: Self-pay | Admitting: *Deleted

## 2016-02-10 ENCOUNTER — Ambulatory Visit (HOSPITAL_COMMUNITY)
Admission: RE | Admit: 2016-02-10 | Discharge: 2016-02-10 | Disposition: A | Discharge status: Home / Self Care | Payer: Medicare Other | Source: Ambulatory Visit | Attending: Cardiology | Admitting: Cardiology

## 2016-02-10 DIAGNOSIS — I77810 Thoracic aortic ectasia: Secondary | ICD-10-CM

## 2016-02-10 DIAGNOSIS — I712 Thoracic aortic aneurysm, without rupture: Secondary | ICD-10-CM

## 2016-02-10 DIAGNOSIS — I7121 Aneurysm of the ascending aorta, without rupture: Secondary | ICD-10-CM

## 2016-02-10 LAB — CREATININE, SERUM
Creatinine, Ser: 1.26 mg/dL — ABNORMAL HIGH (ref 0.61–1.24)
GFR calc Af Amer: >60 mL/min (ref >60)
GFR calc non Af Amer: 56 mL/min — ABNORMAL LOW (ref >60)

## 2016-02-10 MED ORDER — GADOBENATE DIMEGLUMINE 529 MG/ML IV SOLN
20.0000 mL | Freq: Once | INTRAVENOUS | Status: AC | PRN
  Administered 2016-02-10: 20 mL via INTRAVENOUS

## 2016-02-10 NOTE — Telephone Encounter (Signed)
Notified the pt that per Dr Meda Coffee, his MRA of the chest showed that his ascending aorta is stable in size, which is great news.  Informed the pt that per Dr Meda Coffee, we will just follow this in 1 year.  Informed the pt that I will place the order in the system and have someone from [facility] scheduling call him back to arrange this for one year out.  Pt verbalized understanding and agrees with this plan.

## 2016-02-10 NOTE — Telephone Encounter (Signed)
-----   Message from Dorothy Spark, MD sent at 02/10/2016  2:00 PM EDT ----- His ascending aorta has stable size, that's a great news!  We will have to follow in 1 year.

## 2016-02-17 ENCOUNTER — Ambulatory Visit (INDEPENDENT_AMBULATORY_CARE_PROVIDER_SITE_OTHER): Payer: Medicare Other | Admitting: Urgent Care

## 2016-02-17 VITALS — BP 100/70 | HR 72 | Temp 98.6°F | Resp 18 | Ht 71.5 in | Wt 206.4 lb

## 2016-02-17 DIAGNOSIS — L03032 Cellulitis of left toe: Secondary | ICD-10-CM

## 2016-02-17 MED ORDER — CEPHALEXIN 500 MG PO CAPS: 500.0000 mg | ORAL_CAPSULE | Freq: Three times a day (TID) | ORAL | Status: DC

## 2016-02-17 NOTE — Patient Instructions (Addendum)
Cellulitis Cellulitis is an infection of the skin and the tissue beneath it. The infected area is usually red and tender. Cellulitis occurs most often in the arms and lower legs.  CAUSES  Cellulitis is caused by bacteria that enter the skin through cracks or cuts in the skin. The most common types of bacteria that cause cellulitis are staphylococci and streptococci. SIGNS AND SYMPTOMS   Redness and warmth.  Swelling.  Tenderness or pain.  Fever. DIAGNOSIS  Your health care provider can usually determine what is wrong based on a physical exam. Blood tests may also be done. TREATMENT  Treatment usually involves taking an antibiotic medicine. HOME CARE INSTRUCTIONS   Take your antibiotic medicine as directed by your health care provider. Finish the antibiotic even if you start to feel better.  Keep the infected arm or leg elevated to reduce swelling.  Apply a warm cloth to the affected area up to 4 times per day to relieve pain.  Take medicines only as directed by your health care provider.  Keep all follow-up visits as directed by your health care provider. SEEK MEDICAL CARE IF:   You notice red streaks coming from the infected area.  Your red area gets larger or turns dark in color.  Your bone or joint underneath the infected area becomes painful after the skin has healed.  Your infection returns in the same area or another area.  You notice a swollen bump in the infected area.  You develop new symptoms.  You have a fever. SEEK IMMEDIATE MEDICAL CARE IF:   You feel very sleepy.  You develop vomiting or diarrhea.  You have a general ill feeling (malaise) with muscle aches and pains.   This information is not intended to replace advice given to you by your health care provider. Make sure you discuss any questions you have with your health care provider.   Document Released: 04/27/2005 Document Revised: 04/08/2015 Document Reviewed: 10/03/2011 Elsevier Interactive  Patient Education 2016 [employer].     IF you received an x-ray today, you will receive an invoice from [facility]. Please contact [facility] at [phone] with questions or concerns regarding your invoice.   IF you received labwork today, you will receive an invoice from [employer]. Please contact [facility] at [phone] with questions or concerns regarding your invoice.   Our billing staff will not be able to assist you with questions regarding bills from these companies.  You will be contacted with the lab results as soon as they are available. The fastest way to get your results is to activate your My Chart account. Instructions are located on the last page of this paperwork. If you have not heard from [place] regarding the results in 2 weeks, please contact this office.

## 2016-02-17 NOTE — Progress Notes (Signed)
    MRN: [id number] DOB: [birth year]  Subjective:   Timothy Dickson is a 70 y.o. male presenting for chief complaint of Nail Problem  Reports 3 day history of swollen, painful left middle toe. Also has redness and feels warm. Has tried hydrocortisone with no relief. Denies fever, numbness, trauma, drainage of pus or bleeding. Denies history of gout.  Timothy Dickson has a current medication list which includes the following prescription(s): vitamin c, b complex vitamins, carvedilol, ezetimibe, flecainide, hydrochlorothiazide, and fish oil. Also is allergic to statins.  Timothy Dickson  has a past medical history of Osteoarthritis, knee; Diverticulosis of colon; History of substance abuse; Hyperglycemia; History of BPH; Hypertension; Atrial tachycardia ([place]); Pneumonia (1975); GERD (gastroesophageal reflux disease); Headache(784.0); Cancer of kidney ([place]); Cirrhosis ([place]); Hepatitis C; and CRI (chronic renal insufficiency). Also  has past surgical history that includes Arthroscopy, left knee,; left shoulder repair for bone spur (1990's); history of liver biopsy (2007); Transurethral needle ablation procedure; TUNA procedure; History of Nephrectomy (Right, 1999); Arthroscopy right knee; and Knee arthroscopy (Left, 10/10/2012).  Objective:   Vitals: BP 100/70 mmHg  Pulse 72  Temp(Src) 98.6 F (37 C) (Oral)  Resp 18  Ht 5' 11.5" (1.816 m)  Wt 206 lb 6.4 oz (93.622 kg)  BMI 28.39 kg/m2  SpO2 95%  Physical Exam  Constitutional: He is oriented to person, place, and time. He appears well-developed and well-nourished.  Cardiovascular: Normal rate.   Pulmonary/Chest: Effort normal.  Musculoskeletal:       Left foot: There is tenderness (over area of erythema) and swelling (over erythema but no areas of fluctuance). There is normal range of motion, no bony tenderness, normal capillary refill, no crepitus, no deformity and no laceration.  Neurological: He is alert and oriented to person, place, and time.  Skin: Skin is  warm and dry. There is erythema (non-blanching erythema over left middle toe as depicted).      Assessment and Plan :   1. Cellulitis of toe of left foot - Patient prefers to hold off on incision and drainage especially since there was no area of fluctuance. He will start Keflex to cover for cellulitis. RTC in 2 days if no improvement.  Timothy Eagles, PA-C [facility] [phone] 02/17/2016 5:57 PM

## 2016-02-19 ENCOUNTER — Ambulatory Visit (HOSPITAL_COMMUNITY): Payer: Medicare Other

## 2016-02-19 DIAGNOSIS — Z85528 Personal history of other malignant neoplasm of kidney: Secondary | ICD-10-CM | POA: Diagnosis not present

## 2016-03-01 DIAGNOSIS — M1711 Unilateral primary osteoarthritis, right knee: Secondary | ICD-10-CM | POA: Diagnosis not present

## 2016-03-01 DIAGNOSIS — M170 Bilateral primary osteoarthritis of knee: Secondary | ICD-10-CM | POA: Diagnosis not present

## 2016-03-16 ENCOUNTER — Other Ambulatory Visit: Payer: Medicare Other | Admitting: *Deleted

## 2016-03-16 DIAGNOSIS — I10 Essential (primary) hypertension: Secondary | ICD-10-CM

## 2016-03-16 DIAGNOSIS — E785 Hyperlipidemia, unspecified: Secondary | ICD-10-CM | POA: Diagnosis not present

## 2016-03-16 LAB — LIPID PANEL
Cholesterol: 150 mg/dL (ref 125–200)
HDL: 57 mg/dL (ref >=40)
LDL Cholesterol: 79 mg/dL (ref <130)
Total CHOL/HDL Ratio: 2.6 Ratio (ref <=5.0)
Triglycerides: 70 mg/dL (ref <150)
VLDL: 14 mg/dL (ref <30)

## 2016-03-16 LAB — HEPATIC FUNCTION PANEL
ALT: 18 U/L (ref 9–46)
AST: 27 U/L (ref 10–35)
Albumin: 4.6 g/dL (ref 3.6–5.1)
Alkaline Phosphatase: 45 U/L (ref 40–115)
Bilirubin, Direct: 0.2 mg/dL (ref <=0.2)
Indirect Bilirubin: 0.6 mg/dL (ref 0.2–1.2)
Total Bilirubin: 0.8 mg/dL (ref 0.2–1.2)
Total Protein: 6.8 g/dL (ref 6.1–8.1)

## 2016-03-16 NOTE — Addendum Note (Signed)
Addended by: Eulis Foster on: 03/16/2016 10:55 AM   Modules accepted: Orders

## 2016-03-23 DIAGNOSIS — M25562 Pain in left knee: Secondary | ICD-10-CM | POA: Diagnosis not present

## 2016-03-23 DIAGNOSIS — M170 Bilateral primary osteoarthritis of knee: Secondary | ICD-10-CM | POA: Diagnosis not present

## 2016-05-12 ENCOUNTER — Ambulatory Visit: Payer: Self-pay | Admitting: Orthopedic Surgery

## 2016-05-12 DIAGNOSIS — M1711 Unilateral primary osteoarthritis, right knee: Secondary | ICD-10-CM | POA: Diagnosis not present

## 2016-05-12 DIAGNOSIS — I480 Paroxysmal atrial fibrillation: Secondary | ICD-10-CM | POA: Diagnosis not present

## 2016-05-12 DIAGNOSIS — Z0000 Encounter for general adult medical examination without abnormal findings: Secondary | ICD-10-CM | POA: Diagnosis not present

## 2016-05-12 DIAGNOSIS — Z125 Encounter for screening for malignant neoplasm of prostate: Secondary | ICD-10-CM | POA: Diagnosis not present

## 2016-05-12 DIAGNOSIS — I10 Essential (primary) hypertension: Secondary | ICD-10-CM | POA: Diagnosis not present

## 2016-05-12 DIAGNOSIS — E7800 Pure hypercholesterolemia, unspecified: Secondary | ICD-10-CM | POA: Diagnosis not present

## 2016-05-12 DIAGNOSIS — E119 Type 2 diabetes mellitus without complications: Secondary | ICD-10-CM | POA: Diagnosis not present

## 2016-05-12 DIAGNOSIS — Z8619 Personal history of other infectious and parasitic diseases: Secondary | ICD-10-CM | POA: Diagnosis not present

## 2016-05-12 DIAGNOSIS — Z1211 Encounter for screening for malignant neoplasm of colon: Secondary | ICD-10-CM | POA: Diagnosis not present

## 2016-05-12 DIAGNOSIS — Z85528 Personal history of other malignant neoplasm of kidney: Secondary | ICD-10-CM | POA: Diagnosis not present

## 2016-05-13 DIAGNOSIS — M1711 Unilateral primary osteoarthritis, right knee: Secondary | ICD-10-CM | POA: Diagnosis not present

## 2016-05-17 ENCOUNTER — Telehealth: Payer: Self-pay | Admitting: Cardiology

## 2016-05-17 ENCOUNTER — Other Ambulatory Visit: Payer: Self-pay | Admitting: *Deleted

## 2016-05-17 DIAGNOSIS — I7121 Aneurysm of the ascending aorta, without rupture: Secondary | ICD-10-CM

## 2016-05-17 DIAGNOSIS — I712 Thoracic aortic aneurysm, without rupture: Secondary | ICD-10-CM

## 2016-05-17 DIAGNOSIS — I10 Essential (primary) hypertension: Secondary | ICD-10-CM

## 2016-05-17 DIAGNOSIS — I471 Supraventricular tachycardia: Secondary | ICD-10-CM

## 2016-05-17 NOTE — Telephone Encounter (Signed)
First I called the pharmacy to verify the rx is in their system. Per associate insurance will not let patient refill medication because it is 38 days too soon. I called patient to verify dosing and he states a while back when he seen Dr. Rayann Heman it was said that if he starts to have skipped beats it would be ok to take an extra 50mg  of Flecainide. Patient was been taking 50mg  three times a day and wants Dr. Meda Coffee to change his rx to say 50mg  three times day so he can get an appropriate refill.  I let the patient know I will first have to get permission to to refill his medication at Flecainide 50mg  three times daily instead of twice daily.  Please advise.

## 2016-05-17 NOTE — Telephone Encounter (Signed)
New message    Patient calling due to taken an extra pill   *STAT* If patient is at the pharmacy, call can be transferred to refill team.   1. Which medications need to be refilled? (please list name of each medication and dose if known) flecainde 100 mg per day   2. Which pharmacy/location (including street and city if local pharmacy) is medication to be sent to? [place] [facility]   3. Do they need a 30 day or 90 day supply? 90 days

## 2016-05-17 NOTE — Telephone Encounter (Signed)
Please change the prescription

## 2016-05-18 NOTE — Telephone Encounter (Signed)
Left a message for the pt to call back to endorse Dr Francesca Oman recommendations, and give the ok to change his current dose of Flecainide.

## 2016-05-19 MED ORDER — FLECAINIDE ACETATE 50 MG PO TABS: 50.0000 mg | ORAL_TABLET | Freq: Three times a day (TID) | ORAL | 2 refills | Status: DC

## 2016-05-19 NOTE — Telephone Encounter (Signed)
Follow Up:    Pt says he is fine with increasing his Flecainide,please call in the prescription.

## 2016-05-19 NOTE — Telephone Encounter (Signed)
Sent pts increased dose of Flecainide 50 mg po TID to his confirmed pharmacy of choice.  Increase in this med was given by Dr Meda Coffee, as indicated in this note.  Pt just wants this called in, and needs no return call back.

## 2016-06-20 DIAGNOSIS — H5203 Hypermetropia, bilateral: Secondary | ICD-10-CM | POA: Diagnosis not present

## 2016-06-27 DIAGNOSIS — C220 Liver cell carcinoma: Secondary | ICD-10-CM | POA: Diagnosis not present

## 2016-06-27 DIAGNOSIS — K7460 Unspecified cirrhosis of liver: Secondary | ICD-10-CM | POA: Diagnosis not present

## 2016-07-13 DIAGNOSIS — Z85828 Personal history of other malignant neoplasm of skin: Secondary | ICD-10-CM | POA: Diagnosis not present

## 2016-07-13 DIAGNOSIS — L821 Other seborrheic keratosis: Secondary | ICD-10-CM | POA: Diagnosis not present

## 2016-07-13 DIAGNOSIS — L570 Actinic keratosis: Secondary | ICD-10-CM | POA: Diagnosis not present

## 2016-07-13 DIAGNOSIS — Z23 Encounter for immunization: Secondary | ICD-10-CM | POA: Diagnosis not present

## 2016-07-13 DIAGNOSIS — L853 Xerosis cutis: Secondary | ICD-10-CM | POA: Diagnosis not present

## 2016-07-21 ENCOUNTER — Ambulatory Visit: Payer: Medicare Other | Admitting: Cardiology

## 2016-07-21 ENCOUNTER — Ambulatory Visit: Payer: Self-pay | Admitting: Orthopedic Surgery

## 2016-07-21 NOTE — Progress Notes (Signed)
Pre-surgical testing  Please note that a new consent has been ordered to include a left knee cortisone injection to be performed along with the right total knee arthroplasty on 08/15/2016 by Dr. Wynelle Link. Thanks  Arlee Muslim, PA-C

## 2016-07-28 ENCOUNTER — Telehealth: Payer: Self-pay | Admitting: Cardiology

## 2016-07-28 NOTE — Telephone Encounter (Signed)
Spoke with the pt and informed him that Dr Meda Coffee cleared him for his orthopedic Knee Surgery back on 05/05/16.  Informed the pt that we faxed this back to [facility] on 05/05/16 at 2:55 pm, with confirmation they received this clearance.  Advised the pt to make contact with his Ortho Office to inform them of this, and to contact us back if this needs to be re-faxed.  Pt verbalized understanding, agrees with this plan, and gracious for all the assistance provided.

## 2016-07-28 NOTE — Telephone Encounter (Signed)
Pt calling to check on status of surg clearance faxed to our office twice per pt-would like it faxed as soon as possible

## 2016-08-03 ENCOUNTER — Encounter (HOSPITAL_COMMUNITY): Payer: Self-pay

## 2016-08-03 NOTE — Patient Instructions (Addendum)
Timothy Dickson  08/03/2016   Your procedure is scheduled on: 08-15-16  Report to [facility] Main  Entrance take [facility]  elevators to 3rd floor to  [place] at  0930  AM.  Call this number if you have problems the morning of surgery [phone]   Remember: ONLY 1 PERSON MAY GO WITH YOU TO SHORT STAY TO GET  READY MORNING OF [place].  Do not eat food or drink liquids :After Midnight.     Take these medicines the morning of surgery with A SIP OF WATER: Carvedilol. Zetia. Flecainide. Omeprazole-if indicated.  DO NOT TAKE ANY DIABETIC MEDICATIONS DAY OF YOUR SURGERY                               You may not have any metal on your body including hair pins and              piercings  Do not wear jewelry, make-up, lotions, powders or perfumes, deodorant             Do not wear nail polish.  Do not shave  48 hours prior to surgery.              Men may shave face and neck.   Do not bring valuables to the hospital. [place].  Contacts, dentures or bridgework may not be worn into surgery.  Leave suitcase in the car. After surgery it may be brought to your room.     Patients discharged the day of surgery will not be allowed to drive home.  Name and phone number of your driver: Joycelyn Schmid -spouse [phone] cell  Special Instructions: N/A              Please read over the following fact sheets you were given: _____________________________________________________________________             [facility] - Preparing for Surgery Before surgery, you can play an important role.  Because skin is not sterile, your skin needs to be as free of germs as possible.  You can reduce the number of germs on your skin by washing with CHG (chlorahexidine gluconate) soap before surgery.  CHG is an antiseptic cleaner which kills germs and bonds with the skin to continue killing germs even after washing. Please DO NOT use if you  have an allergy to CHG or antibacterial soaps.  If your skin becomes reddened/irritated stop using the CHG and inform your nurse when you arrive at Short Stay. Do not shave (including legs and underarms) for at least 48 hours prior to the first CHG shower.  You may shave your face/neck. Please follow these instructions carefully:  1.  Shower with CHG Soap the night before surgery and the  morning of Surgery.  2.  If you choose to wash your hair, wash your hair first as usual with your  normal  shampoo.  3.  After you shampoo, rinse your hair and body thoroughly to remove the  shampoo.                           4.  Use CHG as you would any other liquid soap.  You can apply  chg directly  to the skin and wash                       Gently with a scrungie or clean washcloth.  5.  Apply the CHG Soap to your body ONLY FROM THE NECK DOWN.   Do not use on face/ open                           Wound or open sores. Avoid contact with eyes, ears mouth and genitals (private parts).                       Wash face,  Genitals (private parts) with your normal soap.             6.  Wash thoroughly, paying special attention to the area where your surgery  will be performed.  7.  Thoroughly rinse your body with warm water from the neck down.  8.  DO NOT shower/wash with your normal soap after using and rinsing off  the CHG Soap.                9.  Pat yourself dry with a clean towel.            10.  Wear clean pajamas.            11.  Place clean sheets on your bed the night of your first shower and do not  sleep with pets. Day of Surgery : Do not apply any lotions/deodorants the morning of surgery.  Please wear clean clothes to the hospital/surgery center.  FAILURE TO FOLLOW THESE INSTRUCTIONS MAY RESULT IN THE CANCELLATION OF YOUR SURGERY PATIENT SIGNATURE_________________________________  NURSE  SIGNATURE__________________________________  ________________________________________________________________________   Adam Phenix  An incentive spirometer is a tool that can help keep your lungs clear and active. This tool measures how well you are filling your lungs with each breath. Taking long deep breaths may help reverse or decrease the chance of developing breathing (pulmonary) problems (especially infection) following:  A long period of time when you are unable to move or be active. BEFORE THE PROCEDURE   If the spirometer includes an indicator to show your best effort, your nurse or respiratory therapist will set it to a desired goal.  If possible, sit up straight or lean slightly forward. Try not to slouch.  Hold the incentive spirometer in an upright position. INSTRUCTIONS FOR USE  1. Sit on the edge of your bed if possible, or sit up as far as you can in bed or on a chair. 2. Hold the incentive spirometer in an upright position. 3. Breathe out normally. 4. Place the mouthpiece in your mouth and seal your lips tightly around it. 5. Breathe in slowly and as deeply as possible, raising the piston or the ball toward the top of the column. 6. Hold your breath for 3-5 seconds or for as long as possible. Allow the piston or ball to fall to the bottom of the column. 7. Remove the mouthpiece from your mouth and breathe out normally. 8. Rest for a few seconds and repeat Steps 1 through 7 at least 10 times every 1-2 hours when you are awake. Take your time and take a few normal breaths between deep breaths. 9. The spirometer may include an indicator to show your best effort. Use the indicator as a goal to work toward during each repetition.  10. After each set of 10 deep breaths, practice coughing to be sure your lungs are clear. If you have an incision (the cut made at the time of surgery), support your incision when coughing by placing a pillow or rolled up towels firmly  against it. Once you are able to get out of bed, walk around indoors and cough well. You may stop using the incentive spirometer when instructed by your caregiver.  RISKS AND COMPLICATIONS  Take your time so you do not get dizzy or light-headed.  If you are in pain, you may need to take or ask for pain medication before doing incentive spirometry. It is harder to take a deep breath if you are having pain. AFTER USE  Rest and breathe slowly and easily.  It can be helpful to keep track of a log of your progress. Your caregiver can provide you with a simple table to help with this. If you are using the spirometer at home, follow these instructions: [place] IF:   You are having difficultly using the spirometer.  You have trouble using the spirometer as often as instructed.  Your pain medication is not giving enough relief while using the spirometer.  You develop fever of 100.5 F (38.1 C) or higher. SEEK IMMEDIATE MEDICAL CARE IF:   You cough up bloody sputum that had not been present before.  You develop fever of 102 F (38.9 C) or greater.  You develop worsening pain at or near the incision site. MAKE SURE YOU:   Understand these instructions.  Will watch your condition.  Will get help right away if you are not doing well or get worse. Document Released: 11/28/2006 Document Revised: 10/10/2011 Document Reviewed: 01/29/2007 ExitCare Patient Information 2014 ExitCare, [place].   ________________________________________________________________________  WHAT IS A BLOOD TRANSFUSION? Blood Transfusion Information  A transfusion is the replacement of blood or some of its parts. Blood is made up of multiple cells which provide different functions.  Red blood cells carry oxygen and are used for blood loss replacement.  White blood cells fight against infection.  Platelets control bleeding.  Plasma helps clot blood.  Other blood products are available for  specialized needs, such as hemophilia or other clotting disorders. BEFORE THE TRANSFUSION  Who gives blood for transfusions?   Healthy volunteers who are fully evaluated to make sure their blood is safe. This is blood bank blood. Transfusion therapy is the safest it has ever been in the practice of medicine. Before blood is taken from a donor, a complete history is taken to make sure that person has no history of diseases nor engages in risky social behavior (examples are intravenous drug use or sexual activity with multiple partners). The donor's travel history is screened to minimize risk of transmitting infections, such as malaria. The donated blood is tested for signs of infectious diseases, such as HIV and hepatitis. The blood is then tested to be sure it is compatible with you in order to minimize the chance of a transfusion reaction. If you or a relative donates blood, this is often done in anticipation of surgery and is not appropriate for emergency situations. It takes many days to process the donated blood. RISKS AND COMPLICATIONS Although transfusion therapy is very safe and saves many lives, the main dangers of transfusion include:   Getting an infectious disease.  Developing a transfusion reaction. This is an allergic reaction to something in the blood you were given. Every precaution is taken to prevent this. The  decision to have a blood transfusion has been considered carefully by your caregiver before blood is given. Blood is not given unless the benefits outweigh the risks. AFTER THE TRANSFUSION  Right after receiving a blood transfusion, you will usually feel much better and more energetic. This is especially true if your red blood cells have gotten low (anemic). The transfusion raises the level of the red blood cells which carry oxygen, and this usually causes an energy increase.  The nurse administering the transfusion will monitor you carefully for complications. HOME CARE  INSTRUCTIONS  No special instructions are needed after a transfusion. You may find your energy is better. Speak with your caregiver about any limitations on activity for underlying diseases you may have. SEEK MEDICAL CARE IF:   Your condition is not improving after your transfusion.  You develop redness or irritation at the intravenous (IV) site. SEEK IMMEDIATE MEDICAL CARE IF:  Any of the following symptoms occur over the next 12 hours:  Shaking chills.  You have a temperature by mouth above 102 F (38.9 C), not controlled by medicine.  Chest, back, or muscle pain.  People around you feel you are not acting correctly or are confused.  Shortness of breath or difficulty breathing.  Dizziness and fainting.  You get a rash or develop hives.  You have a decrease in urine output.  Your urine turns a dark color or changes to pink, red, or brown. Any of the following symptoms occur over the next 10 days:  You have a temperature by mouth above 102 F (38.9 C), not controlled by medicine.  Shortness of breath.  Weakness after normal activity.  The white part of the eye turns yellow (jaundice).  You have a decrease in the amount of urine or are urinating less often.  Your urine turns a dark color or changes to pink, red, or brown. Document Released: 07/15/2000 Document Revised: 10/10/2011 Document Reviewed: 03/03/2008 [facility] Patient Information 2014 [place], [place].  _______________________________________________________________________

## 2016-08-04 ENCOUNTER — Encounter (HOSPITAL_COMMUNITY): Payer: Self-pay

## 2016-08-04 ENCOUNTER — Encounter (HOSPITAL_COMMUNITY)
Admission: RE | Admit: 2016-08-04 | Discharge: 2016-08-04 | Disposition: A | Discharge status: Home / Self Care | Payer: Medicare Other | Source: Ambulatory Visit | Attending: Orthopedic Surgery | Admitting: Orthopedic Surgery

## 2016-08-04 ENCOUNTER — Ambulatory Visit: Payer: Self-pay | Admitting: Orthopedic Surgery

## 2016-08-04 DIAGNOSIS — I351 Nonrheumatic aortic (valve) insufficiency: Secondary | ICD-10-CM | POA: Insufficient documentation

## 2016-08-04 DIAGNOSIS — G5622 Lesion of ulnar nerve, left upper limb: Secondary | ICD-10-CM | POA: Insufficient documentation

## 2016-08-04 DIAGNOSIS — Z85528 Personal history of other malignant neoplasm of kidney: Secondary | ICD-10-CM | POA: Insufficient documentation

## 2016-08-04 DIAGNOSIS — I10 Essential (primary) hypertension: Secondary | ICD-10-CM | POA: Diagnosis not present

## 2016-08-04 DIAGNOSIS — Z905 Acquired absence of kidney: Secondary | ICD-10-CM | POA: Diagnosis not present

## 2016-08-04 DIAGNOSIS — G8929 Other chronic pain: Secondary | ICD-10-CM | POA: Diagnosis not present

## 2016-08-04 DIAGNOSIS — E1129 Type 2 diabetes mellitus with other diabetic kidney complication: Secondary | ICD-10-CM | POA: Diagnosis not present

## 2016-08-04 DIAGNOSIS — M170 Bilateral primary osteoarthritis of knee: Secondary | ICD-10-CM | POA: Diagnosis not present

## 2016-08-04 DIAGNOSIS — I471 Supraventricular tachycardia: Secondary | ICD-10-CM | POA: Diagnosis not present

## 2016-08-04 DIAGNOSIS — M545 Low back pain: Secondary | ICD-10-CM | POA: Insufficient documentation

## 2016-08-04 DIAGNOSIS — Z01812 Encounter for preprocedural laboratory examination: Secondary | ICD-10-CM | POA: Insufficient documentation

## 2016-08-04 DIAGNOSIS — I712 Thoracic aortic aneurysm, without rupture: Secondary | ICD-10-CM | POA: Diagnosis not present

## 2016-08-04 DIAGNOSIS — B182 Chronic viral hepatitis C: Secondary | ICD-10-CM | POA: Diagnosis not present

## 2016-08-04 DIAGNOSIS — E785 Hyperlipidemia, unspecified: Secondary | ICD-10-CM | POA: Insufficient documentation

## 2016-08-04 HISTORY — DX: Type 2 diabetes mellitus without complications: E11.9

## 2016-08-04 LAB — COMPREHENSIVE METABOLIC PANEL
ALT: 19 U/L (ref 17–63)
AST: 30 U/L (ref 15–41)
Albumin: 4.6 g/dL (ref 3.5–5.0)
Alkaline Phosphatase: 43 U/L (ref 38–126)
Anion gap: 7 (ref 5–15)
BUN: 19 mg/dL (ref 6–20)
CO2: 29 mmol/L (ref 22–32)
Calcium: 9.5 mg/dL (ref 8.9–10.3)
Chloride: 98 mmol/L — ABNORMAL LOW (ref 101–111)
Creatinine, Ser: 1.15 mg/dL (ref 0.61–1.24)
GFR calc Af Amer: >60 mL/min (ref >60)
GFR calc non Af Amer: >60 mL/min (ref >60)
Glucose, Bld: 142 mg/dL — ABNORMAL HIGH (ref 65–99)
Potassium: 3.9 mmol/L (ref 3.5–5.1)
Sodium: 134 mmol/L — ABNORMAL LOW (ref 135–145)
Total Bilirubin: 0.7 mg/dL (ref 0.3–1.2)
Total Protein: 6.9 g/dL (ref 6.5–8.1)

## 2016-08-04 LAB — ABO/RH: ABO/RH(D): A POS

## 2016-08-04 LAB — PROTIME-INR
INR: 1
Prothrombin Time: 13.2 seconds (ref 11.4–15.2)

## 2016-08-04 LAB — CBC
HCT: 43.6 % (ref 39.0–52.0)
Hemoglobin: 14.8 g/dL (ref 13.0–17.0)
MCH: 30.2 pg (ref 26.0–34.0)
MCHC: 33.9 g/dL (ref 30.0–36.0)
MCV: 89 fL (ref 78.0–100.0)
Platelets: 193 10*3/uL (ref 150–400)
RBC: 4.9 MIL/uL (ref 4.22–5.81)
RDW: 12.6 % (ref 11.5–15.5)
WBC: 7.6 10*3/uL (ref 4.0–10.5)

## 2016-08-04 LAB — URINALYSIS, ROUTINE W REFLEX MICROSCOPIC
Bilirubin Urine: NEGATIVE
Glucose, UA: NEGATIVE mg/dL
Hgb urine dipstick: NEGATIVE
Ketones, ur: NEGATIVE mg/dL
Leukocytes, UA: NEGATIVE
Nitrite: NEGATIVE
Protein, ur: NEGATIVE mg/dL
Specific Gravity, Urine: 1.014 (ref 1.005–1.030)
pH: 6 (ref 5.0–8.0)

## 2016-08-04 LAB — TYPE AND SCREEN
ABO/RH(D): A POS
Antibody Screen: NEGATIVE

## 2016-08-04 LAB — SURGICAL PCR SCREEN
MRSA, PCR: NEGATIVE
Staphylococcus aureus: POSITIVE — AB

## 2016-08-04 LAB — APTT: aPTT: 29 seconds (ref 24–36)

## 2016-08-04 LAB — GLUCOSE, CAPILLARY: Glucose-Capillary: 213 mg/dL — ABNORMAL HIGH (ref 65–99)

## 2016-08-04 NOTE — Progress Notes (Addendum)
08-04-16 1100 Pt states "will have left knee injected with cortisone" -not scheduled -please include in  MD order set. 08-04-16 1650 " Drew"  I received your fax note for" left knee cortisone injection", but no order entry is viewable with procedure consent, please readdress, thanks.

## 2016-08-04 NOTE — Pre-Procedure Instructions (Signed)
Clearance note -Dr. Meda Coffee with chart. EKG  5'17, Echo 6'17 Epic.

## 2016-08-05 ENCOUNTER — Ambulatory Visit (INDEPENDENT_AMBULATORY_CARE_PROVIDER_SITE_OTHER): Payer: Medicare Other | Admitting: Internal Medicine

## 2016-08-05 VITALS — BP 116/78 | HR 68 | Ht 71.0 in | Wt 207.0 lb

## 2016-08-05 DIAGNOSIS — I471 Supraventricular tachycardia: Secondary | ICD-10-CM

## 2016-08-05 LAB — HEMOGLOBIN A1C
Hgb A1c MFr Bld: 6.5 % — ABNORMAL HIGH (ref 4.8–5.6)
Mean Plasma Glucose: 140 mg/dL

## 2016-08-05 NOTE — Progress Notes (Signed)
Electrophysiology Office Note   Date:  08/05/2016   ID:  Timothy Dickson, DOB 07-05-1946, MRN [id number]  PCP:  Gara Kroner, MD  Cardiologist:  Dr Meda Coffee Primary Electrophysiologist: Thompson Grayer, MD    No chief complaint on file.    History of Present Illness: Timothy Dickson is a 71 y.o. male who presents today for electrophysiology evaluation.   He has a h/o PACs, PVCs, and SVT.  He has done very well over the past year without any symptoms of arrhythmia.  He is pleased with flecainide.  He has knee surgery planned. Today, he denies symptoms of exertional chest pain, shortness of breath, orthopnea, PND, lower extremity edema, claudication, dizziness, presyncope, syncope, bleeding, or neurologic sequela. The patient is tolerating medications without difficulties and is otherwise without complaint today.    Past Medical History:  Diagnosis Date  . Atrial tachycardia ([place])    documented by holter 2016. Followed by Dr. Liane Comber and Dr. Rayann Heman.  . 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 no further tx other than surgery [facility].  [name] Cirrhosis ([place])    [facility] dx, early stage- follow up visits at [facility].  [name] CRI (chronic renal insufficiency)    s/p R nephrectomy in 1999 for malignancy  . Diabetes mellitus without complication ([facility])    NIDDM- dx. 3-4 years ago diet control  . Diverticulosis of colon    mild  . GERD (gastroesophageal reflux disease)   . Headache(784.0)   . Hepatitis C    s/p therapy [facility] (curative)/with Harvoni  . History of BPH    no recent issues  . History of substance abuse    past history -none in 38 yrs  . Hyperglycemia   . Hypertension   . Osteoarthritis, knee    knees bilaterally   . Pneumonia 1975   hx of 35 years ago   Past Surgical History:  Procedure Laterality Date  . Arthroscopy right knee     may '11 (Dr Alvan Dame)  . Arthroscopy, left knee,    . history of liver biopsy  2007  . History of Nephrectomy Right 1999  . KNEE ARTHROSCOPY Left  10/10/2012   Procedure: LEFT KNEE ARTHROSCOPY WITH MENSCIAL DEBRIDEMENT AND CHONDROPLASTY;  Surgeon: Gearlean Alf, MD;  Location: [facility] ORS;  Service: Orthopedics;  Laterality: Left;  . left shoulder repair for bone spur  1990's  . Transurethral needle ablation procedure    . TUNA procedure       Current Outpatient Prescriptions  Medication Sig Dispense Refill  . B Complex-C (B-COMPLEX WITH VITAMIN C) tablet Take 1 tablet by mouth daily.    . carvedilol (COREG) 12.5 MG tablet Take 1 tablet (12.5 mg total) by mouth 2 (two) times daily with a meal. 180 tablet 11  . ezetimibe (ZETIA) 10 MG tablet Take 1 tablet (10 mg total) by mouth daily. 90 tablet 3  . flecainide (TAMBOCOR) 50 MG tablet Take 1 tablet (50 mg total) by mouth 3 (three) times daily. 270 tablet 2  . hydrochlorothiazide (HYDRODIURIL) 25 MG tablet Take 1 tablet (25 mg total) by mouth daily. 90 tablet 11  . naproxen sodium (ALEVE) 220 MG tablet Take 220 mg by mouth at bedtime as needed (For pain - 3 times weekly).    [name] omeprazole (PRILOSEC OTC) 20 MG tablet Take 10 mg by mouth 2 (two) times a week.    . sodium chloride (OCEAN) 0.65 % SOLN nasal spray Place 1 spray into both nostrils every morning.  No current facility-administered medications for this visit.     Allergies:   Statins and Aspirin   Social History:  The patient  reports that he quit smoking about 39 years ago. His smoking use included Cigarettes. He has a 15.00 pack-year smoking history. He has never used smokeless tobacco. He reports that he does not drink alcohol or use drugs.   Family History:  The patient's  family history includes Alcohol abuse in his father, other, and sister; Breast cancer in his sister; CVA in his mother; Cancer in his brother; Coronary artery disease in his mother; Diabetes in his sister; Esophageal cancer in his sister; Lung cancer in his sister; Prostate cancer in his brother.    ROS:  Please see the history of present illness.   All  other systems are reviewed and negative.    PHYSICAL EXAM: VS:  BP 116/78   Pulse 68   Ht 5\' 11"  (1.803 m)   Wt 207 lb (93.9 kg)   BMI 28.87 kg/m  , BMI Body mass index is 28.87 kg/m. GEN: Well nourished, well developed, in no acute distress  HEENT: normal  Neck: no JVD, carotid bruits, or masses Cardiac: RRR; no murmurs, rubs, or gallops,no edema  Respiratory:  clear to auscultation bilaterally, normal work of breathing GI: soft, nontender, nondistended, + BS MS: no deformity or atrophy  Skin: warm and dry  Neuro:  Strength and sensation are intact Psych: euthymic mood, full affect  EKG:  EKG is ordered today. The ekg ordered today shows sinus rhythm    Recent Labs: 08/04/2016: ALT 19; BUN 19; Creatinine, Ser 1.15; Hemoglobin 14.8; Platelets 193; Potassium 3.9; Sodium 134    Lipid Panel     Component Value Date/Time   CHOL 150 03/16/2016 1055   CHOL 206 (H) 12/21/2015 1522   TRIG 70 03/16/2016 1055   TRIG 161 (H) 12/21/2015 1522   HDL 57 03/16/2016 1055   HDL 45 12/21/2015 1522   CHOLHDL 2.6 03/16/2016 1055   VLDL 14 03/16/2016 1055   LDLCALC 79 03/16/2016 1055   LDLCALC 129 12/21/2015 1522     Wt Readings from Last 3 Encounters:  08/05/16 207 lb (93.9 kg)  08/04/16 206 lb (93.4 kg)  02/17/16 206 lb 6.4 oz (93.6 kg)       ASSESSMENT AND PLAN:  1.  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  He is doing very well with coreg and flecainide. He has documented pacs and nonsustained atach which are also well controlled with flecainide. No changes today  2. HTN Stable No change required today  3. preop Proceed with surgery if medically indicated  Return to see me in 1 year unless problems arise  Signed, Thompson Grayer, MD  08/05/2016 5:01 PM     [place] [address] [place] [address] [phone] (office) [phone] (fax)

## 2016-08-05 NOTE — Patient Instructions (Signed)

## 2016-08-05 NOTE — Progress Notes (Signed)
08-05-16 0940  Positive Staph aureus-will use Mupirocin Ointment as directed.  Note to office [phone]. Thanks can see order now for added procedure.

## 2016-08-14 ENCOUNTER — Ambulatory Visit: Payer: Self-pay | Admitting: Orthopedic Surgery

## 2016-08-14 NOTE — H&P (Signed)
Timothy Dickson DOB: [birth year] Married / Language: English / Race: White Male Date of Admission:  08/15/2016 CC:  Bilateral knee pain History of Present Illness The patient is a 71 year old male who comes in for a preoperative History and Physical. The patient is scheduled for a right total knee arthroplasty and a left knee cortisone injection to be performed by Dr. Dione Plover. Aluisio, MD at [facility] on 08-15-2016. The patient is a 71 year old male who presents for follow up of their knee. The patient is being followed for their bilateral knee pain and osteoarthritis. They are now out from right knee cortisone injection. Symptoms reported include: pain (throbbing, radiates down to ankle), aching and difficulty ambulating. The patient feels that they are doing poorly. Current treatment includes: bracing (economy hinged). The following medication has been used for pain control: Gabapentin, at night. The patient has reported improvement of their symptoms with: Cortisone injections and viscosupplementation but only temporary. He continues to have pain and now he is ready to proceed with surgery at this time.  He would also like to get a cortisone injection into the other knee at that time. They have been treated conservatively in the past for the above stated problem and despite conservative measures, they continue to have progressive pain and severe functional limitations and dysfunction. They have failed non-operative management including home exercise, medications, and injections. It is felt that they would benefit from undergoing total joint replacement. Risks and benefits of the procedure have been discussed with the patient and they elect to proceed with surgery. There are no active contraindications to surgery such as ongoing infection or rapidly progressive neurological disease.  Problem List/Past Medical S/P Knee Arthroscopy (Z48.89)  History of arthroscopy of knee (Z98.890)  Radicular  lumbar or thoracic pain (M54.10)  Internal derangement of the knee (M23.90)  Primary localized osteoarthritis of right knee (M17.11)  Chronic pain of left knee (M25.562)  Lumbar pain (724.2)  Liver disease  Other disease, cancer, significant illness  kidney cancer Cancer  Hepatitis C  Underwent Treatment - Cured Hypertension  Hypercholesterolemia  Atrial Fibrillation  Diet-Controlled Diabetes Mellitus   Allergies  No Known Drug Allergies [07/21/2016]: (Marked as Inactive)  Ultram *ANALGESICS - OPIOID*  No Reaction but wants to Avoid  Family History Cancer  sister and brother Cerebrovascular Accident  mother Drug / Alcohol Addiction  father  Social History Tobacco / smoke exposure  no Tobacco use  former smoker Previously in rehab  no Children  2 Current work status  working full time Drug/Alcohol Rehab (Currently)  no Alcohol use  former drinker Pain Contract  no Marital status  married 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 Zetia (10MG  Tablet, Oral) Active. Hydrochlorothiazide (25MG  Tablet, Oral) Active. Carvedilol (12.5MG  Tablet, Oral) Active. Flecainide Acetate (100MG  Tablet, Oral) Active. Vitamin B-12 (1000MCG Tablet, 1 (one) Oral) Active. Aleve (220MG  Tablet, Oral) Active.  Past Surgical History  Prostatectomy; Transurethral  Kidney Removal  right Arthroscopy of Knee  bilateral Arthroscopy of Shoulder  left   Review of Systems  General Not Present- Chills, Fatigue, Fever, Memory Loss, Night Sweats, Weight Gain and Weight Loss. Skin Not Present- Eczema, Hives, Itching, Lesions and Rash. HEENT Not Present- Dentures, Double Vision,  Headache, Hearing Loss, Tinnitus and Visual Loss. Respiratory Not Present- Allergies, Chronic Cough, Coughing up blood, Shortness of breath at rest and Shortness of breath with exertion. Cardiovascular Not Present- Chest Pain, Difficulty Breathing Lying Down, Murmur, Palpitations, Racing/skipping heartbeats and Swelling. Gastrointestinal Not Present- Abdominal Pain, Bloody Stool, Constipation, Diarrhea, Difficulty Swallowing, Heartburn, Jaundice, Loss of appetitie, Nausea and Vomiting. Male Genitourinary Not Present- Blood in Urine, Discharge, Flank Pain, Incontinence, Painful Urination, Urgency, Urinary frequency, Urinary Retention, Urinating at Night and Weak urinary stream. Musculoskeletal Present- Joint Pain, Morning Stiffness and Muscle Weakness. Not Present- Back Pain, Joint Swelling, Muscle Pain and Spasms. Neurological Not Present- Blackout spells, Difficulty with balance, Dizziness, Paralysis, Tremor and Weakness. Psychiatric Not Present- Insomnia.  Vitals Weight: 205 lb Height: 72in Weight was reported by patient. Body Surface Area: 2.15 m Body Mass Index: 27.8 kg/m  Pulse: 68 (Regular)  BP: 112/70 (Sitting, Left Arm, Standard)   Physical Exam  General Mental Status -Alert, cooperative and good historian. General Appearance-pleasant, Not in acute distress. Orientation-Oriented X3. Build & Nutrition-Well nourished and Well developed.  Head and Neck Head-normocephalic, atraumatic . Neck Global Assessment - supple, no bruit auscultated on the right, no bruit auscultated on the left.  Eye Vision-Wears corrective lenses. Pupil - Bilateral-Regular and Round. Motion - Bilateral-EOMI.  ENMT Note: upper partial denture plate   Chest and Lung Exam Auscultation Breath sounds - clear at anterior chest wall and clear at posterior chest wall. Adventitious sounds - No Adventitious sounds.  Cardiovascular Auscultation Rhythm - Regular rate and rhythm.  Heart Sounds - S1 WNL and S2 WNL. Murmurs & Other Heart Sounds - Auscultation of the heart reveals - No Murmurs.  Abdomen Palpation/Percussion Tenderness - Abdomen is non-tender to palpation. Rigidity (guarding) - Abdomen is soft. Auscultation Auscultation of the abdomen reveals - Bowel sounds normal.  Male Genitourinary Note: Not done, not pertinent to present illness   Musculoskeletal Note: Right knee, the patient does have noted effusion. He has got varus alignment deformity, range of motion 5 to 130. Moderate crepitus is noted. Tender on palpation especially over the medial joint line more so than lateral joint line.  RADIOGRAPHS X-rays, AP and lateral view of the right knee shows significant narrowing near bone on bone in the medial compartment. Lateral view confirms near bone on bone. He also has some slight narrowing of patellofemoral with some patella spurring.   Assessment & Plan  Primary osteoarthritis of both knees (M17.0)  Note:Surgical Plans: Right Total Knee Replacement and a Left Knee Cortisone Injection  Disposition: Home  PCP: Dr. Moreen Fowler Cards: Dr. Meda Coffee  IV TXA  Anesthesia Issues: None  Signed electronically by Ok Edwards, III PA-C

## 2016-08-15 ENCOUNTER — Inpatient Hospital Stay (HOSPITAL_COMMUNITY): Payer: Medicare Other | Admitting: Certified Registered Nurse Anesthetist

## 2016-08-15 ENCOUNTER — Inpatient Hospital Stay (HOSPITAL_COMMUNITY)
Admission: RE | Admit: 2016-08-15 | Discharge: 2016-08-17 | DRG: 470 | Disposition: A | Discharge status: Home Health | Payer: Medicare Other | Source: Ambulatory Visit | Attending: Orthopedic Surgery | Admitting: Orthopedic Surgery

## 2016-08-15 ENCOUNTER — Encounter (HOSPITAL_COMMUNITY): Payer: Self-pay | Admitting: *Deleted

## 2016-08-15 ENCOUNTER — Encounter (HOSPITAL_COMMUNITY)
Admission: RE | Disposition: A | Discharge status: Home Health | Payer: Self-pay | Source: Ambulatory Visit | Attending: Orthopedic Surgery

## 2016-08-15 DIAGNOSIS — I10 Essential (primary) hypertension: Secondary | ICD-10-CM | POA: Diagnosis not present

## 2016-08-15 DIAGNOSIS — Z905 Acquired absence of kidney: Secondary | ICD-10-CM | POA: Diagnosis not present

## 2016-08-15 DIAGNOSIS — R51 Headache: Secondary | ICD-10-CM | POA: Diagnosis not present

## 2016-08-15 DIAGNOSIS — N189 Chronic kidney disease, unspecified: Secondary | ICD-10-CM | POA: Diagnosis not present

## 2016-08-15 DIAGNOSIS — M179 Osteoarthritis of knee, unspecified: Secondary | ICD-10-CM | POA: Diagnosis present

## 2016-08-15 DIAGNOSIS — E1121 Type 2 diabetes mellitus with diabetic nephropathy: Secondary | ICD-10-CM | POA: Diagnosis not present

## 2016-08-15 DIAGNOSIS — Z471 Aftercare following joint replacement surgery: Secondary | ICD-10-CM | POA: Diagnosis not present

## 2016-08-15 DIAGNOSIS — Z8619 Personal history of other infectious and parasitic diseases: Secondary | ICD-10-CM | POA: Diagnosis not present

## 2016-08-15 DIAGNOSIS — Z886 Allergy status to analgesic agent status: Secondary | ICD-10-CM

## 2016-08-15 DIAGNOSIS — M1710 Unilateral primary osteoarthritis, unspecified knee: Secondary | ICD-10-CM | POA: Diagnosis present

## 2016-08-15 DIAGNOSIS — E1122 Type 2 diabetes mellitus with diabetic chronic kidney disease: Secondary | ICD-10-CM | POA: Diagnosis present

## 2016-08-15 DIAGNOSIS — G709 Myoneural disorder, unspecified: Secondary | ICD-10-CM | POA: Diagnosis not present

## 2016-08-15 DIAGNOSIS — Z791 Long term (current) use of non-steroidal anti-inflammatories (NSAID): Secondary | ICD-10-CM

## 2016-08-15 DIAGNOSIS — Z79899 Other long term (current) drug therapy: Secondary | ICD-10-CM

## 2016-08-15 DIAGNOSIS — E1129 Type 2 diabetes mellitus with other diabetic kidney complication: Secondary | ICD-10-CM | POA: Diagnosis not present

## 2016-08-15 DIAGNOSIS — K7460 Unspecified cirrhosis of liver: Secondary | ICD-10-CM | POA: Diagnosis present

## 2016-08-15 DIAGNOSIS — Z85528 Personal history of other malignant neoplasm of kidney: Secondary | ICD-10-CM | POA: Diagnosis not present

## 2016-08-15 DIAGNOSIS — I129 Hypertensive chronic kidney disease with stage 1 through stage 4 chronic kidney disease, or unspecified chronic kidney disease: Secondary | ICD-10-CM | POA: Diagnosis present

## 2016-08-15 DIAGNOSIS — G8918 Other acute postprocedural pain: Secondary | ICD-10-CM | POA: Diagnosis not present

## 2016-08-15 DIAGNOSIS — I471 Supraventricular tachycardia: Secondary | ICD-10-CM | POA: Diagnosis present

## 2016-08-15 DIAGNOSIS — Z87891 Personal history of nicotine dependence: Secondary | ICD-10-CM

## 2016-08-15 DIAGNOSIS — G5623 Lesion of ulnar nerve, bilateral upper limbs: Secondary | ICD-10-CM | POA: Diagnosis not present

## 2016-08-15 DIAGNOSIS — M170 Bilateral primary osteoarthritis of knee: Secondary | ICD-10-CM | POA: Diagnosis not present

## 2016-08-15 DIAGNOSIS — K219 Gastro-esophageal reflux disease without esophagitis: Secondary | ICD-10-CM | POA: Diagnosis not present

## 2016-08-15 DIAGNOSIS — Z96651 Presence of right artificial knee joint: Secondary | ICD-10-CM

## 2016-08-15 DIAGNOSIS — E119 Type 2 diabetes mellitus without complications: Secondary | ICD-10-CM | POA: Diagnosis not present

## 2016-08-15 DIAGNOSIS — M1712 Unilateral primary osteoarthritis, left knee: Secondary | ICD-10-CM | POA: Diagnosis not present

## 2016-08-15 DIAGNOSIS — I739 Peripheral vascular disease, unspecified: Secondary | ICD-10-CM | POA: Diagnosis not present

## 2016-08-15 DIAGNOSIS — M1711 Unilateral primary osteoarthritis, right knee: Secondary | ICD-10-CM | POA: Diagnosis not present

## 2016-08-15 DIAGNOSIS — G8929 Other chronic pain: Secondary | ICD-10-CM | POA: Diagnosis not present

## 2016-08-15 DIAGNOSIS — E1151 Type 2 diabetes mellitus with diabetic peripheral angiopathy without gangrene: Secondary | ICD-10-CM | POA: Diagnosis present

## 2016-08-15 HISTORY — PX: TOTAL KNEE ARTHROPLASTY: SHX125

## 2016-08-15 LAB — GLUCOSE, CAPILLARY
Glucose-Capillary: 146 mg/dL — ABNORMAL HIGH (ref 65–99)
Glucose-Capillary: 114 mg/dL — ABNORMAL HIGH (ref 65–99)
Glucose-Capillary: 160 mg/dL — ABNORMAL HIGH (ref 65–99)
Glucose-Capillary: 208 mg/dL — ABNORMAL HIGH (ref 65–99)

## 2016-08-15 SURGERY — ARTHROPLASTY, KNEE, TOTAL
Anesthesia: Spinal | Site: Knee | Laterality: Right

## 2016-08-15 MED ORDER — FENTANYL CITRATE (PF) 100 MCG/2ML IJ SOLN
INTRAMUSCULAR | Status: AC
  Filled 2016-08-15: qty 2

## 2016-08-15 MED ORDER — HYDROMORPHONE HCL 1 MG/ML IJ SOLN: 0.2500 mg | INTRAMUSCULAR | Status: DC | PRN

## 2016-08-15 MED ORDER — EPHEDRINE SULFATE-NACL 50-0.9 MG/10ML-% IV SOSY
PREFILLED_SYRINGE | INTRAVENOUS | Status: DC | PRN
  Administered 2016-08-15: 5 mg via INTRAVENOUS
  Administered 2016-08-15 (×5): 10 mg via INTRAVENOUS
  Administered 2016-08-15: 5 mg via INTRAVENOUS
  Administered 2016-08-15: 10 mg via INTRAVENOUS

## 2016-08-15 MED ORDER — BUPIVACAINE IN DEXTROSE 0.75-8.25 % IT SOLN
INTRATHECAL | Status: DC | PRN
  Administered 2016-08-15: 2 mL via INTRATHECAL

## 2016-08-15 MED ORDER — CEFAZOLIN SODIUM-DEXTROSE 2-4 GM/100ML-% IV SOLN
2.0000 g | Freq: Four times a day (QID) | INTRAVENOUS | Status: AC
  Administered 2016-08-15 – 2016-08-16 (×2): 2 g via INTRAVENOUS
  Filled 2016-08-15 (×2): qty 100

## 2016-08-15 MED ORDER — ACETAMINOPHEN 10 MG/ML IV SOLN
INTRAVENOUS | Status: AC
  Filled 2016-08-15: qty 100

## 2016-08-15 MED ORDER — DOCUSATE SODIUM 100 MG PO CAPS
100.0000 mg | ORAL_CAPSULE | Freq: Two times a day (BID) | ORAL | Status: DC
  Administered 2016-08-15 – 2016-08-17 (×4): 100 mg via ORAL
  Filled 2016-08-15 (×4): qty 1

## 2016-08-15 MED ORDER — OMEPRAZOLE 10 MG PO CPDR: 10.0000 mg | DELAYED_RELEASE_CAPSULE | ORAL | Status: DC

## 2016-08-15 MED ORDER — METHYLPREDNISOLONE ACETATE 40 MG/ML IJ SUSP
INTRAMUSCULAR | Status: DC | PRN
  Administered 2016-08-15: 40 mg via INTRA_ARTICULAR

## 2016-08-15 MED ORDER — ONDANSETRON HCL 4 MG/2ML IJ SOLN: 4.0000 mg | Freq: Four times a day (QID) | INTRAMUSCULAR | Status: DC | PRN

## 2016-08-15 MED ORDER — OXYCODONE HCL 5 MG PO TABS
5.0000 mg | ORAL_TABLET | ORAL | Status: DC | PRN
  Administered 2016-08-15 – 2016-08-16 (×3): 10 mg via ORAL
  Filled 2016-08-15 (×3): qty 2

## 2016-08-15 MED ORDER — ROPIVACAINE HCL 7.5 MG/ML IJ SOLN
INTRAMUSCULAR | Status: DC | PRN
  Administered 2016-08-15: 20 mL via PERINEURAL

## 2016-08-15 MED ORDER — CHLORHEXIDINE GLUCONATE 4 % EX LIQD: 60.0000 mL | Freq: Once | CUTANEOUS | Status: DC

## 2016-08-15 MED ORDER — EZETIMIBE 10 MG PO TABS
10.0000 mg | ORAL_TABLET | Freq: Every day | ORAL | Status: DC
  Administered 2016-08-16 – 2016-08-17 (×2): 10 mg via ORAL
  Filled 2016-08-15 (×2): qty 1

## 2016-08-15 MED ORDER — PROMETHAZINE HCL 25 MG/ML IJ SOLN: 6.2500 mg | INTRAMUSCULAR | Status: DC | PRN

## 2016-08-15 MED ORDER — SODIUM CHLORIDE 0.9 % IV SOLN
INTRAVENOUS | Status: DC
  Administered 2016-08-15: 18:00:00 via INTRAVENOUS

## 2016-08-15 MED ORDER — CARVEDILOL 12.5 MG PO TABS
12.5000 mg | ORAL_TABLET | Freq: Two times a day (BID) | ORAL | Status: DC
  Administered 2016-08-15 – 2016-08-17 (×4): 12.5 mg via ORAL
  Filled 2016-08-15 (×4): qty 1

## 2016-08-15 MED ORDER — ACETAMINOPHEN 10 MG/ML IV SOLN
1000.0000 mg | Freq: Once | INTRAVENOUS | Status: AC
  Administered 2016-08-15: 1000 mg via INTRAVENOUS
  Filled 2016-08-15: qty 100

## 2016-08-15 MED ORDER — PROPOFOL 500 MG/50ML IV EMUL
INTRAVENOUS | Status: DC | PRN
  Administered 2016-08-15: 25 ug/kg/min via INTRAVENOUS

## 2016-08-15 MED ORDER — METOCLOPRAMIDE HCL 5 MG/ML IJ SOLN: 5.0000 mg | Freq: Three times a day (TID) | INTRAMUSCULAR | Status: DC | PRN

## 2016-08-15 MED ORDER — DIAZEPAM 5 MG PO TABS
5.0000 mg | ORAL_TABLET | Freq: Four times a day (QID) | ORAL | Status: DC | PRN
  Administered 2016-08-15 – 2016-08-17 (×5): 5 mg via ORAL
  Filled 2016-08-15 (×5): qty 1

## 2016-08-15 MED ORDER — MENTHOL 3 MG MT LOZG: 1.0000 | LOZENGE | OROMUCOSAL | Status: DC | PRN

## 2016-08-15 MED ORDER — HYDROCHLOROTHIAZIDE 25 MG PO TABS
25.0000 mg | ORAL_TABLET | Freq: Every day | ORAL | Status: DC
  Administered 2016-08-16 – 2016-08-17 (×2): 25 mg via ORAL
  Filled 2016-08-15 (×2): qty 1

## 2016-08-15 MED ORDER — LACTATED RINGERS IV SOLN: INTRAVENOUS | Status: DC

## 2016-08-15 MED ORDER — MEPERIDINE HCL 50 MG/ML IJ SOLN: 6.2500 mg | INTRAMUSCULAR | Status: DC | PRN

## 2016-08-15 MED ORDER — PROPOFOL 10 MG/ML IV BOLUS
INTRAVENOUS | Status: AC
  Filled 2016-08-15: qty 40

## 2016-08-15 MED ORDER — FENTANYL CITRATE (PF) 100 MCG/2ML IJ SOLN
INTRAMUSCULAR | Status: DC | PRN
  Administered 2016-08-15 (×2): 50 ug via INTRAVENOUS

## 2016-08-15 MED ORDER — DEXAMETHASONE SODIUM PHOSPHATE 10 MG/ML IJ SOLN
10.0000 mg | Freq: Once | INTRAMUSCULAR | Status: AC
  Administered 2016-08-15: 10 mg via INTRAVENOUS

## 2016-08-15 MED ORDER — BUPIVACAINE LIPOSOME 1.3 % IJ SUSP
20.0000 mL | Freq: Once | INTRAMUSCULAR | Status: DC
  Filled 2016-08-15: qty 20

## 2016-08-15 MED ORDER — CEFAZOLIN SODIUM-DEXTROSE 2-4 GM/100ML-% IV SOLN
2.0000 g | INTRAVENOUS | Status: AC
  Administered 2016-08-15: 2 g via INTRAVENOUS
  Filled 2016-08-15: qty 100

## 2016-08-15 MED ORDER — SODIUM CHLORIDE 0.9 % IR SOLN
Status: DC | PRN
  Administered 2016-08-15: 1000 mL

## 2016-08-15 MED ORDER — TRANEXAMIC ACID 1000 MG/10ML IV SOLN
1000.0000 mg | INTRAVENOUS | Status: DC
  Filled 2016-08-15: qty 10

## 2016-08-15 MED ORDER — TRANEXAMIC ACID 1000 MG/10ML IV SOLN
1000.0000 mg | Freq: Once | INTRAVENOUS | Status: DC
  Filled 2016-08-15: qty 10

## 2016-08-15 MED ORDER — METHOCARBAMOL 1000 MG/10ML IJ SOLN
500.0000 mg | Freq: Four times a day (QID) | INTRAVENOUS | Status: DC | PRN
  Administered 2016-08-15: 500 mg via INTRAVENOUS
  Filled 2016-08-15: qty 5
  Filled 2016-08-15: qty 550

## 2016-08-15 MED ORDER — ROPIVACAINE HCL 7.5 MG/ML IJ SOLN
INTRAMUSCULAR | Status: AC
  Filled 2016-08-15: qty 20

## 2016-08-15 MED ORDER — EPHEDRINE 5 MG/ML INJ
INTRAVENOUS | Status: AC
Start: 2016-08-15 — End: 2016-08-15
  Filled 2016-08-15: qty 10

## 2016-08-15 MED ORDER — LACTATED RINGERS IV SOLN
INTRAVENOUS | Status: DC
  Administered 2016-08-15 (×2): via INTRAVENOUS

## 2016-08-15 MED ORDER — ONDANSETRON HCL 4 MG/2ML IJ SOLN
INTRAMUSCULAR | Status: AC
  Filled 2016-08-15: qty 2

## 2016-08-15 MED ORDER — DEXAMETHASONE SODIUM PHOSPHATE 10 MG/ML IJ SOLN
INTRAMUSCULAR | Status: AC
  Filled 2016-08-15: qty 1

## 2016-08-15 MED ORDER — DIPHENHYDRAMINE HCL 12.5 MG/5ML PO ELIX: 12.5000 mg | ORAL_SOLUTION | ORAL | Status: DC | PRN

## 2016-08-15 MED ORDER — POLYETHYLENE GLYCOL 3350 17 G PO PACK: 17.0000 g | PACK | Freq: Every day | ORAL | Status: DC | PRN

## 2016-08-15 MED ORDER — DEXAMETHASONE SODIUM PHOSPHATE 10 MG/ML IJ SOLN
10.0000 mg | Freq: Once | INTRAMUSCULAR | Status: AC
  Administered 2016-08-16: 10 mg via INTRAVENOUS
  Filled 2016-08-15: qty 1

## 2016-08-15 MED ORDER — PHENOL 1.4 % MT LIQD
1.0000 | OROMUCOSAL | Status: DC | PRN
  Filled 2016-08-15: qty 177

## 2016-08-15 MED ORDER — BISACODYL 10 MG RE SUPP: 10.0000 mg | Freq: Every day | RECTAL | Status: DC | PRN

## 2016-08-15 MED ORDER — INSULIN ASPART 100 UNIT/ML ~~LOC~~ SOLN
0.0000 [IU] | Freq: Three times a day (TID) | SUBCUTANEOUS | Status: DC
  Administered 2016-08-16: 3 [IU] via SUBCUTANEOUS

## 2016-08-15 MED ORDER — MORPHINE SULFATE (PF) 2 MG/ML IV SOLN
1.0000 mg | INTRAVENOUS | Status: DC | PRN
  Administered 2016-08-15 (×2): 1 mg via INTRAVENOUS
  Filled 2016-08-15 (×2): qty 1

## 2016-08-15 MED ORDER — ONDANSETRON HCL 4 MG/2ML IJ SOLN
INTRAMUSCULAR | Status: DC | PRN
  Administered 2016-08-15: 4 mg via INTRAVENOUS

## 2016-08-15 MED ORDER — ACETAMINOPHEN 650 MG RE SUPP: 650.0000 mg | Freq: Four times a day (QID) | RECTAL | Status: DC | PRN

## 2016-08-15 MED ORDER — ONDANSETRON HCL 4 MG PO TABS
4.0000 mg | ORAL_TABLET | Freq: Four times a day (QID) | ORAL | Status: DC | PRN
  Administered 2016-08-17: 4 mg via ORAL
  Filled 2016-08-15: qty 1

## 2016-08-15 MED ORDER — SODIUM CHLORIDE 0.9 % IJ SOLN
INTRAMUSCULAR | Status: AC
  Filled 2016-08-15: qty 50

## 2016-08-15 MED ORDER — ACETAMINOPHEN 325 MG PO TABS: 650.0000 mg | ORAL_TABLET | Freq: Four times a day (QID) | ORAL | Status: DC | PRN

## 2016-08-15 MED ORDER — FLECAINIDE ACETATE 50 MG PO TABS
50.0000 mg | ORAL_TABLET | Freq: Three times a day (TID) | ORAL | Status: DC
  Administered 2016-08-15 – 2016-08-17 (×5): 50 mg via ORAL
  Filled 2016-08-15 (×6): qty 1

## 2016-08-15 MED ORDER — METOCLOPRAMIDE HCL 5 MG PO TABS: 5.0000 mg | ORAL_TABLET | Freq: Three times a day (TID) | ORAL | Status: DC | PRN

## 2016-08-15 MED ORDER — CEFAZOLIN SODIUM-DEXTROSE 2-4 GM/100ML-% IV SOLN
INTRAVENOUS | Status: AC
  Filled 2016-08-15: qty 100

## 2016-08-15 MED ORDER — FLEET ENEMA 7-19 GM/118ML RE ENEM: 1.0000 | ENEMA | Freq: Once | RECTAL | Status: DC | PRN

## 2016-08-15 MED ORDER — METHYLPREDNISOLONE ACETATE 40 MG/ML IJ SUSP
INTRAMUSCULAR | Status: AC
  Filled 2016-08-15: qty 1

## 2016-08-15 MED ORDER — BUPIVACAINE LIPOSOME 1.3 % IJ SUSP
INTRAMUSCULAR | Status: DC | PRN
  Administered 2016-08-15: 20 mL

## 2016-08-15 MED ORDER — METHOCARBAMOL 500 MG PO TABS: 500.0000 mg | ORAL_TABLET | Freq: Four times a day (QID) | ORAL | Status: DC | PRN

## 2016-08-15 MED ORDER — MIDAZOLAM HCL 5 MG/5ML IJ SOLN
INTRAMUSCULAR | Status: DC | PRN
  Administered 2016-08-15 (×2): 1 mg via INTRAVENOUS

## 2016-08-15 MED ORDER — OMEPRAZOLE MAGNESIUM 20 MG PO TBEC: 10.0000 mg | DELAYED_RELEASE_TABLET | ORAL | Status: DC

## 2016-08-15 MED ORDER — SODIUM CHLORIDE 0.9 % IJ SOLN
INTRAMUSCULAR | Status: DC | PRN
  Administered 2016-08-15: 30 mL

## 2016-08-15 MED ORDER — MIDAZOLAM HCL 2 MG/2ML IJ SOLN
INTRAMUSCULAR | Status: AC
Start: 2016-08-15 — End: 2016-08-15
  Filled 2016-08-15: qty 2

## 2016-08-15 MED ORDER — RIVAROXABAN 10 MG PO TABS
10.0000 mg | ORAL_TABLET | Freq: Every day | ORAL | Status: DC
  Administered 2016-08-16 – 2016-08-17 (×2): 10 mg via ORAL
  Filled 2016-08-15 (×2): qty 1

## 2016-08-15 SURGICAL SUPPLY — 51 items
BAG DECANTER FOR FLEXI CONT (MISCELLANEOUS) ×2 IMPLANT
BAG ZIPLOCK 12X15 (MISCELLANEOUS) ×2 IMPLANT
BANDAGE ACE 6X5 VEL STRL LF (GAUZE/BANDAGES/DRESSINGS) ×2 IMPLANT
BANDAGE ELASTIC 6 VELCRO ST LF (GAUZE/BANDAGES/DRESSINGS) ×2 IMPLANT
BLADE SAG 18X100X1.27 (BLADE) ×2 IMPLANT
BLADE SAW SGTL 11.0X1.19X90.0M (BLADE) ×2 IMPLANT
BOWL SMART MIX CTS (DISPOSABLE) ×2 IMPLANT
CAPT KNEE TOTAL 3 ATTUNE ×2 IMPLANT
CEMENT HV SMART SET (Cement) ×4 IMPLANT
CLOTH BEACON ORANGE TIMEOUT ST (SAFETY) ×2 IMPLANT
CUFF TOURN SGL QUICK 34 (TOURNIQUET CUFF) ×1
CUFF TRNQT CYL 34X4X40X1 (TOURNIQUET CUFF) ×1 IMPLANT
DECANTER SPIKE VIAL GLASS SM (MISCELLANEOUS) ×2 IMPLANT
DRAPE U-SHAPE 47X51 STRL (DRAPES) ×2 IMPLANT
DRSG ADAPTIC 3X8 NADH LF (GAUZE/BANDAGES/DRESSINGS) ×2 IMPLANT
DRSG PAD ABDOMINAL 8X10 ST (GAUZE/BANDAGES/DRESSINGS) ×2 IMPLANT
DURAPREP 26ML APPLICATOR (WOUND CARE) ×2 IMPLANT
ELECT REM PT RETURN 9FT ADLT (ELECTROSURGICAL) ×2
ELECTRODE REM PT RTRN 9FT ADLT (ELECTROSURGICAL) ×1 IMPLANT
EVACUATOR 1/8 PVC DRAIN (DRAIN) ×2 IMPLANT
GAUZE SPONGE 4X4 12PLY STRL (GAUZE/BANDAGES/DRESSINGS) ×2 IMPLANT
GLOVE BIO SURGEON STRL SZ7.5 (GLOVE) IMPLANT
GLOVE BIO SURGEON STRL SZ8 (GLOVE) ×2 IMPLANT
GLOVE BIOGEL PI IND STRL 6.5 (GLOVE) IMPLANT
GLOVE BIOGEL PI IND STRL 8 (GLOVE) ×1 IMPLANT
GLOVE BIOGEL PI INDICATOR 6.5 (GLOVE)
GLOVE BIOGEL PI INDICATOR 8 (GLOVE) ×1
GLOVE SURG SS PI 6.5 STRL IVOR (GLOVE) IMPLANT
GOWN STRL REUS W/TWL LRG LVL3 (GOWN DISPOSABLE) ×2 IMPLANT
GOWN STRL REUS W/TWL XL LVL3 (GOWN DISPOSABLE) IMPLANT
HANDPIECE INTERPULSE COAX TIP (DISPOSABLE) ×1
IMMOBILIZER KNEE 20 (SOFTGOODS) ×2
IMMOBILIZER KNEE 20 THIGH 36 (SOFTGOODS) ×1 IMPLANT
MANIFOLD NEPTUNE II (INSTRUMENTS) ×2 IMPLANT
NS IRRIG 1000ML POUR BTL (IV SOLUTION) ×2 IMPLANT
PACK TOTAL KNEE CUSTOM (KITS) ×2 IMPLANT
PAD ABD 8X10 STRL (GAUZE/BANDAGES/DRESSINGS) ×2 IMPLANT
PADDING CAST COTTON 6X4 STRL (CAST SUPPLIES) ×2 IMPLANT
POSITIONER SURGICAL ARM (MISCELLANEOUS) ×2 IMPLANT
SET HNDPC FAN SPRY TIP SCT (DISPOSABLE) ×1 IMPLANT
STRIP CLOSURE SKIN 1/2X4 (GAUZE/BANDAGES/DRESSINGS) ×2 IMPLANT
SUT MNCRL AB 4-0 PS2 18 (SUTURE) ×2 IMPLANT
SUT VIC AB 2-0 CT1 27 (SUTURE) ×3
SUT VIC AB 2-0 CT1 TAPERPNT 27 (SUTURE) ×3 IMPLANT
SUT VLOC 180 0 24IN GS25 (SUTURE) ×2 IMPLANT
SYR 50ML LL SCALE MARK (SYRINGE) ×2 IMPLANT
TRAY FOLEY W/METER SILVER 16FR (SET/KITS/TRAYS/PACK) ×2 IMPLANT
WATER STERILE IRR 1000ML POUR (IV SOLUTION) ×4 IMPLANT
WATER STERILE IRR 1500ML POUR (IV SOLUTION) IMPLANT
WRAP KNEE MAXI GEL POST OP (GAUZE/BANDAGES/DRESSINGS) ×2 IMPLANT
YANKAUER SUCT BULB TIP 10FT TU (MISCELLANEOUS) ×2 IMPLANT

## 2016-08-15 NOTE — Op Note (Signed)
OPERATIVE REPORT-TOTAL KNEE ARTHROPLASTY   Pre-operative diagnosis- Osteoarthritis  Bilateral knee(s)  Post-operative diagnosis- Osteoarthritis Bilateral knee(s)  Procedure-  Right  Total Knee Arthroplasty (Depuy Attune)   Left knee cortisone injection  Surgeon- Dione Plover. Dusten Ellinwood, MD  Assistant- Arlee Muslim, Pa-C   Anesthesia-  Adductor canal block and spinal  EBL-* No blood loss amount entered *   Drains Hemovac  Tourniquet time-  Total Tourniquet Time Documented: Thigh (Right) - 32 minutes Total: Thigh (Right) - 32 minutes     Complications- None  Condition-Adductor canal block and spinal   Brief Clinical Note  Timothy Dickson is a 71 y.o. year old male with end stage OA of his right knee with progressively worsening pain and dysfunction. He has constant pain, with activity and at rest and significant functional deficits with difficulties even with ADLs. He has had extensive non-op management including analgesics, injections of cortisone and viscosupplements, and home exercise program, but remains in significant pain with significant dysfunction. Radiographs show bone on bone arthritis medial and patellofemoral. He presents now for right Total Knee Arthroplasty.  He also has advanced OA left knee and requests cortisone injection  Procedure in detail---   The patient is brought into the operating room and positioned supine on the operating table. After successful administration of Adductor canal block and spinal ,   a tourniquet is placed high on the  Right thigh(s) and the lower extremity is prepped and draped in the usual sterile fashion. Time out is performed by the operating team and then the  Right lower extremity is wrapped in Esmarch, knee flexed and the tourniquet inflated to 300 mmHg.       A midline incision is made with a ten blade through the subcutaneous tissue to the level of the extensor mechanism. A fresh blade is used to make a medial parapatellar arthrotomy.  Soft tissue over the proximal medial tibia is subperiosteally elevated to the joint line with a knife and into the semimembranosus bursa with a Cobb elevator. Soft tissue over the proximal lateral tibia is elevated with attention being paid to avoiding the patellar tendon on the tibial tubercle. The patella is everted, knee flexed 90 degrees and the ACL and PCL are removed. Findings are bone on bone medial and patellofemoral with large global osteophytes.        The drill is used to create a starting hole in the distal femur and the canal is thoroughly irrigated with sterile saline to remove the fatty contents. The 5 degree Right  valgus alignment guide is placed into the femoral canal and the distal femoral cutting block is pinned to remove 9 mm off the distal femur. Resection is made with an oscillating saw.      The tibia is subluxed forward and the menisci are removed. The extramedullary alignment guide is placed referencing proximally at the medial aspect of the tibial tubercle and distally along the second metatarsal axis and tibial crest. The block is pinned to remove 62mm off the more deficient medial  side. Resection is made with an oscillating saw. Size 7is the most appropriate size for the tibia and the proximal tibia is prepared with the modular drill and keel punch for that size.      The femoral sizing guide is placed and size 7 is most appropriate. Rotation is marked off the epicondylar axis and confirmed by creating a rectangular flexion gap at 90 degrees. The size 7 cutting block is pinned in this rotation and the  anterior, posterior and chamfer cuts are made with the oscillating saw. The intercondylar block is then placed and that cut is made.      Trial size 7 tibial component, trial size 7 posterior stabilized femur and a 8  mm posterior stabilized rotating platform insert trial is placed. Full extension is achieved with excellent varus/valgus and anterior/posterior balance throughout full  range of motion. The patella is everted and thickness measured to be 27  mm. Free hand resection is taken to 15 mm, a 41 template is placed, lug holes are drilled, trial patella is placed, and it tracks normally. Osteophytes are removed off the posterior femur with the trial in place. All trials are removed and the cut bone surfaces prepared with pulsatile lavage. Cement is mixed and once ready for implantation, the size 7 tibial implant, size  7 posterior stabilized femoral component, and the size 41 patella are cemented in place and the patella is held with the clamp. The trial insert is placed and the knee held in full extension. The Exparel (20 ml mixed with 30 ml saline) is injected into the extensor mechanism, posterior capsule, medial and lateral gutters and subcutaneous tissues.  All extruded cement is removed and once the cement is hard the permanent 8 mm posterior stabilized rotating platform insert is placed into the tibial tray.      The wound is copiously irrigated with saline solution and the extensor mechanism closed over a hemovac drain with #1 V-loc suture. The tourniquet is released for a total tourniquet time of 32  minutes. Flexion against gravity is 140 degrees and the patella tracks normally. Subcutaneous tissue is closed with 2.0 vicryl and subcuticular with running 4.0 Monocryl. The incision is cleaned and dried and steri-strips and a bulky sterile dressing are applied. The limb is placed into a knee immobilizer.      The left knee is prepped with Betadine and injected with 40 mg (1 ml) of Depomedrol without problems and the patient is then awakened and transported to recovery in stable condition.      Please note that a surgical assistant was a medical necessity for this procedure in order to perform it in a safe and expeditious manner. Surgical assistant was necessary to retract the ligaments and vital neurovascular structures to prevent injury to them and also necessary for proper  positioning of the limb to allow for anatomic placement of the prosthesis.   Dione Plover Oskar Cretella, MD    08/15/2016, 1:57 PM

## 2016-08-15 NOTE — Anesthesia Procedure Notes (Addendum)
Procedure Name: MAC Date/Time: 08/15/2016 12:51 PM Performed by: West Pugh Pre-anesthesia Checklist: Patient identified, Timeout performed, Emergency Drugs available, Suction available and Patient being monitored Patient Re-evaluated:Patient Re-evaluated prior to inductionOxygen Delivery Method: Simple face mask Placement Confirmation: positive ETCO2 Dental Injury: Teeth and Oropharynx as per pre-operative assessment

## 2016-08-15 NOTE — Anesthesia Preprocedure Evaluation (Addendum)
Anesthesia Evaluation  Patient identified by MRN, date of birth, ID band Patient awake    Reviewed: Allergy & Precautions, NPO status , Patient's Chart, lab work & pertinent test results, reviewed documented beta blocker date and time   Airway Mallampati: II  TM Distance: >3 FB Neck ROM: Full    Dental  (+) Teeth Intact, Dental Advisory Given   Pulmonary neg pulmonary ROS, former smoker,    breath sounds clear to auscultation       Cardiovascular hypertension, Pt. on home beta blockers and Pt. on medications + Peripheral Vascular Disease   Rhythm:Regular Rate:Normal     Neuro/Psych  Headaches,  Neuromuscular disease negative psych ROS   GI/Hepatic GERD  Medicated,(+) Cirrhosis       , Hepatitis -, C  Endo/Other  diabetes  Renal/GU CRFRenal disease  negative genitourinary   Musculoskeletal  (+) Arthritis , Osteoarthritis,    Abdominal   Peds negative pediatric ROS (+)  Hematology negative hematology ROS (+)   Anesthesia Other Findings   Reproductive/Obstetrics negative OB ROS                           Lab Results  Component Value Date   WBC 7.6 08/04/2016   HGB 14.8 08/04/2016   HCT 43.6 08/04/2016   MCV 89.0 08/04/2016   PLT 193 08/04/2016   Lab Results  Component Value Date   CREATININE 1.15 08/04/2016   BUN 19 08/04/2016   NA 134 (L) 08/04/2016   K 3.9 08/04/2016   CL 98 (L) 08/04/2016   CO2 29 08/04/2016   Lab Results  Component Value Date   INR 1.00 08/04/2016   INR 1.0 07/31/2008   08/2016 EKG: normal sinus rhythm, RBBB.  Anesthesia Physical Anesthesia Plan  ASA: II  Anesthesia Plan: Spinal   Post-op Pain Management:  Regional for Post-op pain   Induction: Intravenous  Airway Management Planned: Natural Airway  Additional Equipment:   Intra-op Plan:   Post-operative Plan:   Informed Consent: I have reviewed the patients History and Physical, chart,  labs and discussed the procedure including the risks, benefits and alternatives for the proposed anesthesia with the patient or authorized representative who has indicated his/her understanding and acceptance.     Plan Discussed with: CRNA  Anesthesia Plan Comments:         Anesthesia Quick Evaluation

## 2016-08-15 NOTE — Transfer of Care (Signed)
Immediate Anesthesia Transfer of Care Note  Patient: Timothy Dickson  Procedure(s) Performed: Procedure(s): RIGHT TOTAL KNEE ARTHROPLASTY, CORTISONE INJECTION OF LEFT KNEE (Right)  Patient Location: PACU  Anesthesia Type:Spinal and MAC combined with regional for post-op pain  Level of Consciousness:  sedated, patient cooperative and responds to stimulation  Airway & Oxygen Therapy:Patient Spontanous Breathing and Patient connected to face mask oxgen  Post-op Assessment:  Report given to PACU RN and Post -op Vital signs reviewed and stable  Post vital signs:  Reviewed and stable  Last Vitals:  Vitals:   08/15/16 1245 08/15/16 1246  BP: (!) 115/93   Pulse: (!) 59 63  Resp: (!) 8 (!) 9  Temp:      Complications: No apparent anesthesia complications

## 2016-08-15 NOTE — Anesthesia Procedure Notes (Signed)
Anesthesia Regional Block:  Adductor canal block  Pre-Anesthetic Checklist: ,, timeout performed, Correct Patient, Correct Site, Correct Laterality, Correct Procedure, Correct Position, site marked, Risks and benefits discussed,  Surgical consent,  Pre-op evaluation,  At surgeon's request and post-op pain management  Laterality: Right  Prep: chloraprep       Needles:  Injection technique: Single-shot  Needle Type: Echogenic Needle     Needle Length: 9cm 9 cm Needle Gauge: 21 and 21 G    Additional Needles:  Procedures: ultrasound guided (picture in chart) Adductor canal block Narrative:  Start time: 08/15/2016 12:40 PM End time: 08/15/2016 12:45 PM Injection made incrementally with aspirations every 5 mL.  Performed by: Personally  Anesthesiologist: Suella Broad D  Additional Notes: Pt tolerated well.

## 2016-08-15 NOTE — H&P (View-Only) (Signed)
Pre-surgical testing  Please note that a new consent has been ordered to include a left knee cortisone injection to be performed along with the right total knee arthroplasty on 08/15/2016 by Dr. Wynelle Link. Thanks  Arlee Muslim, PA-C

## 2016-08-15 NOTE — Interval H&P Note (Signed)
History and Physical Interval Note:  08/15/2016 12:26 PM  Timothy Dickson  has presented today for surgery, with the diagnosis of RIGHT KNEE OA  The various methods of treatment have been discussed with the patient and family. After consideration of risks, benefits and other options for treatment, the patient has consented to  Procedure(s): RIGHT TOTAL KNEE ARTHROPLASTY (Right) as a surgical intervention .  The patient's history has been reviewed, patient examined, no change in status, stable for surgery.  I have reviewed the patient's chart and labs.  Questions were answered to the patient's satisfaction.     Gearlean Alf

## 2016-08-15 NOTE — Anesthesia Procedure Notes (Signed)
Spinal  Patient location during procedure: OR Start time: 08/15/2016 12:55 PM End time: 08/15/2016 1:03 PM Reason for block: at surgeon's request Staffing Anesthesiologist: Suella Broad D Resident/CRNA: West Pugh Performed: resident/CRNA  Preanesthetic Checklist Completed: patient identified, site marked, surgical consent, pre-op evaluation, timeout performed, IV checked, risks and benefits discussed and monitors and equipment checked Spinal Block Patient position: sitting Prep: Betadine Patient monitoring: heart rate, continuous pulse ox and blood pressure Approach: right paramedian Location: L3-4 Injection technique: single-shot Needle Needle type: Pencan  Needle gauge: 24 G Needle length: 9 cm Assessment Sensory level: T4 Additional Notes Expiration of kit checked and confirmed. Patient tolerated procedure well,without complications x 1 attempt with noted clear CSF. Loss of motor and sensory on exam post injection.

## 2016-08-15 NOTE — Anesthesia Postprocedure Evaluation (Addendum)
Anesthesia Post Note  Patient: Timothy Dickson  Procedure(s) Performed: Procedure(s) (LRB): RIGHT TOTAL KNEE ARTHROPLASTY, CORTISONE INJECTION OF LEFT KNEE (Right)  Patient location during evaluation: PACU Anesthesia Type: Spinal Level of consciousness: oriented and awake and alert Pain management: pain level controlled Vital Signs Assessment: post-procedure vital signs reviewed and stable Respiratory status: spontaneous breathing, respiratory function stable and patient connected to nasal cannula oxygen Cardiovascular status: blood pressure returned to baseline and stable Postop Assessment: no headache, no backache and spinal receding Anesthetic complications: no       Last Vitals:  Vitals:   08/15/16 1530 08/15/16 1556  BP: 126/84 125/77  Pulse: 60 (!) 58  Resp: 18 16  Temp: 36.4 C 36.4 C    Last Pain:  Vitals:   08/15/16 0945  TempSrc: Oral    LLE Motor Response: (P) Non-purposeful movement;No movement to painful stimulus;No movement due to regional block;No tremor (08/15/16 1605) LLE Sensation: (P) No sensation (absent);No numbness;No pain;No tingling (08/15/16 1605) RLE Motor Response: (P) Non-purposeful movement;No movement to painful stimulus;No movement due to regional block;No tremor (08/15/16 1605) RLE Sensation: (P) No sensation (absent);No numbness;No pain;No tingling (08/15/16 1605) L Sensory Level: (P) L1-Inguinal (groin) region (08/15/16 1605) R Sensory Level: (P) L4-Anterior knee, lower leg (08/15/16 1605)  Effie Berkshire

## 2016-08-15 NOTE — Progress Notes (Signed)
Assisted Dr. Hollis with right, ultrasound guided, adductor canal block. Side rails up, monitors on throughout procedure. See vital signs in flow sheet. Tolerated Procedure well.  

## 2016-08-16 ENCOUNTER — Encounter (HOSPITAL_COMMUNITY): Payer: Self-pay | Admitting: Orthopedic Surgery

## 2016-08-16 LAB — CBC
HCT: 38.7 % — ABNORMAL LOW (ref 39.0–52.0)
Hemoglobin: 13.3 g/dL (ref 13.0–17.0)
MCH: 31.3 pg (ref 26.0–34.0)
MCHC: 34.4 g/dL (ref 30.0–36.0)
MCV: 91.1 fL (ref 78.0–100.0)
Platelets: 205 10*3/uL (ref 150–400)
RBC: 4.25 MIL/uL (ref 4.22–5.81)
RDW: 12.9 % (ref 11.5–15.5)
WBC: 16.8 10*3/uL — ABNORMAL HIGH (ref 4.0–10.5)

## 2016-08-16 LAB — BASIC METABOLIC PANEL
Anion gap: 9 (ref 5–15)
BUN: 26 mg/dL — ABNORMAL HIGH (ref 6–20)
CO2: 26 mmol/L (ref 22–32)
Calcium: 8.9 mg/dL (ref 8.9–10.3)
Chloride: 103 mmol/L (ref 101–111)
Creatinine, Ser: 1.13 mg/dL (ref 0.61–1.24)
GFR calc Af Amer: >60 mL/min (ref >60)
GFR calc non Af Amer: >60 mL/min (ref >60)
Glucose, Bld: 180 mg/dL — ABNORMAL HIGH (ref 65–99)
Potassium: 3.8 mmol/L (ref 3.5–5.1)
Sodium: 138 mmol/L (ref 135–145)

## 2016-08-16 LAB — GLUCOSE, CAPILLARY
Glucose-Capillary: 153 mg/dL — ABNORMAL HIGH (ref 65–99)
Glucose-Capillary: 130 mg/dL — ABNORMAL HIGH (ref 65–99)
Glucose-Capillary: 120 mg/dL — ABNORMAL HIGH (ref 65–99)
Glucose-Capillary: 132 mg/dL — ABNORMAL HIGH (ref 65–99)

## 2016-08-16 MED ORDER — HYDROMORPHONE HCL 2 MG PO TABS
2.0000 mg | ORAL_TABLET | ORAL | Status: DC | PRN
Start: 2016-08-16 — End: 2016-08-17
  Administered 2016-08-16: 2 mg via ORAL
  Administered 2016-08-16 (×2): 4 mg via ORAL
  Administered 2016-08-16: 2 mg via ORAL
  Administered 2016-08-16 – 2016-08-17 (×5): 4 mg via ORAL
  Filled 2016-08-16 (×2): qty 2
  Filled 2016-08-16: qty 1
  Filled 2016-08-16 (×2): qty 2
  Filled 2016-08-16: qty 1
  Filled 2016-08-16 (×3): qty 2

## 2016-08-16 MED ORDER — HYDROMORPHONE HCL 2 MG PO TABS: 2.0000 mg | ORAL_TABLET | ORAL | 0 refills | Status: DC | PRN

## 2016-08-16 MED ORDER — ALUM & MAG HYDROXIDE-SIMETH 200-200-20 MG/5ML PO SUSP
30.0000 mL | ORAL | Status: DC | PRN
Start: 2016-08-16 — End: 2016-08-17

## 2016-08-16 MED ORDER — HYDROMORPHONE HCL 1 MG/ML IJ SOLN
INTRAMUSCULAR | Status: AC
  Filled 2016-08-16: qty 1

## 2016-08-16 MED ORDER — DIAZEPAM 5 MG PO TABS: 5.0000 mg | ORAL_TABLET | Freq: Four times a day (QID) | ORAL | 0 refills | Status: DC | PRN

## 2016-08-16 MED ORDER — HYDROMORPHONE HCL 1 MG/ML IJ SOLN
0.5000 mg | INTRAMUSCULAR | Status: DC | PRN
  Administered 2016-08-16: 0.5 mg via INTRAVENOUS
  Administered 2016-08-16: 1 mg via INTRAVENOUS
  Administered 2016-08-16: 0.5 mg via INTRAVENOUS
  Administered 2016-08-16 – 2016-08-17 (×3): 1 mg via INTRAVENOUS
  Filled 2016-08-16 (×5): qty 1

## 2016-08-16 MED ORDER — RIVAROXABAN 10 MG PO TABS: 10.0000 mg | ORAL_TABLET | Freq: Every day | ORAL | 0 refills | Status: DC

## 2016-08-16 NOTE — Progress Notes (Signed)
Physical Therapy Treatment Note    08/16/16 1500  PT Visit Information  Last PT Received On 08/16/16  Assistance Needed +1  History of Present Illness Pt is a 71 year old male s/p R TKA  Subjective Data  Subjective Pt ambulated in hallway again and mobilizing well.  Pt anticipates d/c home tomorrow.  Precautions  Precautions Knee  Restrictions  Other Position/Activity Restrictions WBAT  Pain Assessment  Pain Assessment 0-10  Pain Score 5  Pain Location R knee  Pain Descriptors / Indicators Aching;Sore  Pain Intervention(s) Monitored during session;Premedicated before session  Cognition  Arousal/Alertness Awake/alert  Behavior During Therapy WFL for tasks assessed/performed  Overall Cognitive Status Within Functional Limits for tasks assessed  Bed Mobility  General bed mobility comments Pt up in the bathroom on arrival  Transfers  Overall transfer level Needs assistance  Equipment used Rolling walker (2 wheeled)  Transfers Sit to/from Stand  Sit to Stand Supervision  Ambulation/Gait  Ambulation/Gait assistance Min guard;Supervision  Ambulation Distance (Feet) 160 Feet  Assistive device Rolling walker (2 wheeled)  Gait Pattern/deviations Step-to pattern;Decreased stance time - right;Antalgic  General Gait Details verbal cues for sequence, RW positioning, posture, step length  PT - End of Session  Activity Tolerance Patient tolerated treatment well  Patient left Other (comment) (in bathroom, aware to use pull cord if feeling poorly)  Nurse Communication Other (comment) (NT aware pt up in bathroom)  PT - Assessment/Plan  PT Plan Current plan remains appropriate  PT Frequency (ACUTE ONLY) 7X/week  Follow Up Recommendations Home health PT  PT equipment None recommended by PT  PT Goal Progression  Progress towards PT goals Progressing toward goals  PT Time Calculation  PT Start Time (ACUTE ONLY) 1418  PT Stop Time (ACUTE ONLY) 1432  PT Time Calculation (min) (ACUTE ONLY)  14 min  PT General Charges  $$ ACUTE PT VISIT 1 Procedure  PT Treatments  $Gait Training 8-22 mins   Carmelia Bake, PT, DPT 08/16/2016 Pager: [phone]

## 2016-08-16 NOTE — Progress Notes (Signed)
   Subjective: 1 Day Post-Op Procedure(s) (LRB): RIGHT TOTAL KNEE ARTHROPLASTY, CORTISONE INJECTION OF LEFT KNEE (Right) Patient reports pain as moderate.   Patient seen in rounds for Dr. Wynelle Dickson. Patient is having problems with pain in the knee, requiring pain medications We will start therapy today.  Plan is to go Home after hospital stay.  Objective: Vital signs in last 24 hours: Temp:  [97.7 F (36.5 C)-98.5 F (36.9 C)] 97.7 F (36.5 C) (01/16 1443) Pulse Rate:  [60-69] 65 (01/16 1443) Resp:  [18] 18 (01/16 1443) BP: (102-137)/(64-76) 137/76 (01/16 1443) SpO2:  [93 %-99 %] 94 % (01/16 1443)  Intake/Output from previous day:  Intake/Output Summary (Last 24 hours) at 08/16/16 2113 Last data filed at 08/16/16 1500  Gross per 24 hour  Intake              920 ml  Output           1469.5 ml  Net           -549.5 ml    Intake/Output this shift: No intake/output data recorded.  Labs:  Recent Labs  08/16/16 0421  HGB 13.3    Recent Labs  08/16/16 0421  WBC 16.8*  RBC 4.25  HCT 38.7*  PLT 205    Recent Labs  08/16/16 0421  NA 138  K 3.8  CL 103  CO2 26  BUN 26*  CREATININE 1.13  GLUCOSE 180*  CALCIUM 8.9   No results for input(s): LABPT, INR in the last 72 hours.  EXAM General - Patient is Alert, Appropriate and Oriented Extremity - Neurovascular intact Sensation intact distally Intact pulses distally Dorsiflexion/Plantar flexion intact Dressing - dressing C/D/I Motor Function - intact, moving foot and toes well on exam.  Hemovac pulled without difficulty.  Past Medical History:  Diagnosis Date  . Atrial tachycardia ([place])    documented by holter 2016. Followed by Dr. Liane Comber and Dr. Rayann Heman.  . 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 no further tx other than surgery [facility].  [name] Cirrhosis ([place])    [facility] dx, early stage- follow up visits at [facility].  [name] CRI (chronic renal insufficiency)    s/p R nephrectomy in 1999 for malignancy  .  Diabetes mellitus without complication ([facility])    NIDDM- dx. 3-4 years ago diet control  . Diverticulosis of colon    mild  . GERD (gastroesophageal reflux disease)   . Headache(784.0)   . Hepatitis C    s/p therapy [facility] (curative)/with [facility]  . History of BPH    no recent issues  . History of substance abuse    past history -none in 38 yrs  . Hyperglycemia   . Hypertension   . Osteoarthritis, knee    knees bilaterally   . Pneumonia 1975   hx of 35 years ago    Assessment/Plan: 1 Day Post-Op Procedure(s) (LRB): RIGHT TOTAL KNEE ARTHROPLASTY, CORTISONE INJECTION OF LEFT KNEE (Right) Principal Problem:   OA (osteoarthritis) of knee  Estimated body mass index is 28.87 kg/m as calculated from the following:   Height as of this encounter: 5\' 11"  (1.803 m).   Weight as of this encounter: 93.9 kg (207 lb). Advance diet Up with therapy Plan for discharge tomorrow  DVT Prophylaxis - Xarelto Weight-Bearing as tolerated to right leg D/C O2 and Pulse OX and try on Room Air  Arlee Muslim, PA-C Orthopaedic Surgery 08/16/2016, 9:13 PM

## 2016-08-16 NOTE — Discharge Instructions (Addendum)
° °Dr. Frank Aluisio °Total Joint Specialist °[facility] °[address] °[place], [place] [address] °[phone] ° °TOTAL KNEE REPLACEMENT POSTOPERATIVE DIRECTIONS ° °Knee Rehabilitation, Guidelines Following Surgery  °Results after knee surgery are often greatly improved when you follow the exercise, range of motion and muscle strengthening exercises prescribed by your doctor. Safety measures are also important to protect the knee from further injury. Any time any of these exercises cause you to have increased pain or swelling in your knee joint, decrease the amount until you are comfortable again and slowly increase them. If you have problems or questions, call your caregiver or physical therapist for advice.  ° °HOME CARE INSTRUCTIONS  °Remove items at home which could result in a fall. This includes throw rugs or furniture in walking pathways.  °· ICE to the affected knee every three hours for 30 minutes at a time and then as needed for pain and swelling.  Continue to use ice on the knee for pain and swelling from surgery. You may notice swelling that will progress down to the foot and ankle.  This is normal after surgery.  Elevate the leg when you are not up walking on it.   °· Continue to use the breathing machine which will help keep your temperature down.  It is common for your temperature to cycle up and down following surgery, especially at night when you are not up moving around and exerting yourself.  The breathing machine keeps your lungs expanded and your temperature down. °· Do not place pillow under knee, focus on keeping the knee straight while resting ° °DIET °You may resume your previous home diet once your are discharged from the hospital. ° °DRESSING / WOUND CARE / SHOWERING °You may shower 3 days after surgery, but keep the wounds dry during showering.  You may use an occlusive plastic wrap (Press'n Seal for example), NO SOAKING/SUBMERGING IN THE BATHTUB.  If the  bandage gets wet, change with a clean dry gauze.  If the incision gets wet, pat the wound dry with a clean towel. °You may start showering once you are discharged home but do not submerge the incision under water. Just pat the incision dry and apply a dry gauze dressing on daily. °Change the surgical dressing daily and reapply a dry dressing each time. ° °ACTIVITY °Walk with your walker as instructed. °Use walker as long as suggested by your caregivers. °Avoid periods of inactivity such as sitting longer than an hour when not asleep. This helps prevent blood clots.  °You may resume a sexual relationship in one month or when given the OK by your doctor.  °You may return to work once you are cleared by your doctor.  °Do not drive a car for 6 weeks or until released by you surgeon.  °Do not drive while taking narcotics. ° °WEIGHT BEARING °Weight bearing as tolerated with assist device (walker, cane, etc) as directed, use it as long as suggested by your surgeon or therapist, typically at least 4-6 weeks. ° °POSTOPERATIVE CONSTIPATION PROTOCOL °Constipation - defined medically as fewer than three stools per week and severe constipation as less than one stool per week. ° °One of the most common issues patients have following surgery is constipation.  Even if you have a regular bowel pattern at home, your normal regimen is likely to be disrupted due to multiple reasons following surgery.  Combination of anesthesia, postoperative narcotics, change in appetite and fluid intake all can affect your bowels.    In order to avoid complications following surgery, here are some recommendations in order to help you during your recovery period. ° °Colace (docusate) - Pick up an over-the-counter form of Colace or another stool softener and take twice a day as long as you are requiring postoperative pain medications.  Take with a full glass of water daily.  If you experience loose stools or diarrhea, hold the colace until you stool forms  back up.  If your symptoms do not get better within 1 week or if they get worse, check with your doctor. ° °Dulcolax (bisacodyl) - Pick up over-the-counter and take as directed by the product packaging as needed to assist with the movement of your bowels.  Take with a full glass of water.  Use this product as needed if not relieved by Colace only.  ° °MiraLax (polyethylene glycol) - Pick up over-the-counter to have on hand.  MiraLax is a solution that will increase the amount of water in your bowels to assist with bowel movements.  Take as directed and can mix with a glass of water, juice, soda, coffee, or tea.  Take if you go more than two days without a movement. °Do not use MiraLax more than once per day. Call your doctor if you are still constipated or irregular after using this medication for 7 days in a row. ° °If you continue to have problems with postoperative constipation, please contact the office for further assistance and recommendations.  If you experience "the worst abdominal pain ever" or develop nausea or vomiting, please contact the office immediatly for further recommendations for treatment. ° °ITCHING ° If you experience itching with your medications, try taking only a single pain pill, or even half a pain pill at a time.  You can also use Benadryl over the counter for itching or also to help with sleep.  ° °TED HOSE STOCKINGS °Wear the elastic stockings on both legs for three weeks following surgery during the day but you may remove then at night for sleeping. ° °MEDICATIONS °See your medication summary on the “After Visit Summary” that the nursing staff will review with you prior to discharge.  You may have some home medications which will be placed on hold until you complete the course of blood thinner medication.  It is important for you to complete the blood thinner medication as prescribed by your surgeon.  Continue your approved medications as instructed at time of  discharge. ° °PRECAUTIONS °If you experience chest pain or shortness of breath - call 911 immediately for transfer to the hospital emergency department.  °If you develop a fever greater that 101 F, purulent drainage from wound, increased redness or drainage from wound, foul odor from the wound/dressing, or calf pain - CONTACT YOUR SURGEON.   °                                                °FOLLOW-UP APPOINTMENTS °Make sure you keep all of your appointments after your operation with your surgeon and caregivers. You should call the office at the above phone number and make an appointment for approximately two weeks after the date of your surgery or on the date instructed by your surgeon outlined in the "After Visit Summary". ° ° °RANGE OF MOTION AND STRENGTHENING EXERCISES  °Rehabilitation of the knee is important following a knee injury or   an operation. After just a few days of immobilization, the muscles of the thigh which control the knee become weakened and shrink (atrophy). Knee exercises are designed to build up the tone and strength of the thigh muscles and to improve knee motion. Often times heat used for twenty to thirty minutes before working out will loosen up your tissues and help with improving the range of motion but do not use heat for the first two weeks following surgery. These exercises can be done on a training (exercise) mat, on the floor, on a table or on a bed. Use what ever works the best and is most comfortable for you Knee exercises include:  °Leg Lifts - While your knee is still immobilized in a splint or cast, you can do straight leg raises. Lift the leg to 60 degrees, hold for 3 sec, and slowly lower the leg. Repeat 10-20 times 2-3 times daily. Perform this exercise against resistance later as your knee gets better.  °Quad and Hamstring Sets - Tighten up the muscle on the front of the thigh (Quad) and hold for 5-10 sec. Repeat this 10-20 times hourly. Hamstring sets are done by pushing the  foot backward against an object and holding for 5-10 sec. Repeat as with quad sets.  °· Leg Slides: Lying on your back, slowly slide your foot toward your buttocks, bending your knee up off the floor (only go as far as is comfortable). Then slowly slide your foot back down until your leg is flat on the floor again. °· Angel Wings: Lying on your back spread your legs to the side as far apart as you can without causing discomfort.  °A rehabilitation program following serious knee injuries can speed recovery and prevent re-injury in the future due to weakened muscles. Contact your doctor or a physical therapist for more information on knee rehabilitation.  ° °IF YOU ARE TRANSFERRED TO A SKILLED REHAB FACILITY °If the patient is transferred to a skilled rehab facility following release from the hospital, a list of the current medications will be sent to the facility for the patient to continue.  When discharged from the skilled rehab facility, please have the facility set up the patient's Home Health Physical Therapy prior to being released. Also, the skilled facility will be responsible for providing the patient with their medications at time of release from the facility to include their pain medication, the muscle relaxants, and their blood thinner medication. If the patient is still at the rehab facility at time of the two week follow up appointment, the skilled rehab facility will also need to assist the patient in arranging follow up appointment in our office and any transportation needs. ° °MAKE SURE YOU:  °Understand these instructions.  °Get help right away if you are not doing well or get worse.  ° ° °Pick up stool softner and laxative for home use following surgery while on pain medications. °Do not submerge incision under water. °Please use good hand washing techniques while changing dressing each day. °May shower starting three days after surgery. °Please use a clean towel to pat the incision dry following  showers. °Continue to use ice for pain and swelling after surgery. °Do not use any lotions or creams on the incision until instructed by your surgeon. ° °Take Xarelto for two and a half more weeks, then discontinue Xarelto. °Once the patient has completed the blood thinner regimen, then take a Baby 81 mg Aspirin daily for three more weeks. ° ° ° °  Information on my medicine - XARELTO (Rivaroxaban)  This medication education was reviewed with me or my healthcare representative as part of my discharge preparation.  The pharmacist that spoke with me during my hospital stay was:  Modena Morrow, PharmD Candidate  Why was Xarelto prescribed for you? Xarelto was prescribed for you to reduce the risk of blood clots forming after orthopedic surgery. The medical term for these abnormal blood clots is venous thromboembolism (VTE).  What do you need to know about xarelto ? Take your Xarelto ONCE DAILY at the same time every day. You may take it either with or without food.  If you have difficulty swallowing the tablet whole, you may crush it and mix in applesauce just prior to taking your dose.  Take Xarelto exactly as prescribed by your doctor and DO NOT stop taking Xarelto without talking to the doctor who prescribed the medication.  Stopping without other VTE prevention medication to take the place of Xarelto may increase your risk of developing a clot.  After discharge, you should have regular check-up appointments with your healthcare provider that is prescribing your Xarelto.    What do you do if you miss a dose? If you miss a dose, take it as soon as you remember on the same day then continue your regularly scheduled once daily regimen the next day. Do not take two doses of Xarelto on the same day.   Important Safety Information A possible side effect of Xarelto is bleeding. You should call your healthcare provider right away if you experience any of the following: ? Bleeding from an  injury or your nose that does not stop. ? Unusual colored urine (red or dark brown) or unusual colored stools (red or black). ? Unusual bruising for unknown reasons. ? A serious fall or if you hit your head (even if there is no bleeding).  Some medicines may interact with Xarelto and might increase your risk of bleeding while on Xarelto. To help avoid this, consult your healthcare provider or pharmacist prior to using any new prescription or non-prescription medications, including herbals, vitamins, non-steroidal anti-inflammatory drugs (NSAIDs) and supplements.  This website has more information on Xarelto: [email or web address]

## 2016-08-16 NOTE — Discharge Summary (Signed)
Physician Discharge Summary   Patient ID: Timothy Dickson MRN: [id number] DOB/AGE: 71/06/1946 71 y.o.  Admit date: 08/15/2016 Discharge date: 08-17-2016  Primary Diagnosis:  Osteoarthritis  Bilateral knee(s) Admission Diagnoses:  Past Medical History:  Diagnosis Date  . Atrial tachycardia ([place])    documented by holter 2016. Followed by Dr. Liane Comber and Dr. Rayann Heman.  . 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 no further tx other than surgery [facility].  [name] Cirrhosis ([place])    [facility] dx, early stage- follow up visits at [facility].  [name] CRI (chronic renal insufficiency)    s/p R nephrectomy in 1999 for malignancy  . Diabetes mellitus without complication (H[facility])    NIDDM- dx. 3-4 years ago diet control  . Diverticulosis of colon    mild  . GERD (gastroesophageal reflux disease)   . Headache(784.0)   . Hepatitis C    s/p therapy [facility] (curative)/with Harv[facility]  . History of BPH    no recent issues  . History of substance abuse    past history -none in 38 yrs  . Hyperglycemia   . Hypertension   . Osteoarthritis, knee    knees bilaterally   . Pneumonia 1975   hx of 35 years ago   Discharge Diagnoses:   Principal Problem:   OA (osteoarthritis) of knee  Estimated body mass index is 28.87 kg/m as calculated from the following:   Height as of this encounter: '5\' 11"'$  (1.803 m).   Weight as of this encounter: 93.9 kg (207 lb).  Procedure:  Procedure(s) (LRB): RIGHT TOTAL KNEE ARTHROPLASTY, CORTISONE INJECTION OF LEFT KNEE (Right)   Consults: None  HPI: Timothy Dickson is a 71 y.o. year old male with end stage OA of his right knee with progressively worsening pain and dysfunction. He has constant pain, with activity and at rest and significant functional deficits with difficulties even with ADLs. He has had extensive non-op management including analgesics, injections of cortisone and viscosupplements, and home exercise program, but remains in significant pain with significant dysfunction.  Radiographs show bone on bone arthritis medial and patellofemoral. He presents now for right Total Knee Arthroplasty.  He also has advanced OA left knee and requests cortisone injection  Laboratory Data: Admission on 08/15/2016  Component Date Value Ref Range Status  . Glucose-Capillary 08/15/2016 146* 65 - 99 mg/dL Final  . Comment 1 08/15/2016 Notify RN   Final  . Glucose-Capillary 08/15/2016 114* 65 - 99 mg/dL Final  . Glucose-Capillary 08/15/2016 160* 65 - 99 mg/dL Final  . WBC 08/16/2016 16.8* 4.0 - 10.5 K/uL Final  . RBC 08/16/2016 4.25  4.22 - 5.81 MIL/uL Final  . Hemoglobin 08/16/2016 13.3  13.0 - 17.0 g/dL Final  . HCT 08/16/2016 38.7* 39.0 - 52.0 % Final  . MCV 08/16/2016 91.1  78.0 - 100.0 fL Final  . MCH 08/16/2016 31.3  26.0 - 34.0 pg Final  . MCHC 08/16/2016 34.4  30.0 - 36.0 g/dL Final  . RDW 08/16/2016 12.9  11.5 - 15.5 % Final  . Platelets 08/16/2016 205  150 - 400 K/uL Final  . Sodium 08/16/2016 138  135 - 145 mmol/L Final  . Potassium 08/16/2016 3.8  3.5 - 5.1 mmol/L Final  . Chloride 08/16/2016 103  101 - 111 mmol/L Final  . CO2 08/16/2016 26  22 - 32 mmol/L Final  . Glucose, Bld 08/16/2016 180* 65 - 99 mg/dL Final  . BUN 08/16/2016 26* 6 - 20 mg/dL Final  . Creatinine, Ser 08/16/2016 1.13  0.61 - 1.24 mg/dL Final  . Calcium 08/16/2016 8.9  8.9 - 10.3 mg/dL Final  . GFR calc non Af Amer 08/16/2016 >60  >60 mL/min Final  . GFR calc Af Amer 08/16/2016 >60  >60 mL/min Final   Comment: (NOTE) The eGFR has been calculated using the CKD EPI equation. This calculation has not been validated in all clinical situations. eGFR's persistently <60 mL/min signify possible Chronic Kidney Disease.   . Anion gap 08/16/2016 9  5 - 15 Final  . Glucose-Capillary 08/15/2016 208* 65 - 99 mg/dL Final  . Glucose-Capillary 08/16/2016 153* 65 - 99 mg/dL Final  . Glucose-Capillary 08/16/2016 130* 65 - 99 mg/dL Final  . Glucose-Capillary 08/16/2016 120* 65 - 99 mg/dL Final  Hospital  Outpatient Visit on 08/04/2016  Component Date Value Ref Range Status  . Glucose-Capillary 08/04/2016 213* 65 - 99 mg/dL Final  . MRSA, PCR 08/04/2016 NEGATIVE  NEGATIVE Final  . Staphylococcus aureus 08/04/2016 POSITIVE* NEGATIVE Final   Comment:        The Xpert SA Assay (FDA approved for NASAL specimens in patients over 28 years of age), is one component of a comprehensive surveillance program.  Test performance has been validated by [facility] for patients greater than or equal to 71 year old. It is not intended to diagnose infection nor to guide or monitor treatment.   [name] aPTT 08/04/2016 29  24 - 36 seconds Final  . WBC 08/04/2016 7.6  4.0 - 10.5 K/uL Final  . RBC 08/04/2016 4.90  4.22 - 5.81 MIL/uL Final  . Hemoglobin 08/04/2016 14.8  13.0 - 17.0 g/dL Final  . HCT 08/04/2016 43.6  39.0 - 52.0 % Final  . MCV 08/04/2016 89.0  78.0 - 100.0 fL Final  . MCH 08/04/2016 30.2  26.0 - 34.0 pg Final  . MCHC 08/04/2016 33.9  30.0 - 36.0 g/dL Final  . RDW 08/04/2016 12.6  11.5 - 15.5 % Final  . Platelets 08/04/2016 193  150 - 400 K/uL Final  . Sodium 08/04/2016 134* 135 - 145 mmol/L Final  . Potassium 08/04/2016 3.9  3.5 - 5.1 mmol/L Final  . Chloride 08/04/2016 98* 101 - 111 mmol/L Final  . CO2 08/04/2016 29  22 - 32 mmol/L Final  . Glucose, Bld 08/04/2016 142* 65 - 99 mg/dL Final  . BUN 08/04/2016 19  6 - 20 mg/dL Final  . Creatinine, Ser 08/04/2016 1.15  0.61 - 1.24 mg/dL Final  . Calcium 08/04/2016 9.5  8.9 - 10.3 mg/dL Final  . Total Protein 08/04/2016 6.9  6.5 - 8.1 g/dL Final  . Albumin 08/04/2016 4.6  3.5 - 5.0 g/dL Final  . AST 08/04/2016 30  15 - 41 U/L Final  . ALT 08/04/2016 19  17 - 63 U/L Final  . Alkaline Phosphatase 08/04/2016 43  38 - 126 U/L Final  . Total Bilirubin 08/04/2016 0.7  0.3 - 1.2 mg/dL Final  . GFR calc non Af Amer 08/04/2016 >60  >60 mL/min Final  . GFR calc Af Amer 08/04/2016 >60  >60 mL/min Final   Comment: (NOTE) The eGFR has been calculated  using the CKD EPI equation. This calculation has not been validated in all clinical situations. eGFR's persistently <60 mL/min signify possible Chronic Kidney Disease.   . Anion gap 08/04/2016 7  5 - 15 Final  . Prothrombin Time 08/04/2016 13.2  11.4 - 15.2 seconds Final  . INR 08/04/2016 1.00   Final  . ABO/RH(D) 08/04/2016 A POS   Final  .  Antibody Screen 08/04/2016 NEG   Final  . Sample Expiration 08/04/2016 08/18/2016   Final  . Extend sample reason 08/04/2016 NO TRANSFUSIONS OR PREGNANCY IN THE PAST 3 MONTHS   Final  . Color, Urine 08/04/2016 YELLOW  YELLOW Final  . APPearance 08/04/2016 CLEAR  CLEAR Final  . Specific Gravity, Urine 08/04/2016 1.014  1.005 - 1.030 Final  . pH 08/04/2016 6.0  5.0 - 8.0 Final  . Glucose, UA 08/04/2016 NEGATIVE  NEGATIVE mg/dL Final  . Hgb urine dipstick 08/04/2016 NEGATIVE  NEGATIVE Final  . Bilirubin Urine 08/04/2016 NEGATIVE  NEGATIVE Final  . Ketones, ur 08/04/2016 NEGATIVE  NEGATIVE mg/dL Final  . Protein, ur 08/04/2016 NEGATIVE  NEGATIVE mg/dL Final  . Nitrite 08/04/2016 NEGATIVE  NEGATIVE Final  . Leukocytes, UA 08/04/2016 NEGATIVE  NEGATIVE Final  . Hgb A1c MFr Bld 08/05/2016 6.5* 4.8 - 5.6 % Final   Comment: (NOTE)         Pre-diabetes: 5.7 - 6.4         Diabetes: >6.4         Glycemic control for adults with diabetes: <7.0   . Mean Plasma Glucose 08/05/2016 140  mg/dL Final   Comment: (NOTE) Performed At: [facility] [place], [place] [id number] Lindon Romp MD MV:[id number]   . ABO/RH(D) 08/04/2016 A POS   Final     X-Rays:No results found.  EKG: Orders placed or performed in visit on 08/05/16  . EKG 12-Lead     Hospital Course: Rad Gramling is a 71 y.o. who was admitted to [facility]. They were brought to the operating room on 08/15/2016 and underwent Procedure(s): RIGHT TOTAL KNEE ARTHROPLASTY, CORTISONE INJECTION OF LEFT KNEE.  Patient tolerated the procedure well and was later  transferred to the recovery room and then to the orthopaedic floor for postoperative care.  They were given PO and IV analgesics for pain control following their surgery.  They were given 24 hours of postoperative antibiotics of  Anti-infectives    Start     Dose/Rate Route Frequency Ordered Stop   08/15/16 2000  ceFAZolin (ANCEF) IVPB 2g/100 mL premix     2 g 200 mL/hr over 30 Minutes Intravenous Every 6 hours 08/15/16 1551 08/16/16 0237   08/15/16 0940  ceFAZolin (ANCEF) IVPB 2g/100 mL premix     2 g 200 mL/hr over 30 Minutes Intravenous On call to O.R. 08/15/16 0940 08/15/16 1330     and started on DVT prophylaxis in the form of Xarelto.   PT and OT were ordered for total joint protocol.  Discharge planning consulted to help with postop disposition and equipment needs.  Patient had a tough night on the evening of surgery.  They started to get up OOB with therapy on day one. Hemovac drain was pulled without difficulty.  Continued to work with therapy into day two.  Dressing was changed on day two and the incision was healing well.  Patient was seen in rounds on POD 2 and was ready to go home.   Diet - Cardiac diet, Diabetic diet and Renal diet Follow up - in 2 weeks Activity - WBAT Disposition - Home Condition Upon Discharge - Good D/C Meds - See DC Summary DVT Prophylaxis - Xarelto  Discharge Instructions    Call MD / Call 911    Complete by:  As directed    If you experience chest pain or shortness of breath, CALL 911 and be transported to the hospital emergency  room.  If you develope a fever above 101 F, pus (white drainage) or increased drainage or redness at the wound, or calf pain, call your surgeon's office.   Change dressing    Complete by:  As directed    Change dressing daily with sterile 4 x 4 inch gauze dressing and apply TED hose. Do not submerge the incision under water.   Constipation Prevention    Complete by:  As directed    Drink plenty of fluids.  Prune juice may be  helpful.  You may use a stool softener, such as Colace (over the counter) 100 mg twice a day.  Use MiraLax (over the counter) for constipation as needed.   Diet - low sodium heart healthy    Complete by:  As directed    Discharge instructions    Complete by:  As directed    Pick up stool softner and laxative for home use following surgery while on pain medications. Do not submerge incision under water. Please use good hand washing techniques while changing dressing each day. May shower starting three days after surgery. Please use a clean towel to pat the incision dry following showers. Continue to use ice for pain and swelling after surgery. Do not use any lotions or creams on the incision until instructed by your surgeon.  Wear both TED hose on both legs during the day every day for three weeks, but may have off at night at home.  Postoperative Constipation Protocol  Constipation - defined medically as fewer than three stools per week and severe constipation as less than one stool per week.  One of the most common issues patients have following surgery is constipation.  Even if you have a regular bowel pattern at home, your normal regimen is likely to be disrupted due to multiple reasons following surgery.  Combination of anesthesia, postoperative narcotics, change in appetite and fluid intake all can affect your bowels.  In order to avoid complications following surgery, here are some recommendations in order to help you during your recovery period.  Colace (docusate) - Pick up an over-the-counter form of Colace or another stool softener and take twice a day as long as you are requiring postoperative pain medications.  Take with a full glass of water daily.  If you experience loose stools or diarrhea, hold the colace until you stool forms back up.  If your symptoms do not get better within 1 week or if they get worse, check with your doctor.  Dulcolax (bisacodyl) - Pick up over-the-counter  and take as directed by the product packaging as needed to assist with the movement of your bowels.  Take with a full glass of water.  Use this product as needed if not relieved by Colace only.   MiraLax (polyethylene glycol) - Pick up over-the-counter to have on hand.  MiraLax is a solution that will increase the amount of water in your bowels to assist with bowel movements.  Take as directed and can mix with a glass of water, juice, soda, coffee, or tea.  Take if you go more than two days without a movement. Do not use MiraLax more than once per day. Call your doctor if you are still constipated or irregular after using this medication for 7 days in a row.  If you continue to have problems with postoperative constipation, please contact the office for further assistance and recommendations.  If you experience "the worst abdominal pain ever" or develop nausea or vomiting, please contact  the office immediatly for further recommendations for treatment.   Take Xarelto for two and a half more weeks, then discontinue Xarelto. Once the patient has completed the blood thinner regimen, then take a Baby 81 mg Aspirin daily for three more weeks.   Do not put a pillow under the knee. Place it under the heel.    Complete by:  As directed    Do not sit on low chairs, stoools or toilet seats, as it may be difficult to get up from low surfaces    Complete by:  As directed    Driving restrictions    Complete by:  As directed    No driving until released by the physician.   Increase activity slowly as tolerated    Complete by:  As directed    Lifting restrictions    Complete by:  As directed    No lifting until released by the physician.   Patient may shower    Complete by:  As directed    You may shower without a dressing once there is no drainage.  Do not wash over the wound.  If drainage remains, do not shower until drainage stops.   TED hose    Complete by:  As directed    Use stockings (TED hose) for  3 weeks on both leg(s).  You may remove them at night for sleeping.   Weight bearing as tolerated    Complete by:  As directed    Laterality:  right   Extremity:  Lower     Allergies as of 08/16/2016      Reactions   Statins    Due to cirrhosis    Aspirin Other (See Comments)   Pt has only 1 kidney - been told to stay away from Aspirin       Medication List    STOP taking these medications   ALEVE 220 MG tablet Generic drug:  naproxen sodium   B-complex with vitamin C tablet     TAKE these medications   carvedilol 12.5 MG tablet Commonly known as:  COREG Take 1 tablet (12.5 mg total) by mouth 2 (two) times daily with a meal.   diazepam 5 MG tablet Commonly known as:  VALIUM Take 1 tablet (5 mg total) by mouth every 6 (six) hours as needed for muscle spasms.   ezetimibe 10 MG tablet Commonly known as:  ZETIA Take 1 tablet (10 mg total) by mouth daily.   flecainide 50 MG tablet Commonly known as:  TAMBOCOR Take 1 tablet (50 mg total) by mouth 3 (three) times daily.   hydrochlorothiazide 25 MG tablet Commonly known as:  HYDRODIURIL Take 1 tablet (25 mg total) by mouth daily.   HYDROmorphone 2 MG tablet Commonly known as:  DILAUDID Take 1-2 tablets (2-4 mg total) by mouth every 4 (four) hours as needed for moderate pain or severe pain.   omeprazole 20 MG tablet Commonly known as:  PRILOSEC OTC Take 10 mg by mouth 2 (two) times a week.   rivaroxaban 10 MG Tabs tablet Commonly known as:  XARELTO Take 1 tablet (10 mg total) by mouth daily with breakfast. Take Xarelto for two and a half more weeks, then discontinue Xarelto. Once the patient has completed the blood thinner regimen, then take a Baby 81 mg Aspirin daily for three more weeks. Start taking on:  08/17/2016   sodium chloride 0.65 % Soln nasal spray Commonly known as:  OCEAN Place 1 spray into both nostrils every  morning.      Follow-up Information    [facility] Follow up.   Specialty:  Home  Health Services Why:  home health physical therapy Contact information: [address] [place] [address] [phone]        Gearlean Alf, MD. Schedule an appointment as soon as possible for a visit on 08/30/2016.   Specialty:  Orthopedic Surgery Contact information: [address] [place] [address] [phone]           Signed: Arlee Muslim, PA-C Orthopaedic Surgery 08/16/2016, 9:19 PM

## 2016-08-16 NOTE — Evaluation (Signed)
Occupational Therapy Evaluation Patient Details Name: Timothy Dickson MRN: [id number] DOB: 07/13/1946 Today's Date: 08/16/2016    History of Present Illness Pt is a 71 year old male s/p R TKA   Clinical Impression   Pt with decline in function and safety with ADLs and ADL mobility with decreased endurance and balance. Pt would benefit from acute OT services to address impairments to increase level of function and safety    Follow Up Recommendations  Supervision - Intermittent;No OT follow up    Equipment Recommendations  3 in 1 bedside commode    Recommendations for Other Services       Precautions / Restrictions Precautions Precautions: Knee Precaution Comments: able to perform SLR Required Braces or Orthoses: Knee Immobilizer - Right Restrictions Weight Bearing Restrictions: No Other Position/Activity Restrictions: WBAT      Mobility Bed Mobility Overal bed mobility: Needs Assistance Bed Mobility: Supine to Sit     Supine to sit: Supervision     General bed mobility comments: pt up in recliner  Transfers Overall transfer level: Needs assistance Equipment used: Rolling walker (2 wheeled) Transfers: Sit to/from Stand Sit to Stand: Min guard         General transfer comment: verbal cues for UE and LE positioning    Balance Overall balance assessment: Needs assistance   Sitting balance-Leahy Scale: Good     Standing balance support: During functional activity Standing balance-Leahy Scale: Fair                              ADL Overall ADL's : Needs assistance/impaired     Grooming: Wash/dry hands;Wash/dry face;Min guard;Standing   Upper Body Bathing: Set up;Sitting   Lower Body Bathing: Minimal assistance;With caregiver independent assisting   Upper Body Dressing : Set up;Sitting   Lower Body Dressing: Minimal assistance;With caregiver independent assisting   Toilet Transfer: Ambulation;RW;Comfort height toilet;Min guard;Cueing  for safety   Toileting- Clothing Manipulation and Hygiene: Sit to/from stand;Min guard;With caregiver independent assisting   Tub/ Shower Transfer: Min guard;3 in 1;Ambulation;Rolling walker;Cueing for safety   Functional mobility during ADLs: Min guard;Cueing for safety       Vision Vision Assessment?: No apparent visual deficits              Pertinent Vitals/Pain Pain Assessment: 0-10 Pain Score: 3  Pain Location: R knee Pain Descriptors / Indicators: Aching;Sore Pain Intervention(s): Limited activity within patient's tolerance;Monitored during session;Premedicated before session;Repositioned     Hand Dominance Right   Extremity/Trunk Assessment Upper Extremity Assessment Upper Extremity Assessment: Overall WFL for tasks assessed   Lower Extremity Assessment Lower Extremity Assessment: Defer to PT evaluation RLE Deficits / Details: able to perform SLR, knee flexion AAROM 85*   Cervical / Trunk Assessment Cervical / Trunk Assessment: Normal   Communication Communication Communication: No difficulties   Cognition Arousal/Alertness: Awake/alert Behavior During Therapy: WFL for tasks assessed/performed Overall Cognitive Status: Within Functional Limits for tasks assessed                     General Comments   pt/family pleasant and cooperative                 Home Living Family/patient expects to be discharged to:: Private residence Living Arrangements: Spouse/significant other Available Help at Discharge: Family Type of Home: House Home Access: Stairs to enter Technical brewer of Steps: 3 Entrance Stairs-Rails: None Home Layout: Able to live on main level  with bedroom/bathroom     Bathroom Shower/Tub: Tub/shower unit;Walk-in shower   Bathroom Toilet: Handicapped height     Home Equipment: Environmental consultant - 2 wheels;Shower seat;Adaptive equipment Adaptive Equipment: Reacher        Prior Functioning/Environment Level of Independence:  Independent                 OT Problem List: Decreased activity tolerance;Impaired balance (sitting and/or standing);Pain;Decreased knowledge of use of DME or AE   OT Treatment/Interventions: Self-care/ADL training;DME and/or AE instruction;Therapeutic activities;Patient/family education    OT Goals(Current goals can be found in the care plan section) Acute Rehab OT Goals Patient Stated Goal: go home tomorrow OT Goal Formulation: With patient/family Time For Goal Achievement: 08/23/16 ADL Goals Pt Will Perform Grooming: with supervision;with set-up;standing Pt Will Perform Lower Body Bathing: with min guard assist;with supervision;with caregiver independent in assisting;sitting/lateral leans;sit to/from stand Pt Will Perform Lower Body Dressing: with min guard assist;with supervision;with caregiver independent in assisting;sitting/lateral leans;sit to/from stand Pt Will Transfer to Toilet: with supervision;with modified independence;ambulating Pt Will Perform Toileting - Clothing Manipulation and hygiene: with supervision;with caregiver independent in assisting;sit to/from stand Pt Will Perform Tub/Shower Transfer: with supervision;with caregiver independent in assisting;ambulating;rolling walker;3 in 1;shower seat  OT Frequency: Min 2X/week   Barriers to D/C:    no barriers                     End of Session Equipment Utilized During Treatment: Rolling walker;Other (comment);Gait belt (3 in 1) CPM Right Knee CPM Right Knee: Off  Activity Tolerance: Patient tolerated treatment well Patient left: in chair;with call bell/phone within reach;with family/visitor present   Time: [id number] OT Time Calculation (min): 25 min Charges:  OT General Charges $OT Visit: 1 Procedure OT Evaluation $OT Eval Moderate Complexity: 1 Procedure OT Treatments $Therapeutic Activity: 8-22 mins G-Codes:    Britt Bottom 08/16/2016, 2:07 PM

## 2016-08-16 NOTE — Evaluation (Signed)
Physical Therapy Evaluation Patient Details Name: Timothy Dickson MRN: [id number] DOB: [birth year] Today's Date: 08/16/2016   History of Present Illness  Pt is a 71 year old male s/p R TKA  Clinical Impression  Pt is s/p TKA resulting in the deficits listed below (see PT Problem List).  Pt will benefit from skilled PT to increase their independence and safety with mobility to allow discharge to the venue listed below.  Pt tolerating ambulation well POD #1 and performed LE exercises.  Pt hopeful to d/c home with spouse tomorrow.     Follow Up Recommendations Home health PT    Equipment Recommendations  None recommended by PT    Recommendations for Other Services       Precautions / Restrictions Precautions Precautions: Knee Precaution Comments: able to perform SLR Required Braces or Orthoses: Knee Immobilizer - Right Restrictions Other Position/Activity Restrictions: WBAT      Mobility  Bed Mobility Overal bed mobility: Needs Assistance Bed Mobility: Supine to Sit     Supine to sit: Supervision        Transfers Overall transfer level: Needs assistance Equipment used: Rolling walker (2 wheeled) Transfers: Sit to/from Stand Sit to Stand: Min guard         General transfer comment: verbal cues for UE and LE positioning  Ambulation/Gait Ambulation/Gait assistance: Min guard Ambulation Distance (Feet): 120 Feet Assistive device: Rolling walker (2 wheeled) Gait Pattern/deviations: Step-to pattern;Decreased stance time - right;Antalgic     General Gait Details: verbal cues for sequence, RW positioning, posture, step length  Stairs            Wheelchair Mobility    Modified Rankin (Stroke Patients Only)       Balance                                             Pertinent Vitals/Pain Pain Assessment: 0-10 Pain Score: 3  Pain Location: R knee Pain Descriptors / Indicators: Aching;Sore Pain Intervention(s): Limited activity within  patient's tolerance;Monitored during session;Repositioned;Premedicated before session    [place] expects to be discharged to:: Private residence Living Arrangements: Spouse/significant other   Type of Home: House Home Access: Stairs to enter Entrance Stairs-Rails: None Entrance Stairs-Number of Steps: 3 Home Layout: Able to live on main level with bedroom/bathroom Home Equipment: Environmental consultant - 2 wheels      Prior Function Level of Independence: Independent               Hand Dominance        Extremity/Trunk Assessment        Lower Extremity Assessment Lower Extremity Assessment: RLE deficits/detail RLE Deficits / Details: able to perform SLR, knee flexion AAROM 85*       Communication   Communication: No difficulties  Cognition Arousal/Alertness: Awake/alert Behavior During Therapy: WFL for tasks assessed/performed Overall Cognitive Status: Within Functional Limits for tasks assessed                      General Comments      Exercises Total Joint Exercises Ankle Circles/Pumps: AROM;Both;10 reps Quad Sets: AROM;Both;10 reps Short Arc Quad: AROM;Right;10 reps Heel Slides: AAROM;Right;10 reps Hip ABduction/ADduction: AROM;Right;10 reps Straight Leg Raises: AROM;Right;10 reps   Assessment/Plan    PT Assessment Patient needs continued PT services  PT Problem List Decreased strength;Decreased range of motion;Decreased knowledge of use of  DME;Pain;Decreased mobility          PT Treatment Interventions Functional mobility training;Stair training;Gait training;DME instruction;Therapeutic exercise;Therapeutic activities;Patient/family education    PT Goals (Current goals can be found in the Care Plan section)  Acute Rehab PT Goals PT Goal Formulation: With patient Time For Goal Achievement: 08/20/16 Potential to Achieve Goals: Good    Frequency 7X/week   Barriers to discharge        Co-evaluation               End of  Session Equipment Utilized During Treatment: Gait belt Activity Tolerance: Patient tolerated treatment well Patient left: in chair;with call bell/phone within reach;with family/visitor present           Time: [id number] PT Time Calculation (min) (ACUTE ONLY): 26 min   Charges:   PT Evaluation $PT Eval Low Complexity: 1 Procedure PT Treatments $Therapeutic Exercise: 8-22 mins   PT G Codes:        [name],KATHrine E 08/16/2016, 12:36 PM Carmelia Bake, PT, DPT 08/16/2016 Pager: [phone]

## 2016-08-16 NOTE — Care Management Note (Signed)
Case Management Note  Patient Details  Name: Timothy Dickson MRN: [id number] Date of Birth: 1946/03/25  Subjective/Objective:                  RIGHT TOTAL KNEE ARTHROPLASTY, CORTISONE INJECTION OF LEFT KNEE (Right) Action/Plan: Discharge planning Expected Discharge Date:  08/17/16               Expected Discharge Plan:  [place]  In-House Referral:     Discharge planning Services  CM Consult  Post Acute Care Choice:  Home Health Choice offered to:     DME Arranged:  N/A DME Agency:  NA  HH Arranged:  PT [place] Agency:  [facility]  Status of Service:  Completed, signed off  If discussed at [employer], dates discussed:    Additional Comments: Cm met with pt in room to offer choice of home health agency. Pt chooses [facility] to render HHPT. Ref[facility] called to [facility] rep, Tim. Pt states he has all DME needed at home. No other CM needs were communicated. Dellie Catholic, RN 08/16/2016, 1:15 PM

## 2016-08-17 LAB — CBC
HCT: 35 % — ABNORMAL LOW (ref 39.0–52.0)
Hemoglobin: 11.9 g/dL — ABNORMAL LOW (ref 13.0–17.0)
MCH: 30.9 pg (ref 26.0–34.0)
MCHC: 34 g/dL (ref 30.0–36.0)
MCV: 90.9 fL (ref 78.0–100.0)
Platelets: 194 10*3/uL (ref 150–400)
RBC: 3.85 MIL/uL — ABNORMAL LOW (ref 4.22–5.81)
RDW: 13 % (ref 11.5–15.5)
WBC: 15.2 10*3/uL — ABNORMAL HIGH (ref 4.0–10.5)

## 2016-08-17 LAB — BASIC METABOLIC PANEL
Anion gap: 7 (ref 5–15)
BUN: 32 mg/dL — ABNORMAL HIGH (ref 6–20)
CO2: 29 mmol/L (ref 22–32)
Calcium: 9 mg/dL (ref 8.9–10.3)
Chloride: 98 mmol/L — ABNORMAL LOW (ref 101–111)
Creatinine, Ser: 0.9 mg/dL (ref 0.61–1.24)
GFR calc Af Amer: >60 mL/min (ref >60)
GFR calc non Af Amer: >60 mL/min (ref >60)
Glucose, Bld: 155 mg/dL — ABNORMAL HIGH (ref 65–99)
Potassium: 3.7 mmol/L (ref 3.5–5.1)
Sodium: 134 mmol/L — ABNORMAL LOW (ref 135–145)

## 2016-08-17 LAB — GLUCOSE, CAPILLARY: Glucose-Capillary: 140 mg/dL — ABNORMAL HIGH (ref 65–99)

## 2016-08-17 NOTE — Progress Notes (Signed)
OT Cancellation Note  Patient Details Name: Timothy Dickson MRN: [id number] DOB: 1946-02-01   Cancelled Treatment:    Reason Eval/Treat Not Completed: Other (comment).  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  Reinforced readiness for tub transfer.  No further OT needs at this time  [facility] 08/17/2016, 8:50 AM  Lesle Chris, OTR/L [phone] 08/17/2016

## 2016-08-17 NOTE — Progress Notes (Signed)
Physical Therapy Treatment Patient Details Name: Timothy Dickson MRN: [id number] DOB: 23-Mar-1946 Today's Date: 08/17/2016    History of Present Illness Pt is a 71 year old male s/p R TKA    PT Comments    Pt reports feeling more stiff and sore today.  Pt ambulated in hallway, practiced safe stair technique and performed LE exercises.  Provided HEP handout.  Pt and spouse had no further questions, and pt to d/c home today.   Follow Up Recommendations  Home health PT     Equipment Recommendations  None recommended by PT    Recommendations for Other Services       Precautions / Restrictions Precautions Precautions: Knee Restrictions Other Position/Activity Restrictions: WBAT    Mobility  Bed Mobility               General bed mobility comments: Pt up in recliner on arrival  Transfers Overall transfer level: Needs assistance Equipment used: Rolling walker (2 wheeled) Transfers: Sit to/from Stand Sit to Stand: Supervision         General transfer comment: verbal cues for UE and LE positioning  Ambulation/Gait Ambulation/Gait assistance: Supervision;Min guard Ambulation Distance (Feet): 40 Feet Assistive device: Rolling walker (2 wheeled) Gait Pattern/deviations: Step-to pattern;Decreased stance time - right;Antalgic     General Gait Details: verbal cues for sequence, RW positioning, posture, step length   Stairs Stairs: Yes   Stair Management: Step to pattern;Backwards;With walker Number of Stairs: 2 General stair comments: verbal cues for sequence, safety, RW positioning, spouse assisted with RW and cues (She has hx of TKA and familar with technique)  Wheelchair Mobility    Modified Rankin (Stroke Patients Only)       Balance                                    Cognition Arousal/Alertness: Awake/alert Behavior During Therapy: WFL for tasks assessed/performed Overall Cognitive Status: Within Functional Limits for tasks assessed                       Exercises Total Joint Exercises Ankle Circles/Pumps: AROM;Both;10 reps Quad Sets: AROM;Both;10 reps Short Arc Quad: Right;10 reps;AAROM Heel Slides: AAROM;Right;10 reps Hip ABduction/ADduction: AROM;Right;10 reps Straight Leg Raises: Right;10 reps;AAROM    General Comments        Pertinent Vitals/Pain Pain Assessment: 0-10 Pain Score: 5  Pain Location: R knee Pain Descriptors / Indicators: Aching;Sore Pain Intervention(s): Limited activity within patient's tolerance;Monitored during session;Repositioned    Home Living                      Prior Function            PT Goals (current goals can now be found in the care plan section) Progress towards PT goals: Progressing toward goals    Frequency    7X/week      PT Plan Current plan remains appropriate    Co-evaluation             End of Session Equipment Utilized During Treatment: Gait belt Activity Tolerance: Patient tolerated treatment well Patient left: in chair;with call bell/phone within reach;with family/visitor present     Time: [id number] PT Time Calculation (min) (ACUTE ONLY): 23 min  Charges:  $Gait Training: 8-22 mins $Therapeutic Exercise: 8-22 mins  G Codes:      Britny Riel,KATHrine E 09/01/16, 12:57 PM Carmelia Bake, PT, DPT Sep 01, 2016 Pager: [phone]

## 2016-08-17 NOTE — Progress Notes (Signed)
Subjective: 2 Days Post-Op Procedure(s) (LRB): RIGHT TOTAL KNEE ARTHROPLASTY, CORTISONE INJECTION OF LEFT KNEE (Right) Patient reports pain as mild.   Patient seen in rounds by Dr. Wynelle Link. Patient is well, but has had some minor complaints of pain in the knee, requiring pain medications Patient is ready to go home  Objective: Vital signs in last 24 hours: Temp:  [97.5 F (36.4 C)-98.4 F (36.9 C)] 98.4 F (36.9 C) (01/17 0527) Pulse Rate:  [65-77] 77 (01/17 0527) Resp:  [18] 18 (01/17 0527) BP: (109-137)/(76-90) 123/76 (01/17 0527) SpO2:  [94 %-97 %] 95 % (01/17 0527)  Intake/Output from previous day:  Intake/Output Summary (Last 24 hours) at 08/17/16 0729 Last data filed at 08/17/16 0700  Gross per 24 hour  Intake              700 ml  Output             1070 ml  Net             -370 ml    Intake/Output this shift: No intake/output data recorded.  Labs:  Recent Labs  08/16/16 0421 08/17/16 0416  HGB 13.3 11.9*    Recent Labs  08/16/16 0421 08/17/16 0416  WBC 16.8* 15.2*  RBC 4.25 3.85*  HCT 38.7* 35.0*  PLT 205 194    Recent Labs  08/16/16 0421 08/17/16 0416  NA 138 134*  K 3.8 3.7  CL 103 98*  CO2 26 29  BUN 26* 32*  CREATININE 1.13 0.90  GLUCOSE 180* 155*  CALCIUM 8.9 9.0   No results for input(s): LABPT, INR in the last 72 hours.  EXAM: General - Patient is Alert, Appropriate and Oriented Extremity - Neurovascular intact Sensation intact distally Intact pulses distally Dorsiflexion/Plantar flexion intact Incision - clean, dry, no drainage Motor Function - intact, moving foot and toes well on exam.   Assessment/Plan: 2 Days Post-Op Procedure(s) (LRB): RIGHT TOTAL KNEE ARTHROPLASTY, CORTISONE INJECTION OF LEFT KNEE (Right) Procedure(s) (LRB): RIGHT TOTAL KNEE ARTHROPLASTY, CORTISONE INJECTION OF LEFT KNEE (Right) Past Medical History:  Diagnosis Date  . Atrial tachycardia ([place])    documented by holter 2016. Followed by Dr.  Liane Comber and Dr. Rayann Heman.  . 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 no further tx other than surgery [facility].  [name] Cirrhosis ([place])    [facility] dx, early stage- follow up visits at [facility].  [name] CRI (chronic renal insufficiency)    s/p R nephrectomy in 1999 for malignancy  . Diabetes mellitus without complication (HCC)    NIDDM- dx. 3-4 years ago diet control  . Diverticulosis of colon    mild  . GERD (gastroesophageal reflux disease)   . Headache(784.0)   . Hepatitis C    s/p therapy [facility] (curative)/with Harvoni  . History of BPH    no recent issues  . History of substance abuse    past history -none in 38 yrs  . Hyperglycemia   . Hypertension   . Osteoarthritis, knee    knees bilaterally   . Pneumonia 1975   hx of 35 years ago   Principal Problem:   OA (osteoarthritis) of knee  Estimated body mass index is 28.87 kg/m as calculated from the following:   Height as of this encounter: 5\' 11"  (1.803 m).   Weight as of this encounter: 93.9 kg (207 lb). Up with therapy Diet - Cardiac diet, Diabetic diet and Renal diet Follow up - in 2 weeks Activity - WBAT Disposition -  Home Condition Upon Discharge - Good D/C Meds - See DC Summary DVT Prophylaxis - Xarelto  Arlee Muslim, PA-C Orthopaedic Surgery 08/17/2016, 7:29 AM

## 2016-08-17 NOTE — Progress Notes (Signed)
RN reviewed discharge instructions with patient and family. All questions answered.   Paperwork and prescriptions given.   NT rolled patient down with all belongings with family car.

## 2016-08-19 DIAGNOSIS — K7460 Unspecified cirrhosis of liver: Secondary | ICD-10-CM | POA: Diagnosis not present

## 2016-08-19 DIAGNOSIS — G5623 Lesion of ulnar nerve, bilateral upper limbs: Secondary | ICD-10-CM | POA: Diagnosis not present

## 2016-08-19 DIAGNOSIS — E1129 Type 2 diabetes mellitus with other diabetic kidney complication: Secondary | ICD-10-CM | POA: Diagnosis not present

## 2016-08-19 DIAGNOSIS — G8929 Other chronic pain: Secondary | ICD-10-CM | POA: Diagnosis not present

## 2016-08-19 DIAGNOSIS — M1712 Unilateral primary osteoarthritis, left knee: Secondary | ICD-10-CM | POA: Diagnosis not present

## 2016-08-19 DIAGNOSIS — Z471 Aftercare following joint replacement surgery: Secondary | ICD-10-CM | POA: Diagnosis not present

## 2016-08-21 ENCOUNTER — Encounter (HOSPITAL_COMMUNITY): Payer: Self-pay | Admitting: *Deleted

## 2016-08-21 ENCOUNTER — Emergency Department (HOSPITAL_COMMUNITY)
Admission: EM | Admit: 2016-08-21 | Discharge: 2016-08-21 | Disposition: A | Discharge status: Home / Self Care | Payer: Medicare Other | Attending: Emergency Medicine | Admitting: Emergency Medicine

## 2016-08-21 DIAGNOSIS — Z85528 Personal history of other malignant neoplasm of kidney: Secondary | ICD-10-CM | POA: Insufficient documentation

## 2016-08-21 DIAGNOSIS — Z87891 Personal history of nicotine dependence: Secondary | ICD-10-CM | POA: Insufficient documentation

## 2016-08-21 DIAGNOSIS — Z7901 Long term (current) use of anticoagulants: Secondary | ICD-10-CM | POA: Diagnosis not present

## 2016-08-21 DIAGNOSIS — R066 Hiccough: Secondary | ICD-10-CM | POA: Diagnosis not present

## 2016-08-21 DIAGNOSIS — Z96651 Presence of right artificial knee joint: Secondary | ICD-10-CM | POA: Insufficient documentation

## 2016-08-21 DIAGNOSIS — E119 Type 2 diabetes mellitus without complications: Secondary | ICD-10-CM | POA: Diagnosis not present

## 2016-08-21 DIAGNOSIS — T83198A Other mechanical complication of other urinary devices and implants, initial encounter: Secondary | ICD-10-CM | POA: Diagnosis not present

## 2016-08-21 DIAGNOSIS — I10 Essential (primary) hypertension: Secondary | ICD-10-CM | POA: Insufficient documentation

## 2016-08-21 DIAGNOSIS — T8189XA Other complications of procedures, not elsewhere classified, initial encounter: Secondary | ICD-10-CM | POA: Diagnosis not present

## 2016-08-21 LAB — BASIC METABOLIC PANEL
Anion gap: 7 (ref 5–15)
BUN: 20 mg/dL (ref 6–20)
CO2: 33 mmol/L — ABNORMAL HIGH (ref 22–32)
Calcium: 8.1 mg/dL — ABNORMAL LOW (ref 8.9–10.3)
Chloride: 89 mmol/L — ABNORMAL LOW (ref 101–111)
Creatinine, Ser: 0.8 mg/dL (ref 0.61–1.24)
GFR calc Af Amer: >60 mL/min (ref >60)
GFR calc non Af Amer: >60 mL/min (ref >60)
Glucose, Bld: 179 mg/dL — ABNORMAL HIGH (ref 65–99)
Potassium: 3.3 mmol/L — ABNORMAL LOW (ref 3.5–5.1)
Sodium: 129 mmol/L — ABNORMAL LOW (ref 135–145)

## 2016-08-21 LAB — URINALYSIS, ROUTINE W REFLEX MICROSCOPIC
Bilirubin Urine: NEGATIVE
Glucose, UA: NEGATIVE mg/dL
Hgb urine dipstick: NEGATIVE
Ketones, ur: NEGATIVE mg/dL
Leukocytes, UA: NEGATIVE
Nitrite: NEGATIVE
Protein, ur: NEGATIVE mg/dL
Specific Gravity, Urine: 1.016 (ref 1.005–1.030)
pH: 7 (ref 5.0–8.0)

## 2016-08-21 LAB — CBC
HCT: 28.6 % — ABNORMAL LOW (ref 39.0–52.0)
Hemoglobin: 10 g/dL — ABNORMAL LOW (ref 13.0–17.0)
MCH: 30.9 pg (ref 26.0–34.0)
MCHC: 35 g/dL (ref 30.0–36.0)
MCV: 88.3 fL (ref 78.0–100.0)
Platelets: 223 10*3/uL (ref 150–400)
RBC: 3.24 MIL/uL — ABNORMAL LOW (ref 4.22–5.81)
RDW: 12.4 % (ref 11.5–15.5)
WBC: 10.2 10*3/uL (ref 4.0–10.5)

## 2016-08-21 MED ORDER — BACLOFEN 10 MG PO TABS
20.0000 mg | ORAL_TABLET | Freq: Once | ORAL | Status: AC
  Administered 2016-08-21: 20 mg via ORAL
  Filled 2016-08-21: qty 2

## 2016-08-21 MED ORDER — METOCLOPRAMIDE HCL 5 MG/ML IJ SOLN
10.0000 mg | Freq: Once | INTRAMUSCULAR | Status: AC
  Administered 2016-08-21: 10 mg via INTRAVENOUS
  Filled 2016-08-21: qty 2

## 2016-08-21 MED ORDER — CHLORPROMAZINE HCL 25 MG PO TABS
25.0000 mg | ORAL_TABLET | Freq: Once | ORAL | Status: AC
  Administered 2016-08-21: 25 mg via ORAL
  Filled 2016-08-21: qty 1

## 2016-08-21 MED ORDER — FAMOTIDINE 20 MG PO TABS
20.0000 mg | ORAL_TABLET | Freq: Once | ORAL | Status: AC
  Administered 2016-08-21: 20 mg via ORAL
  Filled 2016-08-21: qty 1

## 2016-08-21 MED ORDER — BACLOFEN 20 MG PO TABS: 20.0000 mg | ORAL_TABLET | Freq: Three times a day (TID) | ORAL | 0 refills | Status: DC

## 2016-08-21 MED ORDER — HYDROMORPHONE HCL 2 MG PO TABS: 2.0000 mg | ORAL_TABLET | ORAL | 0 refills | Status: DC | PRN

## 2016-08-21 MED ORDER — HYDROMORPHONE HCL 1 MG/ML IJ SOLN
1.0000 mg | Freq: Once | INTRAMUSCULAR | Status: AC
  Administered 2016-08-21: 1 mg via INTRAVENOUS
  Filled 2016-08-21: qty 1

## 2016-08-21 MED ORDER — GI COCKTAIL ~~LOC~~
30.0000 mL | Freq: Once | ORAL | Status: AC
  Administered 2016-08-21: 30 mL via ORAL
  Filled 2016-08-21: qty 30

## 2016-08-21 NOTE — ED Notes (Signed)
Patient states that he has had hiccups since Wednesday with increasing severity and is wanting help to stop them

## 2016-08-21 NOTE — ED Triage Notes (Signed)
Patient is alert and oriented x4.  He is being sent from [place] to be evaluated for weakness and pale color.  Patient recently had knee surgery on Monday.  On Wednesday he started to have a pale look to his skin color and today was sent to the ED per PCP.

## 2016-08-21 NOTE — Discharge Instructions (Signed)
Return here as needed.  Follow-up with your primary care doctor °

## 2016-08-21 NOTE — ED Provider Notes (Signed)
[facility] DEPT Provider Note   CSN: [id number] Arrival date & time: 08/21/16  1106     History   Chief Complaint Chief Complaint  Patient presents with  . Hiccups    HPI Timothy Dickson is a 71 y.o. male.  HPI Patient presents to the emergency department with headache, upset about ongoing since Wednesday.  The patient had recent knee replacement surgery last Monday.  He states that certain except early Wednesday, and they continued since that time.  The patient states that he tried home remedies without relief of his symptoms.  Patient states that nothing seems make the condition better or worse. The patient denies chest pain, shortness of breath, headache,blurred vision, neck pain, fever, cough, weakness, numbness, dizziness, anorexia, edema, abdominal pain, nausea, vomiting, diarrhea, rash, back pain, dysuria, hematemesis, bloody stool, near syncope, or syncope. Past Medical History:  Diagnosis Date  . Atrial tachycardia ([place])    documented by holter 2016. Followed by Dr. Liane Comber and Dr. Rayann Heman.  . 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 no further tx other than surgery [facility].  [name] Cirrhosis ([place])    [facility] dx, early stage- [facility].  [name] CRI (chronic renal insufficiency)    s/p R nephrectomy in 1999 for malignancy  . Diabetes mellitus without complication (HCC)    NIDDM- dx. 3-4 years ago diet control  . Diverticulosis of colon    mild  . GERD (gastroesophageal reflux disease)   . Headache(784.0)   . Hepatitis C    s/p therapy [facility] (curative)/with Harvoni  . History of BPH    no recent issues  . History of substance abuse    past history -none in 38 yrs  . Hyperglycemia   . Hypertension   . Osteoarthritis, knee    knees bilaterally   . Pneumonia 1975   hx of 35 years ago    Patient Active Problem List   Diagnosis Date Noted  . AI (aortic insufficiency) 12/21/2015  . Ascending aortic aneurysm ([place]) 12/21/2015  . Acquired absence of kidney  04/16/2015  . Personal history of other malignant neoplasm of kidney 04/16/2015  . Mixed, or nondependent drug abuse, in remission 04/16/2015  . Hepatic cirrhosis ([place]) 01/09/2015  . 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 SVT (supraventricular tachycardia) ([place]) 10/13/2014  . Premature atrial complexes 10/13/2014  . Ulnar neuropathy of both upper extremities 10/13/2014  . Atrial complex, premature 10/13/2014  . Atrial tachycardia, paroxysmal ([place]) 09/09/2014  . Supraventricular tachycardia ([place]) 09/09/2014  . Carpal tunnel syndrome 08/29/2014  . Otitis, externa, infective 08/29/2014  . Cirrhosis of liver without ascites ([place]) 08/29/2014  . Chronic hepatitis C virus infection ([place]) 08/29/2014  . Hyperlipidemia 07/01/2014  . Chronic hepatitis C ([place]) 01/27/2014  . Personal history of other infectious and parasitic diseases 01/27/2014  . Hyperlipidemia with target LDL less than 100 10/28/2013  . Combined fat and carbohydrate induced hyperlipemia 10/28/2013  . Paroxysmal supraventricular tachycardia ([place]) 05/22/2013  . Essential hypertension, benign 10/04/2012  . Essential (primary) hypertension 10/04/2012  . Chronic low back pain 05/22/2012  . LBP (low back pain) 05/22/2012  . Routine health maintenance 03/21/2011  . CARCINOMA, RENAL CELL 10/26/2007  . OSTEOARTHRITIS, KNEE 10/26/2007  . DM (diabetes mellitus), type 2 with renal complications ([place]) [id number]  . OA (osteoarthritis) of knee 10/26/2007  . Type 2 diabetes mellitus ([place]) 10/26/2007    Past Surgical History:  Procedure Laterality Date  . Arthroscopy right knee  may '11 (Dr Alvan Dame)  . Arthroscopy, left knee,    . history of liver biopsy  2007  . History of Nephrectomy Right 1999  . KNEE ARTHROSCOPY Left 10/10/2012   Procedure: LEFT KNEE ARTHROSCOPY WITH MENSCIAL DEBRIDEMENT AND CHONDROPLASTY;  Surgeon: Gearlean Alf, MD;  Location: [facility] ORS;  Service: Orthopedics;  Laterality: Left;  . left shoulder  repair for bone spur  1990's  . TOTAL KNEE ARTHROPLASTY Right 08/15/2016   Procedure: RIGHT TOTAL KNEE ARTHROPLASTY, CORTISONE INJECTION OF LEFT KNEE;  Surgeon: Gaynelle Arabian, MD;  Location: [facility] ORS;  Service: Orthopedics;  Laterality: Right;  . Transurethral needle ablation procedure    . TUNA procedure         Home Medications    Prior to Admission medications   Medication Sig Start Date End Date Taking? Authorizing Provider  carvedilol (COREG) 12.5 MG tablet Take 1 tablet (12.5 mg total) by mouth 2 (two) times daily with a meal. 12/21/15   Dorothy Spark, MD  diazepam (VALIUM) 5 MG tablet Take 1 tablet (5 mg total) by mouth every 6 (six) hours as needed for muscle spasms. 08/16/16   Alexzandrew L Perkins, PA-C  ezetimibe (ZETIA) 10 MG tablet Take 1 tablet (10 mg total) by mouth daily. 01/15/16   Dorothy Spark, MD  flecainide (TAMBOCOR) 50 MG tablet Take 1 tablet (50 mg total) by mouth 3 (three) times daily. 05/19/16   Dorothy Spark, MD  hydrochlorothiazide (HYDRODIURIL) 25 MG tablet Take 1 tablet (25 mg total) by mouth daily. 12/21/15   Dorothy Spark, MD  HYDROmorphone (DILAUDID) 2 MG tablet Take 1-2 tablets (2-4 mg total) by mouth every 4 (four) hours as needed for moderate pain or severe pain. 08/16/16   Alexzandrew L Perkins, PA-C  omeprazole (PRILOSEC OTC) 20 MG tablet Take 10 mg by mouth 2 (two) times a week.    Historical Provider, MD  rivaroxaban (XARELTO) 10 MG TABS tablet Take 1 tablet (10 mg total) by mouth daily with breakfast. Take Xarelto for two and a half more weeks, then discontinue Xarelto. Once the patient has completed the blood thinner regimen, then take a Baby 81 mg Aspirin daily for three more weeks. 08/17/16   Alexzandrew L Perkins, PA-C  sodium chloride (OCEAN) 0.65 % SOLN nasal spray Place 1 spray into both nostrils every morning.    Historical Provider, MD    Family History Family History  Problem Relation Age of Onset  . Coronary artery disease Mother    . CVA Mother   . Prostate cancer Brother   . Alcohol abuse Father     variceal hemorrhage  . Alcohol abuse Sister   . Alcohol abuse Other     Strong family Hx  . Diabetes Sister   . Breast cancer Sister   . Lung cancer Sister   . Esophageal cancer Sister   . Cancer Brother     head and neck    Social History Social History  Substance Use Topics  . Smoking status: Former Smoker    Packs/day: 1.00    Years: 15.00    Types: Cigarettes    Quit date: 08/01/1977  . Smokeless tobacco: Never Used  . Alcohol use No     Comment: past history -none in 38 yrs     Allergies   Statins and Aspirin   Review of Systems Review of Systems All other systems negative except as documented in the HPI. All pertinent positives and negatives as reviewed in the  HPI.  Physical Exam Updated Vital Signs BP 104/68   Pulse 75   Temp 98 F (36.7 C) (Oral)   Resp 16   Ht 5\' 11"  (1.803 m)   Wt 93.9 kg   SpO2 96%   BMI 28.87 kg/m   Physical Exam  Constitutional: He is oriented to person, place, and time. He appears well-developed and well-nourished. No distress.  HENT:  Head: Normocephalic and atraumatic.  Mouth/Throat: Oropharynx is clear and moist.  Eyes: Pupils are equal, round, and reactive to light.  Neck: Normal range of motion. Neck supple.  Cardiovascular: Normal rate, regular rhythm and normal heart sounds.  Exam reveals no gallop and no friction rub.   No murmur heard. Pulmonary/Chest: Effort normal and breath sounds normal. No respiratory distress. He has no wheezes.  Abdominal: Soft. Bowel sounds are normal. He exhibits no distension. There is no tenderness.  Neurological: He is alert and oriented to person, place, and time. He exhibits normal muscle tone. Coordination normal.  Skin: Skin is warm and dry. No rash noted. No erythema.  Psychiatric: He has a normal mood and affect. His behavior is normal.  Nursing note and vitals reviewed.    ED Treatments / Results   Labs (all labs ordered are listed, but only abnormal results are displayed) Labs Reviewed  BASIC METABOLIC PANEL - Abnormal; Notable for the following:       Result Value   Sodium 129 (*)    Potassium 3.3 (*)    Chloride 89 (*)    CO2 33 (*)    Glucose, Bld 179 (*)    Calcium 8.1 (*)    All other components within normal limits  CBC - Abnormal; Notable for the following:    RBC 3.24 (*)    Hemoglobin 10.0 (*)    HCT 28.6 (*)    All other components within normal limits  URINALYSIS, ROUTINE W REFLEX MICROSCOPIC    EKG  EKG Interpretation None       Radiology No results found.  Procedures Procedures (including critical care time)  Medications Ordered in ED Medications  baclofen (LIORESAL) tablet 20 mg (not administered)  chlorproMAZINE (THORAZINE) tablet 25 mg (25 mg Oral Given 08/21/16 1304)  famotidine (PEPCID) tablet 20 mg (20 mg Oral Given 08/21/16 1304)  gi cocktail (Maalox,Lidocaine,Donnatal) (30 mLs Oral Given 08/21/16 1304)  metoCLOPramide (REGLAN) injection 10 mg (10 mg Intravenous Given 08/21/16 1405)  HYDROmorphone (DILAUDID) injection 1 mg (1 mg Intravenous Given 08/21/16 1405)     Initial Impression / Assessment and Plan / ED Course  I have reviewed the triage vital signs and the nursing notes.  Pertinent labs & imaging results that were available during my care of the patient were reviewed by me and considered in my medical decision making (see chart for details).     Gave the patient Thorazine with no success in reducing his hiccups also gave the patient Reglan, which did not seem to slow hiccups.  The patient is also given GI cocktail which did not relieve the symptoms.  Will use baclofen as another agent to help with his hiccups Final Clinical Impressions(s) / ED Diagnoses   Final diagnoses:  None    New Prescriptions New Prescriptions   No medications on file     Dalia Heading, PA-C 08/21/16 [place], MD 08/22/16  [phone]

## 2016-08-31 DIAGNOSIS — M25561 Pain in right knee: Secondary | ICD-10-CM | POA: Diagnosis not present

## 2016-09-01 DIAGNOSIS — Z1211 Encounter for screening for malignant neoplasm of colon: Secondary | ICD-10-CM | POA: Diagnosis not present

## 2016-09-02 DIAGNOSIS — M25561 Pain in right knee: Secondary | ICD-10-CM | POA: Diagnosis not present

## 2016-09-05 DIAGNOSIS — M25561 Pain in right knee: Secondary | ICD-10-CM | POA: Diagnosis not present

## 2016-09-08 DIAGNOSIS — M25561 Pain in right knee: Secondary | ICD-10-CM | POA: Diagnosis not present

## 2016-09-12 DIAGNOSIS — M25561 Pain in right knee: Secondary | ICD-10-CM | POA: Diagnosis not present

## 2016-09-19 DIAGNOSIS — M25561 Pain in right knee: Secondary | ICD-10-CM | POA: Diagnosis not present

## 2016-09-21 DIAGNOSIS — J069 Acute upper respiratory infection, unspecified: Secondary | ICD-10-CM | POA: Diagnosis not present

## 2016-09-21 DIAGNOSIS — R52 Pain, unspecified: Secondary | ICD-10-CM | POA: Diagnosis not present

## 2016-09-22 DIAGNOSIS — Z471 Aftercare following joint replacement surgery: Secondary | ICD-10-CM | POA: Diagnosis not present

## 2016-09-22 DIAGNOSIS — Z96651 Presence of right artificial knee joint: Secondary | ICD-10-CM | POA: Diagnosis not present

## 2016-09-28 DIAGNOSIS — R05 Cough: Secondary | ICD-10-CM | POA: Diagnosis not present

## 2016-10-20 DIAGNOSIS — M1712 Unilateral primary osteoarthritis, left knee: Secondary | ICD-10-CM | POA: Diagnosis not present

## 2016-10-27 DIAGNOSIS — Z471 Aftercare following joint replacement surgery: Secondary | ICD-10-CM | POA: Diagnosis not present

## 2016-10-27 DIAGNOSIS — Z96651 Presence of right artificial knee joint: Secondary | ICD-10-CM | POA: Diagnosis not present

## 2016-11-10 DIAGNOSIS — I10 Essential (primary) hypertension: Secondary | ICD-10-CM | POA: Diagnosis not present

## 2016-11-10 DIAGNOSIS — E119 Type 2 diabetes mellitus without complications: Secondary | ICD-10-CM | POA: Diagnosis not present

## 2016-11-10 DIAGNOSIS — E7800 Pure hypercholesterolemia, unspecified: Secondary | ICD-10-CM | POA: Diagnosis not present

## 2016-11-10 DIAGNOSIS — I480 Paroxysmal atrial fibrillation: Secondary | ICD-10-CM | POA: Diagnosis not present

## 2016-12-23 ENCOUNTER — Other Ambulatory Visit: Payer: Self-pay | Admitting: Cardiology

## 2016-12-27 DIAGNOSIS — K7460 Unspecified cirrhosis of liver: Secondary | ICD-10-CM | POA: Diagnosis not present

## 2016-12-30 NOTE — Addendum Note (Signed)
Addendum  created 12/30/16 0904 by Effie Berkshire, MD   Sign clinical note

## 2017-01-01 ENCOUNTER — Other Ambulatory Visit: Payer: Self-pay | Admitting: Cardiology

## 2017-01-23 ENCOUNTER — Encounter: Payer: Self-pay | Admitting: Cardiology

## 2017-01-23 ENCOUNTER — Ambulatory Visit (INDEPENDENT_AMBULATORY_CARE_PROVIDER_SITE_OTHER): Payer: Medicare Other | Admitting: Cardiology

## 2017-01-23 VITALS — BP 120/72 | HR 69 | Ht 71.0 in | Wt 201.0 lb

## 2017-01-23 DIAGNOSIS — I471 Supraventricular tachycardia: Secondary | ICD-10-CM

## 2017-01-23 DIAGNOSIS — I712 Thoracic aortic aneurysm, without rupture: Secondary | ICD-10-CM | POA: Diagnosis not present

## 2017-01-23 DIAGNOSIS — R011 Cardiac murmur, unspecified: Secondary | ICD-10-CM | POA: Diagnosis not present

## 2017-01-23 DIAGNOSIS — I10 Essential (primary) hypertension: Secondary | ICD-10-CM

## 2017-01-23 DIAGNOSIS — E782 Mixed hyperlipidemia: Secondary | ICD-10-CM | POA: Diagnosis not present

## 2017-01-23 DIAGNOSIS — I7121 Aneurysm of the ascending aorta, without rupture: Secondary | ICD-10-CM

## 2017-01-23 DIAGNOSIS — I351 Nonrheumatic aortic (valve) insufficiency: Secondary | ICD-10-CM

## 2017-01-23 MED ORDER — EZETIMIBE 10 MG PO TABS: 10.0000 mg | ORAL_TABLET | Freq: Every day | ORAL | 3 refills | Status: DC

## 2017-01-23 MED ORDER — CARVEDILOL 12.5 MG PO TABS: 12.5000 mg | ORAL_TABLET | Freq: Two times a day (BID) | ORAL | 3 refills | Status: DC

## 2017-01-23 MED ORDER — HYDROCHLOROTHIAZIDE 25 MG PO TABS: 25.0000 mg | ORAL_TABLET | Freq: Every day | ORAL | 3 refills | Status: DC

## 2017-01-23 MED ORDER — FLECAINIDE ACETATE 50 MG PO TABS: 50.0000 mg | ORAL_TABLET | Freq: Three times a day (TID) | ORAL | 3 refills | Status: DC

## 2017-01-23 NOTE — Addendum Note (Signed)
Addended by: Nuala Alpha on: 01/23/2017 10:57 AM   Modules accepted: Orders

## 2017-01-23 NOTE — Patient Instructions (Signed)
Medication Instructions:   Your physician recommends that you continue on your current medications as directed. Please refer to the Current Medication list given to you today.   Labwork:  BMET NEEDS TO BE DONE A FEW DAYS BEFORE YOUR CARDIAC MRI AND MRA CHEST W CONTRAST--THIS IS PROTOCOL.      Testing/Procedures:  1.  CARDIAC MRI WITH DR Meda Coffee TO READ--THIS IS FOR MURMUR AND AORTIC INSUFFICIENCY    2.  MRA OF CHEST WITH CONTRAST---THIS IS FOR KNOWN ASCENDING AORTIC ANEURYSM      Follow-Up:  Your physician wants you to follow-up in: [place] will receive a reminder letter in the mail two months in advance. If you don't receive a letter, please call our office to schedule the follow-up appointment.        If you need a refill on your cardiac medications before your next appointment, please call your pharmacy.

## 2017-01-23 NOTE — Progress Notes (Signed)
Electrophysiology Office Note   Date:  01/23/2017   ID:  Timothy Dickson, DOB 1945-09-05, MRN [id number]  PCP:  Antony Contras, MD  Cardiologist:  Dr Meda Coffee Primary Electrophysiologist: Ena Dawley, MD    Chief complain: 1 year follow up   History of Present Illness: Timothy Dickson is a 71 y.o. male who presents today for electrophysiology evaluation.   He has a h/o PACs, PVCs, and SVT.  He has done very well over the past year without any symptoms of arrhythmia.  He is pleased with flecainide.  He has knee surgery planned.  01/23/2017 - this is a 1 year follow-up, the patient states that he has been feeling great with occasional palpitations that can be lasting 5 seconds per day. No associated chest pain dizziness or shortness of breath. He has been compliant with his medications. He saw Dr. Rayann Heman in January were recommended to decrease flecainide to 100 mg a day, this was working for him for about 4 months but then he developed more frequent palpitations and be advised to increase 150 mg a day in 3 doses.  He denies any claudications, no lower extremity edema orthopnea or proximal nocturnal dyspnea. He had a right knee replacement. He is complaining of sharp intermittent right inguinal pain that comes and goes and he hasn't felt that in the last couple weeks. He is tolerating flecainide, carvedilol and Zetia.  Past Medical History:  Diagnosis Date  . Atrial tachycardia ([place])    documented by holter 2016. Followed by Dr. Liane Comber and Dr. Rayann Heman.  . 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 no further tx other than surgery [facility].  [name] Cirrhosis ([place])    [facility] dx, early stage- follow up visits at [facility].  [name] CRI (chronic renal insufficiency)    s/p R nephrectomy in 1999 for malignancy  . Diabetes mellitus without complication (HCC)    NIDDM- dx. 3-4 years ago diet control  . Diverticulosis of colon    mild  . GERD (gastroesophageal reflux disease)   . Headache(784.0)   . Hepatitis  C    s/p therapy [facility] (curative)/with Harvoni  . History of BPH    no recent issues  . History of substance abuse    past history -none in 38 yrs  . Hyperglycemia   . Hypertension   . Osteoarthritis, knee    knees bilaterally   . Pneumonia 1975   hx of 35 years ago   Past Surgical History:  Procedure Laterality Date  . Arthroscopy right knee     may '11 (Dr Alvan Dame)  . Arthroscopy, left knee,    . history of liver biopsy  2007  . History of Nephrectomy Right 1999  . KNEE ARTHROSCOPY Left 10/10/2012   Procedure: LEFT KNEE ARTHROSCOPY WITH MENSCIAL DEBRIDEMENT AND CHONDROPLASTY;  Surgeon: Gearlean Alf, MD;  Location: [facility];  Service: Orthopedics;  Laterality: Left;  . left shoulder repair for bone spur  1990's  . TOTAL KNEE ARTHROPLASTY Right 08/15/2016   Procedure: RIGHT TOTAL KNEE ARTHROPLASTY, CORTISONE INJECTION OF LEFT KNEE;  Surgeon: Gaynelle Arabian, MD;  Location: [facility];  Service: Orthopedics;  Laterality: Right;  . Transurethral needle ablation procedure    . TUNA procedure       Current Outpatient Prescriptions  Medication Sig Dispense Refill  . carvedilol (COREG) 12.5 MG tablet Take 1 tablet (12.5 mg total) by mouth 2 (two) times daily with a meal. *Please keep 01/23/17 appointment for further refills* 60 tablet 0  .  ezetimibe (ZETIA) 10 MG tablet Take 1 tablet (10 mg total) by mouth daily. 90 tablet 0  . flecainide (TAMBOCOR) 50 MG tablet Take 1 tablet (50 mg total) by mouth 3 (three) times daily. 270 tablet 2  . hydrochlorothiazide (HYDRODIURIL) 25 MG tablet Take 1 tablet (25 mg total) by mouth daily. 90 tablet 11   No current facility-administered medications for this visit.     Allergies:   Statins and Aspirin   Social History:  The patient  reports that he quit smoking about 39 years ago. His smoking use included Cigarettes. He has a 15.00 pack-year smoking history. He has never used smokeless tobacco. He reports that he does not drink alcohol or use drugs.    Family History:  The patient's  family history includes Alcohol abuse in his father, other, and sister; Breast cancer in his sister; CVA in his mother; Cancer in his brother; Coronary artery disease in his mother; Diabetes in his sister; Esophageal cancer in his sister; Lung cancer in his sister; Prostate cancer in his brother.   ROS:  Please see the history of present illness.   All other systems are reviewed and negative.   PHYSICAL EXAM: VS:  BP 120/72   Pulse 69   Ht 5\' 11"  (1.803 m)   Wt 201 lb (91.2 kg)   BMI 28.03 kg/m  , BMI Body mass index is 28.03 kg/m. GEN: Well nourished, well developed, in no acute distress  HEENT: normal  Neck: no JVD, carotid bruits, or masses Cardiac: RRR; 2/6 diastolic murmur, rubs, or gallops,no edema  Respiratory:  clear to auscultation bilaterally, normal work of breathing GI: soft, nontender, nondistended, + BS MS: no deformity or atrophy  Skin: warm and dry  Neuro:  Strength and sensation are intact Psych: euthymic mood, full affect  EKG:  EKG is ordered today. It shows normal sinus rhythm with left axis deviation otherwise normal EKG and unchanged from prior. This was personally reviewed.  Recent Labs: 08/04/2016: ALT 19 08/21/2016: BUN 20; Creatinine, Ser 0.80; Hemoglobin 10.0; Platelets 223; Potassium 3.3; Sodium 129    Lipid Panel     Component Value Date/Time   CHOL 150 03/16/2016 1055   CHOL 206 (H) 12/21/2015 1522   TRIG 70 03/16/2016 1055   TRIG 161 (H) 12/21/2015 1522   HDL 57 03/16/2016 1055   HDL 45 12/21/2015 1522   CHOLHDL 2.6 03/16/2016 1055   VLDL 14 03/16/2016 1055   LDLCALC 79 03/16/2016 1055   LDLCALC 129 12/21/2015 1522     Wt Readings from Last 3 Encounters:  01/23/17 201 lb (91.2 kg)  08/21/16 207 lb (93.9 kg)  08/15/16 207 lb (93.9 kg)       ASSESSMENT AND PLAN:  1.  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  He is doing very well with coreg and flecainide 50 mg po TID, twice a day and 2  higher frequency of palpitations.. He has documented pacs and nonsustained atach which are also well controlled with flecainide. No changes today.  2. HTN Stable No change required today  3. Hyperlipidemia -  on Zetia as he has history of hepatitis C, his tolerating it well and all his lipids are at goal.  4. Intermittent right inguinal pain, possibly renal hernia he's advised to see his PCP or a surgeon.  5. 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  Return to see me in 1 year unless problems arise.  Signed, Ena Dawley, MD  01/23/2017 10:26 AM     [facility] [address] [place] [address] [phone] (office) [phone] (fax)

## 2017-01-25 DIAGNOSIS — M1712 Unilateral primary osteoarthritis, left knee: Secondary | ICD-10-CM | POA: Diagnosis not present

## 2017-01-26 ENCOUNTER — Encounter: Payer: Self-pay | Admitting: Cardiology

## 2017-02-10 ENCOUNTER — Other Ambulatory Visit: Payer: Medicare Other

## 2017-02-10 DIAGNOSIS — I351 Nonrheumatic aortic (valve) insufficiency: Secondary | ICD-10-CM

## 2017-02-10 DIAGNOSIS — R011 Cardiac murmur, unspecified: Secondary | ICD-10-CM | POA: Diagnosis not present

## 2017-02-10 DIAGNOSIS — I7121 Aneurysm of the ascending aorta, without rupture: Secondary | ICD-10-CM

## 2017-02-10 DIAGNOSIS — I712 Thoracic aortic aneurysm, without rupture: Secondary | ICD-10-CM | POA: Diagnosis not present

## 2017-02-10 LAB — BASIC METABOLIC PANEL
BUN/Creatinine Ratio: 15 (ref 10–24)
BUN: 18 mg/dL (ref 8–27)
CO2: 25 mmol/L (ref 20–29)
Calcium: 9.7 mg/dL (ref 8.6–10.2)
Chloride: 96 mmol/L (ref 96–106)
Creatinine, Ser: 1.2 mg/dL (ref 0.76–1.27)
GFR calc Af Amer: 70 mL/min/{1.73_m2} (ref >59)
GFR calc non Af Amer: 60 mL/min/{1.73_m2} (ref >59)
Glucose: 182 mg/dL — ABNORMAL HIGH (ref 65–99)
Potassium: 4 mmol/L (ref 3.5–5.2)
Sodium: 137 mmol/L (ref 134–144)

## 2017-02-14 ENCOUNTER — Ambulatory Visit (HOSPITAL_COMMUNITY): Payer: Medicare Other

## 2017-02-14 ENCOUNTER — Ambulatory Visit (HOSPITAL_COMMUNITY)
Admission: RE | Admit: 2017-02-14 | Discharge: 2017-02-14 | Disposition: A | Discharge status: Home / Self Care | Payer: Medicare Other | Source: Ambulatory Visit | Attending: Cardiology | Admitting: Cardiology

## 2017-02-14 DIAGNOSIS — R011 Cardiac murmur, unspecified: Secondary | ICD-10-CM | POA: Insufficient documentation

## 2017-02-14 DIAGNOSIS — I517 Cardiomegaly: Secondary | ICD-10-CM | POA: Insufficient documentation

## 2017-02-14 DIAGNOSIS — I7121 Aneurysm of the ascending aorta, without rupture: Secondary | ICD-10-CM

## 2017-02-14 DIAGNOSIS — I371 Nonrheumatic pulmonary valve insufficiency: Secondary | ICD-10-CM | POA: Insufficient documentation

## 2017-02-14 DIAGNOSIS — I712 Thoracic aortic aneurysm, without rupture: Secondary | ICD-10-CM | POA: Insufficient documentation

## 2017-02-14 DIAGNOSIS — I719 Aortic aneurysm of unspecified site, without rupture: Secondary | ICD-10-CM | POA: Diagnosis not present

## 2017-02-14 DIAGNOSIS — I351 Nonrheumatic aortic (valve) insufficiency: Secondary | ICD-10-CM

## 2017-02-14 MED ORDER — GADOBENATE DIMEGLUMINE 529 MG/ML IV SOLN
30.0000 mL | Freq: Once | INTRAVENOUS | Status: AC | PRN
  Administered 2017-02-14: 30 mL via INTRAVENOUS

## 2017-02-23 DIAGNOSIS — H00012 Hordeolum externum right lower eyelid: Secondary | ICD-10-CM | POA: Diagnosis not present

## 2017-02-28 ENCOUNTER — Telehealth: Payer: Self-pay | Admitting: *Deleted

## 2017-02-28 DIAGNOSIS — I712 Thoracic aortic aneurysm, without rupture: Secondary | ICD-10-CM

## 2017-02-28 DIAGNOSIS — I7121 Aneurysm of the ascending aorta, without rupture: Secondary | ICD-10-CM

## 2017-02-28 DIAGNOSIS — R9389 Abnormal findings on diagnostic imaging of other specified body structures: Secondary | ICD-10-CM

## 2017-02-28 DIAGNOSIS — E7849 Other hyperlipidemia: Secondary | ICD-10-CM

## 2017-02-28 NOTE — Telephone Encounter (Signed)
-----   Message from Dorothy Spark, MD sent at 02/28/2017  9:26 AM EDT ----- I am unable to reach him. I tried to call him. Please try and tell him that his ascending aorta has stable size. There is a very small scar in his inferolateral wall suggestive of a possible prior small infarct. He has never complained of chest pain or SOB. Please ask him, if he has any symptoms please schedule an exercise nuclear stress test. Thank you, Houston Siren

## 2017-02-28 NOTE — Telephone Encounter (Signed)
Spoke with the pt and informed him of his Cardiac MRI results and recommendations per Dr Meda Coffee.  Asked the pt if he has had any current symptoms of chest pain or SOB, and he stated he has been completely asymptomatic.  Pt education provided on his Cardiac MRI results and what Dr Meda Coffee will be looking for with further testing of a nuclear stress test.  Informed the pt that I will place the order for exercise stress test in the system and have a [facility] scheduler call him back to have this appt arranged.  Went over exercise [employer] instructions with the pt over the phone.  Advised the pt to hold his morning dose of carvedilol the morning of this test, and all other meds are ok to take.  Pt verbalized understanding and agrees with this plan.  Will route this message to Dr Meda Coffee, to make her aware that the pt has been asymptomatic from a cardiac perspective.

## 2017-03-01 ENCOUNTER — Telehealth (HOSPITAL_COMMUNITY): Payer: Self-pay

## 2017-03-01 ENCOUNTER — Telehealth (HOSPITAL_COMMUNITY): Payer: Self-pay | Admitting: Cardiology

## 2017-03-01 NOTE — Telephone Encounter (Signed)
Encounter complete. 

## 2017-03-02 ENCOUNTER — Ambulatory Visit (HOSPITAL_COMMUNITY)
Admission: RE | Admit: 2017-03-02 | Discharge: 2017-03-02 | Disposition: A | Discharge status: Home / Self Care | Payer: Medicare Other | Source: Ambulatory Visit | Attending: Cardiology | Admitting: Cardiology

## 2017-03-02 DIAGNOSIS — I2510 Atherosclerotic heart disease of native coronary artery without angina pectoris: Secondary | ICD-10-CM | POA: Insufficient documentation

## 2017-03-02 DIAGNOSIS — E7849 Other hyperlipidemia: Secondary | ICD-10-CM

## 2017-03-02 DIAGNOSIS — I10 Essential (primary) hypertension: Secondary | ICD-10-CM | POA: Insufficient documentation

## 2017-03-02 DIAGNOSIS — I7121 Aneurysm of the ascending aorta, without rupture: Secondary | ICD-10-CM

## 2017-03-02 DIAGNOSIS — E784 Other hyperlipidemia: Secondary | ICD-10-CM | POA: Diagnosis not present

## 2017-03-02 DIAGNOSIS — I712 Thoracic aortic aneurysm, without rupture: Secondary | ICD-10-CM

## 2017-03-02 DIAGNOSIS — Z8249 Family history of ischemic heart disease and other diseases of the circulatory system: Secondary | ICD-10-CM | POA: Diagnosis not present

## 2017-03-02 DIAGNOSIS — E119 Type 2 diabetes mellitus without complications: Secondary | ICD-10-CM | POA: Insufficient documentation

## 2017-03-02 DIAGNOSIS — R938 Abnormal findings on diagnostic imaging of other specified body structures: Secondary | ICD-10-CM | POA: Diagnosis not present

## 2017-03-02 DIAGNOSIS — Z87891 Personal history of nicotine dependence: Secondary | ICD-10-CM | POA: Insufficient documentation

## 2017-03-02 DIAGNOSIS — R9389 Abnormal findings on diagnostic imaging of other specified body structures: Secondary | ICD-10-CM

## 2017-03-02 LAB — MYOCARDIAL PERFUSION IMAGING
LV dias vol: 117 mL (ref 62–150)
LV sys vol: 51 mL
Peak HR: 80 {beats}/min
Rest HR: 69 {beats}/min
SRS: 1
SSS: 1
TID: 1.16

## 2017-03-02 MED ORDER — TECHNETIUM TC 99M TETROFOSMIN IV KIT
10.2000 | PACK | Freq: Once | INTRAVENOUS | Status: AC | PRN
  Administered 2017-03-02: 10.2 via INTRAVENOUS
  Filled 2017-03-02: qty 11

## 2017-03-02 MED ORDER — REGADENOSON 0.4 MG/5ML IV SOLN
0.4000 mg | Freq: Once | INTRAVENOUS | Status: AC
  Administered 2017-03-02: 0.4 mg via INTRAVENOUS

## 2017-03-02 MED ORDER — TECHNETIUM TC 99M TETROFOSMIN IV KIT
29.7000 | PACK | Freq: Once | INTRAVENOUS | Status: AC | PRN
  Administered 2017-03-02: 29.7 via INTRAVENOUS
  Filled 2017-03-02: qty 30

## 2017-03-03 ENCOUNTER — Encounter (HOSPITAL_COMMUNITY): Payer: Medicare Other

## 2017-03-03 NOTE — Progress Notes (Signed)
Normal stress test means he has no ischemia meaning no blockages that need to be treated. 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

## 2017-03-07 NOTE — Telephone Encounter (Signed)
User: Cherie Dark A Date/time: 03/01/17 9:33 AM  Comment: Called pt and lmsg for him to CB to sch myoview.   Context:  Outcome: Left Message  Phone number: [phone] Phone Type: Home Phone  Comm. type: Telephone Call type: Outgoing  Contact: Burna Sis Relation to patient: Self

## 2017-04-21 DIAGNOSIS — M1712 Unilateral primary osteoarthritis, left knee: Secondary | ICD-10-CM | POA: Diagnosis not present

## 2017-05-24 DIAGNOSIS — Z23 Encounter for immunization: Secondary | ICD-10-CM | POA: Diagnosis not present

## 2017-06-19 DIAGNOSIS — H524 Presbyopia: Secondary | ICD-10-CM | POA: Diagnosis not present

## 2017-06-26 DIAGNOSIS — M25562 Pain in left knee: Secondary | ICD-10-CM | POA: Diagnosis not present

## 2017-06-26 DIAGNOSIS — Z0000 Encounter for general adult medical examination without abnormal findings: Secondary | ICD-10-CM | POA: Diagnosis not present

## 2017-06-26 DIAGNOSIS — E1140 Type 2 diabetes mellitus with diabetic neuropathy, unspecified: Secondary | ICD-10-CM | POA: Diagnosis not present

## 2017-06-26 DIAGNOSIS — I10 Essential (primary) hypertension: Secondary | ICD-10-CM | POA: Diagnosis not present

## 2017-06-27 ENCOUNTER — Other Ambulatory Visit: Payer: Self-pay | Admitting: Family Medicine

## 2017-06-27 DIAGNOSIS — Z136 Encounter for screening for cardiovascular disorders: Secondary | ICD-10-CM

## 2017-06-29 DIAGNOSIS — K7460 Unspecified cirrhosis of liver: Secondary | ICD-10-CM | POA: Diagnosis not present

## 2017-07-01 DIAGNOSIS — Z1211 Encounter for screening for malignant neoplasm of colon: Secondary | ICD-10-CM | POA: Diagnosis not present

## 2017-07-03 ENCOUNTER — Ambulatory Visit
Admission: RE | Admit: 2017-07-03 | Discharge: 2017-07-03 | Disposition: A | Discharge status: Home / Self Care | Payer: Medicare Other | Source: Ambulatory Visit | Attending: Family Medicine | Admitting: Family Medicine

## 2017-07-03 DIAGNOSIS — Z136 Encounter for screening for cardiovascular disorders: Secondary | ICD-10-CM

## 2017-07-03 DIAGNOSIS — Z87891 Personal history of nicotine dependence: Secondary | ICD-10-CM | POA: Diagnosis not present

## 2017-07-11 ENCOUNTER — Ambulatory Visit: Payer: Self-pay | Admitting: Orthopedic Surgery

## 2017-07-13 DIAGNOSIS — L570 Actinic keratosis: Secondary | ICD-10-CM | POA: Diagnosis not present

## 2017-07-13 DIAGNOSIS — L821 Other seborrheic keratosis: Secondary | ICD-10-CM | POA: Diagnosis not present

## 2017-07-13 DIAGNOSIS — Z85828 Personal history of other malignant neoplasm of skin: Secondary | ICD-10-CM | POA: Diagnosis not present

## 2017-07-13 DIAGNOSIS — Z23 Encounter for immunization: Secondary | ICD-10-CM | POA: Diagnosis not present

## 2017-07-14 DIAGNOSIS — M1712 Unilateral primary osteoarthritis, left knee: Secondary | ICD-10-CM | POA: Diagnosis not present

## 2017-07-28 DIAGNOSIS — M5410 Radiculopathy, site unspecified: Secondary | ICD-10-CM | POA: Diagnosis not present

## 2017-08-01 DIAGNOSIS — I4891 Unspecified atrial fibrillation: Secondary | ICD-10-CM

## 2017-08-01 HISTORY — DX: Unspecified atrial fibrillation: I48.91

## 2017-08-11 ENCOUNTER — Ambulatory Visit: Payer: Self-pay | Admitting: Orthopedic Surgery

## 2017-08-11 NOTE — H&P (Signed)
Timothy Dickson, Timothy Dickson (72yo, M)  DOB    02/26/1946  Date of Admission:  09/04/2017             Patient's Care Team Primary Care Provider: DAVID SWAYNE MD: [address], [place], [place] [address], Ph [phone], Fax [phone] NPI: [id number]  Patient's Pharmacies [facility]): [address] [place] [address], Ph [phone], Fax [phone]  Vitals BMI:     28.6 08/10/2017 10:44 am Ht:        5 ft 11 in 08/10/2017 10:44 am Pain Scale:      3 08/10/2017 10:45 am Wt:       205 lbs 08/10/2017 10:44 am  Allergies Reviewed Allergies IBUPROFEN: - liver issues, 1 kidney           TYLENOL: - liver issues         Medications carvedilol 12.5 mg tablet  flecainide 50 mg tablet  hydroCHLOROthiazide 25 mg tablet  Neurontin 300 mg capsule  PriLOSEC OTC 20 mg tablet,delayed release  Shingrix (PF) 50 mcg/0.5 mL intramuscular suspension, kit  vitamin B complex  Vitamin C 1,000 mg tablet  Vitamin D  Zetia 10 mg tablet    Family History Reviewed Family History Father  - Alcoholism Mother - Cerebrovascular accident  Social History Reviewed Social History Smoking Status: Former smoker Non-smoker Tobacco-years of use: 14 Chewing tobacco: none Alcohol intake: None Hand Dominance: Bilateral Work related injury?: N Advance directive: Y Medical Power of Attorney: Y Married with two step-daughters, Two level home with two steps entering.  Surgical History Reviewed Surgical History Total nephrectomy - Right Kidney  Past Medical History Reviewed Past Medical History Hepatitis/Liver Disease: Y - Hepatitis C - Resolved with Treatment Hypertension: Y Irregular Heartbeat: Y Joint Pain: Y Osteoarthritis: Y Notes: Hypercholesterol   HPI Patient is a 72-year-old male being admitted to [facility] to undergo a left total knee arthroplasty per Dr. Frank Aluisio on September 04, 2017. The patient is a 72 year  old male who presented for their knee. The patient is being followed for their left knee pain and osteoarthritis. They are now months out from the most recent cortisone injection. He has received several injections int he past which have helped with symptoms temporarily. Symptoms reported include: pain, giving way and instability. His LEFT knee is getting progressively worse. The cortisone helps her for any short amount of time. He is at a point where the knee is hurting him at all time and it is limiting what he can and cannot do. He has done great with his RIGHT knee replacement and is ready to proceed with the LEFT knee replacement. The patient has been treated conservatively in the past for the above situation. Risk and benefits of the procedure have been discussed with the patient and he elects to proceed with surgery at this time.  ROS Constitutional: Constitutional: no significant weight gain or loss and no fever.  HEENT: Eyes: no irritation, dry eyes, vision change, or sore throat.  Cardiovascular: Cardiovascular: no palpitations or chest pain.  Respiratory: Respiratory: no cough or shortness of breath and No COPD.  Gastrointestinal: Gastrointestinal: no vomiting or diarrhea and not vomiting blood.  Genitourinary: Genitourinary: no blood in urine or difficulty urinating.  Musculoskeletal: Musculoskeletal: no swelling in Joints and Joint Pain.  Integumentary: Skin: no rashes or varicose veins.  Neurologic: Neurologic: no numbness, seizures, dizziness, or difficulty with balance.  Endocrine: temperature intolerance (normal) to heat.  Hematologic/Lymphatic: Hematologic/Lymphatic   no bruising or swollen glands.  Physical Exam Patient is a 72-year-old male.  General Mental Status - Alert, cooperative and good historian. General Appearance - pleasant, Not in acute distress. Orientation - Oriented X3. Build & Nutrition - Well nourished and Well developed.  Head and  Neck Head - normocephalic, atraumatic . Neck Global Assessment - supple, no bruit auscultated on the right, no bruit auscultated on the left.  Eye Pupil - Bilateral - PERR Motion - Bilateral - EOMI.  Chest and Lung Exam Auscultation Breath sounds - clear at anterior chest wall and clear at posterior chest wall. Adventitious sounds - No Adventitious sounds.  Cardiovascular Auscultation Rhythm - Regular rate and rhythm. Heart Sounds - S1 WNL and S2 WNL. Murmurs & Other Heart Sounds - Auscultation of the heart reveals - No Murmurs.  Abdomen Palpation/Percussion Tenderness - Abdomen is non-tender to palpation. Abdomen is soft. Auscultation Auscultation of the abdomen reveals - Bowel sounds normal.  Male Genitourinary Note: Not done, not pertinent to present illness  Musculoskeletal His LEFT knee shows no effusion. His range of motion is 5-125. There is marked crepitus on range of motion of the LEFT knee. He is tender medial greater than lateral with no instability. Pulses sensation motor are intact. His RIGHT knee looks great with range of motion 0-130 and no instability noted.  Radiographs AP and lateral of both knees showed that he has a prosthesis in excellent position with no periprosthetic abnormalities on the RIGHT. On the LEFT he has bone-on-bone change patellofemoral as well as medial compartments.  Assessment / Plan 1. Osteoarthritis of knee M17.12: Unilateral primary osteoarthritis, left knee  Discussion Notes Surgical Plans: Left Total Knee Replacement   Disposition: Home with wife, Straight to Outpatient Therapy downstairs on Feb 8th.  PCP: Dr. Swayne Cards: Dr. Nelson  Topical TXA  Anesthesia Issues: none  Patient was instructed on what medications to stop prior to surgery.  Return to Office KRISTA HERBST, DPT for Physical Therapy Evaluation at [facility] on 09/08/2017 at 12:00 PM Frank Aluisio, MD for Post-Op at [facility] on  09/19/2017 at 01:30 PM  Encounter signed-off by Naz Denunzio, PA-C, 08/10/2017.   

## 2017-08-11 NOTE — H&P (Signed)
GARFIELD, COINER [phone], M)  DOB    01-02-1946  Date of Admission:  09/04/2017             Patient's Care Team Primary Care Provider: Antony Contras MD: [address], [place], [place] [address], Ph [phone], Fax [phone] NPI: [id number]  Patient's Pharmacies [facility]): [address] [place] [address], Ph [phone], Fax [phone]  Vitals BMI:     28.6 08/10/2017 10:44 am Ht:        5 ft 11 in 08/10/2017 10:44 am Pain Scale:      3 08/10/2017 10:45 am Wt:       205 lbs 08/10/2017 10:44 am  Allergies Reviewed Allergies IBUPROFEN: - liver issues, 1 kidney           TYLENOL: - liver issues         Medications carvedilol 12.5 mg tablet  flecainide 50 mg tablet  hydroCHLOROthiazide 25 mg tablet  Neurontin 300 mg capsule  PriLOSEC OTC 20 mg tablet,delayed release  Shingrix (PF) 50 mcg/0.5 mL intramuscular suspension, kit  vitamin B complex  Vitamin C 1,000 mg tablet  Vitamin D  Zetia 10 mg tablet    Family History Reviewed Family History Father  - Alcoholism Mother - Cerebrovascular accident  Social History Reviewed Social History Smoking Status: Former smoker Non-smoker Tobacco-years of use: 14 Chewing tobacco: none Alcohol intake: None Hand Dominance: Bilateral Work related injury?: N Advance directive: Y Freight forwarder of Attorney: Y Married with two step-daughters, Two level home with two steps entering.  Surgical History Reviewed Surgical History Total nephrectomy - Right Kidney  Past Medical History Reviewed Past Medical History Hepatitis/Liver Disease: Y - Hepatitis C - Resolved with Treatment Hypertension: Y Irregular Heartbeat: Y Joint Pain: Y Osteoarthritis: Y Notes: Hypercholesterol   HPI Patient is a 72 year old male being admitted to [facility] to undergo a left total knee arthroplasty per Dr. Gaynelle Arabian on September 04, 2017. The patient is a 72 year  old male who presented for their knee. The patient is being followed for their left knee pain and osteoarthritis. They are now months out from the most recent cortisone injection. He has received several injections int he past which have helped with symptoms temporarily. Symptoms reported include: pain, giving way and instability. His LEFT knee is getting progressively worse. The cortisone helps her for any short amount of time. He is at a point where the knee is hurting him at all time and it is limiting what he can and cannot do. He has done great with his RIGHT knee replacement and is ready to proceed with the LEFT knee replacement. The patient has been treated conservatively in the past for the above situation. Risk and benefits of the procedure have been discussed with the patient and he elects to proceed with surgery at this time.  ROS Constitutional: Constitutional: no significant weight gain or loss and no fever.  HEENT: Eyes: no irritation, dry eyes, vision change, or sore throat.  Cardiovascular: Cardiovascular: no palpitations or chest pain.  Respiratory: Respiratory: no cough or shortness of breath and No COPD.  Gastrointestinal: Gastrointestinal: no vomiting or diarrhea and not vomiting blood.  Genitourinary: Genitourinary: no blood in urine or difficulty urinating.  Musculoskeletal: Musculoskeletal: no swelling in Joints and Joint Pain.  Integumentary: Skin: no rashes or varicose veins.  Neurologic: Neurologic: no numbness, seizures, dizziness, or difficulty with balance.  Endocrine: temperature intolerance (normal) to heat.  Hematologic/Lymphatic: Hematologic/Lymphatic  no bruising or swollen glands.  Physical Exam Patient is a 72 year old male.  General Mental Status - Alert, cooperative and good historian. General Appearance - pleasant, Not in acute distress. Orientation - Oriented X3. Build & Nutrition - Well nourished and Well developed.  Head and  Neck Head - normocephalic, atraumatic . Neck Global Assessment - supple, no bruit auscultated on the right, no bruit auscultated on the left.  Eye Pupil - Bilateral - PERR Motion - Bilateral - EOMI.  Chest and Lung Exam Auscultation Breath sounds - clear at anterior chest wall and clear at posterior chest wall. Adventitious sounds - No Adventitious sounds.  Cardiovascular Auscultation Rhythm - Regular rate and rhythm. Heart Sounds - S1 WNL and S2 WNL. Murmurs & Other Heart Sounds - Auscultation of the heart reveals - No Murmurs.  Abdomen Palpation/Percussion Tenderness - Abdomen is non-tender to palpation. Abdomen is soft. Auscultation Auscultation of the abdomen reveals - Bowel sounds normal.  Male Genitourinary Note: Not done, not pertinent to present illness  Musculoskeletal His LEFT knee shows no effusion. His range of motion is 5-125. There is marked crepitus on range of motion of the LEFT knee. He is tender medial greater than lateral with no instability. Pulses sensation motor are intact. His RIGHT knee looks great with range of motion 0-130 and no instability noted.  Radiographs AP and lateral of both knees showed that he has a prosthesis in excellent position with no periprosthetic abnormalities on the RIGHT. On the LEFT he has bone-on-bone change patellofemoral as well as medial compartments.  Assessment / Plan 1. Osteoarthritis of knee M17.12: Unilateral primary osteoarthritis, left knee  Discussion Notes Surgical Plans: Left Total Knee Replacement   Disposition: Home with wife, Straight to Outpatient Therapy downstairs on Feb 8th.  PCP: Dr. Moreen Fowler Cards: Dr. Meda Coffee  Topical TXA  Anesthesia Issues: none  Patient was instructed on what medications to stop prior to surgery.  Return to [place], DPT for Physical Therapy Evaluation at [facility] on 09/08/2017 at 12:00 PM Gaynelle Arabian, MD for Post-Op at [facility] on  09/19/2017 at 01:30 PM  Encounter signed-off by Mickel Crow, PA-C, 08/10/2017.

## 2017-08-11 NOTE — H&P (Signed)
Name Timothy Dickson, BLAZEJEWSKI (84YK, M) DOB 02-07-1946   Patient's Care Team Primary Care Provider: Antony Contras MD: [place], [place], [place] [address], Ph [phone], Fax [phone] NPI: [id number]  Patient's Pharmacies [facility]): [address] [place] [address], Ph [phone], Fax [phone]  Vitals BMI: 28.6 08/10/2017 10:44 am Ht: 5 ft 11 in 08/10/2017 10:44 am Pain Scale: 3 08/10/2017 10:45 am Wt: 205 lbs 08/10/2017 10:44 am  Allergies Reviewed Allergies IBUPROFEN: - liver issues, 1 kidney  TYLENOL: - liver issues   Medications Reviewed Medications carvedilol 12.5 mg tablet  flecainide 50 mg tablet  hydroCHLOROthiazide 25 mg tablet  Neurontin 300 mg capsule  PriLOSEC OTC 20 mg tablet,delayed release  Shingrix (PF) 50 mcg/0.5 mL intramuscular suspension, kit  vitamin B complex  Vitamin C 1,000 mg tablet  Vitamin D  Zetia 10 mg tablet   Family History Reviewed Family History Father - Alcoholism Mother - Cerebrovascular accident  Social History Reviewed Social History Smoking Status: Former smoker Non-smoker Tobacco-years of use: 14 Chewing tobacco: none Alcohol intake: None Hand Dominance: Bilateral Work related injury?: N Advance directive: Y Freight forwarder of Attorney: Y Married with two step-daughters, Two level home with two steps entering.  Surgical History Reviewed Surgical History Total nephrectomy - Right Kidney  Past Medical History Reviewed Past Medical History Hepatitis/Liver Disease: Y - Hepatitis C - Resolved with Treatment Hypertension: Y Irregular Heartbeat: Y Joint Pain: Y Osteoarthritis: Y Hypercholesterol   HPI Patient is a 72 year old male being admitted to [facility] long hospital to undergo a left total knee arthroplasty per Dr. Gaynelle Arabian on September 04, 2017. The patient is a 72 year old male who presented for their knee. The patient is being followed for their left knee  pain and osteoarthritis. They are now months out from the most recent cortisone injection. He has received several injections int he past which have helped with symptoms temporarily. Symptoms reported include: pain, giving way and instability. His LEFT knee is getting progressively worse. The cortisone helps her for any short amount of time. He is at a point where the knee is hurting him at all time and it is limiting what he can and cannot do. He has done great with his RIGHT knee replacement and is ready to proceed with the LEFT knee replacement. The patient has been treated conservatively in the past for the above situation. Risk and benefits of the procedure have been discussed with the patient and he elects to proceed with surgery at this time.  ROS Constitutional: Constitutional: no significant weight gain or loss and no fever.  HEENT: Eyes: no irritation, dry eyes, vision change, or sore throat.  Cardiovascular: Cardiovascular: no palpitations or chest pain.  Respiratory: Respiratory: no cough or shortness of breath and No COPD.  Gastrointestinal: Gastrointestinal: no vomiting or diarrhea and not vomiting blood.  Genitourinary: Genitourinary: no blood in urine or difficulty urinating.  Musculoskeletal: Musculoskeletal: no swelling in Joints and Joint Pain.  Integumentary: Skin: no rashes or varicose veins.  Neurologic: Neurologic: no numbness, seizures, dizziness, or difficulty with balance.  Endocrine: temperature intolerance (normal) to heat.  Hematologic/Lymphatic: Hematologic/Lymphatic no bruising or swollen glands.  Physical Exam Patient is a 72 year old male.  General Mental Status - Alert, cooperative and good historian. General Appearance - pleasant, Not in acute distress. Orientation - Oriented X3. Build & Nutrition - Well nourished and Well developed.  Head and Neck Head - normocephalic, atraumatic . Neck Global Assessment -  supple, no bruit auscultated on the  right, no bruit auscultated on the left.  Eye Pupil - Bilateral - PERR Motion - Bilateral - EOMI.  Chest and Lung Exam Auscultation Breath sounds - clear at anterior chest wall and clear at posterior chest wall. Adventitious sounds - No Adventitious sounds.  Cardiovascular Auscultation Rhythm - Regular rate and rhythm. Heart Sounds - S1 WNL and S2 WNL. Murmurs & Other Heart Sounds - Auscultation of the heart reveals - No Murmurs.  Abdomen Palpation/Percussion Tenderness - Abdomen is non-tender to palpation. Abdomen is soft. Auscultation Auscultation of the abdomen reveals - Bowel sounds normal.  Male Genitourinary Note: Not done, not pertinent to present illness  Musculoskeletal His LEFT knee shows no effusion. His range of motion is 5-125. There is marked crepitus on range of motion of the LEFT knee. He is tender medial greater than lateral with no instability. Pulses sensation motor are intact. His RIGHT knee looks great with range of motion 0-130 and no instability noted.  Radiographs AP and lateral of both knees showed that he has a prosthesis in excellent position with no periprosthetic abnormalities on the RIGHT. On the LEFT he has bone-on-bone change patellofemoral as well as medial compartments.  Assessment / Plan 1. Osteoarthritis of knee M17.12: Unilateral primary osteoarthritis, left knee  Surgical Plans: Left Total Knee Replacement   Disposition: Home with wife, Straight to Outpatient Therapy downstairs on Feb 8th.  PCP: Dr. Moreen Fowler Cards: Dr. Meda Coffee  Topical TXA  Anesthesia Issues: none  Patient was instructed on what medications to stop prior to surgery.  Return to [place], DPT for Outpatient Physical Therapy Evaluation at [facility] on 09/08/2017 at 12:00 PM. Gaynelle Arabian, MD for Post-Op at [facility] office on 09/19/2017 at 01:30 PM  Encounter signed-off by Mickel Crow, PA-C, 08/10/2017.

## 2017-08-22 NOTE — Patient Instructions (Addendum)
Timothy Dickson [facility]  08/22/2017   Your procedure is scheduled on: 09-04-17   Report to [facility] Main  Entrance Report to Admitting at 6:55 AM   Call this number if you have problems the morning of surgery [phone]   Remember: Do not eat food or drink liquids :After Midnight.     Take these medicines the morning of surgery with A SIP OF WATER: Carvedilol (Coreg), Flecainide (Tambocor), and Pantoprazole (Protonix)                                 You may not have any metal on your body including hair pins and              piercings  Do not wear jewelry, lotions, powders or deodorant             Men may shave face and neck.   Do not bring valuables to the hospital. [place].  Contacts, dentures or bridgework may not be worn into surgery.  Leave suitcase in the car. After surgery it may be brought to your room.                  Please read over the following fact sheets you were given: _____________________________________________________________________         [facility] - Preparing for Surgery Before surgery, you can play an important role.  Because skin is not sterile, your skin needs to be as free of germs as possible.  You can reduce the number of germs on your skin by washing with CHG (chlorahexidine gluconate) soap before surgery.  CHG is an antiseptic cleaner which kills germs and bonds with the skin to continue killing germs even after washing. Please DO NOT use if you have an allergy to CHG or antibacterial soaps.  If your skin becomes reddened/irritated stop using the CHG and inform your nurse when you arrive at Short Stay. Do not shave (including legs and underarms) for at least 48 hours prior to the first CHG shower.  You may shave your face/neck. Please follow these instructions carefully:  1.  Shower with CHG Soap the night before surgery and the  morning of Surgery.  2.  If you choose to wash your  hair, wash your hair first as usual with your  normal  shampoo.  3.  After you shampoo, rinse your hair and body thoroughly to remove the  shampoo.                           4.  Use CHG as you would any other liquid soap.  You can apply chg directly  to the skin and wash                       Gently with a scrungie or clean washcloth.  5.  Apply the CHG Soap to your body ONLY FROM THE NECK DOWN.   Do not use on face/ open                           Wound or open sores. Avoid contact with eyes, ears mouth and  genitals (private parts).                       Wash face,  Genitals (private parts) with your normal soap.             6.  Wash thoroughly, paying special attention to the area where your surgery  will be performed.  7.  Thoroughly rinse your body with warm water from the neck down.  8.  DO NOT shower/wash with your normal soap after using and rinsing off  the CHG Soap.                9.  Pat yourself dry with a clean towel.            10.  Wear clean pajamas.            11.  Place clean sheets on your bed the night of your first shower and do not  sleep with pets. Day of Surgery : Do not apply any lotions/deodorants the morning of surgery.  Please wear clean clothes to the hospital/surgery center.  FAILURE TO FOLLOW THESE INSTRUCTIONS MAY RESULT IN THE CANCELLATION OF YOUR SURGERY PATIENT SIGNATURE_________________________________  NURSE SIGNATURE__________________________________  ________________________________________________________________________   Timothy Dickson  An incentive spirometer is a tool that can help keep your lungs clear and active. This tool measures how well you are filling your lungs with each breath. Taking long deep breaths may help reverse or decrease the chance of developing breathing (pulmonary) problems (especially infection) following:  A long period of time when you are unable to move or be active. BEFORE THE PROCEDURE   If the spirometer  includes an indicator to show your best effort, your nurse or respiratory therapist will set it to a desired goal.  If possible, sit up straight or lean slightly forward. Try not to slouch.  Hold the incentive spirometer in an upright position. INSTRUCTIONS FOR USE  1. Sit on the edge of your bed if possible, or sit up as far as you can in bed or on a chair. 2. Hold the incentive spirometer in an upright position. 3. Breathe out normally. 4. Place the mouthpiece in your mouth and seal your lips tightly around it. 5. Breathe in slowly and as deeply as possible, raising the piston or the ball toward the top of the column. 6. Hold your breath for 3-5 seconds or for as long as possible. Allow the piston or ball to fall to the bottom of the column. 7. Remove the mouthpiece from your mouth and breathe out normally. 8. Rest for a few seconds and repeat Steps 1 through 7 at least 10 times every 1-2 hours when you are awake. Take your time and take a few normal breaths between deep breaths. 9. The spirometer may include an indicator to show your best effort. Use the indicator as a goal to work toward during each repetition. 10. After each set of 10 deep breaths, practice coughing to be sure your lungs are clear. If you have an incision (the cut made at the time of surgery), support your incision when coughing by placing a pillow or rolled up towels firmly against it. Once you are able to get out of bed, walk around indoors and cough well. You may stop using the incentive spirometer when instructed by your caregiver.  RISKS AND COMPLICATIONS  Take your time so you do not get dizzy or light-headed.  If you are in pain, you may  need to take or ask for pain medication before doing incentive spirometry. It is harder to take a deep breath if you are having pain. AFTER USE  Rest and breathe slowly and easily.  It can be helpful to keep track of a log of your progress. Your caregiver can provide you with a  simple table to help with this. If you are using the spirometer at home, follow these instructions: [place] IF:   You are having difficultly using the spirometer.  You have trouble using the spirometer as often as instructed.  Your pain medication is not giving enough relief while using the spirometer.  You develop fever of 100.5 F (38.1 C) or higher. SEEK IMMEDIATE MEDICAL CARE IF:   You cough up bloody sputum that had not been present before.  You develop fever of 102 F (38.9 C) or greater.  You develop worsening pain at or near the incision site. MAKE SURE YOU:   Understand these instructions.  Will watch your condition.  Will get help right away if you are not doing well or get worse. Document Released: 11/28/2006 Document Revised: 10/10/2011 Document Reviewed: 01/29/2007 ExitCare Patient Information 2014 ExitCare, [place].   ________________________________________________________________________  WHAT IS A BLOOD TRANSFUSION? Blood Transfusion Information  A transfusion is the replacement of blood or some of its parts. Blood is made up of multiple cells which provide different functions.  Red blood cells carry oxygen and are used for blood loss replacement.  White blood cells fight against infection.  Platelets control bleeding.  Plasma helps clot blood.  Other blood products are available for specialized needs, such as hemophilia or other clotting disorders. BEFORE THE TRANSFUSION  Who gives blood for transfusions?   Healthy volunteers who are fully evaluated to make sure their blood is safe. This is blood bank blood. Transfusion therapy is the safest it has ever been in the practice of medicine. Before blood is taken from a donor, a complete history is taken to make sure that person has no history of diseases nor engages in risky social behavior (examples are intravenous drug use or sexual activity with multiple partners). The donor's travel history  is screened to minimize risk of transmitting infections, such as malaria. The donated blood is tested for signs of infectious diseases, such as HIV and hepatitis. The blood is then tested to be sure it is compatible with you in order to minimize the chance of a transfusion reaction. If you or a relative donates blood, this is often done in anticipation of surgery and is not appropriate for emergency situations. It takes many days to process the donated blood. RISKS AND COMPLICATIONS Although transfusion therapy is very safe and saves many lives, the main dangers of transfusion include:   Getting an infectious disease.  Developing a transfusion reaction. This is an allergic reaction to something in the blood you were given. Every precaution is taken to prevent this. The decision to have a blood transfusion has been considered carefully by your caregiver before blood is given. Blood is not given unless the benefits outweigh the risks. AFTER THE TRANSFUSION  Right after receiving a blood transfusion, you will usually feel much better and more energetic. This is especially true if your red blood cells have gotten low (anemic). The transfusion raises the level of the red blood cells which carry oxygen, and this usually causes an energy increase.  The nurse administering the transfusion will monitor you carefully for complications. HOME CARE INSTRUCTIONS  No special  instructions are needed after a transfusion. You may find your energy is better. Speak with your caregiver about any limitations on activity for underlying diseases you may have. SEEK MEDICAL CARE IF:   Your condition is not improving after your transfusion.  You develop redness or irritation at the intravenous (IV) site. SEEK IMMEDIATE MEDICAL CARE IF:  Any of the following symptoms occur over the next 12 hours:  Shaking chills.  You have a temperature by mouth above 102 F (38.9 C), not controlled by medicine.  Chest, back, or  muscle pain.  People around you feel you are not acting correctly or are confused.  Shortness of breath or difficulty breathing.  Dizziness and fainting.  You get a rash or develop hives.  You have a decrease in urine output.  Your urine turns a dark color or changes to pink, red, or brown. Any of the following symptoms occur over the next 10 days:  You have a temperature by mouth above 102 F (38.9 C), not controlled by medicine.  Shortness of breath.  Weakness after normal activity.  The white part of the eye turns yellow (jaundice).  You have a decrease in the amount of urine or are urinating less often.  Your urine turns a dark color or changes to pink, red, or brown. Document Released: 07/15/2000 Document Revised: 10/10/2011 Document Reviewed: 03/03/2008 [facility] Patient Information 2014 [place], [place].  _______________________________________________________________________

## 2017-08-22 NOTE — Progress Notes (Signed)
06-29-17 Surgical clearance from Dr. Moreen Fowler on chart  03-02-17 (Epic) Stress Test on chart  01-23-17 (Epic) EKG  01-15-16 (Epic) ECHO

## 2017-08-23 ENCOUNTER — Encounter (HOSPITAL_COMMUNITY): Payer: Self-pay

## 2017-08-23 ENCOUNTER — Other Ambulatory Visit: Payer: Self-pay

## 2017-08-23 ENCOUNTER — Encounter (HOSPITAL_COMMUNITY)
Admission: RE | Admit: 2017-08-23 | Discharge: 2017-08-23 | Disposition: A | Discharge status: Home / Self Care | Payer: Medicare Other | Source: Ambulatory Visit | Attending: Orthopedic Surgery | Admitting: Orthopedic Surgery

## 2017-08-23 DIAGNOSIS — M1712 Unilateral primary osteoarthritis, left knee: Secondary | ICD-10-CM | POA: Insufficient documentation

## 2017-08-23 DIAGNOSIS — Z01812 Encounter for preprocedural laboratory examination: Secondary | ICD-10-CM | POA: Insufficient documentation

## 2017-08-23 LAB — APTT: aPTT: 27 seconds (ref 24–36)

## 2017-08-23 LAB — COMPREHENSIVE METABOLIC PANEL
ALT: 21 U/L (ref 17–63)
AST: 34 U/L (ref 15–41)
Albumin: 4.2 g/dL (ref 3.5–5.0)
Alkaline Phosphatase: 52 U/L (ref 38–126)
Anion gap: 10 (ref 5–15)
BUN: 24 mg/dL — ABNORMAL HIGH (ref 6–20)
CO2: 27 mmol/L (ref 22–32)
Calcium: 9.5 mg/dL (ref 8.9–10.3)
Chloride: 98 mmol/L — ABNORMAL LOW (ref 101–111)
Creatinine, Ser: 1.09 mg/dL (ref 0.61–1.24)
GFR calc Af Amer: >60 mL/min (ref >60)
GFR calc non Af Amer: >60 mL/min (ref >60)
Glucose, Bld: 118 mg/dL — ABNORMAL HIGH (ref 65–99)
Potassium: 3.7 mmol/L (ref 3.5–5.1)
Sodium: 135 mmol/L (ref 135–145)
Total Bilirubin: 0.7 mg/dL (ref 0.3–1.2)
Total Protein: 6.7 g/dL (ref 6.5–8.1)

## 2017-08-23 LAB — PROTIME-INR
INR: 1.06
Prothrombin Time: 13.7 seconds (ref 11.4–15.2)

## 2017-08-23 LAB — CBC
HCT: 43 % (ref 39.0–52.0)
Hemoglobin: 14.9 g/dL (ref 13.0–17.0)
MCH: 31.5 pg (ref 26.0–34.0)
MCHC: 34.7 g/dL (ref 30.0–36.0)
MCV: 90.9 fL (ref 78.0–100.0)
Platelets: 205 10*3/uL (ref 150–400)
RBC: 4.73 MIL/uL (ref 4.22–5.81)
RDW: 12.7 % (ref 11.5–15.5)
WBC: 8.6 10*3/uL (ref 4.0–10.5)

## 2017-08-23 LAB — SURGICAL PCR SCREEN
MRSA, PCR: NEGATIVE
Staphylococcus aureus: NEGATIVE

## 2017-08-23 LAB — HEMOGLOBIN A1C
Hgb A1c MFr Bld: 6.7 % — ABNORMAL HIGH (ref 4.8–5.6)
Mean Plasma Glucose: 145.59 mg/dL

## 2017-08-23 NOTE — Progress Notes (Signed)
08-23-17 CMP result routed to Dr. Wynelle Link for review

## 2017-08-25 ENCOUNTER — Telehealth: Payer: Self-pay | Admitting: *Deleted

## 2017-08-25 NOTE — Progress Notes (Addendum)
Surgical clearance from Dr. Moreen Fowler on  06-29-17 indicated that pt requires cardiac clearance for 09-04-17 surgery. Request for cardiac clearance faxed to Dr. Meda Coffee on 08-23-17 after's patient PST appt. Received called from Triage Nurse at [place] stating that patient was last seen on 01-18-17. As a result, pt would need to be seen before cardiac clearance can be provided.   Left voicemail for patient advising the above. Awaiting return call from patient.... Also left voice message for Velvet at Dr. Anne Fu office advising of the above as well.  Patient returned my call, and advised of the above. Pt indicated that he will contact Dr. Francesca Oman office.

## 2017-08-25 NOTE — Telephone Encounter (Signed)
Spoke with Dr Aluisio's office at [facility], and requested for them to send this pts cardiac clearance form for his upcoming knee surgery on 09/04/17. Informed Dr Aluisio's RN that the pt called our office requesting clearance for his upcoming knee surgery on 2/4, and noted their office hasn't sent [place] cardiac clearance form to review and advise on. Informed Dr. Anne Fu RN that per protocol and policy, before the pt can be cleared from a cardiac perspective from our Providers, she will need to fax this information to our office stating what type of surgery he will be having, when his surgery is, who the surgeon is, what type clearance they will require from Cardiology, and who to fax this clearance back to.  Advised the RN to fax this clearance to our office today at [phone], attention to Dr Meda Coffee and Pre-op Pool, so this can manually be entered and handled accordingly.  Informed the pt where we are in the process with this, and he will either hear back from our office as needed, and definitely by his surgeon's office, once cardiac clearance is complete.  Pt verbalized understanding and agrees with this plan.  RN from Dr Aluisio's office verbalized understanding and agrees with this plan  and states she will fax this information to our office today.

## 2017-08-29 ENCOUNTER — Telehealth: Payer: Self-pay | Admitting: Cardiology

## 2017-08-29 ENCOUNTER — Telehealth: Payer: Self-pay | Admitting: *Deleted

## 2017-08-29 NOTE — Telephone Encounter (Signed)
Walk in pt Form-Clearance dropped off. Placed in Triage box to be addressed.

## 2017-08-29 NOTE — Telephone Encounter (Signed)
Informed the pt that [facility] still has not sent his clearance form to our office, for our pre-op clearance Providers to review and advise on.  Informed the pt that I reached out to their office last week, and Dr Aluisio's RN endorsed she was getting the clearance faxed to our office last week, and this still hasn't been received.  Advised the pt to call their office now, and request for them to sent this to [place] ASAP.  Pt verbalized understanding and agrees with this plan.

## 2017-08-29 NOTE — Telephone Encounter (Signed)
   Primary Cardiologist:Katarina Meda Coffee, MD  Chart reviewed as part of pre-operative protocol coverage. Because of [name] past medical history and time since last visit, he/she will require a follow-up visit in order to better assess preoperative cardiovascular risk.  Last seen by Dr. Meda Coffee 01/23/2017.  Pre-op covering staff: - Please schedule appointment and call patient to inform them. - Please contact requesting surgeon's office via preferred method (i.e, phone, fax) to inform them of need for appointment prior to surgery.  [place], [place]  08/29/2017, 2:35 PM

## 2017-08-29 NOTE — Telephone Encounter (Signed)
   [facility] Pre-operative Risk Assessment    Request for surgical clearance:  1. What type of surgery is being performed? LEFT: TKA-MEDIAL AND LATERAL W/WO PATELLA RESURFACING   2. When is this surgery scheduled? 09/04/2017   3. What type of clearance is required (medical clearance vs. Pharmacy clearance to hold med vs. Both)? BOTH  4. Are there any medications that need to be held prior to surgery and how long? Advise on cardiac meds,   5. Practice name and name of physician performing surgery? [facility] Dr. Wynelle Link    6. What is your office phone and fax number? [phone] and [phone] attention Velvet McBride   7. Anesthesia type (None, local, MAC, general) ? Not specified   Nuala Alpha 08/29/2017, 1:17 PM  _________________________________________________________________   (provider comments below)

## 2017-08-29 NOTE — Telephone Encounter (Signed)
F/U call patient calling in reference to clearance form update.

## 2017-08-29 NOTE — Telephone Encounter (Signed)
Patient has an appointment on 08/31/17 with Cecilie Kicks NP for clearance. Left detailed message on Velvet McBride's voicemail of office visit for clearance.

## 2017-08-30 NOTE — Progress Notes (Signed)
Cardiology Office Note   Date:  08/31/2017   ID:  Timothy Dickson, DOB [birth year], MRN [id number]  PCP:  Antony Contras, MD  Cardiologist:  Dr. Meda Coffee    Chief Complaint  Patient presents with  . Pre-op Exam      History of Present Illness: Timothy Dickson is a 72 y.o. male who presents for pre-op -- to have Lt total knee done 09/04/17 with Dr. Maureen Ralphs.  He has a h/o PACs, PVCs, and SVT.  He has done very well over the past 7 months without any symptoms of arrhythmia.  He is pleased with flecainide.    01/23/2017 - seen by Dr. Meda Coffee, the patient states that he has been feeling great with occasional palpitations that can be lasting 5 seconds per day. No associated chest pain dizziness or shortness of breath. He has been compliant with his medications. He saw Dr. Rayann Heman in January 2018  were recommended to decrease flecainide to 100 mg a day, this was working for him for about 4 months but then he developed more frequent palpitations and be advised to increase 150 mg a day in 3 doses.  He denies any claudications, no lower extremity edema orthopnea or proximal nocturnal dyspnea. He had a right knee replacement. He is complaining of sharp intermittent right inguinal pain that comes and goes and he hasn't felt that in the last couple weeks. He is tolerating flecainide, carvedilol and Zetia.  History of ascending aortic aneurysm measuring 43 mm with no aortic insufficiency, now mild diastolic murmur, Dr. Meda Coffee repeated thoracic MRA and also cardiac MRI to evaluate for possible aortic insufficiency in June 2018 and it was stable.    Normal stress test 03/02/17 as well.  Today his knee is doing well have cortisol injection.  He can climb 2 sets of stairs without chest pain, 4 METS without pain or SOB.   He feels great and has very rare skipped beat.   Past Medical History:  Diagnosis Date  . Atrial tachycardia ([place])    documented by holter 2016. Followed by Dr. Liane Comber and Dr. Rayann Heman.  .  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 no further tx other than surgery [facility].  [name] Cirrhosis ([place])    [facility] dx, early stage- follow up visits at [facility].  [name] CRI (chronic renal insufficiency)    s/p R nephrectomy in 1999 for malignancy  . Diabetes mellitus without complication (H[facility])    NIDDM- dx. 3-4 years ago diet control  . Diverticulosis of colon    mild  . GERD (gastroesophageal reflux disease)   . Headache(784.0)   . Hepatitis C    s/p therapy [facility] (curative)/with [facility]  . History of BPH    no recent issues  . History of substance abuse    past history -none in 38 yrs  . Hyperglycemia   . Hypertension   . Osteoarthritis, knee    knees bilaterally   . Pneumonia 1975   hx of 35 years ago    Past Surgical History:  Procedure Laterality Date  . Arthroscopy right knee     may '11 (Dr Alvan Dame)  . Arthroscopy, left knee,    . history of liver biopsy  2007  . History of Nephrectomy Right 1999  . KNEE ARTHROSCOPY Left 10/10/2012   Procedure: LEFT KNEE ARTHROSCOPY WITH MENSCIAL DEBRIDEMENT AND CHONDROPLASTY;  Surgeon: Gearlean Alf, MD;  Location: [facility];  Service: Orthopedics;  Laterality: Left;  . left shoulder repair for  bone spur  1990's  . TOTAL KNEE ARTHROPLASTY Right 08/15/2016   Procedure: RIGHT TOTAL KNEE ARTHROPLASTY, CORTISONE INJECTION OF LEFT KNEE;  Surgeon: Gaynelle Arabian, MD;  Location: [facility];  Service: Orthopedics;  Laterality: Right;  . Transurethral needle ablation procedure    . TUNA procedure       Current Outpatient Medications  Medication Sig Dispense Refill  . carvedilol (COREG) 12.5 MG tablet Take 1 tablet (12.5 mg total) by mouth 2 (two) times daily with a meal. 180 tablet 3  . ezetimibe (ZETIA) 10 MG tablet Take 1 tablet (10 mg total) by mouth daily. 90 tablet 3  . flecainide (TAMBOCOR) 50 MG tablet Take 2 tablets by mouth in the a.m. And take 1 tablet by mouth in the afternoon    . gabapentin (NEURONTIN) 600 MG tablet Take 600 mg by mouth at  bedtime.    . hydrochlorothiazide (HYDRODIURIL) 25 MG tablet Take 1 tablet (25 mg total) by mouth daily. 90 tablet 3  . omeprazole (PRILOSEC) 20 MG capsule Take 20 mg by mouth 2 (two) times a week.     No current facility-administered medications for this visit.     Allergies:   Statins; Acetaminophen; Aspirin; and Ibuprofen    Social History:  The patient  reports that he quit smoking about 40 years ago. His smoking use included cigarettes. He has a 15.00 pack-year smoking history. he has never used smokeless tobacco. He reports that he does not drink alcohol or use drugs.   Family History:  The patient's family history includes Alcohol abuse in his father, other, and sister; Breast cancer in his sister; CVA in his mother; Cancer in his brother; Coronary artery disease in his mother; Diabetes in his sister; Esophageal cancer in his sister; Lung cancer in his sister; Prostate cancer in his brother.    ROS:  General:no colds or fevers, no weight changes Skin:no rashes or ulcers HEENT:no blurred vision, no congestion CV:see HPI PUL:see HPI GI:no diarrhea constipation or melena, no indigestion GU:no hematuria, no dysuria MS:no joint pain, no claudication Neuro:no syncope, no lightheadedness Endo:+ diabetes, no thyroid disease  Wt Readings from Last 3 Encounters:  08/31/17 207 lb 12.8 oz (94.3 kg)  08/23/17 208 lb 6 oz (94.5 kg)  03/02/17 201 lb (91.2 kg)     PHYSICAL EXAM: VS:  BP 114/78   Pulse 60   Ht 5\' 11"  (1.803 m)   Wt 207 lb 12.8 oz (94.3 kg)   BMI 28.98 kg/m  , BMI Body mass index is 28.98 kg/m. General:Pleasant affect, NAD Skin:Warm and dry, brisk capillary refill HEENT:normocephalic, sclera clear, mucus membranes moist Neck:supple, no JVD, no bruits  Heart:S1S2 RRR with 2/6 murmur,no gallup, rub or click Lungs:clear without rales, rhonchi, or wheezes MWU:XLKG, non tender, + BS, do not palpate liver spleen or masses Ext:no lower ext edema, 2+ pedal pulses, 2+  radial pulses Neuro:alert and oriented X 3, MAE, follows commands, + facial symmetry    EKG:  EKG is ordered today. The ekg ordered today demonstrates SR with PAC no acute changes.     Recent Labs: 08/23/2017: ALT 21; BUN 24; Creatinine, Ser 1.09; Hemoglobin 14.9; Platelets 205; Potassium 3.7; Sodium 135    Lipid Panel    Component Value Date/Time   CHOL 150 03/16/2016 1055   CHOL 206 (H) 12/21/2015 1522   TRIG 70 03/16/2016 1055   TRIG 161 (H) 12/21/2015 1522   HDL 57 03/16/2016 1055   HDL 45 12/21/2015 1522   CHOLHDL  2.6 03/16/2016 1055   VLDL 14 03/16/2016 1055   LDLCALC 79 03/16/2016 1055   LDLCALC 129 12/21/2015 1522       Other studies Reviewed: Additional studies/ records that were reviewed today include: . 03/02/17 myoview  Myocardial perfusion is normal. The study is normal. This is a low risk study. Overall left ventricular systolic function was normal. LV cavity size is normal. Nuclear stress EF: 57%. The left ventricular ejection fraction is normal (55-65%). There is no prior study for comparison.  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 Recommend follow-up aortic ultrasound in 5 years. This recommendation follows ACR consensus guidelines: White Paper of the ACR Incidental Findings Committee II on Vascular Findings. J Am Coll Radiol 2013; 10:789-794.  MRI 02/14/17 IMPRESSION: 1. Normal left ventricular size, with mild upper septal hypertrophy and normal systolic function (LVEF = 56%). 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  ASSESSMENT AND PLAN:  1.  Pre-op exam  Pt is  low risk for Total Lt knee,  Take flecainide AM of procedure.  Hold diuretic.  Stable to proceed.  2. SVT stable on coreg and flecainide  3.  HTN well controlled with HCTZ and Coreg.  4.  Hx of ascending aortic aneurysm stable on last MRI.  Keep follow up with Dr. Meda Coffee.   Current medicines are reviewed with the patient today.  The patient Has no concerns regarding medicines.  The following changes have been made:  See above Labs/ tests ordered today include:see above  Disposition:   FU:  see above  Signed, Cecilie Kicks, NP  08/31/2017 10:17 AM    [address], [place], [place], [place] Phone: [phone]; Fax: [phone]

## 2017-08-31 ENCOUNTER — Encounter: Payer: Self-pay | Admitting: Cardiology

## 2017-08-31 ENCOUNTER — Telehealth: Payer: Self-pay | Admitting: Cardiology

## 2017-08-31 ENCOUNTER — Ambulatory Visit: Payer: Medicare Other | Admitting: Cardiology

## 2017-08-31 VITALS — BP 114/78 | HR 60 | Ht 71.0 in | Wt 207.8 lb

## 2017-08-31 DIAGNOSIS — I712 Thoracic aortic aneurysm, without rupture: Secondary | ICD-10-CM | POA: Diagnosis not present

## 2017-08-31 DIAGNOSIS — I351 Nonrheumatic aortic (valve) insufficiency: Secondary | ICD-10-CM

## 2017-08-31 DIAGNOSIS — Z01818 Encounter for other preprocedural examination: Secondary | ICD-10-CM | POA: Diagnosis not present

## 2017-08-31 DIAGNOSIS — I471 Supraventricular tachycardia, unspecified: Secondary | ICD-10-CM

## 2017-08-31 DIAGNOSIS — I7121 Aneurysm of the ascending aorta, without rupture: Secondary | ICD-10-CM

## 2017-08-31 NOTE — Telephone Encounter (Signed)
   Primary Cardiologist: Ena Dawley, MD  Chart reviewed and patient seen and examined as part of pre-operative protocol coverage. Given past medical history, based on ACC/AHA guidelines, SMARAN GAUS would be at acceptable risk for the planned procedure without further cardiovascular testing.  He meets 4 mets of activity without angina or DOE.   He recently had negative stress test for ischemia and his hx. ascending aortic aneurysm measuring 43 mm with no aortic insufficiency was stable in June on MRI.   I will route this recommendation to the requesting party via Epic fax function.  Please call with questions.  Cecilie Kicks, NP 08/31/2017, 10:03 AM

## 2017-08-31 NOTE — Progress Notes (Signed)
CARDIOLOGY CLEARANCE , SEE NOTE IN Epic BY Cecilie Kicks, NP 08-31-17

## 2017-08-31 NOTE — Patient Instructions (Addendum)
Medication Instructions:  Your physician recommends that you continue on your current medications as directed. Please refer to the Current Medication list given to you today.   Labwork: None ordered  Testing/Procedures: None ordered  Follow-Up: Your physician wants you to follow-up in: [place]   Any Other Special Instructions Will Be Listed Below (If Applicable).     If you need a refill on your cardiac medications before your next appointment, please call your pharmacy.

## 2017-09-03 MED ORDER — TRANEXAMIC ACID 1000 MG/10ML IV SOLN
2000.0000 mg | Freq: Once | INTRAVENOUS | Status: DC
  Filled 2017-09-03: qty 20

## 2017-09-03 MED ORDER — BUPIVACAINE LIPOSOME 1.3 % IJ SUSP
20.0000 mL | Freq: Once | INTRAMUSCULAR | Status: DC
  Filled 2017-09-03: qty 20

## 2017-09-04 ENCOUNTER — Inpatient Hospital Stay (HOSPITAL_COMMUNITY): Payer: Medicare Other | Admitting: Certified Registered Nurse Anesthetist

## 2017-09-04 ENCOUNTER — Inpatient Hospital Stay (HOSPITAL_COMMUNITY)
Admission: RE | Admit: 2017-09-04 | Discharge: 2017-09-06 | DRG: 470 | Disposition: A | Discharge status: Home / Self Care | Payer: Medicare Other | Source: Ambulatory Visit | Attending: Orthopedic Surgery | Admitting: Orthopedic Surgery

## 2017-09-04 ENCOUNTER — Encounter (HOSPITAL_COMMUNITY)
Admission: RE | Disposition: A | Discharge status: Home / Self Care | Payer: Self-pay | Source: Ambulatory Visit | Attending: Orthopedic Surgery

## 2017-09-04 ENCOUNTER — Encounter (HOSPITAL_COMMUNITY): Payer: Self-pay | Admitting: *Deleted

## 2017-09-04 ENCOUNTER — Other Ambulatory Visit: Payer: Self-pay

## 2017-09-04 DIAGNOSIS — Z85528 Personal history of other malignant neoplasm of kidney: Secondary | ICD-10-CM | POA: Diagnosis not present

## 2017-09-04 DIAGNOSIS — Z87891 Personal history of nicotine dependence: Secondary | ICD-10-CM | POA: Diagnosis not present

## 2017-09-04 DIAGNOSIS — K219 Gastro-esophageal reflux disease without esophagitis: Secondary | ICD-10-CM | POA: Diagnosis not present

## 2017-09-04 DIAGNOSIS — M179 Osteoarthritis of knee, unspecified: Secondary | ICD-10-CM | POA: Diagnosis present

## 2017-09-04 DIAGNOSIS — Z6372 Alcoholism and drug addiction in family: Secondary | ICD-10-CM

## 2017-09-04 DIAGNOSIS — Z471 Aftercare following joint replacement surgery: Secondary | ICD-10-CM | POA: Diagnosis not present

## 2017-09-04 DIAGNOSIS — E1122 Type 2 diabetes mellitus with diabetic chronic kidney disease: Secondary | ICD-10-CM | POA: Diagnosis not present

## 2017-09-04 DIAGNOSIS — K7460 Unspecified cirrhosis of liver: Secondary | ICD-10-CM | POA: Diagnosis not present

## 2017-09-04 DIAGNOSIS — M1710 Unilateral primary osteoarthritis, unspecified knee: Secondary | ICD-10-CM | POA: Diagnosis present

## 2017-09-04 DIAGNOSIS — M1712 Unilateral primary osteoarthritis, left knee: Secondary | ICD-10-CM

## 2017-09-04 DIAGNOSIS — Z905 Acquired absence of kidney: Secondary | ICD-10-CM

## 2017-09-04 DIAGNOSIS — G8918 Other acute postprocedural pain: Secondary | ICD-10-CM | POA: Diagnosis not present

## 2017-09-04 DIAGNOSIS — E7800 Pure hypercholesterolemia, unspecified: Secondary | ICD-10-CM | POA: Diagnosis not present

## 2017-09-04 DIAGNOSIS — N400 Enlarged prostate without lower urinary tract symptoms: Secondary | ICD-10-CM | POA: Diagnosis not present

## 2017-09-04 DIAGNOSIS — Z8619 Personal history of other infectious and parasitic diseases: Secondary | ICD-10-CM | POA: Diagnosis not present

## 2017-09-04 DIAGNOSIS — I129 Hypertensive chronic kidney disease with stage 1 through stage 4 chronic kidney disease, or unspecified chronic kidney disease: Secondary | ICD-10-CM | POA: Diagnosis not present

## 2017-09-04 DIAGNOSIS — Z823 Family history of stroke: Secondary | ICD-10-CM

## 2017-09-04 DIAGNOSIS — M25762 Osteophyte, left knee: Secondary | ICD-10-CM | POA: Diagnosis present

## 2017-09-04 DIAGNOSIS — N189 Chronic kidney disease, unspecified: Secondary | ICD-10-CM | POA: Diagnosis present

## 2017-09-04 DIAGNOSIS — Z96652 Presence of left artificial knee joint: Secondary | ICD-10-CM | POA: Diagnosis not present

## 2017-09-04 HISTORY — PX: TOTAL KNEE ARTHROPLASTY: SHX125

## 2017-09-04 LAB — TYPE AND SCREEN
ABO/RH(D): A POS
Antibody Screen: NEGATIVE

## 2017-09-04 LAB — GLUCOSE, CAPILLARY: Glucose-Capillary: 110 mg/dL — ABNORMAL HIGH (ref 65–99)

## 2017-09-04 SURGERY — ARTHROPLASTY, KNEE, TOTAL
Anesthesia: Spinal | Site: Knee | Laterality: Left

## 2017-09-04 MED ORDER — GABAPENTIN 300 MG PO CAPS
600.0000 mg | ORAL_CAPSULE | Freq: Every day | ORAL | Status: DC
  Administered 2017-09-04 – 2017-09-05 (×2): 600 mg via ORAL
  Filled 2017-09-04 (×2): qty 2

## 2017-09-04 MED ORDER — HYDROMORPHONE HCL 2 MG PO TABS
2.0000 mg | ORAL_TABLET | ORAL | Status: DC | PRN
  Administered 2017-09-04: 2 mg via ORAL
  Filled 2017-09-04: qty 1

## 2017-09-04 MED ORDER — HYDROCHLOROTHIAZIDE 25 MG PO TABS
25.0000 mg | ORAL_TABLET | Freq: Every day | ORAL | Status: DC
  Administered 2017-09-05: 25 mg via ORAL
  Filled 2017-09-04 (×2): qty 1

## 2017-09-04 MED ORDER — ACETAMINOPHEN 10 MG/ML IV SOLN
1000.0000 mg | Freq: Once | INTRAVENOUS | Status: DC
  Filled 2017-09-04: qty 100

## 2017-09-04 MED ORDER — TRANEXAMIC ACID 1000 MG/10ML IV SOLN
INTRAVENOUS | Status: AC | PRN
  Administered 2017-09-04: 2000 mg via TOPICAL

## 2017-09-04 MED ORDER — METOCLOPRAMIDE HCL 5 MG/ML IJ SOLN: 5.0000 mg | Freq: Three times a day (TID) | INTRAMUSCULAR | Status: DC | PRN

## 2017-09-04 MED ORDER — DEXAMETHASONE SODIUM PHOSPHATE 10 MG/ML IJ SOLN
10.0000 mg | Freq: Once | INTRAMUSCULAR | Status: AC
  Administered 2017-09-05: 10 mg via INTRAVENOUS
  Filled 2017-09-04: qty 1

## 2017-09-04 MED ORDER — EPHEDRINE SULFATE-NACL 50-0.9 MG/10ML-% IV SOSY
PREFILLED_SYRINGE | INTRAVENOUS | Status: DC | PRN
  Administered 2017-09-04 (×5): 5 mg via INTRAVENOUS

## 2017-09-04 MED ORDER — ONDANSETRON HCL 4 MG/2ML IJ SOLN: 4.0000 mg | Freq: Four times a day (QID) | INTRAMUSCULAR | Status: DC | PRN

## 2017-09-04 MED ORDER — HYDROMORPHONE HCL 1 MG/ML IJ SOLN
1.0000 mg | INTRAMUSCULAR | Status: DC | PRN
  Administered 2017-09-04 – 2017-09-06 (×5): 1 mg via INTRAVENOUS
  Filled 2017-09-04 (×5): qty 1

## 2017-09-04 MED ORDER — SODIUM CHLORIDE 0.9 % IJ SOLN
INTRAMUSCULAR | Status: DC | PRN
  Administered 2017-09-04: 60 mL

## 2017-09-04 MED ORDER — DEXAMETHASONE SODIUM PHOSPHATE 10 MG/ML IJ SOLN
INTRAMUSCULAR | Status: AC
  Filled 2017-09-04: qty 1

## 2017-09-04 MED ORDER — MENTHOL 3 MG MT LOZG: 1.0000 | LOZENGE | OROMUCOSAL | Status: DC | PRN

## 2017-09-04 MED ORDER — PHENYLEPHRINE 40 MCG/ML (10ML) SYRINGE FOR IV PUSH (FOR BLOOD PRESSURE SUPPORT)
PREFILLED_SYRINGE | INTRAVENOUS | Status: DC | PRN
  Administered 2017-09-04 (×2): 80 ug via INTRAVENOUS
  Administered 2017-09-04 (×2): 40 ug via INTRAVENOUS

## 2017-09-04 MED ORDER — CEFAZOLIN SODIUM-DEXTROSE 2-4 GM/100ML-% IV SOLN
2.0000 g | Freq: Four times a day (QID) | INTRAVENOUS | Status: AC
  Administered 2017-09-04 (×2): 2 g via INTRAVENOUS
  Filled 2017-09-04 (×2): qty 100

## 2017-09-04 MED ORDER — SODIUM CHLORIDE 0.9 % IJ SOLN
INTRAMUSCULAR | Status: AC
  Filled 2017-09-04: qty 10

## 2017-09-04 MED ORDER — CARVEDILOL 12.5 MG PO TABS
12.5000 mg | ORAL_TABLET | Freq: Two times a day (BID) | ORAL | Status: DC
  Administered 2017-09-04 – 2017-09-05 (×3): 12.5 mg via ORAL
  Filled 2017-09-04 (×4): qty 1

## 2017-09-04 MED ORDER — EPHEDRINE 5 MG/ML INJ
INTRAVENOUS | Status: AC
  Filled 2017-09-04: qty 10

## 2017-09-04 MED ORDER — SODIUM CHLORIDE 0.9 % IJ SOLN
INTRAMUSCULAR | Status: AC
  Filled 2017-09-04: qty 50

## 2017-09-04 MED ORDER — SODIUM CHLORIDE 0.9 % IR SOLN
Status: DC | PRN
  Administered 2017-09-04: 1000 mL

## 2017-09-04 MED ORDER — BUPIVACAINE LIPOSOME 1.3 % IJ SUSP
INTRAMUSCULAR | Status: DC | PRN
  Administered 2017-09-04: 20 mL

## 2017-09-04 MED ORDER — DIPHENHYDRAMINE HCL 12.5 MG/5ML PO ELIX: 12.5000 mg | ORAL_SOLUTION | ORAL | Status: DC | PRN

## 2017-09-04 MED ORDER — METHOCARBAMOL 1000 MG/10ML IJ SOLN
500.0000 mg | Freq: Four times a day (QID) | INTRAVENOUS | Status: DC | PRN
  Filled 2017-09-04: qty 5

## 2017-09-04 MED ORDER — BISACODYL 10 MG RE SUPP: 10.0000 mg | Freq: Every day | RECTAL | Status: DC | PRN

## 2017-09-04 MED ORDER — EZETIMIBE 10 MG PO TABS
10.0000 mg | ORAL_TABLET | Freq: Every day | ORAL | Status: DC
  Administered 2017-09-05 – 2017-09-06 (×2): 10 mg via ORAL
  Filled 2017-09-04 (×2): qty 1

## 2017-09-04 MED ORDER — LACTATED RINGERS IV SOLN: INTRAVENOUS | Status: DC

## 2017-09-04 MED ORDER — DOCUSATE SODIUM 100 MG PO CAPS
100.0000 mg | ORAL_CAPSULE | Freq: Two times a day (BID) | ORAL | Status: DC
  Administered 2017-09-04 – 2017-09-06 (×4): 100 mg via ORAL
  Filled 2017-09-04 (×4): qty 1

## 2017-09-04 MED ORDER — STERILE WATER FOR IRRIGATION IR SOLN
Status: DC | PRN
  Administered 2017-09-04: 2000 mL

## 2017-09-04 MED ORDER — BUPIVACAINE-EPINEPHRINE (PF) 0.5% -1:200000 IJ SOLN
INTRAMUSCULAR | Status: DC | PRN
  Administered 2017-09-04: 5 mL via PERINEURAL

## 2017-09-04 MED ORDER — ONDANSETRON HCL 4 MG/2ML IJ SOLN
INTRAMUSCULAR | Status: AC
  Filled 2017-09-04: qty 2

## 2017-09-04 MED ORDER — LACTATED RINGERS IV SOLN
INTRAVENOUS | Status: DC
  Administered 2017-09-04 (×2): via INTRAVENOUS

## 2017-09-04 MED ORDER — METOCLOPRAMIDE HCL 5 MG PO TABS: 5.0000 mg | ORAL_TABLET | Freq: Three times a day (TID) | ORAL | Status: DC | PRN

## 2017-09-04 MED ORDER — LIDOCAINE 2% (20 MG/ML) 5 ML SYRINGE
INTRAMUSCULAR | Status: AC
  Filled 2017-09-04: qty 5

## 2017-09-04 MED ORDER — MIDAZOLAM HCL 2 MG/2ML IJ SOLN
1.0000 mg | INTRAMUSCULAR | Status: DC
  Administered 2017-09-04: 2 mg via INTRAVENOUS
  Filled 2017-09-04: qty 2

## 2017-09-04 MED ORDER — DEXAMETHASONE SODIUM PHOSPHATE 10 MG/ML IJ SOLN
10.0000 mg | Freq: Once | INTRAMUSCULAR | Status: AC
  Administered 2017-09-04: 10 mg via INTRAVENOUS

## 2017-09-04 MED ORDER — 0.9 % SODIUM CHLORIDE (POUR BTL) OPTIME
TOPICAL | Status: DC | PRN
  Administered 2017-09-04: 1000 mL

## 2017-09-04 MED ORDER — PROPOFOL 10 MG/ML IV BOLUS
INTRAVENOUS | Status: AC
  Filled 2017-09-04: qty 40

## 2017-09-04 MED ORDER — CEFAZOLIN SODIUM-DEXTROSE 2-4 GM/100ML-% IV SOLN
2.0000 g | INTRAVENOUS | Status: AC
  Administered 2017-09-04: 2 g via INTRAVENOUS
  Filled 2017-09-04: qty 100

## 2017-09-04 MED ORDER — FENTANYL CITRATE (PF) 100 MCG/2ML IJ SOLN
50.0000 ug | INTRAMUSCULAR | Status: DC
  Administered 2017-09-04 (×2): 50 ug via INTRAVENOUS
  Filled 2017-09-04: qty 2

## 2017-09-04 MED ORDER — PHENYLEPHRINE 40 MCG/ML (10ML) SYRINGE FOR IV PUSH (FOR BLOOD PRESSURE SUPPORT)
PREFILLED_SYRINGE | INTRAVENOUS | Status: AC
  Filled 2017-09-04: qty 10

## 2017-09-04 MED ORDER — ONDANSETRON HCL 4 MG/2ML IJ SOLN
INTRAMUSCULAR | Status: DC | PRN
  Administered 2017-09-04: 4 mg via INTRAVENOUS

## 2017-09-04 MED ORDER — FLEET ENEMA 7-19 GM/118ML RE ENEM: 1.0000 | ENEMA | Freq: Once | RECTAL | Status: DC | PRN

## 2017-09-04 MED ORDER — HYDROMORPHONE HCL 2 MG PO TABS
4.0000 mg | ORAL_TABLET | ORAL | Status: DC | PRN
  Administered 2017-09-04 – 2017-09-06 (×8): 4 mg via ORAL
  Filled 2017-09-04 (×8): qty 2

## 2017-09-04 MED ORDER — FLECAINIDE ACETATE 50 MG PO TABS
50.0000 mg | ORAL_TABLET | Freq: Every evening | ORAL | Status: DC
  Administered 2017-09-04 – 2017-09-05 (×2): 50 mg via ORAL
  Filled 2017-09-04 (×2): qty 1

## 2017-09-04 MED ORDER — ROPIVACAINE HCL 7.5 MG/ML IJ SOLN
INTRAMUSCULAR | Status: DC | PRN
  Administered 2017-09-04: 20 mL via PERINEURAL

## 2017-09-04 MED ORDER — RIVAROXABAN 10 MG PO TABS
10.0000 mg | ORAL_TABLET | Freq: Every day | ORAL | Status: DC
  Administered 2017-09-05 – 2017-09-06 (×2): 10 mg via ORAL
  Filled 2017-09-04 (×2): qty 1

## 2017-09-04 MED ORDER — LIDOCAINE 2% (20 MG/ML) 5 ML SYRINGE
INTRAMUSCULAR | Status: DC | PRN
  Administered 2017-09-04: 50 mg via INTRAVENOUS

## 2017-09-04 MED ORDER — SODIUM CHLORIDE 0.9 % IV SOLN
INTRAVENOUS | Status: DC
  Administered 2017-09-04 – 2017-09-05 (×2): via INTRAVENOUS

## 2017-09-04 MED ORDER — METHOCARBAMOL 500 MG PO TABS
500.0000 mg | ORAL_TABLET | Freq: Four times a day (QID) | ORAL | Status: DC | PRN
  Administered 2017-09-05: 500 mg via ORAL
  Filled 2017-09-04 (×2): qty 1

## 2017-09-04 MED ORDER — PROPOFOL 500 MG/50ML IV EMUL
INTRAVENOUS | Status: DC | PRN
  Administered 2017-09-04: 100 ug/kg/min via INTRAVENOUS

## 2017-09-04 MED ORDER — POLYETHYLENE GLYCOL 3350 17 G PO PACK: 17.0000 g | PACK | Freq: Every day | ORAL | Status: DC | PRN

## 2017-09-04 MED ORDER — ONDANSETRON HCL 4 MG PO TABS: 4.0000 mg | ORAL_TABLET | Freq: Four times a day (QID) | ORAL | Status: DC | PRN

## 2017-09-04 MED ORDER — FLECAINIDE ACETATE 100 MG PO TABS
100.0000 mg | ORAL_TABLET | Freq: Every day | ORAL | Status: DC
  Administered 2017-09-05 – 2017-09-06 (×2): 100 mg via ORAL
  Filled 2017-09-04 (×2): qty 1

## 2017-09-04 MED ORDER — PHENOL 1.4 % MT LIQD: 1.0000 | OROMUCOSAL | Status: DC | PRN

## 2017-09-04 MED ORDER — GABAPENTIN 300 MG PO CAPS
300.0000 mg | ORAL_CAPSULE | Freq: Once | ORAL | Status: AC
  Administered 2017-09-04: 300 mg via ORAL
  Filled 2017-09-04: qty 1

## 2017-09-04 MED ORDER — CHLORHEXIDINE GLUCONATE 4 % EX LIQD: 60.0000 mL | Freq: Once | CUTANEOUS | Status: DC

## 2017-09-04 SURGICAL SUPPLY — 52 items
BAG DECANTER FOR FLEXI CONT (MISCELLANEOUS) ×2 IMPLANT
BAG ZIPLOCK 12X15 (MISCELLANEOUS) ×2 IMPLANT
BANDAGE ACE 6X5 VEL STRL LF (GAUZE/BANDAGES/DRESSINGS) ×2 IMPLANT
BLADE SAG 18X100X1.27 (BLADE) ×2 IMPLANT
BLADE SAW SGTL 11.0X1.19X90.0M (BLADE) ×2 IMPLANT
BNDG CONFORM 6X.82 1P STRL (GAUZE/BANDAGES/DRESSINGS) ×2 IMPLANT
BOWL SMART MIX CTS (DISPOSABLE) ×2 IMPLANT
CAPT KNEE TOTAL 3 ATTUNE ×2 IMPLANT
CEMENT HV SMART SET (Cement) ×4 IMPLANT
COVER SURGICAL LIGHT HANDLE (MISCELLANEOUS) ×2 IMPLANT
CUFF TOURN SGL QUICK 34 (TOURNIQUET CUFF) ×1
CUFF TRNQT CYL 34X4X40X1 (TOURNIQUET CUFF) ×1 IMPLANT
DECANTER SPIKE VIAL GLASS SM (MISCELLANEOUS) ×2 IMPLANT
DRAPE TOP 10253 STERILE (DRAPES) ×2 IMPLANT
DRAPE U-SHAPE 47X51 STRL (DRAPES) ×2 IMPLANT
DRSG ADAPTIC 3X8 NADH LF (GAUZE/BANDAGES/DRESSINGS) ×2 IMPLANT
DRSG PAD ABDOMINAL 8X10 ST (GAUZE/BANDAGES/DRESSINGS) ×2 IMPLANT
DURAPREP 26ML APPLICATOR (WOUND CARE) ×2 IMPLANT
ELECT REM PT RETURN 15FT ADLT (MISCELLANEOUS) ×2 IMPLANT
EVACUATOR 1/8 PVC DRAIN (DRAIN) ×2 IMPLANT
GAUZE SPONGE 4X4 12PLY STRL (GAUZE/BANDAGES/DRESSINGS) ×2 IMPLANT
GLOVE BIO SURGEON STRL SZ7.5 (GLOVE) IMPLANT
GLOVE BIO SURGEON STRL SZ8 (GLOVE) ×2 IMPLANT
GLOVE BIOGEL PI IND STRL 6.5 (GLOVE) IMPLANT
GLOVE BIOGEL PI IND STRL 8 (GLOVE) ×1 IMPLANT
GLOVE BIOGEL PI INDICATOR 6.5 (GLOVE)
GLOVE BIOGEL PI INDICATOR 8 (GLOVE) ×1
GLOVE SURG SS PI 6.5 STRL IVOR (GLOVE) IMPLANT
GOWN STRL REUS W/TWL LRG LVL3 (GOWN DISPOSABLE) ×2 IMPLANT
GOWN STRL REUS W/TWL XL LVL3 (GOWN DISPOSABLE) IMPLANT
HANDPIECE INTERPULSE COAX TIP (DISPOSABLE) ×1
IMMOBILIZER KNEE 20 (SOFTGOODS) ×2
IMMOBILIZER KNEE 20 THIGH 36 (SOFTGOODS) ×1 IMPLANT
MANIFOLD NEPTUNE II (INSTRUMENTS) ×2 IMPLANT
NS IRRIG 1000ML POUR BTL (IV SOLUTION) ×2 IMPLANT
PACK TOTAL KNEE CUSTOM (KITS) ×2 IMPLANT
PAD ABD 8X10 STRL (GAUZE/BANDAGES/DRESSINGS) ×2 IMPLANT
PADDING CAST COTTON 6X4 STRL (CAST SUPPLIES) ×6 IMPLANT
POSITIONER SURGICAL ARM (MISCELLANEOUS) ×2 IMPLANT
SET HNDPC FAN SPRY TIP SCT (DISPOSABLE) ×1 IMPLANT
STRIP CLOSURE SKIN 1/2X4 (GAUZE/BANDAGES/DRESSINGS) ×4 IMPLANT
SUT MNCRL AB 4-0 PS2 18 (SUTURE) ×2 IMPLANT
SUT STRATAFIX 0 PDS 27 VIOLET (SUTURE) ×2
SUT VIC AB 2-0 CT1 27 (SUTURE) ×3
SUT VIC AB 2-0 CT1 TAPERPNT 27 (SUTURE) ×3 IMPLANT
SUTURE STRATFX 0 PDS 27 VIOLET (SUTURE) ×1 IMPLANT
SYR 30ML LL (SYRINGE) ×4 IMPLANT
TAPE STRIPS DRAPE STRL (GAUZE/BANDAGES/DRESSINGS) ×2 IMPLANT
TRAY FOLEY W/METER SILVER 16FR (SET/KITS/TRAYS/PACK) ×2 IMPLANT
WATER STERILE IRR 1000ML POUR (IV SOLUTION) ×4 IMPLANT
WRAP KNEE MAXI GEL POST OP (GAUZE/BANDAGES/DRESSINGS) ×2 IMPLANT
YANKAUER SUCT BULB TIP 10FT TU (MISCELLANEOUS) ×2 IMPLANT

## 2017-09-04 NOTE — Evaluation (Addendum)
Physical Therapy Evaluation Patient Details Name: Timothy Dickson MRN: [id number] DOB: 11/23/1945 Today's Date: 09/04/2017   History of Present Illness  Patient is a 72 y/o male admitted for L TKA.  PMH positive for Hep C, HTN, R TKA and irregular heartbeat.  Clinical Impression  Patient presents with decreased mobility due to deficits listed in PT problem list.  He will benefit from skilled PT in the acute setting to allow return home with family support and follow up PT as directed by MD.     Follow Up Recommendations DC plan and follow up therapy as arranged by surgeon;Supervision for mobility/OOB    Equipment Recommendations  None recommended by PT    Recommendations for Other Services       Precautions / Restrictions Precautions Precautions: Fall;Knee Restrictions Weight Bearing Restrictions: Yes LLE Weight Bearing: Weight bearing as tolerated      Mobility  Bed Mobility Overal bed mobility: Needs Assistance Bed Mobility: Supine to Sit     Supine to sit: Min guard;HOB elevated     General bed mobility comments: for L LE  Transfers Overall transfer level: Needs assistance Equipment used: Rolling walker (2 wheeled) Transfers: Sit to/from Stand Sit to Stand: Min assist         General transfer comment: cues for hand placement  Ambulation/Gait Ambulation/Gait assistance: Min assist;+2 safety/equipment Ambulation Distance (Feet): 20 Feet Assistive device: Rolling walker (2 wheeled) Gait Pattern/deviations: Step-through pattern;Step-to pattern;Decreased step length - right;Antalgic     General Gait Details: cues for sequence, walker safety +2 for equipment  Stairs            Wheelchair Mobility    Modified Rankin (Stroke Patients Only)       Balance Overall balance assessment: Needs assistance   Sitting balance-Leahy Scale: Good       Standing balance-Leahy Scale: Fair Standing balance comment: static without UE support                              Pertinent Vitals/Pain Pain Assessment: 0-10 Pain Score: 6  Pain Location: L knee with ambulation Pain Descriptors / Indicators: Sore Pain Intervention(s): Monitored during session;Repositioned;Ice applied    Home Living Family/patient expects to be discharged to:: Private residence Living Arrangements: Spouse/significant other Available Help at Discharge: Family Type of Home: House Home Access: Stairs to enter Entrance Stairs-Rails: None Entrance Stairs-Number of Steps: 1.5 Home Layout: Two level;Able to live on main level with bedroom/bathroom Home Equipment: Gilford Rile - 2 wheels;Cane - single point;Bedside commode      Prior Function Level of Independence: Independent               Hand Dominance   Dominant Hand: Right    Extremity/Trunk Assessment   Upper Extremity Assessment Upper Extremity Assessment: Overall WFL for tasks assessed    Lower Extremity Assessment Lower Extremity Assessment: LLE deficits/detail LLE Deficits / Details: AROM grossly 70 degrees flexion 10 degrees extension, quads at least 3/5       Communication   Communication: No difficulties  Cognition Arousal/Alertness: Awake/alert Behavior During Therapy: WFL for tasks assessed/performed Overall Cognitive Status: Within Functional Limits for tasks assessed                                        General Comments General comments (skin integrity, edema, etc.): wife in room and  supportive    Exercises Total Joint Exercises Ankle Circles/Pumps: AROM;Both;Supine;10 reps Quad Sets: AROM;Left;5 reps;Supine Heel Slides: AROM;Left;5 reps;Supine   Assessment/Plan    PT Assessment Patient needs continued PT services  PT Problem List Decreased strength;Decreased mobility;Decreased range of motion;Decreased activity tolerance;Decreased balance;Pain;Decreased knowledge of use of DME       PT Treatment Interventions Gait training;Therapeutic activities;DME  instruction;Functional mobility training;Balance training;Therapeutic exercise;Stair training;Patient/family education    PT Goals (Current goals can be found in the Care Plan section)  Acute Rehab PT Goals Patient Stated Goal: To go home PT Goal Formulation: With patient/family Time For Goal Achievement: 09/06/17 Potential to Achieve Goals: Good    Frequency 7X/week   Barriers to discharge        Co-evaluation               AM-PAC PT "6 Clicks" Daily Activity  Outcome Measure Difficulty turning over in bed (including adjusting bedclothes, sheets and blankets)?: A Little Difficulty moving from lying on back to sitting on the side of the bed? : A Lot Difficulty sitting down on and standing up from a chair with arms (e.g., wheelchair, bedside commode, etc,.)?: Unable Help needed moving to and from a bed to chair (including a wheelchair)?: A Little Help needed walking in hospital room?: A Little Help needed climbing 3-5 steps with a railing? : Total 6 Click Score: 13    End of Session Equipment Utilized During Treatment: Gait belt;Left knee immobilizer Activity Tolerance: Patient tolerated treatment well Patient left: in chair;with call bell/phone within reach;with family/visitor present Nurse Communication: Mobility status PT Visit Diagnosis: Difficulty in walking, not elsewhere classified (R26.2);Pain Pain - Right/Left: Left Pain - part of body: Knee    Time: 0258-5277 PT Time Calculation (min) (ACUTE ONLY): 23 min   Charges:   PT Evaluation $PT Eval Low Complexity: 1 Low PT Treatments $Gait Training: 8-22 mins   PT G CodesMagda Kiel, [place] [phone] 09/04/2017   Reginia Naas 09/04/2017, 5:15 PM

## 2017-09-04 NOTE — Anesthesia Postprocedure Evaluation (Signed)
Anesthesia Post Note  Patient: Timothy Dickson  Procedure(s) Performed: LEFT TOTAL KNEE ARTHROPLASTY (Left Knee)     Patient location during evaluation: PACU Anesthesia Type: Spinal Level of consciousness: awake Pain management: satisfactory to patient Vital Signs Assessment: post-procedure vital signs reviewed and stable Respiratory status: spontaneous breathing Cardiovascular status: blood pressure returned to baseline Postop Assessment: no headache and spinal receding Anesthetic complications: no    Last Vitals:  Vitals:   09/04/17 1703 09/04/17 1736  BP: 140/81 121/74  Pulse: 63 63  Resp:  16  Temp:  36.4 C  SpO2:  97%    Last Pain:  Vitals:   09/04/17 1736  TempSrc: Oral  PainSc:                  Riccardo Dubin

## 2017-09-04 NOTE — Transfer of Care (Signed)
Immediate Anesthesia Transfer of Care Note  Patient: Timothy Dickson  Procedure(s) Performed: LEFT TOTAL KNEE ARTHROPLASTY (Left Knee)  Patient Location: PACU  Anesthesia Type:Spinal  Level of Consciousness: awake, alert , oriented and patient cooperative  Airway & Oxygen Therapy: Patient Spontanous Breathing and Patient connected to face mask  Post-op Assessment: Report given to RN and Post -op Vital signs reviewed and stable  Post vital signs: Reviewed and stable  Last Vitals:  Vitals:   09/04/17 0904 09/04/17 0909  BP: 111/77 106/80  Pulse: (!) 59 (!) 58  Resp: 13 12  Temp:    SpO2: 94% 97%    Last Pain:  Vitals:   09/04/17 0909  TempSrc:   PainSc: 0-No pain      Patients Stated Pain Goal: 4 (01/74/94 4967)  Complications: No apparent anesthesia complications

## 2017-09-04 NOTE — Discharge Instructions (Addendum)
° °Dr. Frank Aluisio °Total Joint Specialist °[facility] °[address] °[place], [place] [address] °[phone] ° °TOTAL KNEE REPLACEMENT POSTOPERATIVE DIRECTIONS ° °Knee Rehabilitation, Guidelines Following Surgery  °Results after knee surgery are often greatly improved when you follow the exercise, range of motion and muscle strengthening exercises prescribed by your doctor. Safety measures are also important to protect the knee from further injury. Any time any of these exercises cause you to have increased pain or swelling in your knee joint, decrease the amount until you are comfortable again and slowly increase them. If you have problems or questions, call your caregiver or physical therapist for advice.  ° °HOME CARE INSTRUCTIONS  °Remove items at home which could result in a fall. This includes throw rugs or furniture in walking pathways.  °· ICE to the affected knee every three hours for 30 minutes at a time and then as needed for pain and swelling.  Continue to use ice on the knee for pain and swelling from surgery. You may notice swelling that will progress down to the foot and ankle.  This is normal after surgery.  Elevate the leg when you are not up walking on it.   °· Continue to use the breathing machine which will help keep your temperature down.  It is common for your temperature to cycle up and down following surgery, especially at night when you are not up moving around and exerting yourself.  The breathing machine keeps your lungs expanded and your temperature down. °· Do not place pillow under knee, focus on keeping the knee straight while resting ° °DIET °You may resume your previous home diet once your are discharged from the hospital. ° °DRESSING / WOUND CARE / SHOWERING °You may shower 3 days after surgery, but keep the wounds dry during showering.  You may use an occlusive plastic wrap (Press'n Seal for example), NO SOAKING/SUBMERGING IN THE BATHTUB.  If the bandage gets  wet, change with a clean dry gauze.  If the incision gets wet, pat the wound dry with a clean towel. °You may start showering once you are discharged home but do not submerge the incision under water. Just pat the incision dry and apply a dry gauze dressing on daily. °Change the surgical dressing daily and reapply a dry dressing each time. ° °ACTIVITY °Walk with your walker as instructed. °Use walker as long as suggested by your caregivers. °Avoid periods of inactivity such as sitting longer than an hour when not asleep. This helps prevent blood clots.  °You may resume a sexual relationship in one month or when given the OK by your doctor.  °You may return to work once you are cleared by your doctor.  °Do not drive a car for 6 weeks or until released by you surgeon.  °Do not drive while taking narcotics. ° °WEIGHT BEARING °Weight bearing as tolerated with assist device (walker, cane, etc) as directed, use it as long as suggested by your surgeon or therapist, typically at least 4-6 weeks. ° °POSTOPERATIVE CONSTIPATION PROTOCOL °Constipation - defined medically as fewer than three stools per week and severe constipation as less than one stool per week. ° °One of the most common issues patients have following surgery is constipation.  Even if you have a regular bowel pattern at home, your normal regimen is likely to be disrupted due to multiple reasons following surgery.  Combination of anesthesia, postoperative narcotics, change in appetite and fluid intake all can affect your bowels.    In order to avoid complications following surgery, here are some recommendations in order to help you during your recovery period. ° °Colace (docusate) - Pick up an over-the-counter form of Colace or another stool softener and take twice a day as long as you are requiring postoperative pain medications.  Take with a full glass of water daily.  If you experience loose stools or diarrhea, hold the colace until you stool forms back up.  If  your symptoms do not get better within 1 week or if they get worse, check with your doctor. ° °Dulcolax (bisacodyl) - Pick up over-the-counter and take as directed by the product packaging as needed to assist with the movement of your bowels.  Take with a full glass of water.  Use this product as needed if not relieved by Colace only.  ° °MiraLax (polyethylene glycol) - Pick up over-the-counter to have on hand.  MiraLax is a solution that will increase the amount of water in your bowels to assist with bowel movements.  Take as directed and can mix with a glass of water, juice, soda, coffee, or tea.  Take if you go more than two days without a movement. °Do not use MiraLax more than once per day. Call your doctor if you are still constipated or irregular after using this medication for 7 days in a row. ° °If you continue to have problems with postoperative constipation, please contact the office for further assistance and recommendations.  If you experience "the worst abdominal pain ever" or develop nausea or vomiting, please contact the office immediatly for further recommendations for treatment. ° °ITCHING ° If you experience itching with your medications, try taking only a single pain pill, or even half a pain pill at a time.  You can also use Benadryl over the counter for itching or also to help with sleep.  ° °TED HOSE STOCKINGS °Wear the elastic stockings on both legs for three weeks following surgery during the day but you may remove then at night for sleeping. ° °MEDICATIONS °See your medication summary on the “After Visit Summary” that the nursing staff will review with you prior to discharge.  You may have some home medications which will be placed on hold until you complete the course of blood thinner medication.  It is important for you to complete the blood thinner medication as prescribed by your surgeon.  Continue your approved medications as instructed at time of discharge. ° °PRECAUTIONS °If you  experience chest pain or shortness of breath - call 911 immediately for transfer to the hospital emergency department.  °If you develop a fever greater that 101 F, purulent drainage from wound, increased redness or drainage from wound, foul odor from the wound/dressing, or calf pain - CONTACT YOUR SURGEON.   °                                                °FOLLOW-UP APPOINTMENTS °Make sure you keep all of your appointments after your operation with your surgeon and caregivers. You should call the office at the above phone number and make an appointment for approximately two weeks after the date of your surgery or on the date instructed by your surgeon outlined in the "After Visit Summary". ° ° °RANGE OF MOTION AND STRENGTHENING EXERCISES  °Rehabilitation of the knee is important following a knee injury or   an operation. After just a few days of immobilization, the muscles of the thigh which control the knee become weakened and shrink (atrophy). Knee exercises are designed to build up the tone and strength of the thigh muscles and to improve knee motion. Often times heat used for twenty to thirty minutes before working out will loosen up your tissues and help with improving the range of motion but do not use heat for the first two weeks following surgery. These exercises can be done on a training (exercise) mat, on the floor, on a table or on a bed. Use what ever works the best and is most comfortable for you Knee exercises include:  °Leg Lifts - While your knee is still immobilized in a splint or cast, you can do straight leg raises. Lift the leg to 60 degrees, hold for 3 sec, and slowly lower the leg. Repeat 10-20 times 2-3 times daily. Perform this exercise against resistance later as your knee gets better.  °Quad and Hamstring Sets - Tighten up the muscle on the front of the thigh (Quad) and hold for 5-10 sec. Repeat this 10-20 times hourly. Hamstring sets are done by pushing the foot backward against an object  and holding for 5-10 sec. Repeat as with quad sets.  °· Leg Slides: Lying on your back, slowly slide your foot toward your buttocks, bending your knee up off the floor (only go as far as is comfortable). Then slowly slide your foot back down until your leg is flat on the floor again. °· Angel Wings: Lying on your back spread your legs to the side as far apart as you can without causing discomfort.  °A rehabilitation program following serious knee injuries can speed recovery and prevent re-injury in the future due to weakened muscles. Contact your doctor or a physical therapist for more information on knee rehabilitation.  ° °IF YOU ARE TRANSFERRED TO A SKILLED REHAB FACILITY °If the patient is transferred to a skilled rehab facility following release from the hospital, a list of the current medications will be sent to the facility for the patient to continue.  When discharged from the skilled rehab facility, please have the facility set up the patient's Home Health Physical Therapy prior to being released. Also, the skilled facility will be responsible for providing the patient with their medications at time of release from the facility to include their pain medication, the muscle relaxants, and their blood thinner medication. If the patient is still at the rehab facility at time of the two week follow up appointment, the skilled rehab facility will also need to assist the patient in arranging follow up appointment in our office and any transportation needs. ° °MAKE SURE YOU:  °Understand these instructions.  °Get help right away if you are not doing well or get worse.  ° ° °Pick up stool softner and laxative for home use following surgery while on pain medications. °Do not submerge incision under water. °Please use good hand washing techniques while changing dressing each day. °May shower starting three days after surgery. °Please use a clean towel to pat the incision dry following showers. °Continue to use ice for  pain and swelling after surgery. °Do not use any lotions or creams on the incision until instructed by your surgeon. ° °Take Xarelto for two and a half more weeks following discharge from the hospital, then discontinue Xarelto. °Once the patient has completed the blood thinner regimen, then take a Baby 81 mg Aspirin daily for three   more weeks. ° ° ° °Information on my medicine - XARELTO® (Rivaroxaban) ° °Why was Xarelto® prescribed for you? °Xarelto® was prescribed for you to reduce the risk of blood clots forming after orthopedic surgery. The medical term for these abnormal blood clots is venous thromboembolism (VTE). ° °What do you need to know about xarelto® ? °Take your Xarelto® ONCE DAILY at the same time every day. °You may take it either with or without food. ° °If you have difficulty swallowing the tablet whole, you may crush it and mix in applesauce just prior to taking your dose. ° °Take Xarelto® exactly as prescribed by your doctor and DO NOT stop taking Xarelto® without talking to the doctor who prescribed the medication.  Stopping without other VTE prevention medication to take the place of Xarelto® may increase your risk of developing a clot. ° °After discharge, you should have regular check-up appointments with your healthcare provider that is prescribing your Xarelto®.   ° °What do you do if you miss a dose? °If you miss a dose, take it as soon as you remember on the same day then continue your regularly scheduled once daily regimen the next day. Do not take two doses of Xarelto® on the same day.  ° °Important Safety Information °A possible side effect of Xarelto® is bleeding. You should call your healthcare provider right away if you experience any of the following: °? Bleeding from an injury or your nose that does not stop. °? Unusual colored urine (red or dark brown) or unusual colored stools (red or black). °? Unusual bruising for unknown reasons. °? A serious fall or if you hit your head (even  if there is no bleeding). ° °Some medicines may interact with Xarelto® and might increase your risk of bleeding while on Xarelto®. To help avoid this, consult your healthcare provider or pharmacist prior to using any new prescription or non-prescription medications, including herbals, vitamins, non-steroidal anti-inflammatory drugs (NSAIDs) and supplements. ° °This website has more information on Xarelto®: www.xarelto.com. ° ° ° °

## 2017-09-04 NOTE — Op Note (Signed)
OPERATIVE REPORT-TOTAL KNEE ARTHROPLASTY   Pre-operative diagnosis- Osteoarthritis  Left knee(s)  Post-operative diagnosis- Osteoarthritis Left knee(s)  Procedure-  Left  Total Knee Arthroplasty (Depuy Attune)  Surgeon- Dione Plover. Cate Oravec, MD  Assistant- Ardeen Jourdain, PA_C   Anesthesia-  Adductor canal block and spinal  EBL-25 mL   Drains Hemovac  Tourniquet time-  Total Tourniquet Time Documented: Thigh (Left) - 40 minutes Total: Thigh (Left) - 40 minutes     Complications- None  Condition-PACU - hemodynamically stable.   Brief Clinical Note   Timothy Dickson is a 72 y.o. 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 He has constant pain, with activity and at rest and significant functional deficits with difficulties even with ADLs. He has had extensive non-op management including analgesics, injections of cortisone and viscosupplements, and home exercise program, but remains in significant pain with significant dysfunction. Radiographs show bone on bone arthritis medial and patellofemoral. He presents now for left Total Knee Arthroplasty.    Procedure in detail---   The patient is brought into the operating room and positioned supine on the operating table. After successful administration of  Adductor canal block and spinal,   a tourniquet is placed high on the  Left thigh(s) and the lower extremity is prepped and draped in the usual sterile fashion. Time out is performed by the operating team and then the  Left lower extremity is wrapped in Esmarch, knee flexed and the tourniquet inflated to 300 mmHg.       A midline incision is made with a ten blade through the subcutaneous tissue to the level of the extensor mechanism. A fresh blade is used to make a medial parapatellar arthrotomy. Soft tissue over the proximal medial tibia is subperiosteally elevated to the joint line with a knife and into the semimembranosus bursa with  a Cobb elevator. Soft tissue over the proximal lateral tibia is elevated with attention being paid to avoiding the patellar tendon on the tibial tubercle. The patella is everted, knee flexed 90 degrees and the ACL and PCL are removed. Findings are bone on bone medial with large global osteophytes.        The drill is used to create a starting hole in the distal femur and the canal is thoroughly irrigated with sterile saline to remove the fatty contents. The 5 degree Left  valgus alignment guide is placed into the femoral canal and the distal femoral cutting block is pinned to remove 9 mm off the distal femur. Resection is made with an oscillating saw.      The tibia is subluxed forward and the menisci are removed. The extramedullary alignment guide is placed referencing proximally at the medial aspect of the tibial tubercle and distally along the second metatarsal axis and tibial crest. The block is pinned to remove 66mm off the more deficient medial  side. Resection is made with an oscillating saw. Size 7is the most appropriate size for the tibia and the proximal tibia is prepared with the modular drill and keel punch for that size.      The femoral sizing guide is placed and size 7 is most appropriate. Rotation is marked off the epicondylar axis and confirmed by creating a rectangular flexion gap at 90 degrees. The size 7 cutting block is pinned in this rotation and the anterior, posterior and chamfer cuts are made with the oscillating saw. The intercondylar block is then placed and that cut is made.  Trial size 7 tibial component, trial size 7 posterior stabilized femur and a 7  mm posterior stabilized rotating platform insert trial is placed. Full extension is achieved with excellent varus/valgus and anterior/posterior balance throughout full range of motion. The patella is everted and thickness measured to be 27  mm. Free hand resection is taken to 15 mm, a 41 template is placed, lug holes are drilled,  trial patella is placed, and it tracks normally. Osteophytes are removed off the posterior femur with the trial in place. All trials are removed and the cut bone surfaces prepared with pulsatile lavage. Cement is mixed and once ready for implantation, the size 7 tibial implant, size  7 posterior stabilized femoral component, and the size 41 patella are cemented in place and the patella is held with the clamp. The trial insert is placed and the knee held in full extension. The Exparel (20 ml mixed with 60 ml saline) is injected into the extensor mechanism, posterior capsule, medial and lateral gutters and subcutaneous tissues.  All extruded cement is removed and once the cement is hard the permanent 7 mm posterior stabilized rotating platform insert is placed into the tibial tray.      The wound is copiously irrigated with saline solution and the extensor mechanism closed over a hemovac drain with #1 V-loc suture. The tourniquet is released for a total tourniquet time of 40  minutes. Flexion against gravity is 140 degrees and the patella tracks normally. Subcutaneous tissue is closed with 2.0 vicryl and subcuticular with running 4.0 Monocryl. The incision is cleaned and dried and steri-strips and a bulky sterile dressing are applied. The limb is placed into a knee immobilizer and the patient is awakened and transported to recovery in stable condition.      Please note that a surgical assistant was a medical necessity for this procedure in order to perform it in a safe and expeditious manner. Surgical assistant was necessary to retract the ligaments and vital neurovascular structures to prevent injury to them and also necessary for proper positioning of the limb to allow for anatomic placement of the prosthesis.   Dione Plover Curlee Bogan, MD    09/04/2017, 10:41 AM

## 2017-09-04 NOTE — Anesthesia Preprocedure Evaluation (Signed)
Anesthesia Evaluation  Patient identified by MRN, date of birth, ID band Patient awake    Reviewed: Allergy & Precautions, NPO status , Patient's Chart, lab work & pertinent test results, reviewed documented beta blocker date and time   Airway Mallampati: II  TM Distance: >3 FB Neck ROM: Full    Dental  (+) Teeth Intact, Dental Advisory Given   Pulmonary neg pulmonary ROS, former smoker,    breath sounds clear to auscultation       Cardiovascular hypertension, Pt. on home beta blockers and Pt. on medications + Peripheral Vascular Disease   Rhythm:Regular Rate:Normal     Neuro/Psych  Headaches,  Neuromuscular disease negative psych ROS   GI/Hepatic GERD  Medicated,(+) Cirrhosis       , Hepatitis -, C  Endo/Other  diabetes  Renal/GU CRFRenal disease  negative genitourinary   Musculoskeletal  (+) Arthritis , Osteoarthritis,    Abdominal   Peds negative pediatric ROS (+)  Hematology negative hematology ROS (+)   Anesthesia Other Findings HTN; Good LV fxn No AI DM Atrial Tach; on BBlocker  Reproductive/Obstetrics negative OB ROS                             Lab Results  Component Value Date   WBC 8.6 08/23/2017   HGB 14.9 08/23/2017   HCT 43.0 08/23/2017   MCV 90.9 08/23/2017   PLT 205 08/23/2017   Lab Results  Component Value Date   CREATININE 1.09 08/23/2017   BUN 24 (H) 08/23/2017   NA 135 08/23/2017   K 3.7 08/23/2017   CL 98 (L) 08/23/2017   CO2 27 08/23/2017   Lab Results  Component Value Date   INR 1.06 08/23/2017   INR 1.00 08/04/2016   INR 1.0 07/31/2008   08/2016 EKG: normal sinus rhythm, RBBB.  Anesthesia Physical  Anesthesia Plan  ASA: II  Anesthesia Plan: Spinal   Post-op Pain Management:  Regional for Post-op pain   Induction: Intravenous  PONV Risk Score and Plan: 2 and Dexamethasone, Ondansetron and Treatment may vary due to age or medical  condition  Airway Management Planned: Natural Airway  Additional Equipment:   Intra-op Plan:   Post-operative Plan:   Informed Consent: I have reviewed the patients History and Physical, chart, labs and discussed the procedure including the risks, benefits and alternatives for the proposed anesthesia with the patient or authorized representative who has indicated his/her understanding and acceptance.     Plan Discussed with: CRNA  Anesthesia Plan Comments:         Anesthesia Quick Evaluation

## 2017-09-04 NOTE — Progress Notes (Signed)
Assisted Dr. Lyndle Herrlich with left, ultrasound guided, adductor canal block. Side rails up, monitors on throughout procedure. See vital signs in flow sheet. Tolerated Procedure well. Will continue to monitor and tx pt according to MD orders. Pt resting calmly and has no s/s of distress.

## 2017-09-04 NOTE — Anesthesia Procedure Notes (Signed)
Anesthesia Regional Block: Adductor canal block   Pre-Anesthetic Checklist: ,, timeout performed, Correct Patient, Correct Site, Correct Laterality, Correct Procedure, Correct Position, site marked, Risks and benefits discussed, pre-op evaluation,  At surgeon's request and post-op pain management  Laterality: Right  Prep: chloraprep       Needles:   Needle Type: Echogenic Needle     Needle Length: 9cm  Needle Gauge: 21     Additional Needles:   Narrative:  Start time: 09/04/2017 8:53 AM End time: 09/04/2017 8:59 AM Injection made incrementally with aspirations every 5 mL. Anesthesiologist: Lyndle Herrlich, MD

## 2017-09-04 NOTE — Interval H&P Note (Signed)
History and Physical Interval Note:  09/04/2017 7:02 AM  [name]  has presented today for surgery, with the diagnosis of Osteoarthritis Left Knee  The various methods of treatment have been discussed with the patient and family. After consideration of risks, benefits and other options for treatment, the patient has consented to  Procedure(s): LEFT TOTAL KNEE ARTHROPLASTY (Left) as a surgical intervention .  The patient's history has been reviewed, patient examined, no change in status, stable for surgery.  I have reviewed the patient's chart and labs.  Questions were answered to the patient's satisfaction.     Pilar Plate Kimberley Speece

## 2017-09-05 LAB — CBC
HCT: 39.9 % (ref 39.0–52.0)
Hemoglobin: 13.4 g/dL (ref 13.0–17.0)
MCH: 31.3 pg (ref 26.0–34.0)
MCHC: 33.6 g/dL (ref 30.0–36.0)
MCV: 93.2 fL (ref 78.0–100.0)
Platelets: 212 10*3/uL (ref 150–400)
RBC: 4.28 MIL/uL (ref 4.22–5.81)
RDW: 12.8 % (ref 11.5–15.5)
WBC: 19.7 10*3/uL — ABNORMAL HIGH (ref 4.0–10.5)

## 2017-09-05 LAB — BASIC METABOLIC PANEL
Anion gap: 8 (ref 5–15)
BUN: 21 mg/dL — ABNORMAL HIGH (ref 6–20)
CO2: 28 mmol/L (ref 22–32)
Calcium: 9 mg/dL (ref 8.9–10.3)
Chloride: 102 mmol/L (ref 101–111)
Creatinine, Ser: 1.13 mg/dL (ref 0.61–1.24)
GFR calc Af Amer: >60 mL/min (ref >60)
GFR calc non Af Amer: >60 mL/min (ref >60)
Glucose, Bld: 137 mg/dL — ABNORMAL HIGH (ref 65–99)
Potassium: 4.2 mmol/L (ref 3.5–5.1)
Sodium: 138 mmol/L (ref 135–145)

## 2017-09-05 MED ORDER — RIVAROXABAN 10 MG PO TABS: 10.0000 mg | ORAL_TABLET | Freq: Every day | ORAL | 0 refills | Status: DC

## 2017-09-05 MED ORDER — HYDROMORPHONE HCL 2 MG PO TABS: 2.0000 mg | ORAL_TABLET | ORAL | 0 refills | Status: DC | PRN

## 2017-09-05 MED ORDER — DIAZEPAM 5 MG PO TABS: 5.0000 mg | ORAL_TABLET | Freq: Four times a day (QID) | ORAL | 0 refills | Status: DC | PRN

## 2017-09-05 MED ORDER — DIAZEPAM 5 MG PO TABS
5.0000 mg | ORAL_TABLET | Freq: Four times a day (QID) | ORAL | Status: DC | PRN
  Administered 2017-09-05 – 2017-09-06 (×4): 5 mg via ORAL
  Filled 2017-09-05 (×4): qty 1

## 2017-09-05 NOTE — Progress Notes (Signed)
Physical Therapy Treatment Patient Details Name: Timothy Dickson MRN: [id number] DOB: May 24, 1946 Today's Date: 09/05/2017    History of Present Illness Patient is a 72 y/o male admitted for L TKA.  PMH positive for Hep C, HTN, R TKA and irregular heartbeat.    PT Comments    POD # 1 am session Assisted OOB to amb in hallway, practiced stairs with pt and spouse then returned to room to perfrom some TKR TE's following HEP handout.  Instructed on proper tech, freq as well as use of ICE.    Follow Up Recommendations  DC plan and follow up therapy as arranged by surgeon;Supervision for mobility/OOB     Equipment Recommendations  None recommended by PT    Recommendations for Other Services       Precautions / Restrictions Precautions Precautions: Fall;Knee Restrictions Weight Bearing Restrictions: No LLE Weight Bearing: Weight bearing as tolerated    Mobility  Bed Mobility Overal bed mobility: Needs Assistance Bed Mobility: Supine to Sit     Supine to sit: Min guard;HOB elevated     General bed mobility comments: increased time  Transfers Overall transfer level: Needs assistance Equipment used: Rolling walker (2 wheeled) Transfers: Sit to/from Stand Sit to Stand: Min assist         General transfer comment: 25% VC's on safety with turns and to extend L LE prior to sit  Ambulation/Gait Ambulation/Gait assistance: Min guard Ambulation Distance (Feet): 25 Feet Assistive device: Rolling walker (2 wheeled) Gait Pattern/deviations: Step-through pattern;Step-to pattern;Decreased step length - right;Antalgic Gait velocity: decreased   General Gait Details: cues for sequence, walker safety esp with turns   Stairs Stairs: Yes   Stair Management: No rails;Step to pattern;Backwards;With walker Number of Stairs: 2 General stair comments: with spouse present for "hands on" instruction on proper tech and safety  Wheelchair Mobility    Modified Rankin (Stroke Patients  Only)       Balance                                            Cognition Arousal/Alertness: Awake/alert Behavior During Therapy: WFL for tasks assessed/performed Overall Cognitive Status: Within Functional Limits for tasks assessed                                        Exercises   Total Knee Replacement TE's 10 reps B LE ankle pumps 10 reps towel squeezes 10 reps knee presses 10 reps heel slides  10 reps SLR's 10 reps ABD Followed by ICE     General Comments        Pertinent Vitals/Pain Pain Assessment: 0-10 Pain Score: 6  Pain Location: Lknee Pain Descriptors / Indicators: Sore;Operative site guarding;Tender Pain Intervention(s): Monitored during session;Premedicated before session;Repositioned;Ice applied    Home Living                      Prior Function            PT Goals (current goals can now be found in the care plan section) Progress towards PT goals: Progressing toward goals    Frequency    7X/week      PT Plan Current plan remains appropriate    Co-evaluation  AM-PAC PT "6 Clicks" Daily Activity  Outcome Measure  Difficulty turning over in bed (including adjusting bedclothes, sheets and blankets)?: A Little Difficulty moving from lying on back to sitting on the side of the bed? : A Lot Difficulty sitting down on and standing up from a chair with arms (e.g., wheelchair, bedside commode, etc,.)?: Unable Help needed moving to and from a bed to chair (including a wheelchair)?: A Little Help needed walking in hospital room?: A Little Help needed climbing 3-5 steps with a railing? : Total 6 Click Score: 13    End of Session Equipment Utilized During Treatment: Gait belt Activity Tolerance: Patient tolerated treatment well Patient left: in chair;with call bell/phone within reach;with family/visitor present Nurse Communication: Mobility status PT Visit Diagnosis: Difficulty in  walking, not elsewhere classified (R26.2);Pain Pain - Right/Left: Left Pain - part of body: Knee     Time: 1000-1028 PT Time Calculation (min) (ACUTE ONLY): 28 min  Charges:  $Gait Training: 8-22 mins $Therapeutic Exercise: 8-22 mins                    G Codes:       Rica Koyanagi  PTA [facility]  Acute  Rehab Pager      [phone]

## 2017-09-05 NOTE — Discharge Summary (Signed)
Physician Discharge Summary   Patient ID: Timothy Dickson MRN: [id number] DOB/AGE: Jun 09, 1946 72 y.o.  Admit date: 09/04/2017 Discharge date: 09-06-2017  Primary Diagnosis:  Osteoarthritis Left knee(s)   Admission Diagnoses:  Past Medical History:  Diagnosis Date  . Atrial tachycardia ([place])    documented by holter 2016. Followed by Dr. Liane Comber and Dr. Rayann Heman.  . 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 no further tx other than surgery [facility].  [name] Cirrhosis ([place])    [facility] dx, early stage- follow up visits at [facility]  [name] CRI (chronic renal insufficiency)    s/p R nephrectomy in 1999 for malignancy  . Diabetes mellitus without complication ([facility])    NIDDM- dx. 3-4 years ago diet control  . Diverticulosis of colon    mild  . GERD (gastroesophageal reflux disease)   . Headache(784.0)   . Hepatitis C    s/p therapy [facility] (curative)/with Harv[facility]  . History of BPH    no recent issues  . History of substance abuse    past history -none in 38 yrs  . Hyperglycemia   . Hypertension   . Osteoarthritis, knee    knees bilaterally   . Pneumonia 1975   hx of 35 years ago   Discharge Diagnoses:   Active Problems:   OA (osteoarthritis) of knee  Estimated body mass index is 28.98 kg/m as calculated from the following:   Height as of this encounter: _0  (1.803 m).   Weight as of this encounter: 94.3 kg (207 lb 12.8 oz).  Procedure:  Procedure(s) (LRB): LEFT TOTAL KNEE ARTHROPLASTY (Left)   Consults: None  HPI: Timothy Dickson is a 72 y.o. 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 He has constant pain, with activity and at rest and significant functional deficits with difficulties even with ADLs. He has had extensive non-op management including analgesics, injections of cortisone and viscosupplements, and home exercise program, but remains in significant pain with significant dysfunction. Radiographs show bone on bone arthritis  medial and patellofemoral. He presents now for left Total Knee Arthroplasty.     Laboratory Data: Admission on 09/04/2017  Component Date Value Ref Range Status  . ABO/RH(D) 09/04/2017 A POS   Final  . Antibody Screen 09/04/2017 NEG   Final  . Sample Expiration 09/04/2017    Final                   Value:09/07/2017 Performed at [facility], [place] [address], [place], [place] [address]   . Glucose-Capillary 09/04/2017 110* 65 - 99 mg/dL Final  . WBC 09/05/2017 19.7* 4.0 - 10.5 K/uL Final  . RBC 09/05/2017 4.28  4.22 - 5.81 MIL/uL Final  . Hemoglobin 09/05/2017 13.4  13.0 - 17.0 g/dL Final  . HCT 09/05/2017 39.9  39.0 - 52.0 % Final  . MCV 09/05/2017 93.2  78.0 - 100.0 fL Final  . MCH 09/05/2017 31.3  26.0 - 34.0 pg Final  . MCHC 09/05/2017 33.6  30.0 - 36.0 g/dL Final  . RDW 09/05/2017 12.8  11.5 - 15.5 % Final  . Platelets 09/05/2017 212  150 - 400 K/uL Final   Performed at [facility], [place] [address], [place], [place] [address]  . Sodium 09/05/2017 138  135 - 145 mmol/L Final  . Potassium 09/05/2017 4.2  3.5 - 5.1 mmol/L Final  . Chloride 09/05/2017 102  101 - 111 mmol/L Final  .  CO2 09/05/2017 28  22 - 32 mmol/L Final  . Glucose, Bld 09/05/2017 137* 65 - 99 mg/dL Final  . BUN 09/05/2017 21* 6 - 20 mg/dL Final  . Creatinine, Ser 09/05/2017 1.13  0.61 - 1.24 mg/dL Final  . Calcium 09/05/2017 9.0  8.9 - 10.3 mg/dL Final  . GFR calc non Af Amer 09/05/2017 >60  >60 mL/min Final  . GFR calc Af Amer 09/05/2017 >60  >60 mL/min Final   Comment: (NOTE) The eGFR has been calculated using the CKD EPI equation. This calculation has not been validated in all clinical situations. eGFR's persistently <60 mL/min signify possible Chronic Kidney Disease.   Georgiann Hahn gap 09/05/2017 8  5 - 15 Final   Performed at [facility], [place] [address] [place], [place] [address]  Hospital Outpatient Visit on 08/23/2017  Component Date Value Ref  Range Status  . aPTT 08/23/2017 27  24 - 36 seconds Final  . WBC 08/23/2017 8.6  4.0 - 10.5 K/uL Final  . RBC 08/23/2017 4.73  4.22 - 5.81 MIL/uL Final  . Hemoglobin 08/23/2017 14.9  13.0 - 17.0 g/dL Final  . HCT 08/23/2017 43.0  39.0 - 52.0 % Final  . MCV 08/23/2017 90.9  78.0 - 100.0 fL Final  . MCH 08/23/2017 31.5  26.0 - 34.0 pg Final  . MCHC 08/23/2017 34.7  30.0 - 36.0 g/dL Final  . RDW 08/23/2017 12.7  11.5 - 15.5 % Final  . Platelets 08/23/2017 205  150 - 400 K/uL Final  . Sodium 08/23/2017 135  135 - 145 mmol/L Final  . Potassium 08/23/2017 3.7  3.5 - 5.1 mmol/L Final  . Chloride 08/23/2017 98* 101 - 111 mmol/L Final  . CO2 08/23/2017 27  22 - 32 mmol/L Final  . Glucose, Bld 08/23/2017 118* 65 - 99 mg/dL Final  . BUN 08/23/2017 24* 6 - 20 mg/dL Final  . Creatinine, Ser 08/23/2017 1.09  0.61 - 1.24 mg/dL Final  . Calcium 08/23/2017 9.5  8.9 - 10.3 mg/dL Final  . Total Protein 08/23/2017 6.7  6.5 - 8.1 g/dL Final  . Albumin 08/23/2017 4.2  3.5 - 5.0 g/dL Final  . AST 08/23/2017 34  15 - 41 U/L Final  . ALT 08/23/2017 21  17 - 63 U/L Final  . Alkaline Phosphatase 08/23/2017 52  38 - 126 U/L Final  . Total Bilirubin 08/23/2017 0.7  0.3 - 1.2 mg/dL Final  . GFR calc non Af Amer 08/23/2017 >60  >60 mL/min Final  . GFR calc Af Amer 08/23/2017 >60  >60 mL/min Final   Comment: (NOTE) The eGFR has been calculated using the CKD EPI equation. This calculation has not been validated in all clinical situations. eGFR's persistently <60 mL/min signify possible Chronic Kidney Disease.   . Anion gap 08/23/2017 10  5 - 15 Final  . Prothrombin Time 08/23/2017 13.7  11.4 - 15.2 seconds Final  . INR 08/23/2017 1.06   Final  . MRSA, PCR 08/23/2017 NEGATIVE  NEGATIVE Final  . Staphylococcus aureus 08/23/2017 NEGATIVE  NEGATIVE Final   Comment: (NOTE) The Xpert SA Assay (FDA approved for NASAL specimens in patients 51 years of age and older), is one component of a comprehensive surveillance  program. It is not intended to diagnose infection nor to guide or monitor treatment.   . Hgb A1c MFr Bld 08/23/2017 6.7* 4.8 - 5.6 % Final   Comment: (NOTE) Pre diabetes:          5.7%-6.4% Diabetes:              >  6.4% Glycemic control for   <7.0% adults with diabetes   . Mean Plasma Glucose 08/23/2017 145.59  mg/dL Final   Performed at [facility] [address], [place], [place] [address]     X-Rays:No results found.  EKG: Orders placed or performed in visit on 08/31/17  . EKG 12-Lead     Hospital Course: ERVIE MCCARD is a 72 y.o. who was admitted to [facility]. They were brought to the operating room on 09/04/2017 and underwent Procedure(s): LEFT TOTAL KNEE ARTHROPLASTY.  Patient tolerated the procedure well and was later transferred to the recovery room and then to the orthopaedic floor for postoperative care.  They were given PO and IV analgesics for pain control following their surgery.  They were given 24 hours of postoperative antibiotics of  Anti-infectives (From admission, onward)   Start     Dose/Rate Route Frequency Ordered Stop   09/04/17 1600  ceFAZolin (ANCEF) IVPB 2g/100 mL premix     2 g 200 mL/hr over 30 Minutes Intravenous Every 6 hours 09/04/17 1216 09/04/17 2250   09/04/17 0705  ceFAZolin (ANCEF) IVPB 2g/100 mL premix     2 g 200 mL/hr over 30 Minutes Intravenous On call to O.R. 09/04/17 0881 09/04/17 0954     and started on DVT prophylaxis in the form of Xarelto.   PT and OT were ordered for total joint protocol.  Discharge planning consulted to help with postop disposition and equipment needs.  Patient had a decent night on the evening of surgery.  They started to get up OOB with therapy on day one. Hemovac drain was pulled without difficulty.  Continued to work with therapy into day two.  Dressing was changed on day two and the incision was healing well.   Patient was seen in rounds on day two and was ready to go home.  Diet - Cardiac diet,  Diabetic diet and Renal diet Follow up - in 2 weeks Activity - WBAT Disposition - Home Condition Upon Discharge - Stable D/C Meds - See DC Summary DVT Prophylaxis - Xarelto    Discharge Instructions    Call MD / Call 911   Complete by:  As directed    If you experience chest pain or shortness of breath, CALL 911 and be transported to the hospital emergency room.  If you develope a fever above 101 F, pus (white drainage) or increased drainage or redness at the wound, or calf pain, call your surgeon's office.   Change dressing   Complete by:  As directed    Change dressing daily with sterile 4 x 4 inch gauze dressing and apply TED hose. Do not submerge the incision under water.   Constipation Prevention   Complete by:  As directed    Drink plenty of fluids.  Prune juice may be helpful.  You may use a stool softener, such as Colace (over the counter) 100 mg twice a day.  Use MiraLax (over the counter) for constipation as needed.   Diet - low sodium heart healthy   Complete by:  As directed    Discharge instructions   Complete by:  As directed    Take Xarelto for two and a half more weeks, then discontinue Xarelto.  Pick up stool softner and laxative for home use following surgery while on pain medications. Do not submerge incision under water. Please use good hand washing techniques while changing dressing each day. May shower starting three days after surgery. Please  use a clean towel to pat the incision dry following showers. Continue to use ice for pain and swelling after surgery. Do not use any lotions or creams on the incision until instructed by your surgeon.  Wear both TED hose on both legs during the day every day for three weeks, but may remove the TED hose at night at home.  Postoperative Constipation Protocol  Constipation - defined medically as fewer than three stools per week and severe constipation as less than one stool per week.  One of the most common issues  patients have following surgery is constipation.  Even if you have a regular bowel pattern at home, your normal regimen is likely to be disrupted due to multiple reasons following surgery.  Combination of anesthesia, postoperative narcotics, change in appetite and fluid intake all can affect your bowels.  In order to avoid complications following surgery, here are some recommendations in order to help you during your recovery period.  Colace (docusate) - Pick up an over-the-counter form of Colace or another stool softener and take twice a day as long as you are requiring postoperative pain medications.  Take with a full glass of water daily.  If you experience loose stools or diarrhea, hold the colace until you stool forms back up.  If your symptoms do not get better within 1 week or if they get worse, check with your doctor.  Dulcolax (bisacodyl) - Pick up over-the-counter and take as directed by the product packaging as needed to assist with the movement of your bowels.  Take with a full glass of water.  Use this product as needed if not relieved by Colace only.   MiraLax (polyethylene glycol) - Pick up over-the-counter to have on hand.  MiraLax is a solution that will increase the amount of water in your bowels to assist with bowel movements.  Take as directed and can mix with a glass of water, juice, soda, coffee, or tea.  Take if you go more than two days without a movement. Do not use MiraLax more than once per day. Call your doctor if you are still constipated or irregular after using this medication for 7 days in a row.  If you continue to have problems with postoperative constipation, please contact the office for further assistance and recommendations.  If you experience "the worst abdominal pain ever" or develop nausea or vomiting, please contact the office immediatly for further recommendations for treatment.   Do not put a pillow under the knee. Place it under the heel.   Complete by:  As  directed    Do not sit on low chairs, stoools or toilet seats, as it may be difficult to get up from low surfaces   Complete by:  As directed    Driving restrictions   Complete by:  As directed    No driving until released by the physician.   Increase activity slowly as tolerated   Complete by:  As directed    Lifting restrictions   Complete by:  As directed    No lifting until released by the physician.   Patient may shower   Complete by:  As directed    You may shower without a dressing once there is no drainage.  Do not wash over the wound.  If drainage remains, do not shower until drainage stops.   TED hose   Complete by:  As directed    Use stockings (TED hose) for 3 weeks on both leg(s).  You  may remove them at night for sleeping.   Weight bearing as tolerated   Complete by:  As directed    Laterality:  left   Extremity:  Lower     Allergies as of 09/05/2017      Reactions   Statins    Due to cirrhosis    Acetaminophen Other (See Comments)   Pt states he was told by [facility] MD's to keep tylenol dosage to 2000 mg per 24 hours due to cirrhosis of liver.   Aspirin Other (See Comments)   Pt has only 1 kidney - been told to stay away from Aspirin    Ibuprofen       Medication List    TAKE these medications   carvedilol 12.5 MG tablet Commonly known as:  COREG Take 1 tablet (12.5 mg total) by mouth 2 (two) times daily with a meal.   diazepam 5 MG tablet Commonly known as:  VALIUM Take 1 tablet (5 mg total) by mouth every 6 (six) hours as needed for muscle spasms.   ezetimibe 10 MG tablet Commonly known as:  ZETIA Take 1 tablet (10 mg total) by mouth daily.   flecainide 50 MG tablet Commonly known as:  TAMBOCOR Take 2 tablets by mouth in the a.m. And take 1 tablet by mouth in the afternoon   gabapentin 600 MG tablet Commonly known as:  NEURONTIN Take 600 mg by mouth at bedtime.   hydrochlorothiazide 25 MG tablet Commonly known as:  HYDRODIURIL Take 1 tablet (25  mg total) by mouth daily.   HYDROmorphone 2 MG tablet Commonly known as:  DILAUDID Take 1-2 tablets (2-4 mg total) by mouth every 4 (four) hours as needed for moderate pain.   omeprazole 20 MG capsule Commonly known as:  PRILOSEC Take 20 mg by mouth 2 (two) times a week.   rivaroxaban 10 MG Tabs tablet Commonly known as:  XARELTO Take 1 tablet (10 mg total) by mouth daily with breakfast. Take Xarelto for two and a half more weeks following discharge from the hospital, then discontinue Xarelto. Once the patient has completed the blood thinner regimen, then take a Baby 81 mg Aspirin daily for three more weeks. Start taking on:  09/06/2017            Discharge Care Instructions  (From admission, onward)        Start     Ordered   09/05/17 0000  Weight bearing as tolerated    Question Answer Comment  Laterality left   Extremity Lower      09/05/17 2055   09/05/17 0000  Change dressing    Comments:  Change dressing daily with sterile 4 x 4 inch gauze dressing and apply TED hose. Do not submerge the incision under water.   09/05/17 2055     Follow-up Information    Gaynelle Arabian, MD. Schedule an appointment as soon as possible for a visit on 09/19/2017.   Specialty:  Orthopedic Surgery Contact information: [address] [place] [address] [phone]           Signed: Arlee Muslim, PA-C Orthopaedic Surgery 09/05/2017, 8:56 PM

## 2017-09-05 NOTE — Plan of Care (Signed)
Plan of care reviewed and discussed with patient.   

## 2017-09-05 NOTE — Progress Notes (Signed)
Occupational Therapy Evaluation Patient Details Name: Timothy Dickson MRN: [id number] DOB: Nov 02, 1945 Today's Date: 09/05/2017    History of Present Illness Patient is a 72 y/o male admitted for L TKA.  PMH positive for Hep C, HTN, R TKA and irregular heartbeat.   Clinical Impression   Pt was admitted for the above sx.  He had R TKA last year. Reviewed education and educated on shower stall transfer as he has renovated bathroom since last sx.  No further OT is needed at this time    Follow Up Recommendations  Supervision/Assistance - 24 hour    Equipment Recommendations  None recommended by OT    Recommendations for Other Services       Precautions / Restrictions Precautions Precautions: Fall;Knee Restrictions Weight Bearing Restrictions: Yes LLE Weight Bearing: Weight bearing as tolerated      Mobility Bed Mobility               General bed mobility comments: not performed  Transfers                 General transfer comment: not performed; min A with PT yesterday    Balance                                           ADL either performed or assessed with clinical judgement   ADL Overall ADL's : Needs assistance/impaired                                       General ADL Comments: Pt had R done done last year. His wife recently had sx for trigger finger but is out of bandage now.  He has renovated bathroom since last sx.  He now has a walk in shower with built in bench and hand held shower.  Demonstrated shower transfer and gave him a handout. Educated that KI can be used he feels he needs extra support for this transfer or if he cannot do 10 SLRs.  Also educated on bringing walker in, once both feet are in, to safely access seat. He does not have any grab bars in the shower.   He did not feel he needed to practice.  He can perform UB adls with set up and min A for LB bathing, mod/max for LB dressing. His concern is wife  helping with ted hose.  Explained using thick plastic bag or turning inside out and pulling toes through to heel to don over foot.  Reviewed general walker safety.  Pt verbalizes understanding of all education     Vision         Perception     Praxis      Pertinent Vitals/Pain Pain Score: 2  Pain Location: Lknee Pain Descriptors / Indicators: Sore Pain Intervention(s): Limited activity within patient's tolerance;Monitored during session;Premedicated before session;Ice applied     Hand Dominance     Extremity/Trunk Assessment Upper Extremity Assessment Upper Extremity Assessment: Overall WFL for tasks assessed           Communication Communication Communication: No difficulties   Cognition Arousal/Alertness: Awake/alert Behavior During Therapy: WFL for tasks assessed/performed Overall Cognitive Status: Within Functional Limits for tasks assessed  General Comments       Exercises     Shoulder Instructions      Home Living Family/patient expects to be discharged to:: Private residence Living Arrangements: Spouse/significant other Available Help at Discharge: Family               Bathroom Shower/Tub: Walk-in shower   Bathroom Toilet: Handicapped height     Home Equipment: Environmental consultant - 2 wheels;Cane - single point;Bedside commode;Shower seat - built in          Prior Functioning/Environment Level of Independence: Independent                 OT Problem List:        OT Treatment/Interventions:      OT Goals(Current goals can be found in the care plan section) Acute Rehab OT Goals Patient Stated Goal: To go home OT Goal Formulation: All assessment and education complete, DC therapy  OT Frequency:     Barriers to D/C:            Co-evaluation              AM-PAC PT "6 Clicks" Daily Activity     Outcome Measure Help from another person eating meals?: None Help from another person  taking care of personal grooming?: A Little Help from another person toileting, which includes using toliet, bedpan, or urinal?: A Little Help from another person bathing (including washing, rinsing, drying)?: A Little Help from another person to put on and taking off regular upper body clothing?: A Little Help from another person to put on and taking off regular lower body clothing?: A Lot 6 Click Score: 18   End of Session CPM Left Knee CPM Left Knee: Off  Activity Tolerance:   Patient left: in chair;with call bell/phone within reach  OT Visit Diagnosis: Pain Pain - Right/Left: Left Pain - part of body: Knee                Time: 1115-1130 OT Time Calculation (min): 15 min Charges:  OT General Charges $OT Visit: 1 Visit OT Evaluation $OT Eval Low Complexity: 1 Low G-Codes:     [place], OTR/L [phone] 09/05/2017  Teneshia Hedeen 09/05/2017, 11:48 AM

## 2017-09-05 NOTE — Progress Notes (Signed)
   Subjective: 1 Day Post-Op Procedure(s) (LRB): LEFT TOTAL KNEE ARTHROPLASTY (Left) Patient reports pain as mild and moderate.   Patient seen in rounds for Dr. Wynelle Link.  Rough night with spasms but better this morning. Patient is well, but has had some minor complaints of pain in the knee, requiring pain medications Started therapy today.  Plan is to go Home after hospital stay.  Objective: Vital signs in last 24 hours: Temp:  [97.7 F (36.5 C)-98.9 F (37.2 C)] 98.9 F (37.2 C) (02/05 1321) Pulse Rate:  [62-73] 71 (02/05 1321) Resp:  [16-17] 17 (02/05 1321) BP: (112-124)/(66-84) 124/84 (02/05 1321) SpO2:  [95 %-97 %] 95 % (02/05 1321)  Intake/Output from previous day:  Intake/Output Summary (Last 24 hours) at 09/05/2017 2049 Last data filed at 09/05/2017 1905 Gross per 24 hour  Intake 2840.33 ml  Output 1965 ml  Net 875.33 ml    Intake/Output this shift: Total I/O In: -  Out: 375 [Urine:375]  Labs: Recent Labs    09/05/17 0630  HGB 13.4   Recent Labs    09/05/17 0630  WBC 19.7*  RBC 4.28  HCT 39.9  PLT 212   Recent Labs    09/05/17 0630  NA 138  K 4.2  CL 102  CO2 28  BUN 21*  CREATININE 1.13  GLUCOSE 137*  CALCIUM 9.0   No results for input(s): LABPT, INR in the last 72 hours.  EXAM General - Patient is Alert, Appropriate and Oriented Extremity - Neurovascular intact Sensation intact distally Intact pulses distally Dorsiflexion/Plantar flexion intact Dressing - dressing C/D/I Motor Function - intact, moving foot and toes well on exam.  Hemovac pulled without difficulty.  Past Medical History:  Diagnosis Date  . Atrial tachycardia ([place])    documented by holter 2016. Followed by Dr. Liane Comber and Dr. Rayann Heman.  . 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 no further tx other than surgery [facility].  [name] Cirrhosis ([place])    [facility] dx, early stage- follow up visits at [facility]  [name] CRI (chronic renal insufficiency)    s/p R nephrectomy in 1999 for  malignancy  . Diabetes mellitus without complication (HCC)    NIDDM- dx. 3-4 years ago diet control  . Diverticulosis of colon    mild  . GERD (gastroesophageal reflux disease)   . Headache(784.0)   . Hepatitis C    s/p therapy [facility] (curative)/with Harvoni  . History of BPH    no recent issues  . History of substance abuse    past history -none in 38 yrs  . Hyperglycemia   . Hypertension   . Osteoarthritis, knee    knees bilaterally   . Pneumonia 1975   hx of 35 years ago    Assessment/Plan: 1 Day Post-Op Procedure(s) (LRB): LEFT TOTAL KNEE ARTHROPLASTY (Left) Active Problems:   OA (osteoarthritis) of knee  Estimated body mass index is 28.98 kg/m as calculated from the following:   Height as of this encounter: 5\' 11"  (1.803 m).   Weight as of this encounter: 94.3 kg (207 lb 12.8 oz). Advance diet Up with therapy Plan for discharge tomorrow  Changed to Valium Straight to Outpatient Therapy  DVT Prophylaxis - Xarelto Weight-Bearing as tolerated to left leg D/C O2 and Pulse OX and try on Room Air  Arlee Muslim, PA-C Orthopaedic Surgery 09/05/2017, 8:49 PM

## 2017-09-05 NOTE — Progress Notes (Signed)
Physical Therapy Treatment Patient Details Name: Timothy Dickson MRN: [id number] DOB: 05-18-1946 Today's Date: 09/05/2017    History of Present Illness Patient is a 72 y/o male admitted for L TKA.  PMH positive for Hep C, HTN, R TKA and irregular heartbeat.    PT Comments    POD # 1 pm session Assisted to bathroom for attempted void (unable).  Assisted with amb in hallway an increased distance.  Pt plans to D/C to home tomorrow.   Follow Up Recommendations  DC plan and follow up therapy as arranged by surgeon;Supervision for mobility/OOB     Equipment Recommendations  None recommended by PT    Recommendations for Other Services       Precautions / Restrictions Precautions Precautions: Fall;Knee Restrictions Weight Bearing Restrictions: No LLE Weight Bearing: Weight bearing as tolerated    Mobility  Bed Mobility Overal bed mobility: Needs Assistance Bed Mobility: Supine to Sit     Supine to sit: Min guard;HOB elevated     General bed mobility comments: OOB in recliner  Transfers Overall transfer level: Needs assistance Equipment used: Rolling walker (2 wheeled) Transfers: Sit to/from Stand Sit to Stand: Min assist         General transfer comment: 25% VC's on safety with turns and to extend L LE prior to sit  Ambulation/Gait Ambulation/Gait assistance: Min guard Ambulation Distance (Feet): 85 Feet Assistive device: Rolling walker (2 wheeled) Gait Pattern/deviations: Step-through pattern;Step-to pattern;Decreased step length - right;Antalgic Gait velocity: decreased   General Gait Details: cues for sequence, walker safety esp with turns  tolerated an increased distance   Stairs Stairs: Yes   Stair Management: No rails;Step to pattern;Backwards;With walker Number of Stairs: 2 General stair comments: with spouse present for "hands on" instruction on proper tech and safety  Wheelchair Mobility    Modified Rankin (Stroke Patients Only)        Balance                                            Cognition Arousal/Alertness: Awake/alert Behavior During Therapy: WFL for tasks assessed/performed Overall Cognitive Status: Within Functional Limits for tasks assessed                                        Exercises      General Comments        Pertinent Vitals/Pain Pain Assessment: 0-10 Pain Score: 6  Pain Location: Lknee Pain Descriptors / Indicators: Sore;Operative site guarding;Tender Pain Intervention(s): Monitored during session;Premedicated before session;Repositioned;Ice applied    Home Living                      Prior Function            PT Goals (current goals can now be found in the care plan section) Progress towards PT goals: Progressing toward goals    Frequency    7X/week      PT Plan Current plan remains appropriate    Co-evaluation              AM-PAC PT "6 Clicks" Daily Activity  Outcome Measure  Difficulty turning over in bed (including adjusting bedclothes, sheets and blankets)?: A Little Difficulty moving from lying on back to sitting on the side of the  bed? : A Lot Difficulty sitting down on and standing up from a chair with arms (e.g., wheelchair, bedside commode, etc,.)?: Unable Help needed moving to and from a bed to chair (including a wheelchair)?: A Little Help needed walking in hospital room?: A Little Help needed climbing 3-5 steps with a railing? : Total 6 Click Score: 13    End of Session Equipment Utilized During Treatment: Gait belt Activity Tolerance: Patient tolerated treatment well Patient left: in chair;with call bell/phone within reach;with family/visitor present Nurse Communication: Mobility status PT Visit Diagnosis: Difficulty in walking, not elsewhere classified (R26.2);Pain Pain - Right/Left: Left Pain - part of body: Knee     Time: 7737-3668 PT Time Calculation (min) (ACUTE ONLY): 34 min  Charges:   $Gait Training: 8-22 mins $Therapeutic Activity: 8-22 mins                    G Codes:       Rica Koyanagi  PTA [facility]  Acute  Rehab Pager      [phone]

## 2017-09-06 LAB — CBC
HCT: 36.5 % — ABNORMAL LOW (ref 39.0–52.0)
Hemoglobin: 12.6 g/dL — ABNORMAL LOW (ref 13.0–17.0)
MCH: 31.4 pg (ref 26.0–34.0)
MCHC: 34.5 g/dL (ref 30.0–36.0)
MCV: 91 fL (ref 78.0–100.0)
Platelets: 203 10*3/uL (ref 150–400)
RBC: 4.01 MIL/uL — ABNORMAL LOW (ref 4.22–5.81)
RDW: 12.7 % (ref 11.5–15.5)
WBC: 16.2 10*3/uL — ABNORMAL HIGH (ref 4.0–10.5)

## 2017-09-06 LAB — BASIC METABOLIC PANEL
Anion gap: 8 (ref 5–15)
BUN: 25 mg/dL — ABNORMAL HIGH (ref 6–20)
CO2: 30 mmol/L (ref 22–32)
Calcium: 9.2 mg/dL (ref 8.9–10.3)
Chloride: 97 mmol/L — ABNORMAL LOW (ref 101–111)
Creatinine, Ser: 1.12 mg/dL (ref 0.61–1.24)
GFR calc Af Amer: >60 mL/min (ref >60)
GFR calc non Af Amer: >60 mL/min (ref >60)
Glucose, Bld: 200 mg/dL — ABNORMAL HIGH (ref 65–99)
Potassium: 4 mmol/L (ref 3.5–5.1)
Sodium: 135 mmol/L (ref 135–145)

## 2017-09-06 NOTE — Plan of Care (Signed)
Plan of care reviewed. 

## 2017-09-06 NOTE — Progress Notes (Signed)
Physical Therapy Treatment Patient Details Name: Timothy Dickson MRN: [id number] DOB: 09-Sep-1945 Today's Date: 09/06/2017    History of Present Illness Patient is a 72 y/o male admitted for L TKA.  PMH positive for Hep C, HTN, R TKA and irregular heartbeat.    PT Comments    POD # 2 pm session Spouse present and "hands on" assisted with all mobility esp stairs.  Up backward due to no rails.  All questions addressed.  Pt ready for D/C to home.   Follow Up Recommendations        Equipment Recommendations       Recommendations for Other Services       Precautions / Restrictions Precautions Precautions: Fall;Knee Restrictions Weight Bearing Restrictions: No LLE Weight Bearing: Weight bearing as tolerated    Mobility  Bed Mobility Overal bed mobility: Needs Assistance Bed Mobility: Supine to Sit     Supine to sit: Supervision;Min guard     General bed mobility comments: assist L LE off bed  Transfers Overall transfer level: Needs assistance Equipment used: Rolling walker (2 wheeled) Transfers: Sit to/from Stand Sit to Stand: Supervision;Min guard         General transfer comment: 25% VC's on safety with turns and to extend L LE prior to sit  Ambulation/Gait Ambulation/Gait assistance: Supervision;Min guard Ambulation Distance (Feet): 125 Feet Assistive device: Rolling walker (2 wheeled) Gait Pattern/deviations: Step-through pattern;Step-to pattern;Decreased step length - right;Antalgic Gait velocity: decreased   General Gait Details: cues for sequence, walker safety esp with turns  tolerated an increased distance   Stairs Stairs: Yes   Stair Management: No rails;Step to pattern;Backwards;With walker Number of Stairs: 2 General stair comments: with spouse present for "hands on" instruction on proper tech and safety  Wheelchair Mobility    Modified Rankin (Stroke Patients Only)       Balance                                            Cognition Arousal/Alertness: Awake/alert Behavior During Therapy: WFL for tasks assessed/performed Overall Cognitive Status: Within Functional Limits for tasks assessed                                        Exercises      General Comments        Pertinent Vitals/Pain Pain Assessment: 0-10 Pain Score: 5  Pain Location: Lknee Pain Descriptors / Indicators: Sore;Operative site guarding;Tender Pain Intervention(s): Monitored during session;Premedicated before session;Repositioned;Ice applied    Home Living                      Prior Function            PT Goals (current goals can now be found in the care plan section) Progress towards PT goals: Progressing toward goals    Frequency           PT Plan      Co-evaluation              AM-PAC PT "6 Clicks" Daily Activity  Outcome Measure  Difficulty turning over in bed (including adjusting bedclothes, sheets and blankets)?: A Little Difficulty moving from lying on back to sitting on the side of the bed? : A Lot Difficulty sitting down on  and standing up from a chair with arms (e.g., wheelchair, bedside commode, etc,.)?: Unable Help needed moving to and from a bed to chair (including a wheelchair)?: A Little Help needed walking in hospital room?: A Little Help needed climbing 3-5 steps with a railing? : Total 6 Click Score: 13    End of Session Equipment Utilized During Treatment: Gait belt Activity Tolerance: Patient tolerated treatment well Patient left: in chair;with call bell/phone within reach;with family/visitor present Nurse Communication: Mobility status       Time: 1735-6701 PT Time Calculation (min) (ACUTE ONLY): 16 min  Charges:  $Gait Training: 8-22 mins $Therapeutic Exercise: 8-22 mins                    G Codes:       Rica Koyanagi  PTA [facility]  Acute  Rehab Pager      [phone]

## 2017-09-06 NOTE — Progress Notes (Signed)
Physical Therapy Treatment Patient Details Name: Timothy Dickson MRN: [id number] DOB: 1945-10-12 Today's Date: 09/06/2017    History of Present Illness Patient is a 72 y/o male admitted for L TKA.  PMH positive for Hep C, HTN, R TKA and irregular heartbeat.    PT Comments    POD # 2 am session Performed all supine TKR Te's following HEP handout.  Instructed on proper tech, freq as well as use of ICE. Pt will need another PT session to address stairs when spouse arrives.   Follow Up Recommendations        Equipment Recommendations       Recommendations for Other Services       Precautions / Restrictions Precautions Precautions: Fall;Knee Restrictions Weight Bearing Restrictions: No LLE Weight Bearing: Weight bearing as tolerated          Cognition Arousal/Alertness: Awake/alert Behavior During Therapy: WFL for tasks assessed/performed Overall Cognitive Status: Within Functional Limits for tasks assessed                                        Exercises   Total Knee Replacement TE's 10 reps B LE ankle pumps 10 reps towel squeezes 10 reps knee presses 10 reps heel slides  10 reps SAQ's 10 reps SLR's 10 reps ABD Followed by ICE    General Comments        Pertinent Vitals/Pain Pain Assessment: 0-10 Pain Score: 5  Pain Location: Lknee Pain Descriptors / Indicators: Sore;Operative site guarding;Tender Pain Intervention(s): Monitored during session;Premedicated before session;Repositioned;Ice applied    Home Living                      Prior Function            PT Goals (current goals can now be found in the care plan section) Progress towards PT goals: Progressing toward goals     End of Session    left in bed with call light in reach           Time: 1000-1015 PT Time Calculation (min) (ACUTE ONLY): 15 min  Charges:  $Therapeutic Exercise: 8-22 mins                    G Codes:       Rica Koyanagi  PTA [facility]  Acute   Rehab Pager      [phone]

## 2017-09-06 NOTE — Progress Notes (Signed)
   Subjective: 2 Days Post-Op Procedure(s) (LRB): LEFT TOTAL KNEE ARTHROPLASTY (Left) Patient reports pain as mild.   Patient seen in rounds for Dr. Wynelle Link. Patient is well, but has had some minor complaints of pain in the knee, requiring pain medications Patient is ready to go home  Objective: Vital signs in last 24 hours: Temp:  [97.9 F (36.6 C)-98.9 F (37.2 C)] 98 F (36.7 C) (02/06 0608) Pulse Rate:  [67-73] 67 (02/06 0608) Resp:  [15-17] 15 (02/06 0608) BP: (112-135)/(66-88) 129/88 (02/06 0608) SpO2:  [95 %-98 %] 98 % (02/06 0608)  Intake/Output from previous day:  Intake/Output Summary (Last 24 hours) at 09/06/2017 0717 Last data filed at 09/06/2017 0608 Gross per 24 hour  Intake 1510 ml  Output 3200 ml  Net -1690 ml    Intake/Output this shift: No intake/output data recorded.  Labs: Recent Labs    09/05/17 0630 09/06/17 0549  HGB 13.4 12.6*   Recent Labs    09/05/17 0630 09/06/17 0549  WBC 19.7* 16.2*  RBC 4.28 4.01*  HCT 39.9 36.5*  PLT 212 203   Recent Labs    09/05/17 0630 09/06/17 0549  NA 138 135  K 4.2 4.0  CL 102 97*  CO2 28 30  BUN 21* 25*  CREATININE 1.13 1.12  GLUCOSE 137* 200*  CALCIUM 9.0 9.2   No results for input(s): LABPT, INR in the last 72 hours.  EXAM: General - Patient is Alert and Appropriate Extremity - Neurovascular intact Sensation intact distally Incision - clean, dry Motor Function - intact, moving foot and toes well on exam.   Assessment/Plan: 2 Days Post-Op Procedure(s) (LRB): LEFT TOTAL KNEE ARTHROPLASTY (Left) Procedure(s) (LRB): LEFT TOTAL KNEE ARTHROPLASTY (Left) Past Medical History:  Diagnosis Date  . Atrial tachycardia ([place])    documented by holter 2016. Followed by Dr. Liane Comber and Dr. Rayann Heman.  . 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 no further tx other than surgery [facility]  [name] Cirrhosis ([place])    [facility] dx, early stage- [facility] up visits at [facility].  [name] CRI (chronic renal insufficiency)    s/p  R nephrectomy in 1999 for malignancy  . Diabetes mellitus without complication (HCC)    NIDDM- dx. 3-4 years ago diet control  . Diverticulosis of colon    mild  . GERD (gastroesophageal reflux disease)   . Headache(784.0)   . Hepatitis C    s/p therapy [facility] (curative)/with Harvoni  . History of BPH    no recent issues  . History of substance abuse    past history -none in 38 yrs  . Hyperglycemia   . Hypertension   . Osteoarthritis, knee    knees bilaterally   . Pneumonia 1975   hx of 35 years ago   Active Problems:   OA (osteoarthritis) of knee  Estimated body mass index is 28.98 kg/m as calculated from the following:   Height as of this encounter: 5\' 11"  (1.803 m).   Weight as of this encounter: 94.3 kg (207 lb 12.8 oz). Up with therapy Diet - Cardiac diet, Diabetic diet and Renal diet Follow up - in 2 weeks Activity - WBAT Disposition - Home Condition Upon Discharge - Stable D/C Meds - See DC Summary DVT Prophylaxis - Xarelto  Arlee Muslim, PA-C Orthopaedic Surgery 09/06/2017, 7:17 AM

## 2017-09-12 DIAGNOSIS — M25562 Pain in left knee: Secondary | ICD-10-CM | POA: Diagnosis not present

## 2017-09-15 DIAGNOSIS — M25562 Pain in left knee: Secondary | ICD-10-CM | POA: Diagnosis not present

## 2017-09-19 DIAGNOSIS — M25562 Pain in left knee: Secondary | ICD-10-CM | POA: Diagnosis not present

## 2017-09-22 DIAGNOSIS — M25562 Pain in left knee: Secondary | ICD-10-CM | POA: Diagnosis not present

## 2017-09-24 DIAGNOSIS — Z96652 Presence of left artificial knee joint: Secondary | ICD-10-CM | POA: Insufficient documentation

## 2017-09-26 DIAGNOSIS — M25562 Pain in left knee: Secondary | ICD-10-CM | POA: Diagnosis not present

## 2017-09-29 DIAGNOSIS — M25562 Pain in left knee: Secondary | ICD-10-CM | POA: Diagnosis not present

## 2017-10-03 DIAGNOSIS — M25562 Pain in left knee: Secondary | ICD-10-CM | POA: Diagnosis not present

## 2017-10-10 DIAGNOSIS — M25562 Pain in left knee: Secondary | ICD-10-CM | POA: Diagnosis not present

## 2017-10-13 DIAGNOSIS — Z96652 Presence of left artificial knee joint: Secondary | ICD-10-CM | POA: Diagnosis not present

## 2017-10-13 DIAGNOSIS — M25562 Pain in left knee: Secondary | ICD-10-CM | POA: Diagnosis not present

## 2017-10-13 DIAGNOSIS — Z471 Aftercare following joint replacement surgery: Secondary | ICD-10-CM | POA: Diagnosis not present

## 2017-10-24 DIAGNOSIS — I10 Essential (primary) hypertension: Secondary | ICD-10-CM | POA: Diagnosis not present

## 2017-10-24 DIAGNOSIS — Z85528 Personal history of other malignant neoplasm of kidney: Secondary | ICD-10-CM | POA: Diagnosis not present

## 2017-10-24 DIAGNOSIS — Z905 Acquired absence of kidney: Secondary | ICD-10-CM | POA: Diagnosis not present

## 2017-10-24 DIAGNOSIS — M10072 Idiopathic gout, left ankle and foot: Secondary | ICD-10-CM | POA: Diagnosis not present

## 2017-10-26 ENCOUNTER — Ambulatory Visit: Payer: Medicare Other | Admitting: Internal Medicine

## 2017-10-26 ENCOUNTER — Encounter: Payer: Self-pay | Admitting: Internal Medicine

## 2017-10-26 VITALS — BP 122/62 | HR 75 | Ht 71.0 in | Wt 208.0 lb

## 2017-10-26 DIAGNOSIS — I471 Supraventricular tachycardia: Secondary | ICD-10-CM | POA: Diagnosis not present

## 2017-10-26 DIAGNOSIS — I10 Essential (primary) hypertension: Secondary | ICD-10-CM

## 2017-10-26 NOTE — Progress Notes (Signed)
PCP: Antony Contras, MD Primary Cardiologist: Dr Meda Coffee Primary EP: Dr Thompson Grayer Timothy Dickson is a 72 y.o. male who presents today for routine electrophysiology followup.  Since last being seen in our clinic, the patient reports doing very well.  He did have one episode of sustained arrhythmia about a month ago (shortly after knee surgery).  This is the only episode in the past year.  He has had bilateral knee surgeries over the past year, without issues.  Today, he denies symptoms of palpitations, chest pain, shortness of breath,  lower extremity edema, dizziness, presyncope, or syncope.  The patient is otherwise without complaint today.   Past Medical History:  Diagnosis Date  . Atrial tachycardia ([place])    documented by holter 2016. Followed by Dr. Liane Comber and Dr. Rayann Heman.  . 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 no further tx other than surgery [facility].  [name] Cirrhosis ([place])    [facility] dx, early stage- follow up visits at [facility].  [name] CRI (chronic renal insufficiency)    s/p R nephrectomy in 1999 for malignancy  . Diabetes mellitus without complication (HCC)    NIDDM- dx. 3-4 years ago diet control  . Diverticulosis of colon    mild  . GERD (gastroesophageal reflux disease)   . Headache(784.0)   . Hepatitis C    s/p therapy [facility] (curative)/with Harvoni  . History of BPH    no recent issues  . History of substance abuse    past history -none in 38 yrs  . Hyperglycemia   . Hypertension   . Osteoarthritis, knee    knees bilaterally   . Pneumonia 1975   hx of 35 years ago   Past Surgical History:  Procedure Laterality Date  . Arthroscopy right knee     may '11 (Dr Alvan Dame)  . Arthroscopy, left knee,    . history of liver biopsy  2007  . History of Nephrectomy Right 1999  . KNEE ARTHROSCOPY Left 10/10/2012   Procedure: LEFT KNEE ARTHROSCOPY WITH MENSCIAL DEBRIDEMENT AND CHONDROPLASTY;  Surgeon: Gearlean Alf, MD;  Location: [facility];  Service: Orthopedics;  Laterality: Left;  .  left shoulder repair for bone spur  1990's  . TOTAL KNEE ARTHROPLASTY Right 08/15/2016   Procedure: RIGHT TOTAL KNEE ARTHROPLASTY, CORTISONE INJECTION OF LEFT KNEE;  Surgeon: Gaynelle Arabian, MD;  Location: [facility];  Service: Orthopedics;  Laterality: Right;  . TOTAL KNEE ARTHROPLASTY Left 09/04/2017   Procedure: LEFT TOTAL KNEE ARTHROPLASTY;  Surgeon: Gaynelle Arabian, MD;  Location: [facility];  Service: Orthopedics;  Laterality: Left;  . Transurethral needle ablation procedure    . TUNA procedure      ROS- all systems are reviewed and negatives except as per HPI above  Current Outpatient Medications  Medication Sig Dispense Refill  . carvedilol (COREG) 12.5 MG tablet Take 1 tablet (12.5 mg total) by mouth 2 (two) times daily with a meal. 180 tablet 3  . ezetimibe (ZETIA) 10 MG tablet Take 1 tablet (10 mg total) by mouth daily. 90 tablet 3  . flecainide (TAMBOCOR) 50 MG tablet Take 2 tablets by mouth in the a.m. And take 1 tablet by mouth in the afternoon    . gabapentin (NEURONTIN) 600 MG tablet Take 600 mg by mouth at bedtime.    . hydrochlorothiazide (HYDRODIURIL) 25 MG tablet Take 1 tablet (25 mg total) by mouth daily. 90 tablet 3  . omeprazole (PRILOSEC) 20 MG capsule Take 20 mg by mouth 2 (two)  times a week.     No current facility-administered medications for this visit.     Physical Exam: Vitals:   10/26/17 0937  BP: 122/62  Pulse: 75  Weight: 94.3 kg (208 lb)  Height: 5\' 11"  (1.803 m)    GEN- The patient is well appearing, alert and oriented x 3 today.   Head- normocephalic, atraumatic Eyes-  Sclera clear, conjunctiva pink Ears- hearing intact Oropharynx- clear Lungs- Clear to ausculation bilaterally, normal work of breathing Heart- Regular rate and rhythm, no murmurs, rubs or gallops, PMI not laterally displaced GI- soft, NT, ND, + BS Extremities- no clubbing, cyanosis, or edema  EKG tracing ordered today is personally reviewed and shows sinus rhythm 75bpm, PR 152 msec, QRS  94 msec, Qtc 437 msec  Assessment and Plan:  1. SVT Well controlled with flecainide and coreg May be a reentrant arrhythmia however he has also had nonsustained atach Continue current therapy A conservative approach is planned.  2. HTN Stable No change required today  Return to see EP PA in a year Follow-up with Dr Meda Coffee as scheduled  Thompson Grayer MD, [facility] 10/26/2017 9:48 AM

## 2017-10-26 NOTE — Patient Instructions (Addendum)

## 2017-12-22 ENCOUNTER — Encounter: Payer: Self-pay | Admitting: Cardiology

## 2017-12-22 ENCOUNTER — Ambulatory Visit: Payer: Medicare Other | Admitting: Cardiology

## 2017-12-22 VITALS — BP 100/70 | HR 62 | Ht 71.0 in | Wt 207.6 lb

## 2017-12-22 DIAGNOSIS — I2584 Coronary atherosclerosis due to calcified coronary lesion: Secondary | ICD-10-CM

## 2017-12-22 DIAGNOSIS — I10 Essential (primary) hypertension: Secondary | ICD-10-CM | POA: Diagnosis not present

## 2017-12-22 DIAGNOSIS — I471 Supraventricular tachycardia: Secondary | ICD-10-CM

## 2017-12-22 DIAGNOSIS — I2510 Atherosclerotic heart disease of native coronary artery without angina pectoris: Secondary | ICD-10-CM

## 2017-12-22 DIAGNOSIS — E7849 Other hyperlipidemia: Secondary | ICD-10-CM | POA: Diagnosis not present

## 2017-12-22 MED ORDER — HYDROCHLOROTHIAZIDE 25 MG PO TABS: 25.0000 mg | ORAL_TABLET | Freq: Every day | ORAL | 3 refills | Status: DC

## 2017-12-22 MED ORDER — EZETIMIBE 10 MG PO TABS: 10.0000 mg | ORAL_TABLET | Freq: Every day | ORAL | 3 refills | Status: DC

## 2017-12-22 MED ORDER — FLECAINIDE ACETATE 50 MG PO TABS: ORAL_TABLET | ORAL | 2 refills | Status: DC

## 2017-12-22 MED ORDER — CARVEDILOL 12.5 MG PO TABS: 12.5000 mg | ORAL_TABLET | Freq: Two times a day (BID) | ORAL | 3 refills | Status: DC

## 2017-12-22 NOTE — Progress Notes (Signed)
Cardiology Office Note   Date:  12/22/2017   ID:  Timothy Dickson, Timothy Dickson Mar 03, 1946, MRN [id number]  PCP:  Antony Contras, MD  Cardiologist:  Dr. Meda Coffee    CHIEF COMPLAIN: 6 MONTHS FOLLOW UP   History of Present Illness: Timothy Dickson is a 72 y.o. male who presents for pre-op -- to have Lt total knee done 09/04/17 with Dr. Maureen Ralphs.  He has a h/o PACs, PVCs, and SVT.  He has done very well over the past 7 months without any symptoms of arrhythmia.  He is pleased with flecainide.    01/23/2017 - seen by Dr. Meda Coffee, the patient states that he has been feeling great with occasional palpitations that can be lasting 5 seconds per day. No associated chest pain dizziness or shortness of breath. He has been compliant with his medications. He saw Dr. Rayann Heman in January 2018  were recommended to decrease flecainide to 100 mg a day, this was working for him for about 4 months but then he developed more frequent palpitations and be advised to increase 150 mg a day in 3 doses.  He denies any claudications, no lower extremity edema orthopnea or proximal nocturnal dyspnea. He had a right knee replacement. He is complaining of sharp intermittent right inguinal pain that comes and goes and he hasn't felt that in the last couple weeks. He is tolerating flecainide, carvedilol and Zetia.  History of ascending aortic aneurysm measuring 43 mm with no aortic insufficiency, now mild diastolic murmur, Dr. Meda Coffee repeated thoracic MRA and also cardiac MRI to evaluate for possible aortic insufficiency in June 2018 and it was stable.    Normal stress test 03/02/17 as well.  12/22/2017 -the patient is coming after 6 months, he has been doing well, he denies any chest pain or shortness of breath, no lower extremity edema no dizziness.  He gets palpitations few times a year, they can last for several hours.  The last time he had to take extra 100 of flecainide until the arrhythmia eventually resolved.  He saw Dr. already wants to  continue current dose of 100 mg in the morning and 50 at night.   Past Medical History:  Diagnosis Date  . Atrial tachycardia ([place])    documented by holter 2016. Followed by Dr. Liane Comber and Dr. Rayann Heman.  . 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 no further tx other than surgery [facility].  [name] Cirrhosis ([place])    [facility] dx, early stage- follow up visits at [facility].  [name] CRI (chronic renal insufficiency)    s/p R nephrectomy in 1999 for malignancy  . Diabetes mellitus without complication (HCC)    NIDDM- dx. 3-4 years ago diet control  . Diverticulosis of colon    mild  . GERD (gastroesophageal reflux disease)   . Headache(784.0)   . Hepatitis C    s/p therapy [facility] (curative)/with Harvoni  . History of BPH    no recent issues  . History of substance abuse    past history -none in 38 yrs  . Hyperglycemia   . Hypertension   . Osteoarthritis, knee    knees bilaterally   . Pneumonia 1975   hx of 35 years ago    Past Surgical History:  Procedure Laterality Date  . Arthroscopy right knee     may '11 (Dr Alvan Dame)  . Arthroscopy, left knee,    . history of liver biopsy  2007  . History of Nephrectomy Right 1999  . KNEE ARTHROSCOPY  Left 10/10/2012   Procedure: LEFT KNEE ARTHROSCOPY WITH MENSCIAL DEBRIDEMENT AND CHONDROPLASTY;  Surgeon: Gearlean Alf, MD;  Location: [facility];  Service: Orthopedics;  Laterality: Left;  . left shoulder repair for bone spur  1990's  . TOTAL KNEE ARTHROPLASTY Right 08/15/2016   Procedure: RIGHT TOTAL KNEE ARTHROPLASTY, CORTISONE INJECTION OF LEFT KNEE;  Surgeon: Gaynelle Arabian, MD;  [facility]: [facility];  Service: Orthopedics;  Laterality: Right;  . TOTAL KNEE ARTHROPLASTY Left 09/04/2017   Procedure: LEFT TOTAL KNEE ARTHROPLASTY;  Surgeon: Gaynelle Arabian, MD;  [facility];  Service: Orthopedics;  Laterality: Left;  . Transurethral needle ablation procedure    . TUNA procedure       Current Outpatient Medications  Medication Sig Dispense Refill  . carvedilol  (COREG) 12.5 MG tablet Take 1 tablet (12.5 mg total) by mouth 2 (two) times daily with a meal. 180 tablet 3  . ezetimibe (ZETIA) 10 MG tablet Take 1 tablet (10 mg total) by mouth daily. 90 tablet 3  . flecainide (TAMBOCOR) 50 MG tablet Take 2 tablets by mouth in the a.m. And take 1 tablet by mouth in the afternoon 270 tablet 2  . gabapentin (NEURONTIN) 600 MG tablet Take 600 mg by mouth at bedtime.    . hydrochlorothiazide (HYDRODIURIL) 25 MG tablet Take 1 tablet (25 mg total) by mouth daily. 90 tablet 3  . omeprazole (PRILOSEC) 20 MG capsule Take 20 mg by mouth 2 (two) times a week.     No current facility-administered medications for this visit.     Allergies:   Statins; Acetaminophen; Aspirin; and Ibuprofen    Social History:  The patient  reports that he quit smoking about 40 years ago. His smoking use included cigarettes. He has a 15.00 pack-year smoking history. He has never used smokeless tobacco. He reports that he does not drink alcohol or use drugs.   Family History:  The patient's family history includes Alcohol abuse in his father, other, and sister; Breast cancer in his sister; CVA in his mother; Cancer in his brother; Coronary artery disease in his mother; Diabetes in his sister; Esophageal cancer in his sister; Lung cancer in his sister; Prostate cancer in his brother.    ROS:  General:no colds or fevers, no weight changes Skin:no rashes or ulcers HEENT:no blurred vision, no congestion CV:see HPI PUL:see HPI GI:no diarrhea constipation or melena, no indigestion GU:no hematuria, no dysuria MS:no joint pain, no claudication Neuro:no syncope, no lightheadedness Endo:+ diabetes, no thyroid disease  Wt Readings from Last 3 Encounters:  12/22/17 207 lb 9.6 oz (94.2 kg)  10/26/17 208 lb (94.3 kg)  09/04/17 207 lb 12.8 oz (94.3 kg)     PHYSICAL EXAM: VS:  BP 100/70   Pulse 62   Ht 5\' 11"  (1.803 m)   Wt 207 lb 9.6 oz (94.2 kg)   SpO2 97%   BMI 28.95 kg/m  , BMI Body  mass index is 28.95 kg/m. General:Pleasant affect, NAD Skin:Warm and dry, brisk capillary refill HEENT:normocephalic, sclera clear, mucus membranes moist Neck:supple, no JVD, no bruits  Heart:S1S2 RRR with 2/6 murmur,no gallup, rub or click Lungs:clear without rales, rhonchi, or wheezes AYT:KZSW, non tender, + BS, do not palpate liver spleen or masses Ext:no lower ext edema, 2+ pedal pulses, 2+ radial pulses Neuro:alert and oriented X 3, MAE, follows commands, + facial symmetry    EKG:  EKG is ordered today. The ekg ordered today demonstrates SR with PAC no acute changes.     Recent  Labs: 08/23/2017: ALT 21 09/06/2017: BUN 25; Creatinine, Ser 1.12; Hemoglobin 12.6; Platelets 203; Potassium 4.0; Sodium 135    Lipid Panel    Component Value Date/Time   CHOL 150 03/16/2016 1055   CHOL 206 (H) 12/21/2015 1522   TRIG 70 03/16/2016 1055   TRIG 161 (H) 12/21/2015 1522   HDL 57 03/16/2016 1055   HDL 45 12/21/2015 1522   CHOLHDL 2.6 03/16/2016 1055   VLDL 14 03/16/2016 1055   LDLCALC 79 03/16/2016 1055   LDLCALC 129 12/21/2015 1522       Other studies Reviewed: Additional studies/ records that were reviewed today include: . 03/02/17 myoview  Myocardial perfusion is normal. The study is normal. This is a low risk study. Overall left ventricular systolic function was normal. LV cavity size is normal. Nuclear stress EF: 57%. The left ventricular ejection fraction is normal (55-65%). There is no prior study for comparison.  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 Recommend follow-up aortic ultrasound in 5 years. This recommendation follows ACR consensus guidelines: White Paper of the ACR Incidental Findings Committee II on Vascular Findings. J Am Coll Radiol 2013;  10:789-794.  MRI 02/14/17 IMPRESSION: 1. Normal left ventricular size, with mild upper septal hypertrophy and normal systolic function (LVEF = 56%). 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   ASSESSMENT AND PLAN:  1.  SVT stable on coreg and flecainide, he is advised to increase his dose to 100 mg p.o. twice daily if frequency of his SVT is increases or if he is unable to break it with extra dose of flecainide.  Eventually he might need an ablation.  2.  HTN well controlled with HCTZ and Coreg.  3.  Hx of ascending aortic aneurysm stable on last MRI measuring 42 mm and no aortic regurgitation.  Repeat CTA or MRA in 2 to 3 years.    4.  Hyperlipidemia -he has liver cirrhosis and unable to take statins he is currently of Zetia that he is tolerating well.  We will continue.  5.  Coronary calcifications -seen on calcium score in 2017, he does not have chest pain and he is on Zetia for lipid management.  Current medicines are reviewed with the patient today.  The patient Has no concerns regarding medicines.  The following changes have been made:  See above Labs/ tests ordered today include:see above  Disposition:   FU:  see above  Signed, Ena Dawley, MD  12/22/2017 11:02 AM    [facility] [address], [place], [place], [place] Phone: [phone]; Fax: [phone]

## 2017-12-22 NOTE — Patient Instructions (Signed)

## 2017-12-26 DIAGNOSIS — K7460 Unspecified cirrhosis of liver: Secondary | ICD-10-CM | POA: Diagnosis not present

## 2017-12-28 DIAGNOSIS — M25562 Pain in left knee: Secondary | ICD-10-CM | POA: Diagnosis not present

## 2017-12-28 DIAGNOSIS — K7460 Unspecified cirrhosis of liver: Secondary | ICD-10-CM | POA: Diagnosis not present

## 2017-12-28 DIAGNOSIS — I10 Essential (primary) hypertension: Secondary | ICD-10-CM | POA: Diagnosis not present

## 2017-12-28 DIAGNOSIS — E1140 Type 2 diabetes mellitus with diabetic neuropathy, unspecified: Secondary | ICD-10-CM | POA: Diagnosis not present

## 2017-12-28 DIAGNOSIS — I480 Paroxysmal atrial fibrillation: Secondary | ICD-10-CM | POA: Diagnosis not present

## 2018-02-05 DIAGNOSIS — R042 Hemoptysis: Secondary | ICD-10-CM | POA: Diagnosis not present

## 2018-02-05 DIAGNOSIS — M25562 Pain in left knee: Secondary | ICD-10-CM | POA: Diagnosis not present

## 2018-02-05 DIAGNOSIS — Z8619 Personal history of other infectious and parasitic diseases: Secondary | ICD-10-CM | POA: Diagnosis not present

## 2018-02-08 DIAGNOSIS — K920 Hematemesis: Secondary | ICD-10-CM | POA: Diagnosis not present

## 2018-02-09 ENCOUNTER — Telehealth: Payer: Self-pay

## 2018-02-12 NOTE — Telephone Encounter (Signed)
It seems like encounter was open in error. There was no message stated. Will close encounter.

## 2018-02-14 DIAGNOSIS — R042 Hemoptysis: Secondary | ICD-10-CM | POA: Insufficient documentation

## 2018-02-15 ENCOUNTER — Ambulatory Visit: Payer: Medicare Other | Admitting: Pulmonary Disease

## 2018-02-15 ENCOUNTER — Ambulatory Visit (INDEPENDENT_AMBULATORY_CARE_PROVIDER_SITE_OTHER)
Admission: RE | Admit: 2018-02-15 | Discharge: 2018-02-15 | Disposition: A | Discharge status: Home / Self Care | Payer: Medicare Other | Source: Ambulatory Visit | Attending: Pulmonary Disease | Admitting: Pulmonary Disease

## 2018-02-15 ENCOUNTER — Encounter: Payer: Self-pay | Admitting: Pulmonary Disease

## 2018-02-15 VITALS — BP 126/74 | HR 62 | Ht 71.0 in | Wt 206.0 lb

## 2018-02-15 DIAGNOSIS — R042 Hemoptysis: Secondary | ICD-10-CM | POA: Diagnosis not present

## 2018-02-15 DIAGNOSIS — L570 Actinic keratosis: Secondary | ICD-10-CM | POA: Diagnosis not present

## 2018-02-15 DIAGNOSIS — J9811 Atelectasis: Secondary | ICD-10-CM | POA: Diagnosis not present

## 2018-02-15 NOTE — Patient Instructions (Signed)
Hemoptysis  Possible hematemesis/pharyngeal source of bleeding  Negative GI work-up Not on any anticoagulants/not on antiplatelets  Already saw ENT  Dime sized to less than a teaspoonful of blood on a few occasions  Likelihood of lower airway source is low  Obtain a chest x-ray  CT scan of the chest discussed the possibility if persistence  Possibility of bronchoscopy if persistence and ENT does not find any source of bleeding  I will see in the office as needed  Call if any persistence of symptoms

## 2018-02-15 NOTE — Progress Notes (Signed)
Timothy Dickson    [id number]    Dec 16, 1945  Primary Care Physician:Swayne, Shanon Brow, MD  Referring Physician: Antony Contras, MD Timothy Dickson, Timothy Dickson  Chief complaint:    Coughing up blood, minimal amount, Already evaluated by GI, did visit with ENT for possible endoscopy if needed  HPI:  With no previous history of hemoptysis He feels this bringing up blood with clearing his throat He has no nasal stuffiness or congestion Has not had any epistaxis Blood work has been negative so far-normal platelets, normal CBC Not on any antiplatelets, not on any anticoagulants He was using NSAIDs up until about 2 and a half weeks ago-this has been stopped  He has not had any episodes of frank hemoptysis  He has a history of renal cancer for which he had a nephrectomy, was followed up with several x-rays with no findings in his lungs  History of hepatitis C and cirrhosis,  His hepatitis C is cured, he continues to follow-up for his cirrhosis-stable with no progression  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 carvedilol (COREG) 12.5 MG tablet Take 1 tablet (12.5 mg total) by mouth 2 (two) times daily with a meal.  . ezetimibe (ZETIA) 10 MG tablet Take 1 tablet (10 mg total) by mouth daily.  . flecainide (TAMBOCOR) 50 MG tablet Take 2 tablets by mouth in the a.m. And take 1 tablet by mouth in the afternoon  . gabapentin (NEURONTIN) 600 MG tablet Take 600 mg by mouth at bedtime.  . hydrochlorothiazide (HYDRODIURIL) 25 MG tablet Take 1 tablet (25 mg total) by mouth daily.  [name] omeprazole (PRILOSEC) 20 MG capsule Take 20 mg by mouth 2 (two) times a week.   No facility-administered encounter medications on file as of 02/15/2018.     Allergies  as of 02/15/2018 - Review Complete 02/15/2018  Allergen Reaction Noted  . Statins  08/24/2015  . Acetaminophen Other (See Comments)   . Aspirin Other (See Comments) 07/26/2016  . Ibuprofen      Past Medical History:  Diagnosis Date  . Atrial tachycardia ([place])    documented by holter 2016. Followed by Dr. Liane Comber and Dr. Rayann Heman.  . 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 no further tx other than surgery [facility].  [name] Cirrhosis ([place])    [facility] dx, early stage- follow [facility] visits at [facility].  [name] CRI (chronic renal insufficiency)    s/p R nephrectomy in 1999 for malignancy  . Diabetes mellitus without complication (HCC)    NIDDM- dx. 3-4 years ago diet control  . Diverticulosis of colon    mild  . GERD (gastroesophageal reflux disease)   . Headache(784.0)   . Hepatitis C    s/p therapy [facility] (curative)/with Harvoni  . History of BPH    no recent issues  . History of substance abuse    past history -none in 38 yrs  . Hyperglycemia   . Hypertension   . Osteoarthritis, knee    knees bilaterally   . Pneumonia 1975   hx of 35 years ago    Past Surgical History:  Procedure Laterality Date  . Arthroscopy right knee     may '11 (Dr Alvan Dame)  . Arthroscopy, left knee,    .  history of liver biopsy  2007  . History of Nephrectomy Right 1999  . KNEE ARTHROSCOPY Left 10/10/2012   Procedure: LEFT KNEE ARTHROSCOPY WITH MENSCIAL DEBRIDEMENT AND CHONDROPLASTY;  Surgeon: Gearlean Alf, MD;  [facility];  Service: Orthopedics;  Laterality: Left;  . left shoulder repair for bone spur  1990's  . TOTAL KNEE ARTHROPLASTY Right 08/15/2016   Procedure: RIGHT TOTAL KNEE ARTHROPLASTY, CORTISONE INJECTION OF LEFT KNEE;  Surgeon: Gaynelle Arabian, MD;  [facility];  Service: Orthopedics;  Laterality: Right;  . TOTAL KNEE ARTHROPLASTY Left 09/04/2017   Procedure: LEFT TOTAL KNEE ARTHROPLASTY;  Surgeon: Gaynelle Arabian, MD;  [facility];  Service: Orthopedics;  Laterality: Left;  . Transurethral  needle ablation procedure    . TUNA procedure      Family History  Problem Relation Age of Onset  . Coronary artery disease Mother   . CVA Mother   . Prostate cancer Brother   . Alcohol abuse Father        variceal hemorrhage  . Alcohol abuse Sister   . Alcohol abuse Other        Strong family Hx  . Diabetes Sister   . Breast cancer Sister   . Lung cancer Sister   . Esophageal cancer Sister   . Cancer Brother        head and neck    Social History   Socioeconomic History  . Marital status: Married    Spouse name: Not on file  . Number of children: Not on file  . Years of education: Not on file  . Highest education level: Not on file  Occupational History  . Not on file  Social Needs  . Financial resource strain: Not on file  . Food insecurity:    Worry: Not on file    Inability: Not on file  . Transportation needs:    Medical: Not on file    Non-medical: Not on file  Tobacco Use  . Smoking status: Former Smoker    Packs/day: 1.00    Years: 15.00    Pack years: 15.00    Types: Cigarettes    Last attempt to quit: 08/01/1977    Years since quitting: 40.5  . Smokeless tobacco: Never Used  Substance and Sexual Activity  . Alcohol use: No    Comment: past history -none in 38 yrs  . Drug use: No    Comment: past hx none in 38 yrs  . Sexual activity: Yes    Partners: Female  Lifestyle  . Physical activity:    Days per week: Not on file    Minutes per session: Not on file  . Stress: Not on file  Relationships  . Social connections:    Talks on phone: Not on file    Gets together: Not on file    Attends religious service: Not on file    Active member of club or organization: Not on file    Attends meetings of clubs or organizations: Not on file    Relationship status: Not on file  . Intimate partner violence:    Fear of current or ex partner: Not on file    Emotionally abused: Not on file    Physically abused: Not on file    Forced sexual activity: Not on  file  Other Topics Concern  . Not on file  Social History Narrative   [place], [place] psych. Married- '84. 2 step-daughters, 4 grandchildren. Work  Retired Radio producer  Marriage in good health.    Review of Systems  Constitutional: Negative.   HENT: Negative.  Negative for congestion, ear pain, nosebleeds and postnasal drip.   Eyes: Negative.   Respiratory: Positive for cough. Negative for shortness of breath, wheezing and stridor.   Cardiovascular: Negative.   Endocrine: Negative.   Genitourinary: Negative.   Musculoskeletal: Negative.   Allergic/Immunologic: Negative.   Neurological: Negative.   Hematological: Negative.     Vitals:   02/15/18 1332  BP: 126/74  Pulse: 62  SpO2: 96%     Physical Exam  Constitutional: He is oriented to person, place, and time. He appears well-developed and well-nourished.  HENT:  Head: Normocephalic and atraumatic.  Eyes: Pupils are equal, round, and reactive to light. Conjunctivae and EOM are normal. Right eye exhibits no discharge. Left eye exhibits no discharge.  Neck: Normal range of motion. No tracheal deviation present. No thyromegaly present.  Cardiovascular: Normal rate and regular rhythm.  Pulmonary/Chest: Effort normal. He has no wheezes. He has no rales.  Abdominal: Soft. Bowel sounds are normal. He exhibits no distension. There is no tenderness.  Musculoskeletal: Normal range of motion. He exhibits no edema or deformity.  Neurological: He is alert and oriented to person, place, and time. No cranial nerve deficit.  Skin: Skin is warm and dry. No erythema.  Psychiatric: He has a normal mood and affect.     Data Reviewed: Records reviewed Results from recent endoscopy not available however patient does note that he was told that there were no significant findings that could explain his hemoptysis  Assessment:  1.  Hemoptysis--has not been coughing up any blood whenever he is able to bring up  anything, more clearing his throat and occurring following eating  2.  Possible hematemesis-GI evaluation negative so far  3.  Possible posterior nasal bleeding--general physical exam is unrevealing, has not had epistaxis, no irritation in his pharynx   Plan/Recommendations:  Will get a chest x-ray  Possibility of obtaining a CT scan of his chest was discussed if persistence of symptoms and no significant findings with ENT endoscopy  Possibility of a bronchoscopy with persistence of symptoms, this will be considered following obtaining a CT scan to try to localize any lesions if present  Patient was reassured, he is bringing up very minimal amounts of blood He has no systemic symptoms of an infection or any other sinister process The likelihood of lower airway/lung source of bleeding appears to be low at present  I will see him as needed I will follow-up on his chest x-ray I did encourage him to follow-up with the ENT for possible endoscopy   Sherrilyn Rist MD [facility] 02/15/2018, 2:05 PM  CC: Timothy Contras, MD

## 2018-02-16 DIAGNOSIS — K148 Other diseases of tongue: Secondary | ICD-10-CM | POA: Diagnosis not present

## 2018-02-20 ENCOUNTER — Telehealth: Payer: Self-pay | Admitting: Cardiology

## 2018-02-20 NOTE — Telephone Encounter (Signed)
New Message       [facility] Pre-operative Risk Assessment    Request for surgical clearance:  1. What type of surgery is being performed? Micro larnogosephy with a biopsy   2. When is this surgery scheduled? TBD    3. What type of clearance is required (medical clearance vs. Pharmacy clearance to hold med vs. Both)? Both   4. Are there any medications that need to be held prior to surgery and how long? Coreg to be stopped for 7 days   5. Practice name and name of physician performing surgery? [facility], Dr. Janace Hoard  6. What is your office phone number [phone]    7.   What is your office fax number [phone]  8.   Anesthesia type (None, local, MAC, general) ? General    Avaletta L Williams 02/20/2018, 1:10 PM  _________________________________________________________________   (provider comments below)

## 2018-02-21 ENCOUNTER — Telehealth: Payer: Self-pay | Admitting: Pulmonary Disease

## 2018-02-21 ENCOUNTER — Encounter: Payer: Self-pay | Admitting: Pulmonary Disease

## 2018-02-21 NOTE — Progress Notes (Signed)
See phone note dated 02/21/18

## 2018-02-21 NOTE — Progress Notes (Signed)
LMTCB

## 2018-02-21 NOTE — Telephone Encounter (Signed)
The cxr was reported as unremarkable Will close since pt advised he did not need a call back

## 2018-02-22 NOTE — Telephone Encounter (Signed)
   Primary Cardiologist: Ena Dawley, MD  Chart reviewed as part of pre-operative protocol coverage. Patient was contacted 02/22/2018 in reference to pre-operative risk assessment for pending surgery as outlined below.  Timothy Dickson was last seen on 12/22/17 by Dr. Meda Coffee.  Since that day, Timothy Dickson has done well. He can complete more than 4.0 METS. He denies chest pain, SOB, and palpitations. He is not on an anticoagulant and does not need to hold any medications prior to his procedure.   Therefore, based on ACC/AHA guidelines, the patient would be at acceptable risk for the planned procedure without further cardiovascular testing.   I will route this recommendation to the requesting party via Epic fax function and remove from pre-op pool.  Please call with questions.  Tami Lin Mykenzi Vanzile, PA 02/22/2018, 2:34 PM

## 2018-03-09 DIAGNOSIS — M19042 Primary osteoarthritis, left hand: Secondary | ICD-10-CM | POA: Diagnosis not present

## 2018-03-09 DIAGNOSIS — M79645 Pain in left finger(s): Secondary | ICD-10-CM | POA: Diagnosis not present

## 2018-03-18 ENCOUNTER — Encounter (HOSPITAL_COMMUNITY): Payer: Self-pay | Admitting: *Deleted

## 2018-03-18 ENCOUNTER — Emergency Department (HOSPITAL_COMMUNITY): Payer: Medicare Other

## 2018-03-18 ENCOUNTER — Other Ambulatory Visit: Payer: Self-pay

## 2018-03-18 ENCOUNTER — Emergency Department (HOSPITAL_COMMUNITY)
Admission: EM | Admit: 2018-03-18 | Discharge: 2018-03-19 | Disposition: A | Discharge status: Home / Self Care | Payer: Medicare Other | Attending: Emergency Medicine | Admitting: Emergency Medicine

## 2018-03-18 DIAGNOSIS — I480 Paroxysmal atrial fibrillation: Secondary | ICD-10-CM | POA: Diagnosis not present

## 2018-03-18 DIAGNOSIS — I4891 Unspecified atrial fibrillation: Secondary | ICD-10-CM | POA: Diagnosis not present

## 2018-03-18 DIAGNOSIS — E1140 Type 2 diabetes mellitus with diabetic neuropathy, unspecified: Secondary | ICD-10-CM | POA: Insufficient documentation

## 2018-03-18 DIAGNOSIS — Z85528 Personal history of other malignant neoplasm of kidney: Secondary | ICD-10-CM | POA: Insufficient documentation

## 2018-03-18 DIAGNOSIS — Z87891 Personal history of nicotine dependence: Secondary | ICD-10-CM | POA: Insufficient documentation

## 2018-03-18 DIAGNOSIS — I10 Essential (primary) hypertension: Secondary | ICD-10-CM | POA: Diagnosis not present

## 2018-03-18 DIAGNOSIS — R000 Tachycardia, unspecified: Secondary | ICD-10-CM | POA: Diagnosis present

## 2018-03-18 DIAGNOSIS — Z96653 Presence of artificial knee joint, bilateral: Secondary | ICD-10-CM | POA: Insufficient documentation

## 2018-03-18 DIAGNOSIS — Z79899 Other long term (current) drug therapy: Secondary | ICD-10-CM | POA: Diagnosis not present

## 2018-03-18 DIAGNOSIS — R42 Dizziness and giddiness: Secondary | ICD-10-CM | POA: Diagnosis not present

## 2018-03-18 LAB — CBC
HCT: 43.4 % (ref 39.0–52.0)
Hemoglobin: 14.9 g/dL (ref 13.0–17.0)
MCH: 31.1 pg (ref 26.0–34.0)
MCHC: 34.3 g/dL (ref 30.0–36.0)
MCV: 90.6 fL (ref 78.0–100.0)
Platelets: 217 10*3/uL (ref 150–400)
RBC: 4.79 MIL/uL (ref 4.22–5.81)
RDW: 13.7 % (ref 11.5–15.5)
WBC: 9.5 10*3/uL (ref 4.0–10.5)

## 2018-03-18 LAB — BASIC METABOLIC PANEL
Anion gap: 11 (ref 5–15)
BUN: 23 mg/dL (ref 8–23)
CO2: 26 mmol/L (ref 22–32)
Calcium: 9.2 mg/dL (ref 8.9–10.3)
Chloride: 98 mmol/L (ref 98–111)
Creatinine, Ser: 1.29 mg/dL — ABNORMAL HIGH (ref 0.61–1.24)
GFR calc Af Amer: >60 mL/min (ref >60)
GFR calc non Af Amer: 54 mL/min — ABNORMAL LOW (ref >60)
Glucose, Bld: 180 mg/dL — ABNORMAL HIGH (ref 70–99)
Potassium: 3.8 mmol/L (ref 3.5–5.1)
Sodium: 135 mmol/L (ref 135–145)

## 2018-03-18 LAB — TROPONIN I: Troponin I: <0.03 ng/mL

## 2018-03-18 LAB — PROTIME-INR
INR: 1.03
Prothrombin Time: 13.4 seconds (ref 11.4–15.2)

## 2018-03-18 MED ORDER — APIXABAN 5 MG PO TABS
5.0000 mg | ORAL_TABLET | ORAL | Status: AC
  Administered 2018-03-18: 5 mg via ORAL
  Filled 2018-03-18: qty 1

## 2018-03-18 MED ORDER — MIDAZOLAM HCL 2 MG/2ML IJ SOLN
4.0000 mg | Freq: Once | INTRAMUSCULAR | Status: AC
Start: 2018-03-18 — End: 2018-03-18
  Administered 2018-03-18: 4 mg via INTRAVENOUS
  Filled 2018-03-18: qty 4

## 2018-03-18 MED ORDER — APIXABAN 5 MG PO TABS: 5.0000 mg | ORAL_TABLET | Freq: Two times a day (BID) | ORAL | 0 refills | Status: DC

## 2018-03-18 MED ORDER — APIXABAN (ELIQUIS) EDUCATION KIT FOR DVT/PE PATIENTS: 1.0000 | PACK | Freq: Once | 0 refills | Status: AC

## 2018-03-18 MED ORDER — SODIUM CHLORIDE 0.9 % IV SOLN
INTRAVENOUS | Status: DC
  Administered 2018-03-18: via INTRAVENOUS

## 2018-03-18 MED ORDER — MIDAZOLAM HCL 2 MG/2ML IJ SOLN
INTRAMUSCULAR | Status: AC | PRN
  Administered 2018-03-18: 4 mg via INTRAVENOUS

## 2018-03-18 NOTE — ED Provider Notes (Signed)
[facility] DEPT Provider Note   CSN: [id number] Arrival date & time: 03/18/18  2027     History   Chief Complaint Chief Complaint  Patient presents with  . Tachycardia    HPI Timothy Dickson is a 72 y.o. male.  HPI  Patient presents with his wife who assists with the HPI. He notes that he has a history of atrial tachycardia, though no formal diagnosis of atrial fibrillation. He has had electrical studies, is currently using flecainide daily, and as needed for episodes of palpitations, discomfort. Typically the patient takes flecainide and symptoms resolved. However, the patient felt onset of palpitations earlier today a few hours ago, and in spite of taking 2 additional doses of flecainide has had no resolution of his symptoms. There is some generalized discomfort, lightheadedness, as well as the palpitations, but no focal pain. No syncope.  Patient has been in his usual state of health, but notes that a few days ago he had a particularly strenuous bike ride in hot, humid weather.   Past Medical History:  Diagnosis Date  . Atrial tachycardia ([place])    documented by holter 2016. Followed by Dr. Liane Comber and Dr. Rayann Heman.  . 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 no further tx other than surgery [facility].  [name] Cirrhosis ([place])    [facility] dx, early stage- follow up visits at [facility].  [name] CRI (chronic renal insufficiency)    s/p R nephrectomy in 1999 for malignancy  . Diabetes mellitus without complication (HCC)    NIDDM- dx. 3-4 years ago diet control  . Diverticulosis of colon    mild  . GERD (gastroesophageal reflux disease)   . Headache(784.0)   . Hepatitis C    s/p therapy [facility] (curative)/with Harvoni  . History of BPH    no recent issues  . History of substance abuse    past history -none in 38 yrs  . Hyperglycemia   . Hypertension   . Osteoarthritis, knee    knees bilaterally   . Pneumonia 1975   hx of 35 years ago    Patient  Active Problem List   Diagnosis Date Noted  . Abnormal MRI 02/28/2017  . AI (aortic insufficiency) 12/21/2015  . Ascending aortic aneurysm ([place]) 12/21/2015  . Acquired absence of kidney 04/16/2015  . Personal history of other malignant neoplasm of kidney 04/16/2015  . 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 H/O unilateral nephrectomy 04/16/2015  . Substance abuse in remission ([place]) 04/16/2015  . History of renal cell cancer 04/16/2015  . Hepatic cirrhosis ([place]) 01/09/2015  . 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 SVT (supraventricular tachycardia) ([place]) 10/13/2014  . Premature atrial complexes 10/13/2014  . Ulnar neuropathy of both upper extremities 10/13/2014  . Atrial complex, premature 10/13/2014  . Bilateral carpal tunnel syndrome 10/13/2014  . Atrial premature depolarization 10/13/2014  . Atrial tachycardia, paroxysmal ([place]) 09/09/2014  . Supraventricular tachycardia ([place]) 09/09/2014  . Carpal tunnel syndrome 08/29/2014  . Otitis, externa, infective 08/29/2014  . Cirrhosis of liver without ascites ([place]) 08/29/2014  . Chronic hepatitis C virus infection ([place]) 08/29/2014  . 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) ([place]) 08/29/2014  . Hyperlipidemia 07/01/2014  . Chronic hepatitis C ([place]) 01/27/2014  . Personal history of other infectious and parasitic diseases 01/27/2014  . History of hepatitis C 01/27/2014  . Hyperlipidemia with target LDL less than 100 10/28/2013  . Combined fat and carbohydrate induced hyperlipemia  10/28/2013  . Paroxysmal supraventricular tachycardia ([place]) 05/22/2013  . Essential hypertension, benign 10/04/2012  . Essential (primary) hypertension 10/04/2012  . Chronic low back pain 05/22/2012  . LBP (low back pain) 05/22/2012  . Low back pain 05/22/2012  . Routine health maintenance 03/21/2011  . CARCINOMA, RENAL CELL 10/26/2007  . OSTEOARTHRITIS, KNEE 10/26/2007  . DM (diabetes mellitus),  type 2 with renal complications ([place]) 00/17/4944  . OA (osteoarthritis) of knee 10/26/2007  . Type 2 diabetes mellitus ([place]) 10/26/2007    Past Surgical History:  Procedure Laterality Date  . Arthroscopy right knee     may '11 (Dr Alvan Dame)  . Arthroscopy, left knee,    . history of liver biopsy  2007  . History of Nephrectomy Right 1999  . KNEE ARTHROSCOPY Left 10/10/2012   Procedure: LEFT KNEE ARTHROSCOPY WITH MENSCIAL DEBRIDEMENT AND CHONDROPLASTY;  Surgeon: Gearlean Alf, MD;  Location: [facility];  Service: Orthopedics;  Laterality: Left;  . left shoulder repair for bone spur  1990's  . TOTAL KNEE ARTHROPLASTY Right 08/15/2016   Procedure: RIGHT TOTAL KNEE ARTHROPLASTY, CORTISONE INJECTION OF LEFT KNEE;  Surgeon: Gaynelle Arabian, MD;  Location: [facility];  Service: Orthopedics;  Laterality: Right;  . TOTAL KNEE ARTHROPLASTY Left 09/04/2017   Procedure: LEFT TOTAL KNEE ARTHROPLASTY;  Surgeon: Gaynelle Arabian, MD;  Location: [facility];  Service: Orthopedics;  Laterality: Left;  . Transurethral needle ablation procedure    . TUNA procedure          Home Medications    Prior to Admission medications   Medication Sig Start Date End Date Taking? Authorizing Provider  carvedilol (COREG) 12.5 MG tablet Take 1 tablet (12.5 mg total) by mouth 2 (two) times daily with a meal. 12/22/17   Dorothy Spark, MD  ezetimibe (ZETIA) 10 MG tablet Take 1 tablet (10 mg total) by mouth daily. 12/22/17   Dorothy Spark, MD  flecainide (TAMBOCOR) 50 MG tablet Take 2 tablets by mouth in the a.m. And take 1 tablet by mouth in the afternoon 12/22/17   Dorothy Spark, MD  gabapentin (NEURONTIN) 600 MG tablet Take 600 mg by mouth at bedtime.    [provider]  hydrochlorothiazide (HYDRODIURIL) 25 MG tablet Take 1 tablet (25 mg total) by mouth daily. 12/22/17   Dorothy Spark, MD  omeprazole (PRILOSEC) 20 MG capsule Take 20 mg by mouth 2 (two) times a week.    [provider]    Family  History Family History  Problem Relation Age of Onset  . Coronary artery disease Mother   . CVA Mother   . Prostate cancer Brother   . Alcohol abuse Father        variceal hemorrhage  . Alcohol abuse Sister   . Alcohol abuse Other        Strong family Hx  . Diabetes Sister   . Breast cancer Sister   . Lung cancer Sister   . Esophageal cancer Sister   . Cancer Brother        head and neck    Social History Social History   Tobacco Use  . Smoking status: Former Smoker    Packs/day: 1.00    Years: 15.00    Pack years: 15.00    Types: Cigarettes    Last attempt to quit: 08/01/1977    Years since quitting: 40.6  . Smokeless tobacco: Never Used  Substance Use Topics  . Alcohol use: No    Comment: past history -none in  38 yrs  . Drug use: No    Comment: past hx none in 38 yrs     Allergies   Statins; Acetaminophen; Aspirin; and Ibuprofen   Review of Systems Review of Systems  Constitutional:       Per HPI, otherwise negative  HENT:       Per HPI, otherwise negative  Respiratory:       Per HPI, otherwise negative  Cardiovascular:       Per HPI, otherwise negative  Gastrointestinal: Negative for vomiting.  Endocrine:       Negative aside from HPI  Genitourinary:       Neg aside from HPI   Musculoskeletal:       Per HPI, otherwise negative  Skin: Negative.   Neurological: Positive for light-headedness. Negative for syncope.     Physical Exam Updated Vital Signs BP (!) 114/42 (BP Location: Left Arm)   Pulse (!) 148   Resp 20   Ht 6' (1.829 m)   Wt 93.9 kg   SpO2 90%   BMI 28.07 kg/m   Physical Exam  Constitutional: He is oriented to person, place, and time. He appears well-developed. No distress.  HENT:  Head: Normocephalic and atraumatic.  Eyes: Conjunctivae and EOM are normal.  Cardiovascular: An irregularly irregular rhythm present. Tachycardia present.  Pulmonary/Chest: Effort normal. No stridor. No respiratory distress.  Abdominal: He  exhibits no distension.  Musculoskeletal: He exhibits no edema.  Neurological: He is alert and oriented to person, place, and time.  Skin: Skin is warm and dry.  Psychiatric: He has a normal mood and affect.  Nursing note and vitals reviewed.    ED Treatments / Results  Labs (all labs ordered are listed, but only abnormal results are displayed) Labs Reviewed  BASIC METABOLIC PANEL - Abnormal; Notable for the following components:      Result Value   Glucose, Bld 180 (*)    Creatinine, Ser 1.29 (*)    GFR calc non Af Amer 54 (*)    All other components within normal limits  CBC  PROTIME-INR  TROPONIN I    EKG #1 ARRIVAL EKG Interpretation  Date/Time:  Sunday March 18 2018 20:32:29 EDT Ventricular Rate:  137 PR Interval:    QRS Duration: 101 QT Interval:  316 QTC Calculation: 505 R Axis:   -95 Text Interpretation:  Atrial fibrillation with rapid V-rate Multiple ventricular premature complexes Incomplete RBBB and LAFB Anteroseptal infarct, age indeterminate Baseline wander in lead(s) V3 V4 V5 V6 Abnormal ekg Confirmed by Carmin Muskrat [phone]) on 03/18/2018 8:35:14 PM    EKG Interpretation #2 - prior to procedure  Date/Time:  Sunday March 18 2018 22:12:19 EDT Ventricular Rate:  103 PR Interval:    QRS Duration: 113 QT Interval:  364 QTC Calculation: 477 R Axis:   -49 Text Interpretation:  Atrial fibrillation LAD, consider left anterior fascicular block Low voltage, precordial leads Borderline prolonged QT interval Abnormal ekg Confirmed by Carmin Muskrat [phone]) on 03/18/2018 10:30:58 PM       EKG Interpretation #3 - post cardioversion  Date/Time:  Sunday March 18 2018 22:21:55 EDT Ventricular Rate:  72 PR Interval:    QRS Duration: 106 QT Interval:  376 QTC Calculation: 412 R Axis:   -42 Text Interpretation:  Sinus rhythm - new since last tracing Incomplete RBBB and LAFB Abnormal ekg Confirmed by Carmin Muskrat [phone]) on 03/18/2018 10:32:18 PM          Radiology Dg Chest 2 View  Result  Date: 03/18/2018 CLINICAL DATA:  Dizziness rapid pulse EXAM: CHEST - 2 VIEW COMPARISON:  02/15/2018 FINDINGS: The heart size and mediastinal contours are within normal limits. Both lungs are clear. Mild degenerative changes of the spine. IMPRESSION: No active cardiopulmonary disease. Electronically Signed   By: Donavan Foil M.D.   On: 03/18/2018 21:09    Procedures .Cardioversion Date/Time: 03/18/2018 10:10 PM Performed by: Carmin Muskrat, MD Authorized by: Carmin Muskrat, MD   Consent:    Consent obtained:  Written   Consent given by:  Patient   Risks discussed:  Induced arrhythmia and pain   Alternatives discussed:  No treatment, delayed treatment, rate-control medication, alternative treatment and referral Universal protocol:    Procedure explained and questions answered to patient or proxy's satisfaction: yes     Relevant documents present and verified: yes     Imaging studies available: yes     Required blood products, implants, devices, and special equipment available: yes     Immediately prior to procedure a time out was called: yes     Patient identity confirmed:  Verbally with patient Pre-procedure details:    Cardioversion basis:  Emergent   Rhythm:  Atrial fibrillation   Electrode placement:  Anterior-posterior Patient sedated: Yes. Refer to sedation procedure documentation for details of sedation.  Attempt one:    Cardioversion mode:  Synchronous   Waveform:  Biphasic   Shock (Joules):  120   Shock outcome:  Conversion to normal sinus rhythm Post-procedure details:    Patient status:  Awake   Patient tolerance of procedure:  Tolerated well, no immediate complications .Sedation Date/Time: 03/18/2018 10:00 PM Performed by: Carmin Muskrat, MD Authorized by: Carmin Muskrat, MD   Consent:    Consent obtained:  Written   Consent given by:  Patient   Risks discussed:  Dysrhythmia, inadequate sedation, nausea and  respiratory compromise necessitating ventilatory assistance and intubation Universal protocol:    Procedure explained and questions answered to patient or proxy's satisfaction: yes     Relevant documents present and verified: yes     Imaging studies available: yes     Required blood products, implants, devices, and special equipment available: yes     Immediately prior to procedure a time out was called: yes     Patient identity confirmation method:  Verbally with patient Indications:    Procedure performed:  Cardioversion   Procedure necessitating sedation performed by:  Physician performing sedation   Intended level of sedation:  Moderate (conscious sedation) Pre-sedation assessment:    Time since last food or drink:  3   ASA classification: class 2 - patient with mild systemic disease     Neck mobility: normal     Mouth opening:  3 or more finger widths   Mallampati score:  II - soft palate, uvula, fauces visible   Pre-sedation assessments completed and reviewed: airway patency, cardiovascular function, hydration status, mental status, nausea/vomiting, pain level, respiratory function and temperature     Pre-sedation assessment completed:  03/18/2018 10:00 PM Immediate pre-procedure details:    Reassessment: Patient reassessed immediately prior to procedure     Reviewed: vital signs and relevant labs/tests     Verified: bag valve mask available, emergency equipment available, intubation equipment available, IV patency confirmed, oxygen available and suction available   Procedure details (see MAR for exact dosages):    Preoxygenation:  Nasal cannula   Sedation:  Midazolam   Intra-procedure monitoring:  Blood pressure monitoring, cardiac monitor, continuous pulse oximetry, continuous capnometry, frequent LOC  assessments and frequent vital sign checks   Intra-procedure events: none     Total Provider sedation time (minutes):  20 Post-procedure details:    Post-sedation assessment  completed:  03/18/2018 11:00 PM   Attendance: Constant attendance by certified staff until patient recovered     Recovery: Patient returned to pre-procedure baseline     Post-sedation assessments completed and reviewed: airway patency, cardiovascular function, hydration status, mental status, nausea/vomiting, pain level, respiratory function and temperature     Patient is stable for discharge or admission: yes     Patient tolerance:  Tolerated well, no immediate complications   (including critical care time)  Medications Ordered in ED Medications  midazolam (VERSED) injection 4 mg (4 mg Intravenous Given 03/18/18 2216)     Initial Impression / Assessment and Plan / ED Course  I have reviewed the triage vital signs and the nursing notes.  Pertinent labs & imaging results that were available during my care of the patient were reviewed by me and considered in my medical decision making (see chart for details).     Initial evaluation with concern for persistent irregular tachycardia reviewed the patient's chart. Patient has multiple cardiology notes, electrolyte studies, has diagnosis of atrial tachycardia, as well as possible SVT. No clear diagnosis of A. fib, the patient states that he does not have this formal diagnosis. Patient's initial studies generally reassuring, no obvious electrolyte abnormalities, only mild change in his creatinine. After initial findings were back I discussed risks and benefits of cardioversion with the patient and his wife. Given his persistent lightheadedness, tachycardia, he agreed to proceed with procedure.  10:25 PM Patient tolerated electrical cardioversion well, without complication, successful on first attempt. He and wife aware of cardioversion excess.  Discussed patient case with our pharmacy colleagues given the patient's sole kidney, concern for renal impairment, and need for anticoagulation.   This patients CHA2DS2-VASc Score and unadjusted  Ischemic Stroke Rate (% per year) is equal to 2.2 % stroke rate/year from a score of 2  Above score calculated as 1 point each if present [CHF, HTN, DM, Vascular=MI/PAD/Aortic Plaque, Age if 65-74, or Male] Above score calculated as 2 points each if present [Age > 75, or Stroke/TIA/TE]  This elderly male with a history of atrial arrhythmia presents with atrial fibrillation with rapid ventricular response and lightheadedness. Patient has already attempted 2 doses of oral flecainide without cardioversion, and here has persistent dysrhythmia. After initial values were generally reassuring, the patient had conscious sedation, cardioversion, which was well-tolerated, successful. Patient started on anticoagulant, referred to outpatient A. fib clinic, discharged in stable condition.  Final Clinical Impressions(s) / ED Diagnoses  Atrial fibrillation with rapid ventricular response   ED Discharge Orders         Ordered    Amb referral to AFIB Clinic     03/18/18 2156           Carmin Muskrat, MD 03/18/18 2320

## 2018-03-18 NOTE — Progress Notes (Signed)
ANTICOAGULATION CONSULT NOTE - Initial Consult  Pharmacy Consult for Apixaban Indication: anticoagulant post cardioversion x 4 weeks  Allergies  Allergen Reactions  . Statins     Due to cirrhosis   . Acetaminophen Other (See Comments)    Pt states he was told by [facility] MD's to keep tylenol dosage to 2000 mg per 24 hours due to cirrhosis of liver.  . Aspirin Other (See Comments)    Pt has only 1 kidney - been told to stay away from Aspirin   . Ibuprofen     Patient Measurements: Height: 6' (182.9 cm) Weight: 207 lb (93.9 kg) IBW/kg (Calculated) : 77.6 Heparin Dosing Weight:   Vital Signs: BP: 96/74 (08/18 2250) Pulse Rate: 65 (08/18 2250)  Labs: Recent Labs    03/18/18 2109  HGB 14.9  HCT 43.4  PLT 217  LABPROT 13.4  INR 1.03  CREATININE 1.29*  TROPONINI <0.03    Estimated Creatinine Clearance: 61.6 mL/min (A) (by C-G formula based on SCr of 1.29 mg/dL (H)).   Medical History: Past Medical History:  Diagnosis Date  . Atrial tachycardia ([place])    documented by holter 2016. Followed by Dr. Liane Comber and Dr. Rayann Heman.  . 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 no further tx other than surgery [facility].  [name] Cirrhosis ([place])    [facility] dx, early stage- follow up visits at [facility].  [name] CRI (chronic renal insufficiency)    s/p R nephrectomy in 1999 for malignancy  . Diabetes mellitus without complication (HCC)    NIDDM- dx. 3-4 years ago diet control  . Diverticulosis of colon    mild  . GERD (gastroesophageal reflux disease)   . Headache(784.0)   . Hepatitis C    s/p therapy [facility] (curative)/with Harvoni  . History of BPH    no recent issues  . History of substance abuse    past history -none in 38 yrs  . Hyperglycemia   . Hypertension   . Osteoarthritis, knee    knees bilaterally   . Pneumonia 1975   hx of 35 years ago    Medications:  Scheduled:  . apixaban  5 mg Oral NOW    Assessment: Patient post cardioversion and MD wants to use apixaban for  anticoagulation.  Goal of Therapy:  Safe and effective use of apixaban for post cardioconversion    Plan:  Discussed with patient and wife apixaban use for anticoagulation.  Patient and wife recalled using rivaroxaban for ortho procedure and understood difference in dose, frequency.  Provided information sheet to wife.  Nani Skillern Crowford 03/18/2018,10:56 PM

## 2018-03-18 NOTE — Discharge Instructions (Signed)
Please be sure to monitor your condition carefully, take all medication as directed and schedule follow-up with your physician.  Return here for concerning changes.

## 2018-03-18 NOTE — ED Notes (Signed)
NS fluid bolus continues-SBP now 99-pharmacist JC at bedside talking with patient and wife about blood thinners

## 2018-03-18 NOTE — ED Triage Notes (Signed)
Pt stated "I was seen here years ago for a fast heart beat.  This started about 4p today."

## 2018-03-18 NOTE — ED Notes (Signed)
Patient transported to X-ray 

## 2018-03-18 NOTE — ED Notes (Signed)
Patient is more awake-speech is clearer-wife is at bedside with patient-patient is conversing with wife

## 2018-03-19 ENCOUNTER — Telehealth: Payer: Self-pay | Admitting: Internal Medicine

## 2018-03-19 NOTE — Telephone Encounter (Signed)
New Message:      Pt is stating that he had to go to the ER and have his heart shocked back into rhythm and he would like an appt asap. I offered an appt with the APP and pt declined.

## 2018-03-19 NOTE — Telephone Encounter (Signed)
Pt with f/u with afib clinic. No further action needed at this time.

## 2018-03-22 ENCOUNTER — Encounter (HOSPITAL_COMMUNITY): Payer: Self-pay | Admitting: Nurse Practitioner

## 2018-03-22 ENCOUNTER — Ambulatory Visit (HOSPITAL_COMMUNITY)
Admission: RE | Admit: 2018-03-22 | Discharge: 2018-03-22 | Disposition: A | Discharge status: Home / Self Care | Payer: Medicare Other | Source: Ambulatory Visit | Attending: Nurse Practitioner | Admitting: Nurse Practitioner

## 2018-03-22 VITALS — BP 112/68 | HR 64 | Ht 71.5 in | Wt 205.4 lb

## 2018-03-22 DIAGNOSIS — K219 Gastro-esophageal reflux disease without esophagitis: Secondary | ICD-10-CM | POA: Insufficient documentation

## 2018-03-22 DIAGNOSIS — N189 Chronic kidney disease, unspecified: Secondary | ICD-10-CM | POA: Insufficient documentation

## 2018-03-22 DIAGNOSIS — Z85528 Personal history of other malignant neoplasm of kidney: Secondary | ICD-10-CM | POA: Diagnosis not present

## 2018-03-22 DIAGNOSIS — I129 Hypertensive chronic kidney disease with stage 1 through stage 4 chronic kidney disease, or unspecified chronic kidney disease: Secondary | ICD-10-CM | POA: Insufficient documentation

## 2018-03-22 DIAGNOSIS — I480 Paroxysmal atrial fibrillation: Secondary | ICD-10-CM

## 2018-03-22 DIAGNOSIS — Z886 Allergy status to analgesic agent status: Secondary | ICD-10-CM | POA: Insufficient documentation

## 2018-03-22 DIAGNOSIS — Z905 Acquired absence of kidney: Secondary | ICD-10-CM | POA: Diagnosis not present

## 2018-03-22 DIAGNOSIS — Z79899 Other long term (current) drug therapy: Secondary | ICD-10-CM | POA: Diagnosis not present

## 2018-03-22 DIAGNOSIS — Z7901 Long term (current) use of anticoagulants: Secondary | ICD-10-CM | POA: Insufficient documentation

## 2018-03-22 DIAGNOSIS — B1920 Unspecified viral hepatitis C without hepatic coma: Secondary | ICD-10-CM | POA: Diagnosis not present

## 2018-03-22 DIAGNOSIS — Z96653 Presence of artificial knee joint, bilateral: Secondary | ICD-10-CM | POA: Diagnosis not present

## 2018-03-22 DIAGNOSIS — Z87891 Personal history of nicotine dependence: Secondary | ICD-10-CM | POA: Diagnosis not present

## 2018-03-22 DIAGNOSIS — Z888 Allergy status to other drugs, medicaments and biological substances status: Secondary | ICD-10-CM | POA: Diagnosis not present

## 2018-03-22 DIAGNOSIS — E1122 Type 2 diabetes mellitus with diabetic chronic kidney disease: Secondary | ICD-10-CM | POA: Insufficient documentation

## 2018-03-22 DIAGNOSIS — K7460 Unspecified cirrhosis of liver: Secondary | ICD-10-CM | POA: Insufficient documentation

## 2018-03-22 DIAGNOSIS — I4891 Unspecified atrial fibrillation: Secondary | ICD-10-CM | POA: Diagnosis not present

## 2018-03-22 NOTE — Progress Notes (Signed)
Primary Care Physician: Antony Contras, MD Referring Physician: Dr. Rayann Heman Cardiologist: Dr. Katheran Newman ABDULLOH ULLOM is a 72 y.o. 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 He is being seen in the afib clinic for new onset afib, follow up ER visit. Ekg did show afib with RVR, at 137 bpm. He took extra flecainide without any effect. He was very dizzy. He had a successful cardioversion. Since then he has stayed on 100 mg flecainide bid. No further issues. He was placed on eliquis 5 mg bid.  Today, he denies symptoms of palpitations, chest pain, shortness of breath, orthopnea, PND, lower extremity edema, dizziness, presyncope, syncope, or neurologic sequela. The patient is tolerating medications without difficulties and is otherwise without complaint today.   Past Medical History:  Diagnosis Date  . Atrial tachycardia ([place])    documented by holter 2016. Followed by Dr. Liane Comber and Dr. Rayann Heman.  . 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 no further tx other than surgery [facility].  [name] Cirrhosis ([place])    [facility] dx, early stage- follow up visits at [facility].  [name] CRI (chronic renal insufficiency)    s/p R nephrectomy in 1999 for malignancy  . Diabetes mellitus without complication (HCC)    NIDDM- dx. 3-4 years ago diet control  . Diverticulosis of colon    mild  . GERD (gastroesophageal reflux disease)   . Headache(784.0)   . Hepatitis C    s/p therapy [facility] (curative)/with Harvoni  . History of BPH    no recent issues  . History of substance abuse    past history -none in 38 yrs  . Hyperglycemia   . Hypertension   . Osteoarthritis, knee    knees bilaterally   . Pneumonia 1975   hx of 35 years ago   Past Surgical History:  Procedure Laterality Date  . Arthroscopy right knee     may '11 (Dr Alvan Dame)  . Arthroscopy, left knee,    . history of liver biopsy  2007  . History of Nephrectomy Right 1999  .  KNEE ARTHROSCOPY Left 10/10/2012   Procedure: LEFT KNEE ARTHROSCOPY WITH MENSCIAL DEBRIDEMENT AND CHONDROPLASTY;  Surgeon: Gearlean Alf, MD;  Location: [facility];  Service: Orthopedics;  Laterality: Left;  . left shoulder repair for bone spur  1990's  . TOTAL KNEE ARTHROPLASTY Right 08/15/2016   Procedure: RIGHT TOTAL KNEE ARTHROPLASTY, CORTISONE INJECTION OF LEFT KNEE;  Surgeon: Gaynelle Arabian, MD;  Location: [facility];  Service: Orthopedics;  Laterality: Right;  . TOTAL KNEE ARTHROPLASTY Left 09/04/2017   Procedure: LEFT TOTAL KNEE ARTHROPLASTY;  Surgeon: Gaynelle Arabian, MD;  [facility]: [facility];  Service: Orthopedics;  Laterality: Left;  . Transurethral needle ablation procedure    . TUNA procedure      Current Outpatient Medications  Medication Sig Dispense Refill  . apixaban (ELIQUIS) 5 MG TABS tablet Take 1 tablet (5 mg total) by mouth 2 (two) times daily. 60 tablet 0  . carvedilol (COREG) 12.5 MG tablet Take 1 tablet (12.5 mg total) by mouth 2 (two) times daily with a meal. 180 tablet 3  . ezetimibe (ZETIA) 10 MG tablet Take 1 tablet (10 mg total) by mouth daily. 90 tablet 3  . flecainide (TAMBOCOR) 50 MG tablet Take 2 tablets by mouth in the a.m. And take 1 tablet by mouth in the afternoon 270 tablet 2  . gabapentin (NEURONTIN) 600 MG  tablet Take 600 mg by mouth at bedtime.    . hydrochlorothiazide (HYDRODIURIL) 25 MG tablet Take 1 tablet (25 mg total) by mouth daily. 90 tablet 3  . omeprazole (PRILOSEC) 20 MG capsule Take 20 mg by mouth 2 (two) times a week.     No current facility-administered medications for this encounter.     Allergies  Allergen Reactions  . Statins     Due to cirrhosis   . Acetaminophen Other (See Comments)    Pt states he was told by [facility] MD's to keep tylenol dosage to 2000 mg per 24 hours due to cirrhosis of liver.  . Aspirin Other (See Comments)    Pt has only 1 kidney - been told to stay away from Aspirin   . Ibuprofen     Social History   Socioeconomic  History  . Marital status: Married    Spouse name: Not on file  . Number of children: Not on file  . Years of education: Not on file  . Highest education level: Not on file  Occupational History  . Not on file  Social Needs  . Financial resource strain: Not on file  . Food insecurity:    Worry: Not on file    Inability: Not on file  . Transportation needs:    Medical: Not on file    Non-medical: Not on file  Tobacco Use  . Smoking status: Former Smoker    Packs/day: 1.00    Years: 15.00    Pack years: 15.00    Types: Cigarettes    Last attempt to quit: 08/01/1977    Years since quitting: 40.6  . Smokeless tobacco: Never Used  Substance and Sexual Activity  . Alcohol use: No    Comment: past history -none in 38 yrs  . Drug use: No    Comment: past hx none in 38 yrs  . Sexual activity: Yes    Partners: Female  Lifestyle  . Physical activity:    Days per week: Not on file    Minutes per session: Not on file  . Stress: Not on file  Relationships  . Social connections:    Talks on phone: Not on file    Gets together: Not on file    Attends religious service: Not on file    Active member of club or organization: Not on file    Attends meetings of clubs or organizations: Not on file    Relationship status: Not on file  . Intimate partner violence:    Fear of current or ex partner: Not on file    Emotionally abused: Not on file    Physically abused: Not on file    Forced sexual activity: Not on file  Other Topics Concern  . Not on file  Social History Narrative   [place], [place] psych. Married- '84. 2 step-daughters, 4 grandchildren. Work  Retired Radio producer  Marriage in good health.    Family History  Problem Relation Age of Onset  . Coronary artery disease Mother   . CVA Mother   . Prostate cancer Brother   . Alcohol abuse Father        variceal hemorrhage  . Alcohol abuse Sister   . Alcohol abuse Other        Strong family  Hx  . Diabetes Sister   . Breast cancer Sister   . Lung cancer Sister   . Esophageal cancer Sister   . Cancer  Brother        head and neck    ROS- All systems are reviewed and negative except as per the HPI above  Physical Exam: Vitals:   03/22/18 1503  BP: 112/68  Pulse: 64  Weight: 93.2 kg  Height: 5' 11.5" (1.816 m)   Wt Readings from Last 3 Encounters:  03/22/18 93.2 kg  03/18/18 93.9 kg  02/15/18 93.4 kg    Labs: Lab Results  Component Value Date   NA 135 03/18/2018   K 3.8 03/18/2018   CL 98 03/18/2018   CO2 26 03/18/2018   GLUCOSE 180 (H) 03/18/2018   BUN 23 03/18/2018   CREATININE 1.29 (H) 03/18/2018   CALCIUM 9.2 03/18/2018   Lab Results  Component Value Date   INR 1.03 03/18/2018   Lab Results  Component Value Date   CHOL 150 03/16/2016   HDL 57 03/16/2016   LDLCALC 79 03/16/2016   TRIG 70 03/16/2016     GEN- The patient is well appearing, alert and oriented x 3 today.   Head- normocephalic, atraumatic Eyes-  Sclera clear, conjunctiva pink Ears- hearing intact Oropharynx- clear Neck- supple, no JVP Lymph- no cervical lymphadenopathy Lungs- Clear to ausculation bilaterally, normal work of breathing Heart- Regular rate and rhythm, no murmurs, rubs or gallops, PMI not laterally displaced GI- soft, NT, ND, + BS Extremities- no clubbing, cyanosis, or edema MS- no significant deformity or atrophy Skin- no rash or lesion Psych- euthymic mood, full affect Neuro- strength and sensation are intact  EKG-    Assessment and Plan: 1. New osnet afib Successful cardioversion Continue flecainide 100 mg bid, new dose for pt, had been on 50 mg bid Continue carvedilol without change General discussion re afib Triggers discussed, no excessive trigger identified Update echo  2. CHA2DS2VASc score of 4 Bleeding precautions discussed   Continue eliquis 5 mg bid    Will have pt f/u with Dr.Allred in 2-4 weeks Consider f/u cbc/bmet at that visit, as  well as consider ETT with new dose of flecainide   Butch Penny C. Ludy Messamore, [facility] [address] [place], [place] [address] [phone]

## 2018-03-23 ENCOUNTER — Ambulatory Visit (HOSPITAL_COMMUNITY)
Admission: RE | Admit: 2018-03-23 | Discharge: 2018-03-23 | Disposition: A | Discharge status: Home / Self Care | Payer: Medicare Other | Source: Ambulatory Visit | Attending: Nurse Practitioner | Admitting: Nurse Practitioner

## 2018-03-23 DIAGNOSIS — I480 Paroxysmal atrial fibrillation: Secondary | ICD-10-CM | POA: Diagnosis not present

## 2018-03-23 NOTE — Progress Notes (Signed)
Echocardiogram 2D Echocardiogram has been performed.  Timothy Dickson 03/23/2018, 3:52 PM

## 2018-03-29 DIAGNOSIS — Z0130 Encounter for examination of blood pressure without abnormal findings: Secondary | ICD-10-CM | POA: Diagnosis not present

## 2018-03-29 DIAGNOSIS — H6123 Impacted cerumen, bilateral: Secondary | ICD-10-CM | POA: Diagnosis not present

## 2018-03-29 DIAGNOSIS — J029 Acute pharyngitis, unspecified: Secondary | ICD-10-CM | POA: Diagnosis not present

## 2018-03-29 DIAGNOSIS — E782 Mixed hyperlipidemia: Secondary | ICD-10-CM | POA: Diagnosis not present

## 2018-03-31 DIAGNOSIS — H6123 Impacted cerumen, bilateral: Secondary | ICD-10-CM | POA: Diagnosis not present

## 2018-04-03 DIAGNOSIS — M19042 Primary osteoarthritis, left hand: Secondary | ICD-10-CM | POA: Diagnosis not present

## 2018-04-06 DIAGNOSIS — M19042 Primary osteoarthritis, left hand: Secondary | ICD-10-CM | POA: Diagnosis not present

## 2018-04-09 ENCOUNTER — Encounter (HOSPITAL_COMMUNITY): Payer: Self-pay | Admitting: *Deleted

## 2018-04-10 ENCOUNTER — Telehealth: Payer: Self-pay | Admitting: *Deleted

## 2018-04-10 NOTE — Telephone Encounter (Signed)
   [facility] Pre-operative Risk Assessment    Request for surgical clearance:  1. What type of surgery is being performed?  LEFT INDEX FINGER PROX IP JOINT ARTHRODESIS   2. When is this surgery scheduled?  04/25/18   3. What type of clearance is required (medical clearance vs. Pharmacy clearance to hold med vs. Both)?  BOTH  4. Are there any medications that need to be held prior to surgery and how long? ELIQUIS   5. Practice name and name of physician performing surgery?  [facility] DR. ORTMANN   6. What is your office phone number [phone]     7.   What is your office fax number [phone]  8.   Anesthesia type (None, local, MAC, general) ?  GENERAL   Jeanann Lewandowsky 04/10/2018, 3:27 PM  _________________________________________________________________   (provider comments below)

## 2018-04-11 ENCOUNTER — Ambulatory Visit: Payer: Medicare Other | Admitting: Internal Medicine

## 2018-04-11 ENCOUNTER — Encounter: Payer: Self-pay | Admitting: Internal Medicine

## 2018-04-11 VITALS — BP 116/72 | HR 63 | Ht 71.0 in | Wt 204.8 lb

## 2018-04-11 DIAGNOSIS — Z01818 Encounter for other preprocedural examination: Secondary | ICD-10-CM

## 2018-04-11 DIAGNOSIS — I471 Supraventricular tachycardia: Secondary | ICD-10-CM

## 2018-04-11 DIAGNOSIS — I481 Persistent atrial fibrillation: Secondary | ICD-10-CM

## 2018-04-11 DIAGNOSIS — I4819 Other persistent atrial fibrillation: Secondary | ICD-10-CM

## 2018-04-11 MED ORDER — FLECAINIDE ACETATE 100 MG PO TABS: 100.0000 mg | ORAL_TABLET | Freq: Two times a day (BID) | ORAL | 2 refills | Status: DC

## 2018-04-11 NOTE — Progress Notes (Signed)
PCP: Antony Contras, MD Primary Cardiologist: Dr Meda Coffee Primary EP: Dr Thompson Grayer Timothy Dickson is a 72 y.o. male who presents today for routine electrophysiology followup.  Since last being seen in our clinic, the patient reports doing reasonably well.  He recently developed new onset afib.  He required cardioversion.  He was placed on flecainide.  He has done wells ince that time.  Today, he denies symptoms of palpitations, chest pain, shortness of breath,  lower extremity edema, dizziness, presyncope, or syncope.  The patient is otherwise without complaint today.   Past Medical History:  Diagnosis Date  . Atrial tachycardia ([place])    documented by holter 2016. Followed by Dr. Liane Comber and Dr. Rayann Heman.  . 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 no further tx other than surgery [facility]  [name] Cirrhosis ([place])    [facility] dx, early stage- follow up visits at [facility].  [name] CRI (chronic renal insufficiency)    s/p R nephrectomy in 1999 for malignancy  . Diabetes mellitus without complication (HCC)    NIDDM- dx. 3-4 years ago diet control  . Diverticulosis of colon    mild  . GERD (gastroesophageal reflux disease)   . Headache(784.0)   . Hepatitis C    s/p therapy [facility] (curative)/with Harvoni  . History of BPH    no recent issues  . History of substance abuse    past history -none in 38 yrs  . Hyperglycemia   . Hypertension   . Osteoarthritis, knee    knees bilaterally   . Pneumonia 1975   hx of 35 years ago   Past Surgical History:  Procedure Laterality Date  . Arthroscopy right knee     may '11 (Dr Alvan Dame)  . Arthroscopy, left knee,    . history of liver biopsy  2007  . History of Nephrectomy Right 1999  . KNEE ARTHROSCOPY Left 10/10/2012   Procedure: LEFT KNEE ARTHROSCOPY WITH MENSCIAL DEBRIDEMENT AND CHONDROPLASTY;  Surgeon: Gearlean Alf, MD;  [facility];  Service: Orthopedics;  Laterality: Left;  . left shoulder repair for bone spur  1990's  . TOTAL KNEE ARTHROPLASTY Right  08/15/2016   Procedure: RIGHT TOTAL KNEE ARTHROPLASTY, CORTISONE INJECTION OF LEFT KNEE;  Surgeon: Gaynelle Arabian, MD;  [facility];  Service: Orthopedics;  Laterality: Right;  . TOTAL KNEE ARTHROPLASTY Left 09/04/2017   Procedure: LEFT TOTAL KNEE ARTHROPLASTY;  Surgeon: Gaynelle Arabian, MD;  [facility];  Service: Orthopedics;  Laterality: Left;  . Transurethral needle ablation procedure    . TUNA procedure      ROS- all systems are reviewed and negatives except as per HPI above  Current Outpatient Medications  Medication Sig Dispense Refill  . apixaban (ELIQUIS) 5 MG TABS tablet Take 1 tablet (5 mg total) by mouth 2 (two) times daily. 60 tablet 0  . b complex vitamins capsule Take 2 capsules by mouth daily.    . carvedilol (COREG) 12.5 MG tablet Take 1 tablet (12.5 mg total) by mouth 2 (two) times daily with a meal. 180 tablet 3  . ezetimibe (ZETIA) 10 MG tablet Take 1 tablet (10 mg total) by mouth daily. 90 tablet 3  . flecainide (TAMBOCOR) 50 MG tablet Take 2 tablets by mouth in the a.m. And take 1 tablet by mouth in the afternoon 270 tablet 2  . gabapentin (NEURONTIN) 600 MG tablet Take 600 mg by mouth at bedtime.    . hydrochlorothiazide (HYDRODIURIL) 25 MG tablet Take 1 tablet (  25 mg total) by mouth daily. 90 tablet 3  . omeprazole (PRILOSEC) 20 MG capsule Take 20 mg by mouth 2 (two) times a week.    . vitamin C (ASCORBIC ACID) 500 MG tablet Take 500 mg by mouth daily.     No current facility-administered medications for this visit.     Physical Exam: Vitals:   04/11/18 1401  BP: 116/72  Pulse: 63  SpO2: 94%  Weight: 204 lb 12.8 oz (92.9 kg)  Height: 5\' 11"  (1.803 m)    GEN- The patient is well appearing, alert and oriented x 3 today.   Head- normocephalic, atraumatic Eyes-  Sclera clear, conjunctiva pink Ears- hearing intact Oropharynx- clear Lungs- Clear to ausculation bilaterally, normal work of breathing Heart- Regular rate and rhythm, no murmurs, rubs or  gallops, PMI not laterally displaced GI- soft, NT, ND, + BS Extremities- no clubbing, cyanosis, or edema  Wt Readings from Last 3 Encounters:  04/11/18 204 lb 12.8 oz (92.9 kg)  03/22/18 205 lb 6.4 oz (93.2 kg)  03/18/18 207 lb (93.9 kg)    EKG tracing ordered today is personally reviewed and shows sinus rhythm 63 bpm, PR 174 msec, QRS 100 msec, Qtc 437 msec, incomplete RBBB  Echo 03/23/18 EF 60%, mild LA enlargement  Assessment and Plan:  1. Persistent afib Currently doing well with flecainide chads2vasc score is 4.  Continue eliquis We discussed ablation today.  He would prefer to continue medical therapy Obtain ETT to evaluate for exercise induced arrhythmias with flecainide  2. SVT Stable No change required today  3. HTN Stable No change required today  4. preop Ok to proceed with hand surgery without further CV testing Ok to hold eliquis 24-48 hours prior  Follow-up in AF clinic in 3 months  Thompson Grayer MD, [facility] 04/11/2018 2:09 PM

## 2018-04-11 NOTE — Patient Instructions (Signed)
Medication Instructions:  Your physician recommends that you continue on your current medications as directed. Please refer to the Current Medication list given to you today.  * If you need a refill on your cardiac medications before your next appointment, please call your pharmacy.   Labwork: None ordered  Testing/Procedures: Your physician has requested that you have an exercise tolerance test. For further information please visit [email or web address]. Please also follow instruction sheet, as given.  Follow-Up: Your physician recommends that you schedule a follow-up appointment in: 3 months with Roderic Palau, NP in the AFib clinic.  Thank you for choosing [facility]!!   Trinidad Curet, RN [phone]  Any Other Special Instructions Will Be Listed Below (If Applicable).

## 2018-04-11 NOTE — Telephone Encounter (Signed)
   Primary Cardiologist: Ena Dawley, MD  Chart reviewed as part of pre-operative protocol coverage. Given past medical history and time since last visit, based on ACC/AHA guidelines, Timothy Dickson would be at acceptable risk for the planned procedure without further cardiovascular testing.   Hold Eliquis 48 hours pre op  I will route this recommendation to the requesting party via Epic fax function and remove from pre-op pool.  Please call with questions.  Kerin Ransom, PA-C 04/11/2018, 4:08 PM

## 2018-04-11 NOTE — Telephone Encounter (Signed)
Patient with diagnosis of atrial fibrillation on Eliquis for anticoagulation.    Procedure: left index finger prox IP joint arthrodesis Date of procedure: 04/25/18  CHADS2-VASc score of  3 (, HTN, AGE, DM2, )  CrCl 63.5 Platelet count 217  Per office protocol, patient can hold Eliquis for 2 days prior to procedure.    Patient should restart Eliquis on the evening of procedure or day after, at discretion of procedure MD

## 2018-04-15 ENCOUNTER — Other Ambulatory Visit: Payer: Self-pay | Admitting: Internal Medicine

## 2018-04-16 ENCOUNTER — Telehealth: Payer: Self-pay | Admitting: Internal Medicine

## 2018-04-16 NOTE — Telephone Encounter (Signed)
New message:      Pt c/o medication issue:  1. Name of Medication: flecainide (TAMBOCOR) 100 MG tablet  2. How are you currently taking this medication (dosage and times per day)? Take 1 tablet (100 mg total) by mouth 2 (two) times daily.  3. Are you having a reaction (difficulty breathing--STAT)? No  4. What is your medication issue? Pt states he mistakenly took 2 of these pills and they are supposed to be taken 12 hrs apart

## 2018-04-16 NOTE — Telephone Encounter (Signed)
Responded via MyChart.

## 2018-04-17 ENCOUNTER — Ambulatory Visit (INDEPENDENT_AMBULATORY_CARE_PROVIDER_SITE_OTHER): Payer: Medicare Other

## 2018-04-17 DIAGNOSIS — I4819 Other persistent atrial fibrillation: Secondary | ICD-10-CM

## 2018-04-17 DIAGNOSIS — I481 Persistent atrial fibrillation: Secondary | ICD-10-CM | POA: Diagnosis not present

## 2018-04-17 LAB — EXERCISE TOLERANCE TEST
Estimated workload: 0 METS
Exercise duration (min): 9 min
Exercise duration (sec): 9 s
MPHR: 148 {beats}/min
Peak HR: 120 {beats}/min
Percent HR: 81 %
RPE: 16
Rest HR: 61 {beats}/min

## 2018-04-25 DIAGNOSIS — M19042 Primary osteoarthritis, left hand: Secondary | ICD-10-CM | POA: Diagnosis not present

## 2018-04-25 DIAGNOSIS — M13842 Other specified arthritis, left hand: Secondary | ICD-10-CM | POA: Diagnosis not present

## 2018-04-27 ENCOUNTER — Telehealth (HOSPITAL_COMMUNITY): Payer: Self-pay | Admitting: *Deleted

## 2018-04-27 NOTE — Telephone Encounter (Signed)
Patient called in stating he went into afib this morning. His HR is in the 100-115 BP 107/70. Discussed with Roderic Palau NP - take an extra 1/2 tab of coreg now. ER precautions reviewed with patient for over weekend. If still in AF on Monday he will call to be seen. Pt in agreement.

## 2018-05-08 DIAGNOSIS — Z5189 Encounter for other specified aftercare: Secondary | ICD-10-CM | POA: Diagnosis not present

## 2018-05-08 DIAGNOSIS — M25642 Stiffness of left hand, not elsewhere classified: Secondary | ICD-10-CM | POA: Diagnosis not present

## 2018-05-08 DIAGNOSIS — M13842 Other specified arthritis, left hand: Secondary | ICD-10-CM | POA: Diagnosis not present

## 2018-06-01 DIAGNOSIS — Z23 Encounter for immunization: Secondary | ICD-10-CM | POA: Diagnosis not present

## 2018-06-04 DIAGNOSIS — K7460 Unspecified cirrhosis of liver: Secondary | ICD-10-CM | POA: Diagnosis not present

## 2018-06-05 DIAGNOSIS — Z5189 Encounter for other specified aftercare: Secondary | ICD-10-CM | POA: Diagnosis not present

## 2018-06-05 DIAGNOSIS — M13842 Other specified arthritis, left hand: Secondary | ICD-10-CM | POA: Diagnosis not present

## 2018-06-19 DIAGNOSIS — H5203 Hypermetropia, bilateral: Secondary | ICD-10-CM | POA: Diagnosis not present

## 2018-07-05 DIAGNOSIS — M13842 Other specified arthritis, left hand: Secondary | ICD-10-CM | POA: Diagnosis not present

## 2018-07-05 DIAGNOSIS — Z5189 Encounter for other specified aftercare: Secondary | ICD-10-CM | POA: Diagnosis not present

## 2018-07-16 ENCOUNTER — Telehealth: Payer: Self-pay

## 2018-07-16 ENCOUNTER — Ambulatory Visit (HOSPITAL_COMMUNITY)
Admission: RE | Admit: 2018-07-16 | Discharge: 2018-07-16 | Disposition: A | Discharge status: Home / Self Care | Payer: Medicare Other | Source: Ambulatory Visit | Attending: Nurse Practitioner | Admitting: Nurse Practitioner

## 2018-07-16 VITALS — BP 130/74 | HR 66 | Ht 71.0 in | Wt 211.0 lb

## 2018-07-16 DIAGNOSIS — Z87891 Personal history of nicotine dependence: Secondary | ICD-10-CM | POA: Insufficient documentation

## 2018-07-16 DIAGNOSIS — I071 Rheumatic tricuspid insufficiency: Secondary | ICD-10-CM | POA: Diagnosis not present

## 2018-07-16 DIAGNOSIS — Z905 Acquired absence of kidney: Secondary | ICD-10-CM | POA: Diagnosis not present

## 2018-07-16 DIAGNOSIS — Z79899 Other long term (current) drug therapy: Secondary | ICD-10-CM | POA: Insufficient documentation

## 2018-07-16 DIAGNOSIS — N189 Chronic kidney disease, unspecified: Secondary | ICD-10-CM | POA: Diagnosis not present

## 2018-07-16 DIAGNOSIS — Z96653 Presence of artificial knee joint, bilateral: Secondary | ICD-10-CM | POA: Insufficient documentation

## 2018-07-16 DIAGNOSIS — Z7901 Long term (current) use of anticoagulants: Secondary | ICD-10-CM | POA: Diagnosis not present

## 2018-07-16 DIAGNOSIS — M170 Bilateral primary osteoarthritis of knee: Secondary | ICD-10-CM | POA: Diagnosis not present

## 2018-07-16 DIAGNOSIS — E1122 Type 2 diabetes mellitus with diabetic chronic kidney disease: Secondary | ICD-10-CM | POA: Insufficient documentation

## 2018-07-16 DIAGNOSIS — B1920 Unspecified viral hepatitis C without hepatic coma: Secondary | ICD-10-CM | POA: Diagnosis not present

## 2018-07-16 DIAGNOSIS — K219 Gastro-esophageal reflux disease without esophagitis: Secondary | ICD-10-CM | POA: Insufficient documentation

## 2018-07-16 DIAGNOSIS — I4510 Unspecified right bundle-branch block: Secondary | ICD-10-CM | POA: Diagnosis not present

## 2018-07-16 DIAGNOSIS — I4819 Other persistent atrial fibrillation: Secondary | ICD-10-CM | POA: Diagnosis not present

## 2018-07-16 DIAGNOSIS — Z8249 Family history of ischemic heart disease and other diseases of the circulatory system: Secondary | ICD-10-CM | POA: Insufficient documentation

## 2018-07-16 DIAGNOSIS — K5730 Diverticulosis of large intestine without perforation or abscess without bleeding: Secondary | ICD-10-CM | POA: Insufficient documentation

## 2018-07-16 DIAGNOSIS — K7460 Unspecified cirrhosis of liver: Secondary | ICD-10-CM | POA: Insufficient documentation

## 2018-07-16 DIAGNOSIS — N400 Enlarged prostate without lower urinary tract symptoms: Secondary | ICD-10-CM | POA: Insufficient documentation

## 2018-07-16 DIAGNOSIS — Z85528 Personal history of other malignant neoplasm of kidney: Secondary | ICD-10-CM | POA: Insufficient documentation

## 2018-07-16 DIAGNOSIS — I129 Hypertensive chronic kidney disease with stage 1 through stage 4 chronic kidney disease, or unspecified chronic kidney disease: Secondary | ICD-10-CM | POA: Diagnosis not present

## 2018-07-16 NOTE — Telephone Encounter (Signed)
-----   Message from Juluis Mire, RN sent at 07/16/2018  2:07 PM EST ----- Regarding: ready for ablation Pt saw ricky today - he said hes now ready to schedule ablation for January he can be reached at his home phone.  Thanks [employer]

## 2018-07-16 NOTE — Progress Notes (Signed)
Primary Care Physician: Antony Contras, MD Primary Cardiologist: Dr Meda Coffee Primary Electrophysiologist: Dr Rogue Jury is a 72 y.o. 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  Since last being seen in clinic, the patient reports doing reasonably well on the higher dose of flecainide. However, he has had three breakthrough episodes of afib since his last visit. He feels very fatigued and short of breath with palpitations during his episodes.    Today, he denies symptoms of chest pain, orthopnea, PND, lower extremity edema, dizziness, presyncope, syncope, snoring, daytime somnolence, bleeding, or neurologic sequela. The patient is tolerating medications without difficulties and is otherwise without complaint today. +palpitations, +fatigue, +SOB   Atrial Fibrillation Risk Factors:  he does not have symptoms or diagnosis of sleep apnea.  he does not have a history of rheumatic fever.  he does not have a history of alcohol use.  he has a BMI of Body mass index is 29.43 kg/m.[name] Filed Weights   07/16/18 1341  Weight: 95.7 kg    LA size: 60mm   Atrial Fibrillation Management history:  Previous antiarrhythmic drugs: flecainide  Previous cardioversions: 03/18/18  Previous ablations: none  CHADS2VASC score: 4  Anticoagulation history: Eliquis   Past Medical History:  Diagnosis Date  . Atrial tachycardia ([place])    documented by holter 2016. Followed by Dr. Liane Comber and Dr. Rayann Heman.  . 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 no further tx other than surgery [facility].  [name] Cirrhosis ([place])    [facility] dx, early stage- follow up visits at [facility].  [name] CRI (chronic renal insufficiency)    s/p R nephrectomy in 1999 for malignancy  . Diabetes mellitus without complication (HCC)    NIDDM- dx. 3-4 years ago diet control  . Diverticulosis of colon    mild  . GERD (gastroesophageal reflux disease)   . Headache(784.0)   . Hepatitis C    s/p therapy [facility] (curative)/with Harvoni  . History of BPH    no recent issues  . History of substance abuse    past history -none in 38 yrs  . Hyperglycemia   . Hypertension   . Osteoarthritis, knee    knees bilaterally   . Pneumonia 1975   hx of 35 years ago   Past Surgical History:  Procedure Laterality Date  . Arthroscopy right knee     may '11 (Dr Alvan Dame)  . Arthroscopy, left knee,    . history of liver biopsy  2007  . History of Nephrectomy Right 1999  . KNEE ARTHROSCOPY Left 10/10/2012   Procedure: LEFT KNEE ARTHROSCOPY WITH MENSCIAL DEBRIDEMENT AND CHONDROPLASTY;  Surgeon: Gearlean Alf, MD;  Location: [facility];  Service: Orthopedics;  Laterality: Left;  . left shoulder repair for bone spur  1990's  . TOTAL KNEE ARTHROPLASTY Right 08/15/2016   Procedure: RIGHT TOTAL KNEE ARTHROPLASTY, CORTISONE INJECTION OF LEFT KNEE;  Surgeon: Gaynelle Arabian, MD;  Location: [facility];  Service: Orthopedics;  Laterality: Right;  . TOTAL KNEE ARTHROPLASTY Left 09/04/2017   Procedure: LEFT TOTAL KNEE ARTHROPLASTY;  Surgeon: Gaynelle Arabian, MD;  Location: [facility];  Service: Orthopedics;  Laterality: Left;  . Transurethral needle ablation procedure    . TUNA procedure      Current Outpatient Medications  Medication Sig Dispense Refill  . b complex vitamins capsule Take 2 capsules by mouth daily.    [name]  carvedilol (COREG) 12.5 MG tablet Take 1 tablet (12.5 mg total) by mouth 2 (two) times daily with a meal. 180 tablet 3  . ELIQUIS 5 MG TABS tablet TAKE 1 TABLET BY MOUTH TWICE DAILY. 60 tablet 5  . ezetimibe (ZETIA) 10 MG tablet Take 1 tablet (10 mg total) by mouth daily. 90 tablet 3  . flecainide (TAMBOCOR) 100 MG tablet Take 1 tablet (100 mg total) by mouth 2 (two) times daily. 120 tablet 2  . gabapentin (NEURONTIN) 600 MG tablet Take 600 mg by mouth at bedtime.    . hydrochlorothiazide (HYDRODIURIL) 25 MG tablet  Take 1 tablet (25 mg total) by mouth daily. 90 tablet 3  . omeprazole (PRILOSEC) 20 MG capsule Take 20 mg by mouth 3 (three) times a week.     . vitamin C (ASCORBIC ACID) 500 MG tablet Take 500 mg by mouth daily.     No current facility-administered medications for this encounter.     Allergies  Allergen Reactions  . Statins     Due to cirrhosis   . Acetaminophen Other (See Comments)    Pt states he was told by [facility] MD's to keep tylenol dosage to 2000 mg per 24 hours due to cirrhosis of liver.  . Aspirin Other (See Comments)    Pt has only 1 kidney - been told to stay away from Aspirin   . Ibuprofen     Social History   Socioeconomic History  . Marital status: Married    Spouse name: Not on file  . Number of children: Not on file  . Years of education: Not on file  . Highest education level: Not on file  Occupational History  . Not on file  Social Needs  . Financial resource strain: Not on file  . Food insecurity:    Worry: Not on file    Inability: Not on file  . Transportation needs:    Medical: Not on file    Non-medical: Not on file  Tobacco Use  . Smoking status: Former Smoker    Packs/day: 1.00    Years: 15.00    Pack years: 15.00    Types: Cigarettes    Last attempt to quit: 08/01/1977    Years since quitting: 40.9  . Smokeless tobacco: Never Used  Substance and Sexual Activity  . Alcohol use: No    Comment: past history -none in 38 yrs  . Drug use: No    Comment: past hx none in 38 yrs  . Sexual activity: Yes    Partners: Female  Lifestyle  . Physical activity:    Days per week: Not on file    Minutes per session: Not on file  . Stress: Not on file  Relationships  . Social connections:    Talks on phone: Not on file    Gets together: Not on file    Attends religious service: Not on file    Active member of club or organization: Not on file    Attends meetings of clubs or organizations: Not on file    Relationship status: Not on file  . Intimate  partner violence:    Fear of current or ex partner: Not on file    Emotionally abused: Not on file    Physically abused: Not on file    Forced sexual activity: Not on file  Other Topics Concern  . Not on file  Social History Narrative   [place], [place] psych. Married- '84.  2 step-daughters, 4 grandchildren. Work  Retired Radio producer  Marriage in good health.    Family History  Problem Relation Age of Onset  . Coronary artery disease Mother   . CVA Mother   . Prostate cancer Brother   . Alcohol abuse Father        variceal hemorrhage  . Alcohol abuse Sister   . Alcohol abuse Other        Strong family Hx  . Diabetes Sister   . Breast cancer Sister   . Lung cancer Sister   . Esophageal cancer Sister   . Cancer Brother        head and neck    ROS- All systems are reviewed and negative except as per the HPI above.  Physical Exam: Vitals:   07/16/18 1341  BP: 130/74  Pulse: 66  Weight: 95.7 kg  Height: 5\' 11"  (1.803 m)    GEN- The patient is well appearing, alert and oriented x 3 today.   Head- normocephalic, atraumatic Eyes-  Sclera clear, conjunctiva pink Ears- hearing intact Oropharynx- clear Neck- supple  Lungs- Clear to ausculation bilaterally, normal work of breathing Heart- Regular rate and rhythm, no murmurs, rubs or gallops  Extremities- no clubbing, cyanosis, or edema MS- no significant deformity or atrophy Skin- no rash or lesion Psych- euthymic mood, full affect Neuro- strength and sensation are intact  Wt Readings from Last 3 Encounters:  07/16/18 95.7 kg  04/11/18 92.9 kg  03/22/18 93.2 kg    EKG today demonstrates sinus rhythm HR 66, LAD, inc RBBB  Echo 03/23/18 demonstrated: - Left ventricle: The cavity size was normal. Systolic function was   normal. The estimated ejection fraction was in the range of 60%   to 65%. Wall motion was normal; there were no regional wall   motion abnormalities. Doppler  parameters are consistent with   abnormal left ventricular relaxation (grade 1 diastolic   dysfunction). There was no evidence of elevated ventricular   filling pressure by Doppler parameters. - Aortic valve: There was no regurgitation. - Aortic root: The aortic root was normal in size. - Left atrium: The atrium was mildly dilated. - Right ventricle: The cavity size was normal. Wall thickness was   normal. Systolic function was normal. - Right atrium: The atrium was normal in size. - Tricuspid valve: There was mild regurgitation. - Pulmonary arteries: Systolic pressure was at the upper limits of   normal. - Inferior vena cava: The vessel was normal in size. - Pericardium, extracardiac: There was no pericardial effusion.  ETT 04/17/18 ETT with good exercise tolerance (9:09); no chest pain; normal blood pressure response; no ST changes; negative adequate exercise tolerance test; [facility] treadmill score 9.  Epic records are reviewed at length today  Assessment and Plan:  1. Persistent atrial fibrillation Currently on flecainide 100 mg BID and Eliquis 5 mg BID for a CHADS2VASC of 4. Recent ETT normal. Patient had previously discussed ablation with Dr Rayann Heman. Initially, patient opted for medical therapy but with his recent breakthrough symptoms he would like to move forward with ablation.  Risk, benefits, and alternatives to EP study and radiofrequency ablation for afib were also discussed in detail today. These risks include but are not limited to stroke, bleeding, vascular damage, tamponade, perforation, damage to the esophagus, lungs, and other structures, pulmonary vein stenosis, worsening renal function, and death. The patient understands these risk and wishes to proceed.   2. HTN Stable, no changes today  Follow up with  Dr Rayann Heman for ablation evaluation. Follow up in Afib clinic post-ablation.  Oliver Barre, [place] 07/16/2018 3:04 PM

## 2018-07-16 NOTE — Telephone Encounter (Signed)
Pt has decided to consider ablation in March.  No action needed at this time.

## 2018-07-19 ENCOUNTER — Other Ambulatory Visit: Payer: Self-pay | Admitting: Family Medicine

## 2018-07-19 ENCOUNTER — Ambulatory Visit
Admission: RE | Admit: 2018-07-19 | Discharge: 2018-07-19 | Disposition: A | Discharge status: Home / Self Care | Payer: Medicare Other | Source: Ambulatory Visit | Attending: Family Medicine | Admitting: Family Medicine

## 2018-07-19 DIAGNOSIS — R1031 Right lower quadrant pain: Secondary | ICD-10-CM

## 2018-07-19 DIAGNOSIS — E1142 Type 2 diabetes mellitus with diabetic polyneuropathy: Secondary | ICD-10-CM | POA: Diagnosis not present

## 2018-07-19 DIAGNOSIS — R1012 Left upper quadrant pain: Secondary | ICD-10-CM | POA: Diagnosis not present

## 2018-07-19 DIAGNOSIS — E1140 Type 2 diabetes mellitus with diabetic neuropathy, unspecified: Secondary | ICD-10-CM | POA: Diagnosis not present

## 2018-07-19 DIAGNOSIS — I480 Paroxysmal atrial fibrillation: Secondary | ICD-10-CM | POA: Diagnosis not present

## 2018-07-19 DIAGNOSIS — I10 Essential (primary) hypertension: Secondary | ICD-10-CM | POA: Diagnosis not present

## 2018-07-31 DIAGNOSIS — Z23 Encounter for immunization: Secondary | ICD-10-CM | POA: Diagnosis not present

## 2018-07-31 DIAGNOSIS — L570 Actinic keratosis: Secondary | ICD-10-CM | POA: Diagnosis not present

## 2018-08-16 DIAGNOSIS — M13842 Other specified arthritis, left hand: Secondary | ICD-10-CM | POA: Diagnosis not present

## 2018-09-13 ENCOUNTER — Encounter: Payer: Self-pay | Admitting: Internal Medicine

## 2018-09-27 DIAGNOSIS — M47816 Spondylosis without myelopathy or radiculopathy, lumbar region: Secondary | ICD-10-CM | POA: Diagnosis not present

## 2018-09-29 DIAGNOSIS — H6980 Other specified disorders of Eustachian tube, unspecified ear: Secondary | ICD-10-CM | POA: Diagnosis not present

## 2018-09-29 DIAGNOSIS — R42 Dizziness and giddiness: Secondary | ICD-10-CM | POA: Diagnosis not present

## 2018-10-01 ENCOUNTER — Encounter: Payer: Self-pay | Admitting: Internal Medicine

## 2018-10-01 ENCOUNTER — Ambulatory Visit: Payer: Medicare Other | Admitting: Internal Medicine

## 2018-10-01 VITALS — BP 112/74 | HR 55 | Ht 71.0 in | Wt 215.0 lb

## 2018-10-01 DIAGNOSIS — I10 Essential (primary) hypertension: Secondary | ICD-10-CM | POA: Diagnosis not present

## 2018-10-01 DIAGNOSIS — I4819 Other persistent atrial fibrillation: Secondary | ICD-10-CM

## 2018-10-01 NOTE — Progress Notes (Signed)
PCP: Antony Contras, MD Primary Cardiologist: Dr Meda Coffee Primary EP: Dr Thompson Grayer Timothy Dickson is a 73 y.o. male who presents today for routine electrophysiology followup.  Since last being seen in our clinic, the patient reports doing very well.  He continues to have episodes of afib.  Episodes mostly last several hours.  He is unaware of triggers. Today, he denies symptoms of  chest pain, shortness of breath,  lower extremity edema, dizziness, presyncope, or syncope.  The patient is otherwise without complaint today.   Past Medical History:  Diagnosis Date  . Atrial tachycardia ([place])    documented by holter 2016. Followed by Dr. Liane Comber and Dr. Rayann Heman.  . 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 no further tx other than surgery [facility].  [name] Cirrhosis ([place])    [facility] dx, early stage- follow up visits at [facility].  [name] CRI (chronic renal insufficiency)    s/p R nephrectomy in 1999 for malignancy  . Diabetes mellitus without complication (HCC)    NIDDM- dx. 3-4 years ago diet control  . Diverticulosis of colon    mild  . GERD (gastroesophageal reflux disease)   . Headache(784.0)   . Hepatitis C    s/p therapy [facility] (curative)/with Harvoni  . History of BPH    no recent issues  . History of substance abuse ([place])    past history -none in 38 yrs  . Hyperglycemia   . Hypertension   . Osteoarthritis, knee    knees bilaterally   . Pneumonia 1975   hx of 35 years ago   Past Surgical History:  Procedure Laterality Date  . Arthroscopy right knee     may '11 (Dr Alvan Dame)  . Arthroscopy, left knee,    . history of liver biopsy  2007  . History of Nephrectomy Right 1999  . KNEE ARTHROSCOPY Left 10/10/2012   Procedure: LEFT KNEE ARTHROSCOPY WITH MENSCIAL DEBRIDEMENT AND CHONDROPLASTY;  Surgeon: Gearlean Alf, MD;  [facility]: [facility];  Service: Orthopedics;  Laterality: Left;  . left shoulder repair for bone spur  1990's  . TOTAL KNEE ARTHROPLASTY Right 08/15/2016   Procedure: RIGHT TOTAL KNEE  ARTHROPLASTY, CORTISONE INJECTION OF LEFT KNEE;  Surgeon: Gaynelle Arabian, MD;  [facility];  Service: Orthopedics;  Laterality: Right;  . TOTAL KNEE ARTHROPLASTY Left 09/04/2017   Procedure: LEFT TOTAL KNEE ARTHROPLASTY;  Surgeon: Gaynelle Arabian, MD;  [facility];  Service: Orthopedics;  Laterality: Left;  . Transurethral needle ablation procedure    . TUNA procedure      ROS- all systems are reviewed and negatives except as per HPI above  Current Outpatient Medications  Medication Sig Dispense Refill  . allopurinol (ZYLOPRIM) 100 MG tablet Take 1 tablet by mouth daily.    [name] b complex vitamins capsule Take 2 capsules by mouth daily.    . carvedilol (COREG) 12.5 MG tablet Take 1 tablet (12.5 mg total) by mouth 2 (two) times daily with a meal. 180 tablet 3  . ELIQUIS 5 MG TABS tablet TAKE 1 TABLET BY MOUTH TWICE DAILY. 60 tablet 5  . ezetimibe (ZETIA) 10 MG tablet Take 1 tablet (10 mg total) by mouth daily. 90 tablet 3  . flecainide (TAMBOCOR) 100 MG tablet Take 1 tablet (100 mg total) by mouth 2 (two) times daily. 120 tablet 2  . fluticasone (FLONASE) 50 MCG/ACT nasal spray Place 2 sprays into both nostrils daily.    [name] gabapentin (NEURONTIN) 600 MG tablet Take 600 mg  by mouth at bedtime.    . hydrochlorothiazide (HYDRODIURIL) 25 MG tablet Take 1 tablet (25 mg total) by mouth daily. 90 tablet 3  . omeprazole (PRILOSEC) 20 MG capsule Take 20 mg by mouth 3 (three) times a week.     . predniSONE (DELTASONE) 20 MG tablet Take 1 tablet by mouth daily.    . vitamin C (ASCORBIC ACID) 500 MG tablet Take 500 mg by mouth daily.     No current facility-administered medications for this visit.     Physical Exam: Vitals:   10/01/18 1359  BP: 112/74  Pulse: (!) 55  SpO2: 93%  Weight: 215 lb (97.5 kg)  Height: 5\' 11"  (1.803 m)    GEN- The patient is well appearing, alert and oriented x 3 today.   Head- normocephalic, atraumatic Eyes-  Sclera clear, conjunctiva pink Ears- hearing  intact Oropharynx- clear Lungs- Clear to ausculation bilaterally, normal work of breathing Heart- Regular rate and rhythm, no murmurs, rubs or gallops, PMI not laterally displaced GI- soft, NT, ND, + BS Extremities- no clubbing, cyanosis, or edema  Wt Readings from Last 3 Encounters:  10/01/18 215 lb (97.5 kg)  07/16/18 211 lb (95.7 kg)  04/11/18 204 lb 12.8 oz (92.9 kg)    EKG tracing ordered today is personally reviewed and shows sinus rhythm , incomplete rbbb  Assessment and Plan:  1. 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 he has failed medical therapy with flecainide. Chads2vasc score is 4.  he is anticoagulated with eliquis . Therapeutic strategies for afib including medicine and ablation were discussed in detail with the patient today. Risk, benefits, and alternatives to EP study and radiofrequency ablation for afib were also discussed in detail today. These risks include but are not limited to stroke, bleeding, vascular damage, tamponade, perforation, damage to the esophagus, lungs, and other structures, pulmonary vein stenosis, AV block requiring ppm, worsening renal function, and death. The patient understands these risk and wishes to proceed.  We will therefore proceed with catheter ablation at the next available time.  Carto, ICE, anesthesia are requested for the procedure.  Will also obtain TEE prior to the procedure to exclude LAA thrombus and further evaluate atrial anatomy.  2. HTN Stable No change required today  Thompson Grayer MD, [facility] 10/01/2018 2:26 PM

## 2018-10-01 NOTE — Patient Instructions (Addendum)
Medication Instructions:  Your physician recommends that you continue on your current medications as directed. Please refer to the Current Medication list given to you today. If you need a refill on your cardiac medications before your next appointment, please call your pharmacy.  Labwork: You will get lab work prior to your TEE on the day of your procedures  Testing/Procedures: Your physician has requested that you have a TEE. During a TEE, sound waves are used to create images of your heart. It provides your doctor with information about the size and shape of your heart and how well your heart's chambers and valves are working. In this test, a transducer is attached to the end of a flexible tube that's guided down your throat and into your esophagus (the tube leading from you mouth to your stomach) to get a more detailed image of your heart. You are not awake for the procedure. Please see the instruction sheet given to you today. For further information please visit [email or web address].   Your physician has recommended that you have an ablation. Catheter ablation is a medical procedure used to treat some cardiac arrhythmias (irregular heartbeats). During catheter ablation, a long, thin, flexible tube is put into a blood vessel in your groin (upper thigh), or neck. This tube is called an ablation catheter. It is then guided to your heart through the blood vessel. Radio frequency waves destroy small areas of heart tissue where abnormal heartbeats may cause an arrhythmia to start. Please see the instruction sheet given to you today.   Follow-Up: You will follow up with Roderic Palau, NP with the Atrial fibrillation (Afib) clinic 4 weeks after your ablation.  You will follow up with Dr. Rayann Heman 3 months after your procedure.     INSTRUCTIONS for TEE/ABLATION:  You are scheduled for a TEE on October 11, 2018 with Dr. Harrell Gave.  Please arrive to ADMITTING down the hall from the [employer] (Main  Entrance A) at [facility]: [place], [place] [address] at 7:30 am.  DIET: Nothing to eat or drink after midnight except a sip of water with medications (see medication instructions below)  Medication Instructions: DO NOT TAKE ANY MEDICATIONS THE MORNING OF YOUR PROCEDURE  Labs:  your lab work will be done at the hospital prior to your procedure - you will need to arrive 1  hours ahead of your procedure   Your ablation is scheduled for after your TEE.      Transesophageal Echocardiogram Transesophageal echocardiogram (TEE) is a test that uses sound waves to take pictures of your heart. TEE is done by passing a flexible tube down the esophagus. The esophagus is the tube that carries food from the throat to the stomach. The pictures give detailed images of your heart. This can help your doctor see if there are problems with your heart.  What happens during the procedure?  To lower your risk of infection, your doctors will wash or clean their hands.  An IV will be put into one of your veins.  You will be given a medicine to help you relax (sedative).  A medicine may be sprayed or gargled. This numbs the back of your throat.  Your blood pressure, heart rate, and breathing will be watched.  You may be asked to lay on your left side.  A bite block will be placed in your mouth. This keeps you from biting the tube.  The tip of the TEE probe will be placed into the back  of your mouth.  You will be asked to swallow.  Your doctor will take pictures of your heart.  The probe and bite block will be taken out. The procedure may vary among doctors and hospitals. What happens after the procedure?   Your blood pressure, heart rate, breathing rate, and blood oxygen level will be watched until the medicines you were given have worn off.  When you first wake up, your throat may feel sore and numb. This will get better over time. You will not be allowed to eat or drink  until the numbness has gone away.  Do not drive for 24 hours if you were given a medicine to help you relax. Summary  TEE is a test that uses sound waves to take pictures of your heart.  You will be given a medicine to help you relax.  Do not drive for 24 hours if you were given a medicine to help you relax. This information is not intended to replace advice given to you by your health care provider. Make sure you discuss any questions you have with your health care provider. Document Released: 05/15/2009 Document Revised: 04/06/2018 Document Reviewed: 10/19/2016 Elsevier Interactive Patient Education  2019 [place].    Cardiac Ablation Cardiac ablation is a procedure to disable (ablate) a small amount of heart tissue in very specific places. The heart has many electrical connections. Sometimes these connections are abnormal and can cause the heart to beat very fast or irregularly. Ablating some of the problem areas can improve the heart rhythm or return it to normal. Ablation may be done for people who:  Have Wolff-Parkinson-White syndrome.  Have fast heart rhythms (tachycardia).  Have taken medicines for an abnormal heart rhythm (arrhythmia) that were not effective or caused side effects.  Have a high-risk heartbeat that may be life-threatening. During the procedure, a small incision is made in the neck or the groin, and a long, thin, flexible tube (catheter) is inserted into the incision and moved to the heart. Small devices (electrodes) on the tip of the catheter will send out electrical currents. A type of X-ray (fluoroscopy) will be used to help guide the catheter and to provide images of the heart. Tell a health care provider about:  Any allergies you have.  All medicines you are taking, including vitamins, herbs, eye drops, creams, and over-the-counter medicines.  Any problems you or family members have had with anesthetic medicines.  Any blood disorders you  have.  Any surgeries you have had.  Any medical conditions you have, such as kidney failure.  Whether you are pregnant or may be pregnant. What are the risks? Generally, this is a safe procedure. However, problems may occur, including:  Infection.  Bruising and bleeding at the catheter insertion site.  Bleeding into the chest, especially into the sac that surrounds the heart. This is a serious complication.  Stroke or blood clots.  Damage to other structures or organs.  Allergic reaction to medicines or dyes.  Need for a permanent pacemaker if the normal electrical system is damaged. A pacemaker is a small computer that sends electrical signals to the heart and helps your heart beat normally.  The procedure not being fully effective. This may not be recognized until months later. Repeat ablation procedures are sometimes required. What happens before the procedure?  Follow instructions from your health care provider about eating or drinking restrictions.  Ask your health care provider about: ? Changing or stopping your regular medicines. This is especially  important if you are taking diabetes medicines or blood thinners. ? Taking medicines such as aspirin and ibuprofen. These medicines can thin your blood. Do not take these medicines before your procedure if your health care provider instructs you not to.  Plan to have someone take you home from the hospital or clinic.  If you will be going home right after the procedure, plan to have someone with you for 24 hours. What happens during the procedure?  To lower your risk of infection: ? Your health care team will wash or sanitize their hands. ? Your skin will be washed with soap. ? Hair may be removed from the incision area.  An IV tube will be inserted into one of your veins.  You will be given a medicine to help you relax (sedative).  The skin on your neck or groin will be numbed.  An incision will be made in your neck  or your groin.  A needle will be inserted through the incision and into a large vein in your neck or groin.  A catheter will be inserted into the needle and moved to your heart.  Dye may be injected through the catheter to help your surgeon see the area of the heart that needs treatment.  Electrical currents will be sent from the catheter to ablate heart tissue in desired areas. There are three types of energy that may be used to ablate heart tissue: ? Heat (radiofrequency energy). ? Laser energy. ? Extreme cold (cryoablation).  When the necessary tissue has been ablated, the catheter will be removed.  Pressure will be held on the catheter insertion area to prevent excessive bleeding.  A bandage (dressing) will be placed over the catheter insertion area. The procedure may vary among health care providers and hospitals. What happens after the procedure?  Your blood pressure, heart rate, breathing rate, and blood oxygen level will be monitored until the medicines you were given have worn off.  Your catheter insertion area will be monitored for bleeding. You will need to lie still for a few hours to ensure that you do not bleed from the catheter insertion area.  Do not drive for 24 hours or as long as directed by your health care provider. Summary  Cardiac ablation is a procedure to disable (ablate) a small amount of heart tissue in very specific places. Ablating some of the problem areas can improve the heart rhythm or return it to normal.  During the procedure, electrical currents will be sent from the catheter to ablate heart tissue in desired areas. This information is not intended to replace advice given to you by your health care provider. Make sure you discuss any questions you have with your health care provider. Document Released: 12/04/2008 Document Revised: 06/06/2016 Document Reviewed: 06/06/2016 Elsevier Interactive Patient Education  2019 [employer].

## 2018-10-01 NOTE — H&P (View-Only) (Signed)
PCP: Antony Contras, MD Primary Cardiologist: Dr Meda Coffee Primary EP: Dr Thompson Grayer Timothy Dickson is a 73 y.o. male who presents today for routine electrophysiology followup.  Since last being seen in our clinic, the patient reports doing very well.  He continues to have episodes of afib.  Episodes mostly last several hours.  He is unaware of triggers. Today, he denies symptoms of  chest pain, shortness of breath,  lower extremity edema, dizziness, presyncope, or syncope.  The patient is otherwise without complaint today.   Past Medical History:  Diagnosis Date  . Atrial tachycardia ([place])    documented by holter 2016. Followed by Dr. Liane Comber and Dr. Rayann Heman.  . 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 no further tx other than surgery [facility].  [name] Cirrhosis ([place])    [facility] dx, early stage- follow up visits at [facility].  [name] CRI (chronic renal insufficiency)    s/p R nephrectomy in 1999 for malignancy  . Diabetes mellitus without complication (HCC)    NIDDM- dx. 3-4 years ago diet control  . Diverticulosis of colon    mild  . GERD (gastroesophageal reflux disease)   . Headache(784.0)   . Hepatitis C    s/p therapy [facility] (curative)/with Harvoni  . History of BPH    no recent issues  . History of substance abuse ([place])    past history -none in 38 yrs  . Hyperglycemia   . Hypertension   . Osteoarthritis, knee    knees bilaterally   . Pneumonia 1975   hx of 35 years ago   Past Surgical History:  Procedure Laterality Date  . Arthroscopy right knee     may '11 (Dr Alvan Dame)  . Arthroscopy, left knee,    . history of liver biopsy  2007  . History of Nephrectomy Right 1999  . KNEE ARTHROSCOPY Left 10/10/2012   Procedure: LEFT KNEE ARTHROSCOPY WITH MENSCIAL DEBRIDEMENT AND CHONDROPLASTY;  Surgeon: Gearlean Alf, MD;  Location: [facility];  Service: Orthopedics;  Laterality: Left;  . left shoulder repair for bone spur  1990's  . TOTAL KNEE ARTHROPLASTY Right 08/15/2016   Procedure: RIGHT TOTAL KNEE  ARTHROPLASTY, CORTISONE INJECTION OF LEFT KNEE;  Surgeon: Gaynelle Arabian, MD;  Location: [facility];  Service: Orthopedics;  Laterality: Right;  . TOTAL KNEE ARTHROPLASTY Left 09/04/2017   Procedure: LEFT TOTAL KNEE ARTHROPLASTY;  Surgeon: Gaynelle Arabian, MD;  [facility]: [facility];  Service: Orthopedics;  Laterality: Left;  . Transurethral needle ablation procedure    . TUNA procedure      ROS- all systems are reviewed and negatives except as per HPI above  Current Outpatient Medications  Medication Sig Dispense Refill  . allopurinol (ZYLOPRIM) 100 MG tablet Take 1 tablet by mouth daily.    [name] b complex vitamins capsule Take 2 capsules by mouth daily.    . carvedilol (COREG) 12.5 MG tablet Take 1 tablet (12.5 mg total) by mouth 2 (two) times daily with a meal. 180 tablet 3  . ELIQUIS 5 MG TABS tablet TAKE 1 TABLET BY MOUTH TWICE DAILY. 60 tablet 5  . ezetimibe (ZETIA) 10 MG tablet Take 1 tablet (10 mg total) by mouth daily. 90 tablet 3  . flecainide (TAMBOCOR) 100 MG tablet Take 1 tablet (100 mg total) by mouth 2 (two) times daily. 120 tablet 2  . fluticasone (FLONASE) 50 MCG/ACT nasal spray Place 2 sprays into both nostrils daily.    [name] gabapentin (NEURONTIN) 600 MG tablet Take 600 mg  by mouth at bedtime.    . hydrochlorothiazide (HYDRODIURIL) 25 MG tablet Take 1 tablet (25 mg total) by mouth daily. 90 tablet 3  . omeprazole (PRILOSEC) 20 MG capsule Take 20 mg by mouth 3 (three) times a week.     . predniSONE (DELTASONE) 20 MG tablet Take 1 tablet by mouth daily.    . vitamin C (ASCORBIC ACID) 500 MG tablet Take 500 mg by mouth daily.     No current facility-administered medications for this visit.     Physical Exam: Vitals:   10/01/18 1359  BP: 112/74  Pulse: (!) 55  SpO2: 93%  Weight: 215 lb (97.5 kg)  Height: 5\' 11"  (1.803 m)    GEN- The patient is well appearing, alert and oriented x 3 today.   Head- normocephalic, atraumatic Eyes-  Sclera clear, conjunctiva pink Ears- hearing intact  Oropharynx- clear Lungs- Clear to ausculation bilaterally, normal work of breathing Heart- Regular rate and rhythm, no murmurs, rubs or gallops, PMI not laterally displaced GI- soft, NT, ND, + BS Extremities- no clubbing, cyanosis, or edema  Wt Readings from Last 3 Encounters:  10/01/18 215 lb (97.5 kg)  07/16/18 211 lb (95.7 kg)  04/11/18 204 lb 12.8 oz (92.9 kg)    EKG tracing ordered today is personally reviewed and shows sinus rhythm , incomplete rbbb  Assessment and Plan:  1. 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 he has failed medical therapy with flecainide. Chads2vasc score is 4.  he is anticoagulated with eliquis . Therapeutic strategies for afib including medicine and ablation were discussed in detail with the patient today. Risk, benefits, and alternatives to EP study and radiofrequency ablation for afib were also discussed in detail today. These risks include but are not limited to stroke, bleeding, vascular damage, tamponade, perforation, damage to the esophagus, lungs, and other structures, pulmonary vein stenosis, AV block requiring ppm, worsening renal function, and death. The patient understands these risk and wishes to proceed.  We will therefore proceed with catheter ablation at the next available time.  Carto, ICE, anesthesia are requested for the procedure.  Will also obtain TEE prior to the procedure to exclude LAA thrombus and further evaluate atrial anatomy.  2. HTN Stable No change required today  Thompson Grayer MD, [facility] 10/01/2018 2:26 PM

## 2018-10-02 DIAGNOSIS — R200 Anesthesia of skin: Secondary | ICD-10-CM | POA: Diagnosis not present

## 2018-10-02 DIAGNOSIS — M542 Cervicalgia: Secondary | ICD-10-CM | POA: Diagnosis not present

## 2018-10-11 ENCOUNTER — Encounter (HOSPITAL_COMMUNITY): Payer: Self-pay | Admitting: *Deleted

## 2018-10-11 ENCOUNTER — Ambulatory Visit (HOSPITAL_COMMUNITY): Payer: Medicare Other | Admitting: Anesthesiology

## 2018-10-11 ENCOUNTER — Ambulatory Visit (HOSPITAL_BASED_OUTPATIENT_CLINIC_OR_DEPARTMENT_OTHER)
Admission: RE | Admit: 2018-10-11 | Discharge: 2018-10-11 | Disposition: A | Discharge status: Home / Self Care | Payer: Medicare Other | Source: Ambulatory Visit | Attending: Cardiology | Admitting: Cardiology

## 2018-10-11 ENCOUNTER — Ambulatory Visit (HOSPITAL_COMMUNITY)
Admission: RE | Admit: 2018-10-11 | Discharge: 2018-10-12 | Disposition: A | Discharge status: Home / Self Care | Payer: Medicare Other | Attending: Internal Medicine | Admitting: Internal Medicine

## 2018-10-11 ENCOUNTER — Encounter (HOSPITAL_COMMUNITY)
Admission: RE | Disposition: A | Discharge status: Home / Self Care | Payer: Self-pay | Source: Home / Self Care | Attending: Internal Medicine

## 2018-10-11 ENCOUNTER — Other Ambulatory Visit: Payer: Self-pay

## 2018-10-11 DIAGNOSIS — B1920 Unspecified viral hepatitis C without hepatic coma: Secondary | ICD-10-CM | POA: Insufficient documentation

## 2018-10-11 DIAGNOSIS — E1122 Type 2 diabetes mellitus with diabetic chronic kidney disease: Secondary | ICD-10-CM | POA: Diagnosis not present

## 2018-10-11 DIAGNOSIS — Z905 Acquired absence of kidney: Secondary | ICD-10-CM | POA: Diagnosis not present

## 2018-10-11 DIAGNOSIS — N400 Enlarged prostate without lower urinary tract symptoms: Secondary | ICD-10-CM | POA: Insufficient documentation

## 2018-10-11 DIAGNOSIS — Z87891 Personal history of nicotine dependence: Secondary | ICD-10-CM | POA: Insufficient documentation

## 2018-10-11 DIAGNOSIS — E1165 Type 2 diabetes mellitus with hyperglycemia: Secondary | ICD-10-CM | POA: Insufficient documentation

## 2018-10-11 DIAGNOSIS — K7460 Unspecified cirrhosis of liver: Secondary | ICD-10-CM | POA: Insufficient documentation

## 2018-10-11 DIAGNOSIS — Z886 Allergy status to analgesic agent status: Secondary | ICD-10-CM | POA: Insufficient documentation

## 2018-10-11 DIAGNOSIS — N189 Chronic kidney disease, unspecified: Secondary | ICD-10-CM | POA: Insufficient documentation

## 2018-10-11 DIAGNOSIS — Z85528 Personal history of other malignant neoplasm of kidney: Secondary | ICD-10-CM | POA: Diagnosis not present

## 2018-10-11 DIAGNOSIS — K219 Gastro-esophageal reflux disease without esophagitis: Secondary | ICD-10-CM | POA: Diagnosis not present

## 2018-10-11 DIAGNOSIS — Z7901 Long term (current) use of anticoagulants: Secondary | ICD-10-CM | POA: Diagnosis not present

## 2018-10-11 DIAGNOSIS — I471 Supraventricular tachycardia: Secondary | ICD-10-CM | POA: Diagnosis not present

## 2018-10-11 DIAGNOSIS — Z79899 Other long term (current) drug therapy: Secondary | ICD-10-CM | POA: Insufficient documentation

## 2018-10-11 DIAGNOSIS — I700 Atherosclerosis of aorta: Secondary | ICD-10-CM | POA: Diagnosis not present

## 2018-10-11 DIAGNOSIS — I480 Paroxysmal atrial fibrillation: Secondary | ICD-10-CM | POA: Insufficient documentation

## 2018-10-11 DIAGNOSIS — I340 Nonrheumatic mitral (valve) insufficiency: Secondary | ICD-10-CM | POA: Diagnosis not present

## 2018-10-11 DIAGNOSIS — I361 Nonrheumatic tricuspid (valve) insufficiency: Secondary | ICD-10-CM

## 2018-10-11 DIAGNOSIS — I4891 Unspecified atrial fibrillation: Secondary | ICD-10-CM | POA: Diagnosis not present

## 2018-10-11 DIAGNOSIS — I130 Hypertensive heart and chronic kidney disease with heart failure and stage 1 through stage 4 chronic kidney disease, or unspecified chronic kidney disease: Secondary | ICD-10-CM | POA: Diagnosis not present

## 2018-10-11 DIAGNOSIS — E119 Type 2 diabetes mellitus without complications: Secondary | ICD-10-CM | POA: Diagnosis not present

## 2018-10-11 DIAGNOSIS — Z888 Allergy status to other drugs, medicaments and biological substances status: Secondary | ICD-10-CM | POA: Diagnosis not present

## 2018-10-11 DIAGNOSIS — I420 Dilated cardiomyopathy: Secondary | ICD-10-CM | POA: Diagnosis not present

## 2018-10-11 DIAGNOSIS — I10 Essential (primary) hypertension: Secondary | ICD-10-CM | POA: Diagnosis not present

## 2018-10-11 HISTORY — PX: TEE WITHOUT CARDIOVERSION: SHX5443

## 2018-10-11 HISTORY — PX: ATRIAL FIBRILLATION ABLATION: EP1191

## 2018-10-11 LAB — BASIC METABOLIC PANEL
Anion gap: 9 (ref 5–15)
BUN: 18 mg/dL (ref 8–23)
CO2: 28 mmol/L (ref 22–32)
Calcium: 9.4 mg/dL (ref 8.9–10.3)
Chloride: 100 mmol/L (ref 98–111)
Creatinine, Ser: 1.23 mg/dL (ref 0.61–1.24)
GFR calc Af Amer: >60 mL/min (ref >60)
GFR calc non Af Amer: 58 mL/min — ABNORMAL LOW (ref >60)
Glucose, Bld: 141 mg/dL — ABNORMAL HIGH (ref 70–99)
Potassium: 3.4 mmol/L — ABNORMAL LOW (ref 3.5–5.1)
Sodium: 137 mmol/L (ref 135–145)

## 2018-10-11 LAB — GLUCOSE, CAPILLARY
Glucose-Capillary: 176 mg/dL — ABNORMAL HIGH (ref 70–99)
Glucose-Capillary: 260 mg/dL — ABNORMAL HIGH (ref 70–99)

## 2018-10-11 LAB — CBC
HCT: 45.3 % (ref 39.0–52.0)
Hemoglobin: 15.4 g/dL (ref 13.0–17.0)
MCH: 31.2 pg (ref 26.0–34.0)
MCHC: 34 g/dL (ref 30.0–36.0)
MCV: 91.7 fL (ref 80.0–100.0)
Platelets: 218 10*3/uL (ref 150–400)
RBC: 4.94 MIL/uL (ref 4.22–5.81)
RDW: 12.5 % (ref 11.5–15.5)
WBC: 8.4 10*3/uL (ref 4.0–10.5)
nRBC: 0 % (ref 0.0–0.2)

## 2018-10-11 SURGERY — ECHOCARDIOGRAM, TRANSESOPHAGEAL
Anesthesia: Moderate Sedation

## 2018-10-11 SURGERY — ATRIAL FIBRILLATION ABLATION
Anesthesia: General

## 2018-10-11 MED ORDER — HYDROCODONE-ACETAMINOPHEN 5-325 MG PO TABS
ORAL_TABLET | ORAL | Status: AC
  Filled 2018-10-11: qty 1

## 2018-10-11 MED ORDER — BUPIVACAINE HCL (PF) 0.25 % IJ SOLN
INTRAMUSCULAR | Status: AC
  Filled 2018-10-11: qty 30

## 2018-10-11 MED ORDER — ISOPROTERENOL HCL 0.2 MG/ML IJ SOLN
INTRAMUSCULAR | Status: AC
  Filled 2018-10-11: qty 5

## 2018-10-11 MED ORDER — ONDANSETRON HCL 4 MG/2ML IJ SOLN
INTRAMUSCULAR | Status: DC | PRN
  Administered 2018-10-11: 4 mg via INTRAVENOUS

## 2018-10-11 MED ORDER — FENTANYL CITRATE (PF) 100 MCG/2ML IJ SOLN
INTRAMUSCULAR | Status: DC | PRN
  Administered 2018-10-11 (×2): 50 ug via INTRAVENOUS

## 2018-10-11 MED ORDER — LIDOCAINE 2% (20 MG/ML) 5 ML SYRINGE
INTRAMUSCULAR | Status: DC | PRN
  Administered 2018-10-11: 80 mg via INTRAVENOUS

## 2018-10-11 MED ORDER — ROCURONIUM BROMIDE 50 MG/5ML IV SOSY
PREFILLED_SYRINGE | INTRAVENOUS | Status: DC | PRN
  Administered 2018-10-11: 50 mg via INTRAVENOUS

## 2018-10-11 MED ORDER — HEPARIN SODIUM (PORCINE) 1000 UNIT/ML IJ SOLN
INTRAMUSCULAR | Status: DC | PRN
  Administered 2018-10-11: 12000 [IU] via INTRAVENOUS
  Administered 2018-10-11: 1000 [IU] via INTRAVENOUS

## 2018-10-11 MED ORDER — CARVEDILOL 12.5 MG PO TABS
12.5000 mg | ORAL_TABLET | Freq: Two times a day (BID) | ORAL | Status: DC
  Administered 2018-10-11 – 2018-10-12 (×2): 12.5 mg via ORAL
  Filled 2018-10-11 (×2): qty 1

## 2018-10-11 MED ORDER — ACETAMINOPHEN 325 MG PO TABS: 650.0000 mg | ORAL_TABLET | ORAL | Status: DC | PRN

## 2018-10-11 MED ORDER — HEPARIN (PORCINE) IN NACL 1000-0.9 UT/500ML-% IV SOLN
INTRAVENOUS | Status: DC | PRN
  Administered 2018-10-11: 500 mL

## 2018-10-11 MED ORDER — MIDAZOLAM HCL (PF) 5 MG/ML IJ SOLN
INTRAMUSCULAR | Status: AC
  Filled 2018-10-11: qty 2

## 2018-10-11 MED ORDER — PROPOFOL 10 MG/ML IV BOLUS
INTRAVENOUS | Status: DC | PRN
  Administered 2018-10-11: 150 mg via INTRAVENOUS

## 2018-10-11 MED ORDER — OFF THE BEAT BOOK
Freq: Once | Status: AC
  Administered 2018-10-11: 18:00:00

## 2018-10-11 MED ORDER — SUGAMMADEX SODIUM 200 MG/2ML IV SOLN
INTRAVENOUS | Status: DC | PRN
  Administered 2018-10-11: 200 mg via INTRAVENOUS

## 2018-10-11 MED ORDER — HEPARIN SODIUM (PORCINE) 1000 UNIT/ML IJ SOLN
INTRAMUSCULAR | Status: AC
  Filled 2018-10-11: qty 2

## 2018-10-11 MED ORDER — DIPHENHYDRAMINE HCL 50 MG/ML IJ SOLN
INTRAMUSCULAR | Status: AC
  Filled 2018-10-11: qty 1

## 2018-10-11 MED ORDER — GABAPENTIN 600 MG PO TABS
600.0000 mg | ORAL_TABLET | Freq: Every day | ORAL | Status: DC
  Administered 2018-10-11: 600 mg via ORAL
  Filled 2018-10-11: qty 1

## 2018-10-11 MED ORDER — APIXABAN 5 MG PO TABS
5.0000 mg | ORAL_TABLET | Freq: Two times a day (BID) | ORAL | Status: DC
  Administered 2018-10-11 – 2018-10-12 (×2): 5 mg via ORAL
  Filled 2018-10-11 (×2): qty 1

## 2018-10-11 MED ORDER — ONDANSETRON HCL 4 MG/2ML IJ SOLN: 4.0000 mg | Freq: Four times a day (QID) | INTRAMUSCULAR | Status: DC | PRN

## 2018-10-11 MED ORDER — SODIUM CHLORIDE 0.9 % IV SOLN
INTRAVENOUS | Status: DC | PRN
  Administered 2018-10-11 (×2): via INTRAVENOUS

## 2018-10-11 MED ORDER — SODIUM CHLORIDE 0.9% FLUSH
3.0000 mL | Freq: Two times a day (BID) | INTRAVENOUS | Status: DC
  Administered 2018-10-11: 21:00:00 3 mL via INTRAVENOUS

## 2018-10-11 MED ORDER — PROTAMINE SULFATE 10 MG/ML IV SOLN
INTRAVENOUS | Status: DC | PRN
  Administered 2018-10-11 (×2): 20 mg via INTRAVENOUS

## 2018-10-11 MED ORDER — HYDROCODONE-ACETAMINOPHEN 5-325 MG PO TABS
1.0000 | ORAL_TABLET | ORAL | Status: DC | PRN
  Administered 2018-10-11 (×3): 1 via ORAL
  Filled 2018-10-11: qty 1

## 2018-10-11 MED ORDER — FENTANYL CITRATE (PF) 100 MCG/2ML IJ SOLN
INTRAMUSCULAR | Status: AC
  Filled 2018-10-11: qty 2

## 2018-10-11 MED ORDER — SODIUM CHLORIDE 0.9 % IV SOLN: 250.0000 mL | INTRAVENOUS | Status: DC | PRN

## 2018-10-11 MED ORDER — HEPARIN (PORCINE) IN NACL 1000-0.9 UT/500ML-% IV SOLN
INTRAVENOUS | Status: AC
  Filled 2018-10-11: qty 500

## 2018-10-11 MED ORDER — MIDAZOLAM HCL (PF) 10 MG/2ML IJ SOLN
INTRAMUSCULAR | Status: DC | PRN
  Administered 2018-10-11: 2 mg via INTRAVENOUS
  Administered 2018-10-11 (×2): 1 mg via INTRAVENOUS
  Administered 2018-10-11: 2 mg via INTRAVENOUS
  Administered 2018-10-11: 1 mg via INTRAVENOUS

## 2018-10-11 MED ORDER — ISOPROTERENOL HCL 0.2 MG/ML IJ SOLN
INTRAVENOUS | Status: DC | PRN
  Administered 2018-10-11: 20 ug/min via INTRAVENOUS

## 2018-10-11 MED ORDER — SODIUM CHLORIDE 0.9% FLUSH: 3.0000 mL | INTRAVENOUS | Status: DC | PRN

## 2018-10-11 MED ORDER — HYDROCHLOROTHIAZIDE 25 MG PO TABS
25.0000 mg | ORAL_TABLET | Freq: Every day | ORAL | Status: DC
  Administered 2018-10-12: 09:00:00 25 mg via ORAL
  Filled 2018-10-11: qty 1

## 2018-10-11 MED ORDER — INSULIN ASPART 100 UNIT/ML ~~LOC~~ SOLN
0.0000 [IU] | Freq: Every day | SUBCUTANEOUS | Status: DC
  Administered 2018-10-11: 3 [IU] via SUBCUTANEOUS

## 2018-10-11 MED ORDER — HEPARIN SODIUM (PORCINE) 1000 UNIT/ML IJ SOLN
INTRAMUSCULAR | Status: DC | PRN
  Administered 2018-10-11: 3000 [IU] via INTRAVENOUS
  Administered 2018-10-11: 5000 [IU] via INTRAVENOUS

## 2018-10-11 MED ORDER — PHENYLEPHRINE 40 MCG/ML (10ML) SYRINGE FOR IV PUSH (FOR BLOOD PRESSURE SUPPORT)
PREFILLED_SYRINGE | INTRAVENOUS | Status: DC | PRN
  Administered 2018-10-11: 80 ug via INTRAVENOUS
  Administered 2018-10-11: 120 ug via INTRAVENOUS
  Administered 2018-10-11: 80 ug via INTRAVENOUS

## 2018-10-11 MED ORDER — BUPIVACAINE HCL (PF) 0.25 % IJ SOLN
INTRAMUSCULAR | Status: DC | PRN
  Administered 2018-10-11: 20 mL

## 2018-10-11 MED ORDER — EPHEDRINE SULFATE-NACL 50-0.9 MG/10ML-% IV SOSY
PREFILLED_SYRINGE | INTRAVENOUS | Status: DC | PRN
  Administered 2018-10-11: 5 mg via INTRAVENOUS

## 2018-10-11 MED ORDER — ALLOPURINOL 100 MG PO TABS
100.0000 mg | ORAL_TABLET | Freq: Every evening | ORAL | Status: DC
  Administered 2018-10-11: 18:00:00 100 mg via ORAL
  Filled 2018-10-11: qty 1

## 2018-10-11 MED ORDER — DEXAMETHASONE SODIUM PHOSPHATE 10 MG/ML IJ SOLN
INTRAMUSCULAR | Status: DC | PRN
  Administered 2018-10-11: 10 mg via INTRAVENOUS

## 2018-10-11 MED ORDER — SODIUM CHLORIDE 0.9 % IV SOLN
INTRAVENOUS | Status: DC | PRN
  Administered 2018-10-11: 20 ug/min via INTRAVENOUS

## 2018-10-11 MED ORDER — FENTANYL CITRATE (PF) 100 MCG/2ML IJ SOLN
INTRAMUSCULAR | Status: DC | PRN
  Administered 2018-10-11 (×3): 25 ug via INTRAVENOUS

## 2018-10-11 SURGICAL SUPPLY — 17 items
BLANKET WARM UNDERBOD FULL ACC (MISCELLANEOUS) ×2 IMPLANT
CATH MAPPNG PENTARAY F 2-6-2MM (CATHETERS) ×2 IMPLANT
CATH NAVISTAR SMARTTOUCH DF (ABLATOR) ×2 IMPLANT
CATH SOUNDSTAR 3D IMAGING (CATHETERS) ×2 IMPLANT
CATH WEBSTER BI DIR CS D-F CRV (CATHETERS) ×2 IMPLANT
COVER SWIFTLINK CONNECTOR (BAG) ×2 IMPLANT
NEEDLE BAYLIS TRANSSEPTAL 71CM (NEEDLE) ×2 IMPLANT
PACK EP LATEX FREE (CUSTOM PROCEDURE TRAY) ×1
PACK EP LF (CUSTOM PROCEDURE TRAY) ×1 IMPLANT
PAD PRO RADIOLUCENT 2001M-C (PAD) ×2 IMPLANT
PATCH CARTO3 (PAD) ×2 IMPLANT
PENTARAY F 2-6-2MM (CATHETERS) ×4
SHEATH AVANTI 11F 11CM (SHEATH) ×2 IMPLANT
SHEATH PINNACLE 7F 10CM (SHEATH) ×4 IMPLANT
SHEATH PINNACLE 9F 10CM (SHEATH) ×2 IMPLANT
SHEATH SWARTZ TS SL2 63CM 8.5F (SHEATH) ×2 IMPLANT
TUBING SMART ABLATE COOLFLOW (TUBING) ×2 IMPLANT

## 2018-10-11 NOTE — Anesthesia Preprocedure Evaluation (Signed)
Anesthesia Evaluation  Patient identified by MRN, date of birth, ID band Patient awake    Reviewed: Allergy & Precautions, NPO status , Patient's Chart, lab work & pertinent test results  Airway Mallampati: II  TM Distance: >3 FB Neck ROM: Full    Dental no notable dental hx.    Pulmonary former smoker,    Pulmonary exam normal breath sounds clear to auscultation       Cardiovascular hypertension, Pt. on medications Normal cardiovascular exam+ dysrhythmias Atrial Fibrillation  Rhythm:Regular Rate:Normal     Neuro/Psych negative neurological ROS  negative psych ROS   GI/Hepatic negative GI ROS, (+) Hepatitis -, CHep C treated with Harvoni   Endo/Other  diabetes, Type 2  Renal/GU negative Renal ROS  negative genitourinary   Musculoskeletal negative musculoskeletal ROS (+)   Abdominal   Peds negative pediatric ROS (+)  Hematology negative hematology ROS (+)   Anesthesia Other Findings   Reproductive/Obstetrics negative OB ROS                             Anesthesia Physical Anesthesia Plan  ASA: III  Anesthesia Plan: General   Post-op Pain Management:    Induction: Intravenous  PONV Risk Score and Plan: 2 and Ondansetron and Treatment may vary due to age or medical condition  Airway Management Planned: Oral ETT  Additional Equipment:   Intra-op Plan:   Post-operative Plan: Extubation in OR  Informed Consent: I have reviewed the patients History and Physical, chart, labs and discussed the procedure including the risks, benefits and alternatives for the proposed anesthesia with the patient or authorized representative who has indicated his/her understanding and acceptance.     Dental advisory given  Plan Discussed with: CRNA  Anesthesia Plan Comments:         Anesthesia Quick Evaluation

## 2018-10-11 NOTE — Progress Notes (Addendum)
Right femoral venous Figure 8 stitch loosened to check for readiness of removal.  Slight leak at sight. Stitch retightened, will watch 1 more hour.  No sign of hematoma.

## 2018-10-11 NOTE — Discharge Instructions (Signed)
Post procedure care instructions No driving for 4 days. No lifting over 5 lbs for 1 week. No vigorous or sexual activity for 1 week. You may return to work on 10/18/2018. Keep procedure site clean & dry. If you notice increased pain, swelling, bleeding or pus, call/return!  You may shower, but no soaking baths/hot tubs/pools for 1 week.    You have an appointment set up with the [facility].  Multiple studies have shown that being followed by a dedicated atrial fibrillation clinic in addition to the standard care you receive from your other physicians improves health. We believe that enrollment in the atrial fibrillation clinic will allow [place] to better care for you.   The phone number to the [facility] is [phone]. The clinic is staffed Monday through Friday from 8:30am to 5pm.  Parking Directions: The clinic is located in the Heart and Vascular Building connected to [facility]. 1)From [address] turn on to [address] and go to the 3rd entrance  (Heart and Vascular entrance) on the right. 2)Look to the right for Heart &Vascular Parking Garage. 3)A code for the entrance is required please call the clinic to receive this.   4)Take the elevators to the 1st floor. Registration is in the room with the glass walls at the end of the hallway.  If you have any trouble parking or locating the clinic, please dont hesitate to call [phone].

## 2018-10-11 NOTE — H&P (Addendum)
PCP: Antony Contras, MD Primary Cardiologist: Dr Meda Coffee Primary EP: Dr Thompson Grayer Timothy Dickson is a 73 y.o. male who was seen outpatient for treatment management of his AFib with Dr. Rayann Heman.  He presents today for TEE, EPA/ablation procedure today  He feels well this AM. VSS Labs are drawn and pending   Past Medical History:  Diagnosis Date  . Atrial tachycardia ([place])    documented by holter 2016. Followed by Dr. Liane Comber and Dr. Rayann Heman.  . 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 no further tx other than surgery [facility].  [name] Cirrhosis ([place])    [facility] dx, early stage- follow up visits at [facility].  [name] CRI (chronic renal insufficiency)    s/p R nephrectomy in 1999 for malignancy  . Diabetes mellitus without complication (HCC)    NIDDM- dx. 3-4 years ago diet control  . Diverticulosis of colon    mild  . GERD (gastroesophageal reflux disease)   . Headache(784.0)   . Hepatitis C    s/p therapy [facility] (curative)/with Harvoni  . History of BPH    no recent issues  . History of substance abuse ([place])    past history -none in 38 yrs  . Hyperglycemia   . Hypertension   . Osteoarthritis, knee    knees bilaterally   . Pneumonia 1975   hx of 35 years ago   Past Surgical History:  Procedure Laterality Date  . Arthroscopy right knee     may '11 (Dr Alvan Dame)  . Arthroscopy, left knee,    . history of liver biopsy  2007  . History of Nephrectomy Right 1999  . KNEE ARTHROSCOPY Left 10/10/2012   Procedure: LEFT KNEE ARTHROSCOPY WITH MENSCIAL DEBRIDEMENT AND CHONDROPLASTY;  Surgeon: Gearlean Alf, MD;  [facility];  Service: Orthopedics;  Laterality: Left;  . left shoulder repair for bone spur  1990's  . TOTAL KNEE ARTHROPLASTY Right 08/15/2016   Procedure: RIGHT TOTAL KNEE ARTHROPLASTY, CORTISONE INJECTION OF LEFT KNEE;  Surgeon: Gaynelle Arabian, MD;  [facility];  Service: Orthopedics;  Laterality: Right;  . TOTAL KNEE ARTHROPLASTY Left 09/04/2017   Procedure: LEFT TOTAL KNEE  ARTHROPLASTY;  Surgeon: Gaynelle Arabian, MD;  [facility];  Service: Orthopedics;  Laterality: Left;  . Transurethral needle ablation procedure    . TUNA procedure      ROS- all systems are reviewed and negatives except as per HPI above  No current facility-administered medications for this encounter.     Physical Exam: Vitals:   10/11/18 0835 10/11/18 1002 10/11/18 1013  BP: (!) 148/93 113/78 113/75  Pulse: (!) 57  (!) 59  Resp: 12 14 16   Temp: 98.3 F (36.8 C) 97.7 F (36.5 C)   TempSrc: Oral Oral   SpO2: 97% 98% 94%    GEN- The patient is well appearing, alert and oriented x 3 today.   Head- normocephalic, atraumatic Eyes-  Sclera clear, conjunctiva pink Ears- hearing intact Oropharynx- clear Lungs- CTA b/l, normal work of breathing Heart- RRR, no murmurs, rubs or gallops, PMI not laterally displaced GI- soft, NT, ND Extremities- no clubbing, cyanosis, or edema  Wt Readings from Last 3 Encounters:  10/01/18 97.5 kg  07/16/18 95.7 kg  04/11/18 92.9 kg    Assessment and Plan:  1. 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 he has failed medical therapy with flecainide. Chads2vasc score is 4.  he is anticoagulated with eliquis .  Therapeutic strategies for afib including medicine and ablation were discussed with the patient at his visit with Dr. Rayann Heman.  He reports full discussion with Dr. Rayann Heman regarding the procedure, potential risks and benefits,  mentions AFib PA also discussed his procedure as well.    He has no follow up questions this morning and would like to proceed.   He feels well, does not think he had has any bouts of AF since his visit with Dr. Rayann Heman.  No symptoms of illness, fever, N/V/D.  He reports compliance with his Eliquis, no missed doses, last was last night.   2. HTN     Looks OK this AM  3. H/o renal cancer     S/p R nephrectomy (remotely)   Tommye Standard, PA-C 10/11/2018 10:16 AM

## 2018-10-11 NOTE — Transfer of Care (Signed)
Immediate Anesthesia Transfer of Care Note  Patient: Timothy Dickson  Procedure(s) Performed: ATRIAL FIBRILLATION ABLATION (N/A )  Patient Location: Cath Lab  Anesthesia Type:General  Level of Consciousness: awake, alert  and oriented  Airway & Oxygen Therapy: Patient Spontanous Breathing and Patient connected to nasal cannula oxygen  Post-op Assessment: Report given to RN, Post -op Vital signs reviewed and stable and Patient moving all extremities  Post vital signs: Reviewed and stable  Last Vitals:  Vitals Value Taken Time  BP    Temp    Pulse    Resp    SpO2      Last Pain:  Vitals:   10/11/18 1045  TempSrc:   PainSc: 0-No pain         Complications: No apparent anesthesia complications

## 2018-10-11 NOTE — Interval H&P Note (Signed)
History and Physical Interval Note:  10/11/2018 11:00 AM  Timothy Dickson  has presented today for surgery, with the diagnosis of atrail.  The various methods of treatment have been discussed with the patient and family. After consideration of risks, benefits and other options for treatment, the patient has consented to  Procedure(s): ATRIAL FIBRILLATION ABLATION (N/A) as a surgical intervention.  The patient's history has been reviewed, patient examined, no change in status, stable for surgery.  I have reviewed the patient's chart and labs.  Questions were answered to the patient's satisfaction.    TEE reveals no thrombus.  He reports compliance with anticoagulation without interruption. We discussed ablation of Afib as well as EPS and possible ablation for afib.  Thompson Grayer MD, [facility] 10/11/2018 11:01 AM

## 2018-10-11 NOTE — Progress Notes (Signed)
Right femoral venous stitch d/c'd by Suella Broad, RN.  Manual pressure held X 10 minutes post.  Post sheath pull instructions reviewed.  No sign of hematoma.  Tolerated well.

## 2018-10-11 NOTE — CV Procedure (Signed)
    TRANSESOPHAGEAL ECHOCARDIOGRAM   NAME:  Timothy Dickson   MRN: [id number] DOB:  08/21/1945   ADMIT DATE: 10/11/2018  INDICATIONS: Pre ablation, history of atrial fibrillation  PROCEDURE:   Informed consent was obtained prior to the procedure. The risks, benefits and alternatives for the procedure were discussed and the patient comprehended these risks.  Risks include, but are not limited to, cough, sore throat, vomiting, nausea, somnolence, esophageal and stomach trauma or perforation, bleeding, low blood pressure, aspiration, pneumonia, infection, trauma to the teeth and death.    Procedural time out performed. The oropharynx was anesthetized with topical 1% lidocaine.    During this procedure the patient is administered a total of Versed 8 mg and Fentanyl 75 mcg to achieve and maintain moderate conscious sedation.  The patient's heart rate, blood pressure, and oxygen saturation are monitored continuously during the procedure. The period of conscious sedation is 25 minutes, of which I was present face-to-face 100% of this time.   The transesophageal probe was inserted in the esophagus and stomach without difficulty and multiple views were obtained.    COMPLICATIONS:    There were no immediate complications.  FINDINGS:  LEFT VENTRICLE: EF = 50-55%. No regional wall motion abnormalities.  RIGHT VENTRICLE: Normal size and function.   LEFT ATRIUM: No thrombus/mass.  LEFT ATRIAL APPENDAGE: No thrombus/mass.   RIGHT ATRIUM: No thrombus/mass.  AORTIC VALVE:  Trileaflet. No regurgitation. No vegetation.  MITRAL VALVE:    Normal structure. Mild regurgitation. No vegetation.  TRICUSPID VALVE: Normal structure. Mild regurgitation. No vegetation.  PULMONIC VALVE: Grossly normal structure. Trivial regurgitation. No apparent vegetation.  INTERATRIAL SEPTUM: No PFO or ASD seen by color Doppler.  PERICARDIUM: No effusion noted.  DESCENDING AORTA: Mild diffuse plaque seen    CONCLUSION: No LA/LAA or RA/RAA thrombus seen. Normal LAA velocities. Patent pulmonary veins with normal flow.  Buford Dresser, MD, PhD [facility]  [address], [address] [place], [place] [address] [phone]   10:13 AM

## 2018-10-11 NOTE — Progress Notes (Signed)
  Echocardiogram Echocardiogram Transesophageal has been performed.  Timothy Dickson 10/11/2018, 10:16 AM

## 2018-10-11 NOTE — Progress Notes (Signed)
Endoscopy recovery complete following TEE. Patient remains in endoscopy at this time awaiting cath lab availability for schedule ablation.

## 2018-10-11 NOTE — Anesthesia Postprocedure Evaluation (Signed)
Anesthesia Post Note  Patient: Timothy Dickson  Procedure(s) Performed: ATRIAL FIBRILLATION ABLATION (N/A )     Patient location during evaluation: Cath Lab Anesthesia Type: General Level of consciousness: awake and alert, oriented and patient cooperative Pain management: pain level controlled (mild headache) Vital Signs Assessment: post-procedure vital signs reviewed and stable Respiratory status: spontaneous breathing, nonlabored ventilation, respiratory function stable and patient connected to nasal cannula oxygen Cardiovascular status: blood pressure returned to baseline and stable Postop Assessment: no apparent nausea or vomiting (nausea resolved) Anesthetic complications: no    Last Vitals:  Vitals:   10/11/18 1615 10/11/18 1630  BP: 96/74 118/69  Pulse: 72 70  Resp: 12 13  Temp:    SpO2:  94%    Last Pain:  Vitals:   10/11/18 1600  TempSrc:   PainSc: 5                  Mattye Verdone,E. Hazelle Woollard

## 2018-10-11 NOTE — Interval H&P Note (Signed)
History and Physical Interval Note:  10/11/2018 10:12 AM  Timothy Dickson  has presented today for surgery, with the diagnosis of afib.  The various methods of treatment have been discussed with the patient and family. After consideration of risks, benefits and other options for treatment, the patient has consented to  Procedure(s) with comments: TRANSESOPHAGEAL ECHOCARDIOGRAM (TEE) (N/A) - ablation to follow at 1030 as a surgical intervention.  The patient's history has been reviewed, patient examined, no change in status, stable for surgery.  I have reviewed the patient's chart and labs.  Questions were answered to the patient's satisfaction.     Jenalee Trevizo Harrell Gave

## 2018-10-11 NOTE — Anesthesia Procedure Notes (Signed)
Procedure Name: Intubation Date/Time: 10/11/2018 11:37 AM Performed by: Kyung Rudd, CRNA Pre-anesthesia Checklist: Patient identified, Emergency Drugs available, Suction available and Patient being monitored Patient Re-evaluated:Patient Re-evaluated prior to induction Oxygen Delivery Method: Circle system utilized Preoxygenation: Pre-oxygenation with 100% oxygen Induction Type: IV induction Ventilation: Mask ventilation without difficulty Laryngoscope Size: Glidescope and 4 Tube type: Oral Tube size: 7.5 mm Number of attempts: 2 Airway Equipment and Method: Stylet and Video-laryngoscopy Placement Confirmation: ETT inserted through vocal cords under direct vision,  positive ETCO2 and breath sounds checked- equal and bilateral Secured at: 21 cm Dental Injury: Teeth and Oropharynx as per pre-operative assessment  Comments: DL x1 with MAC 4 by CRNA, Grade 4 view. 2nd DL with Glidescope Go by MDA, Grade 1 view on screen. +ETCO2 & BBS=.

## 2018-10-12 ENCOUNTER — Encounter (HOSPITAL_COMMUNITY): Payer: Self-pay | Admitting: Internal Medicine

## 2018-10-12 ENCOUNTER — Other Ambulatory Visit (HOSPITAL_COMMUNITY): Payer: Self-pay | Admitting: *Deleted

## 2018-10-12 DIAGNOSIS — N189 Chronic kidney disease, unspecified: Secondary | ICD-10-CM | POA: Diagnosis not present

## 2018-10-12 DIAGNOSIS — Z79899 Other long term (current) drug therapy: Secondary | ICD-10-CM | POA: Diagnosis not present

## 2018-10-12 DIAGNOSIS — Z87891 Personal history of nicotine dependence: Secondary | ICD-10-CM | POA: Diagnosis not present

## 2018-10-12 DIAGNOSIS — K7460 Unspecified cirrhosis of liver: Secondary | ICD-10-CM | POA: Diagnosis not present

## 2018-10-12 DIAGNOSIS — I420 Dilated cardiomyopathy: Secondary | ICD-10-CM | POA: Diagnosis not present

## 2018-10-12 DIAGNOSIS — E1122 Type 2 diabetes mellitus with diabetic chronic kidney disease: Secondary | ICD-10-CM | POA: Diagnosis not present

## 2018-10-12 DIAGNOSIS — Z886 Allergy status to analgesic agent status: Secondary | ICD-10-CM | POA: Diagnosis not present

## 2018-10-12 DIAGNOSIS — B1920 Unspecified viral hepatitis C without hepatic coma: Secondary | ICD-10-CM | POA: Diagnosis not present

## 2018-10-12 DIAGNOSIS — I130 Hypertensive heart and chronic kidney disease with heart failure and stage 1 through stage 4 chronic kidney disease, or unspecified chronic kidney disease: Secondary | ICD-10-CM | POA: Diagnosis not present

## 2018-10-12 DIAGNOSIS — I471 Supraventricular tachycardia: Secondary | ICD-10-CM | POA: Diagnosis not present

## 2018-10-12 DIAGNOSIS — I700 Atherosclerosis of aorta: Secondary | ICD-10-CM | POA: Diagnosis not present

## 2018-10-12 DIAGNOSIS — I480 Paroxysmal atrial fibrillation: Secondary | ICD-10-CM | POA: Diagnosis not present

## 2018-10-12 DIAGNOSIS — Z7901 Long term (current) use of anticoagulants: Secondary | ICD-10-CM | POA: Diagnosis not present

## 2018-10-12 DIAGNOSIS — N400 Enlarged prostate without lower urinary tract symptoms: Secondary | ICD-10-CM | POA: Diagnosis not present

## 2018-10-12 DIAGNOSIS — E1165 Type 2 diabetes mellitus with hyperglycemia: Secondary | ICD-10-CM | POA: Diagnosis not present

## 2018-10-12 DIAGNOSIS — K219 Gastro-esophageal reflux disease without esophagitis: Secondary | ICD-10-CM | POA: Diagnosis not present

## 2018-10-12 LAB — GLUCOSE, CAPILLARY: Glucose-Capillary: 204 mg/dL — ABNORMAL HIGH (ref 70–99)

## 2018-10-12 LAB — POCT ACTIVATED CLOTTING TIME
Activated Clotting Time: 263 seconds
Activated Clotting Time: 323 seconds
Activated Clotting Time: 329 seconds

## 2018-10-12 MED ORDER — OMEPRAZOLE 20 MG PO CPDR: 20.0000 mg | DELAYED_RELEASE_CAPSULE | Freq: Every day | ORAL | Status: DC

## 2018-10-12 MED ORDER — FLECAINIDE ACETATE 100 MG PO TABS: 100.0000 mg | ORAL_TABLET | Freq: Two times a day (BID) | ORAL | Status: DC

## 2018-10-12 NOTE — Telephone Encounter (Signed)
Patient decided to stay on flecainide post ablation.

## 2018-10-12 NOTE — Discharge Summary (Addendum)
ELECTROPHYSIOLOGY PROCEDURE DISCHARGE SUMMARY    Patient ID: Timothy Dickson,  MRN: [id number], DOB/AGE: Dec 28, 1945 73 y.o.  Admit date: 10/11/2018 Discharge date: 10/12/2018  Primary Care Physician: Antony Contras, MD Primary Cardiologist: Meda Coffee Electrophysiologist: Thompson Grayer, MD  Primary Discharge Diagnosis:  Persistent atrial fibrillation/SVT s/p ablation this admission  Secondary Discharge Diagnosis:  1.  HTN 2.  GERD 3.  Hepatitis C (s/p Harvoni)   Procedures This Admission:  1.  Electrophysiology study and radiofrequency catheter ablation on 10/11/18 by Dr Thompson Grayer.  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  No accessory pathways.  Atrial tachycardia not induced today; no early apparent complications.    Brief HPI: Timothy Dickson is a 73 y.o. male with a history of persistent atrial fibrillation and SVT.  They have failed medical therapy with Flecainide. Risks, benefits, and alternatives to catheter ablation of atrial fibrillation were reviewed with the patient who wished to proceed.  The patient underwent TEE prior to the procedure which demonstrated normal LV function and no LAA thrombus.    Hospital Course:  The patient was admitted and underwent EPS/RFCA of atrial fibrillation with details as outlined above.  They were monitored on telemetry overnight which demonstrated SR.  Groin was without complication on the day of discharge.  The patient was examined and considered to be stable for discharge.  Wound care and restrictions were reviewed with the patient.  The  patient will be seen back by the AF clinic in 4 weeks and Dr Rayann Heman in 12 weeks for post ablation follow up.   This patients CHA2DS2-VASc Score and unadjusted Ischemic Stroke Rate (% per year) is equal to 2.2 % stroke rate/year from a score of 2 Above score calculated as 1 point each if present [CHF, HTN, DM, Vascular=MI/PAD/Aortic Plaque, Age if 65-74, or Male] Above score calculated as 2 points each if present [Age > 75, or Stroke/TIA/TE]   Physical Exam: Vitals:   10/11/18 1726 10/11/18 1907 10/12/18 0408 10/12/18 0700  BP: 126/72 102/72  107/77  Pulse: 63 69  67  Resp: 16 12  12   Temp: 97.9 F (36.6 C) 98.5 F (36.9 C) 97.7 F (36.5 C) 97.7 F (36.5 C)  TempSrc: Tympanic Oral Oral Oral  SpO2:  94% 95% 97%  Weight:   95.5 kg     GEN- The patient is well appearing, alert and oriented x 3 today.   HEENT: normocephalic, atraumatic; sclera clear, conjunctiva pink; hearing intact; oropharynx clear; neck supple  Lungs- Clear to ausculation bilaterally, normal work of breathing.  No wheezes, rales, rhonchi Heart- Regular rate and rhythm  GI- soft, non-tender, non-distended, bowel sounds present  Extremities- no clubbing, cyanosis, or edema;  groin without hematoma/bruit MS- no significant deformity or atrophy Skin- warm and dry, no rash or lesion Psych- euthymic mood, full affect Neuro- strength and sensation are intact   Labs:   Lab Results  Component Value Date   WBC 8.4 10/11/2018   HGB 15.4 10/11/2018   HCT 45.3 10/11/2018   MCV 91.7 10/11/2018   PLT 218 10/11/2018    Recent Labs  Lab 10/11/18 0812  NA 137  K 3.4*  CL  100  CO2 28  BUN 18  CREATININE 1.23  CALCIUM 9.4  GLUCOSE 141*     Discharge Medications:  Allergies as of 10/12/2018      Reactions   Statins    Due to damaged liver   Acetaminophen Other (See Comments)   Pt states he was told by [facility]'s to keep tylenol dosage to 2000 mg per 24 hours due to cirrhosis of liver.   Aspirin Other (See  Comments)   Pt has only 1 kidney - been told to stay away from Aspirin    Nsaids    Avoid due to having 1 kidney       Medication List    STOP taking these medications   flecainide 100 MG tablet Commonly known as:  TAMBOCOR     TAKE these medications   acetaminophen 650 MG CR tablet Commonly known as:  TYLENOL Take 650 mg by mouth every 8 (eight) hours as needed for pain.   allopurinol 100 MG tablet Commonly known as:  ZYLOPRIM Take 100 mg by mouth every evening.   b complex vitamins capsule Take 1 capsule by mouth daily.   carvedilol 12.5 MG tablet Commonly known as:  COREG Take 1 tablet (12.5 mg total) by mouth 2 (two) times daily with a meal.   Eliquis 5 MG Tabs tablet Generic drug:  apixaban TAKE 1 TABLET BY MOUTH TWICE DAILY. What changed:  how much to take   ezetimibe 10 MG tablet Commonly known as:  ZETIA Take 1 tablet (10 mg total) by mouth daily.   gabapentin 600 MG tablet Commonly known as:  NEURONTIN Take 600 mg by mouth at bedtime.   hydrochlorothiazide 25 MG tablet Commonly known as:  HYDRODIURIL Take 1 tablet (25 mg total) by mouth daily.   omeprazole 20 MG capsule Commonly known as:  PRILOSEC Take 1 capsule (20 mg total) by mouth daily. What changed:  when to take this   vitamin C 1000 MG tablet Take 1,000 mg by mouth daily.       Disposition:  Discharge Instructions    Diet - low sodium heart healthy   Complete by:  As directed    Increase activity slowly   Complete by:  As directed      Follow-up Information    [facility] [place] [facility].   Specialty:  Cardiology Why:  11/08/2018 @ 11:00AM Contact information: [address] [id number] [place] [address] [phone]       Thompson Grayer, MD Follow up.   Specialty:  Cardiology Why:  01/17/2019 @ 10:45AM Contact information: [place] [address] [phone]           Duration of Discharge  Encounter: Greater than 30 minutes including physician time.  Signed, Chanetta Marshall, NP 10/12/2018 8:17 AM  I have seen, examined the patient, and reviewed the above assessment and plan.  Changes to above are made where necessary.  On exam, RRR.  Doing well post ablation.  DC to home with routine wound care and follow-up.  Co Sign: Thompson Grayer, MD 10/12/2018 10:37 PM

## 2018-10-13 ENCOUNTER — Encounter (HOSPITAL_COMMUNITY): Payer: Self-pay | Admitting: Cardiology

## 2018-10-15 ENCOUNTER — Other Ambulatory Visit: Payer: Self-pay | Admitting: Internal Medicine

## 2018-10-15 NOTE — Telephone Encounter (Signed)
Pt last saw Dr Rayann Heman 10/01/18, last labs 10/11/18 Creat 1.23, age 73, weight 97.5kg, based on specified criteria pt is on appropriate dosage of Eliquis 5mg  BID.  Will refill rx.

## 2018-11-08 ENCOUNTER — Ambulatory Visit (HOSPITAL_COMMUNITY): Payer: Medicare Other | Admitting: Nurse Practitioner

## 2018-11-08 ENCOUNTER — Other Ambulatory Visit: Payer: Self-pay | Admitting: Internal Medicine

## 2018-12-17 DIAGNOSIS — R42 Dizziness and giddiness: Secondary | ICD-10-CM | POA: Diagnosis not present

## 2018-12-18 DIAGNOSIS — K7460 Unspecified cirrhosis of liver: Secondary | ICD-10-CM | POA: Diagnosis not present

## 2018-12-18 DIAGNOSIS — K769 Liver disease, unspecified: Secondary | ICD-10-CM | POA: Diagnosis not present

## 2018-12-19 DIAGNOSIS — R42 Dizziness and giddiness: Secondary | ICD-10-CM | POA: Diagnosis not present

## 2018-12-25 DIAGNOSIS — K7460 Unspecified cirrhosis of liver: Secondary | ICD-10-CM | POA: Diagnosis not present

## 2018-12-29 ENCOUNTER — Other Ambulatory Visit: Payer: Self-pay | Admitting: Cardiology

## 2018-12-29 IMAGING — CR DG CHEST 2V
2 series · 2 of 2 positions shown · non-contrast
Comparison: 02/15/2018

CLINICAL DATA: Dizziness rapid pulse

EXAM:
CHEST - 2 VIEW

[w chest pa]
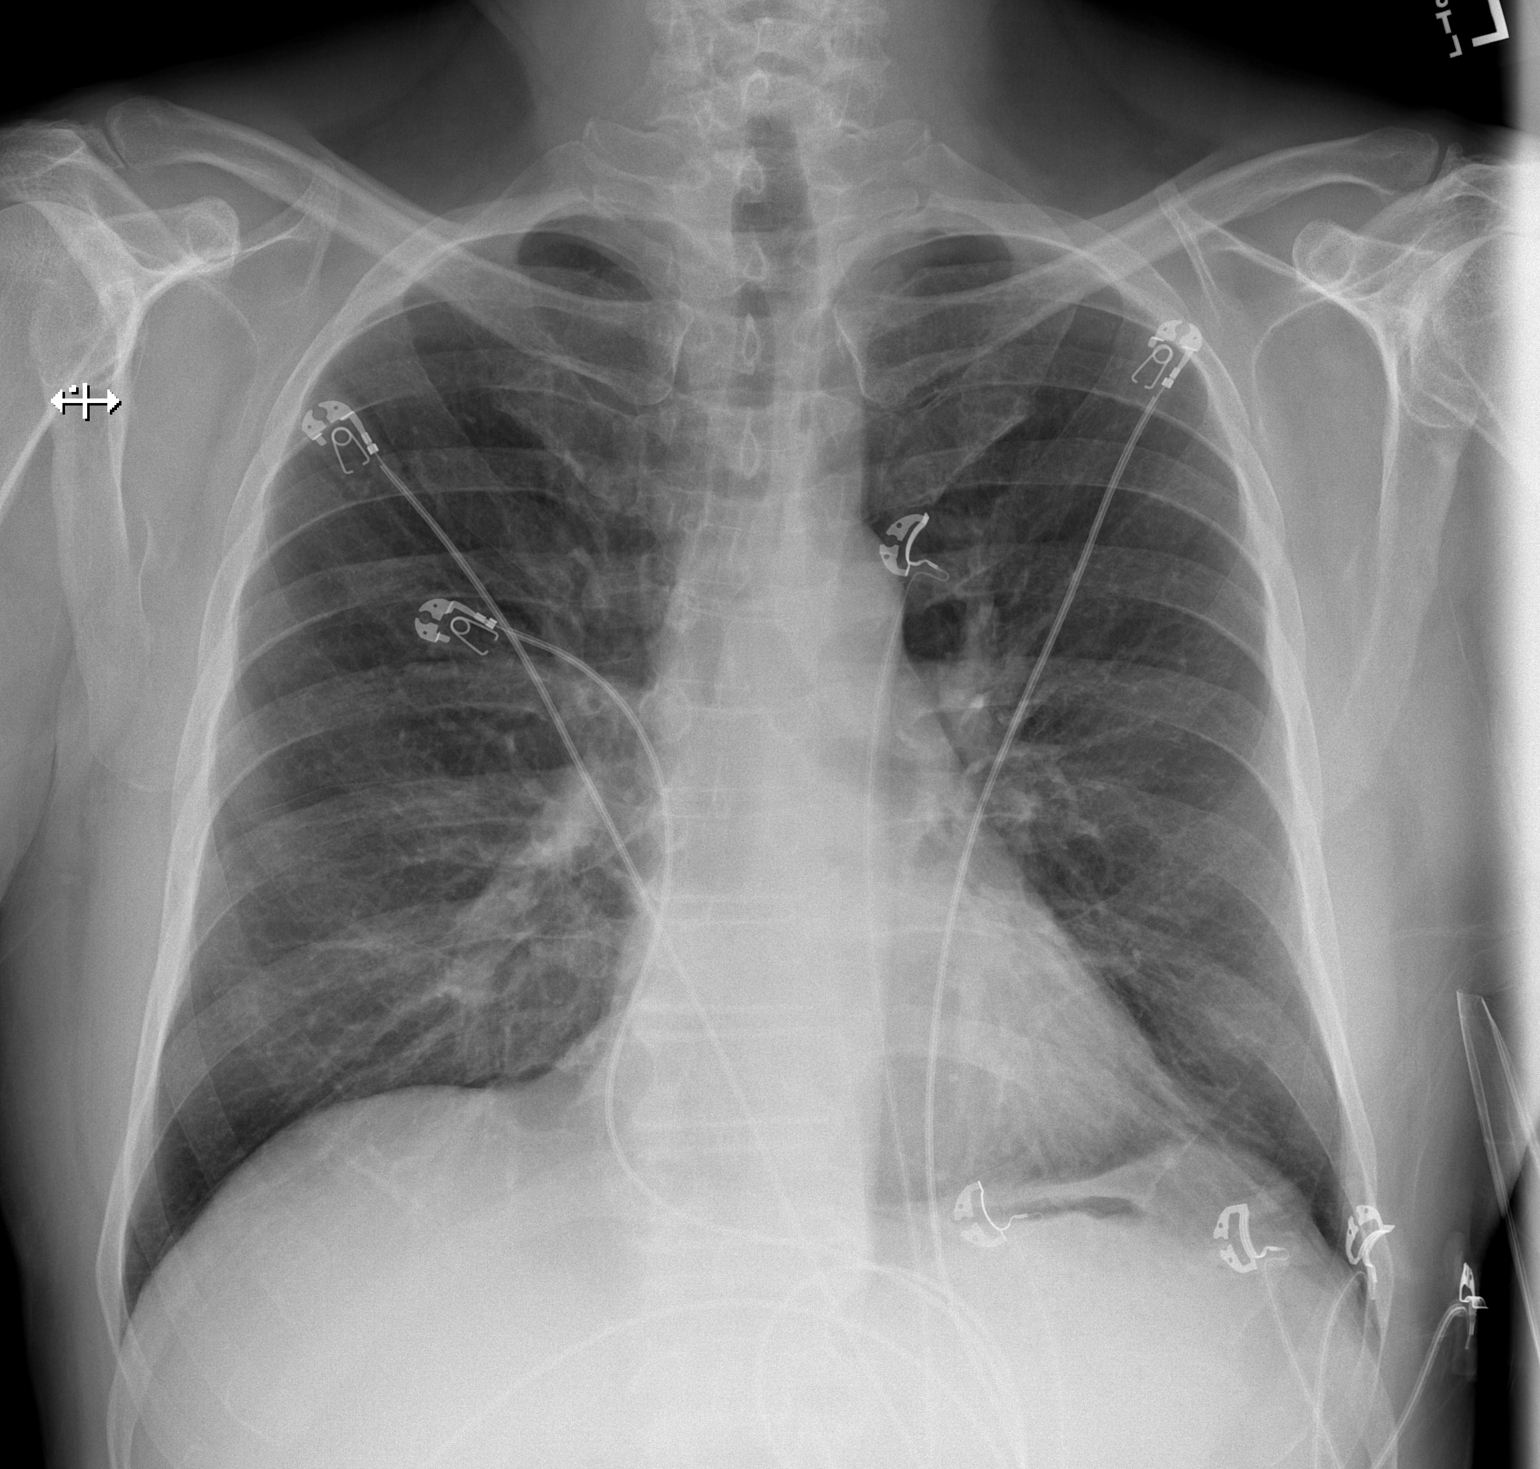

[w chest lat]
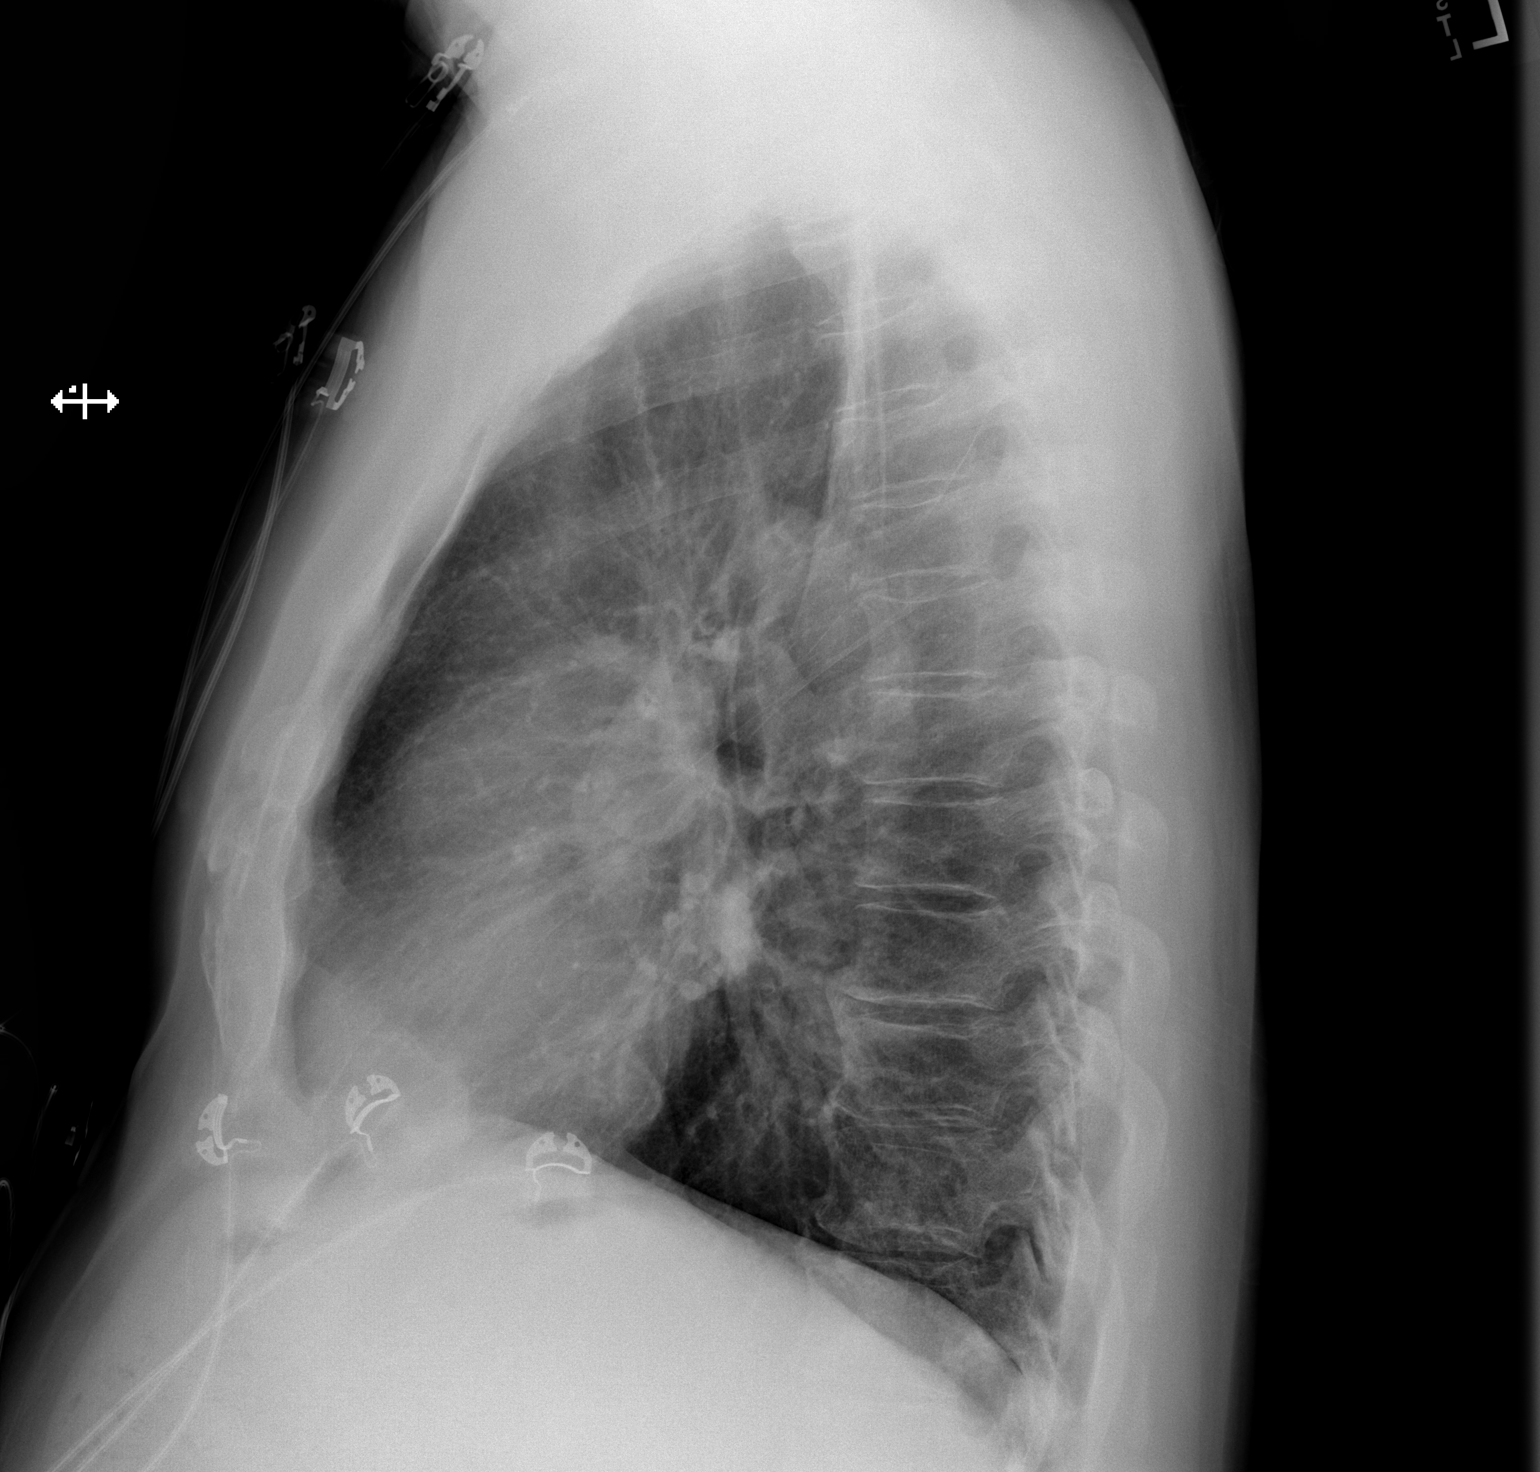

[2 of 2 positions shown; findings below may reference images not displayed]

FINDINGS: The heart size and mediastinal contours are within normal limits.
Both lungs are clear. Mild degenerative changes of the spine.
IMPRESSION: No active cardiopulmonary disease.

## 2019-01-02 ENCOUNTER — Telehealth: Payer: Self-pay | Admitting: Cardiology

## 2019-01-02 ENCOUNTER — Encounter: Payer: Self-pay | Admitting: *Deleted

## 2019-01-02 NOTE — Telephone Encounter (Signed)
  Patient wanted to speak with [name] about an appointment with Dr Meda Coffee. He would like to see her and not a PA. I could not find any available schedule.

## 2019-01-02 NOTE — Telephone Encounter (Signed)
Virtual Visit Pre-Appointment Phone Call  "(Name), I am calling you today to discuss your upcoming appointment. We are currently trying to limit exposure to the virus that causes COVID-19 by seeing patients at home rather than in the office."  1. "What is the BEST phone number to call the day of the visit?" - include this in appointment notes-YES UPDATED  2. Do you have or have access to (through a family member/friend) a smartphone with video capability that we can use for your visit?" a. If yes - list this number in appt notes as cell (if different from BEST phone #) and list the appointment type as a VIDEO visit in appointment notes-YES UPDATED   3. Confirm consent - "In the setting of the current Covid19 crisis, you are scheduled for a ( video) visit with Dr. Meda Coffee on 01/04/19 at 0930.  Just as we do with many in-office visits, in order for you to participate in this visit, we must obtain consent.  If you'd like, I can send this to your mychart (if signed up) or email for you to review.  Otherwise, I can obtain your verbal consent now.  All virtual visits are billed to your insurance company just like a normal visit would be.  By agreeing to a virtual visit, we'd like you to understand that the technology does not allow for your provider to perform an examination, and thus may limit your provider's ability to fully assess your condition. If your provider identifies any concerns that need to be evaluated in person, we will make arrangements to do so.  Finally, though the technology is pretty good, we cannot assure that it will always work on either your or our end, and in the setting of a video visit, we may have to convert it to a phone-only visit.  In either situation, we cannot ensure that we have a secure connection.  Are you willing to proceed?" STAFF: Did the patient verbally acknowledge consent to telehealth visit? Document YES/NO here: YES PT GAVE VERBAL CONSENT TO TREAT AND ALSO SENT TO  [place] TO REVIEW.  4. Advise patient to be prepared - "Two hours prior to your appointment, go ahead and check your blood pressure, pulse, oxygen saturation, and your weight (if you have the equipment to check those) and write them all down. When your visit starts, your provider will ask you for this information. If you have an Apple Watch or Kardia device, please plan to have heart rate information ready on the day of your appointment. Please have a pen and paper handy nearby the day of the visit as well."-YES PT AWARE TO CHECK BP/HR/Wt- PRIOR TO APPT.  5. Give patient instructions for  smartphone Doxy.me as below if video visit (depending on what platform provider is using)-YES GIVEN  6. Inform patient they will receive a phone call 15 minutes prior to their appointment time (may be from unknown caller ID) so they should be prepared to [place] has been deemed a candidate for a follow-up tele-health visit to limit community exposure during the Covid-19 pandemic. I spoke with the patient via phone to ensure availability of phone/video source, confirm preferred email & phone number, and discuss instructions and expectations.  I reminded ANES RIGEL to be prepared with any vital sign and/or heart rhythm information that could potentially be obtained via home monitoring, at the time of his visit. I reminded [name]  to expect a phone call prior to his visit.  Nuala Alpha, LPN 4/0/9735 3:29 PM   IF USING  [email or web address] - The patient will receive a link just prior to their visit by text.-YES GIVEN     FULL LENGTH CONSENT FOR TELE-HEALTH VISIT   I hereby voluntarily request, consent and authorize [facility] and its employed or contracted physicians, physician assistants, nurse practitioners or other licensed health care professionals (the Practitioner), to provide me with telemedicine health care services (the Services") as deemed  necessary by the treating Practitioner. I acknowledge and consent to receive the Services by the Practitioner via telemedicine. I understand that the telemedicine visit will involve communicating with the Practitioner through live audiovisual communication technology and the disclosure of certain medical information by electronic transmission. I acknowledge that I have been given the opportunity to request an in-person assessment or other available alternative prior to the telemedicine visit and am voluntarily participating in the telemedicine visit.  I understand that I have the right to withhold or withdraw my consent to the use of telemedicine in the course of my care at any time, without affecting my right to future care or treatment, and that the Practitioner or I may terminate the telemedicine visit at any time. I understand that I have the right to inspect all information obtained and/or recorded in the course of the telemedicine visit and may receive copies of available information for a reasonable fee.  I understand that some of the potential risks of receiving the Services via telemedicine include:   Delay or interruption in medical evaluation due to technological equipment failure or disruption;  Information transmitted may not be sufficient (e.g. poor resolution of images) to allow for appropriate medical decision making by the Practitioner; and/or   In rare instances, security protocols could fail, causing a breach of personal health information.  Furthermore, I acknowledge that it is my responsibility to provide information about my medical history, conditions and care that is complete and accurate to the best of my ability. I acknowledge that Practitioner's advice, recommendations, and/or decision may be based on factors not within their control, such as incomplete or inaccurate data provided by me or distortions of diagnostic images or specimens that may result from electronic  transmissions. I understand that the practice of medicine is not an exact science and that Practitioner makes no warranties or guarantees regarding treatment outcomes. I acknowledge that I will receive a copy of this consent concurrently upon execution via email to the email address I last provided but may also request a printed copy by calling the office of [facility].    I understand that my insurance will be billed for this visit.   I have read or had this consent read to me.  I understand the contents of this consent, which adequately explains the benefits and risks of the Services being provided via telemedicine.   I have been provided ample opportunity to ask questions regarding this consent and the Services and have had my questions answered to my satisfaction.  I give my informed consent for the services to be provided through the use of telemedicine in my medical care  By participating in this telemedicine visit I agree to the above. PT GAVE VERBAL CONSENT TO TREAT AS WELL AS CONSENT SENT TO HIS MYCHART TO REVIEW.

## 2019-01-04 ENCOUNTER — Encounter: Payer: Self-pay | Admitting: Cardiology

## 2019-01-04 ENCOUNTER — Telehealth (INDEPENDENT_AMBULATORY_CARE_PROVIDER_SITE_OTHER): Payer: Medicare Other | Admitting: Cardiology

## 2019-01-04 ENCOUNTER — Other Ambulatory Visit: Payer: Self-pay

## 2019-01-04 VITALS — BP 132/66 | HR 56 | Ht 72.0 in | Wt 210.0 lb

## 2019-01-04 DIAGNOSIS — I480 Paroxysmal atrial fibrillation: Secondary | ICD-10-CM

## 2019-01-04 DIAGNOSIS — I7121 Aneurysm of the ascending aorta, without rupture: Secondary | ICD-10-CM

## 2019-01-04 DIAGNOSIS — I712 Thoracic aortic aneurysm, without rupture, unspecified: Secondary | ICD-10-CM

## 2019-01-04 DIAGNOSIS — I2510 Atherosclerotic heart disease of native coronary artery without angina pectoris: Secondary | ICD-10-CM

## 2019-01-04 DIAGNOSIS — E782 Mixed hyperlipidemia: Secondary | ICD-10-CM

## 2019-01-04 MED ORDER — HYDROCHLOROTHIAZIDE 12.5 MG PO CAPS: 12.5000 mg | ORAL_CAPSULE | Freq: Every day | ORAL | 3 refills | Status: DC

## 2019-01-04 MED ORDER — ROSUVASTATIN CALCIUM 5 MG PO TABS: 5.0000 mg | ORAL_TABLET | Freq: Every day | ORAL | 3 refills | Status: DC

## 2019-01-04 NOTE — Addendum Note (Signed)
Addended by: Dollene Primrose on: 01/04/2019 11:15 AM   Modules accepted: Orders

## 2019-01-04 NOTE — Patient Instructions (Addendum)
Called pt to discuss the following recommendations. He had no additional questions.   Medication Instructions:  Your physician has recommended you make the following change in your medication:  Stop Zetia Decrease your HCTZ to 12.5mg , half tablet, once daily. Begin Crestor, 5mg  tablet, once daily.   Labwork: Please come by the office on July 7 for the following lab work.   CMP, CBC, TSH, Hbg A1C, and fasting lipids.   Please arrive fasting for your lipid profile.  Testing/Procedures:  Dr Meda Coffee would like you to have a Chest MRA done in November  Follow-Up:  Your physician recommends that you schedule a follow-up appointment in: 6 months with Dr Meda Coffee  Any Other Special Instructions Will Be Listed Below (If Applicable).     If you need a refill on your cardiac medications before your next appointment, please call your pharmacy.

## 2019-01-04 NOTE — Progress Notes (Signed)
Virtual Visit via Video Note   This visit type was conducted due to national recommendations for restrictions regarding the COVID-19 Pandemic (e.g. social distancing) in an effort to limit this patient's exposure and mitigate transmission in our community.  Due to his co-morbid illnesses, this patient is at least at moderate risk for complications without adequate follow up.  This format is felt to be most appropriate for this patient at this time.  All issues noted in this document were discussed and addressed.  A limited physical exam was performed with this format.  Please refer to the patient's chart for his consent to telehealth for [facility].   Date:  01/04/2019   ID:  [name], DOB [birth year], MRN [id number]  Patient Location: Home Provider Location: Home  PCP:  Antony Contras, MD  Cardiologist:  Ena Dawley, MD  Electrophysiologist:  Thompson Grayer, MD   Evaluation Performed:  Follow-Up Visit  Chief Complaint:  hypotension  History of Present Illness:    Timothy Dickson is a 73 y.o. male with history of hypertension, ascending aortic aneurysm, hyperlipidemia, coronary calcifications, persistent atrial fibrillation and SVT.  They have failed medical therapy with Flecainide and underwent EPS/RFCA of atrial fibrillation in 09/2018.  This patients CHA2DS2-VASc Score and unadjusted Ischemic Stroke Rate (% per year) is equal to 2.2 % stroke rate/year from a score of 2 Above score calculated as 1 point each if present [CHF, HTN, DM, Vascular=MI/PAD/Aortic Plaque, Age if 65-74, or Male] Above score calculated as 2 points each if present [Age > 75, or Stroke/TIA/TE]  01/04/2019 -patient underwent A. fib ablation in March and it was successful.  He continues to take carvedilol and flecainide as well as hydrochlorothiazide for blood pressure, he has been experiencing some episodes of low blood pressure associated with dizziness.  He otherwise has no bleeding.  He was seen by his  liver doctor at [facility] who recommended starting statin.  He denies any chest pain shortness of breath he bikes frequently.  The patient does not have symptoms concerning for COVID-19 infection (fever, chills, cough, or new shortness of breath).   Past Medical History:  Diagnosis Date  . Atrial tachycardia ([place])    documented by holter 2016. Followed by Dr. Liane Comber and Dr. Rayann Heman.  . 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 no further tx other than surgery [facility].  [name] Cirrhosis ([place])    [facility] dx, early stage- follow up visits at [facility].  [name] CRI (chronic renal insufficiency)    s/p R nephrectomy in 1999 for malignancy  . Diabetes mellitus without complication (HCC)    NIDDM- dx. 3-4 years ago diet control  . Diverticulosis of colon    mild  . GERD (gastroesophageal reflux disease)   . Headache(784.0)   . Hepatitis C    s/p therapy [facility] (curative)/with Harvoni  . History of BPH    no recent issues  . History of substance abuse ([place])    past history -none in 38 yrs  . Hyperglycemia   . Hypertension   . Osteoarthritis, knee    knees bilaterally   . Pneumonia 1975   hx of 35 years ago   Past Surgical History:  Procedure Laterality Date  . Arthroscopy right knee     may '11 (Dr Alvan Dame)  . Arthroscopy, left knee,    . ATRIAL FIBRILLATION ABLATION N/A 10/11/2018   Procedure: ATRIAL FIBRILLATION ABLATION;  Surgeon: Thompson Grayer, MD;  Location: [place] [facility];  Service: Cardiovascular;  Laterality: N/A;  . history of liver biopsy  2007  . History of Nephrectomy Right 1999  . KNEE ARTHROSCOPY Left 10/10/2012   Procedure: LEFT KNEE ARTHROSCOPY WITH MENSCIAL DEBRIDEMENT AND CHONDROPLASTY;  Surgeon: Gearlean Alf, MD;  Location: [facility];  Service: Orthopedics;  Laterality: Left;  . left shoulder repair for bone spur  1990's  . TEE WITHOUT CARDIOVERSION N/A 10/11/2018   Procedure: TRANSESOPHAGEAL ECHOCARDIOGRAM (TEE);  Surgeon: Buford Dresser, MD;  Location: [facility] ENDOSCOPY;   Service: Cardiovascular;  Laterality: N/A;  ablation to follow at 1030  . TOTAL KNEE ARTHROPLASTY Right 08/15/2016   Procedure: RIGHT TOTAL KNEE ARTHROPLASTY, CORTISONE INJECTION OF LEFT KNEE;  Surgeon: Gaynelle Arabian, MD;  Location: [facility];  Service: Orthopedics;  Laterality: Right;  . TOTAL KNEE ARTHROPLASTY Left 09/04/2017   Procedure: LEFT TOTAL KNEE ARTHROPLASTY;  Surgeon: Gaynelle Arabian, MD;  [facility]: [facility];  Service: Orthopedics;  Laterality: Left;  . Transurethral needle ablation procedure    . TUNA procedure       Current Meds  Medication Sig  . acetaminophen (TYLENOL) 650 MG CR tablet Take 650 mg by mouth at bedtime.   [name] allopurinol (ZYLOPRIM) 100 MG tablet Take 100 mg by mouth every evening.   . Ascorbic Acid (VITAMIN C) 1000 MG tablet Take 1,000 mg by mouth daily.  [name] b complex vitamins capsule Take 1 capsule by mouth daily.   . carvedilol (COREG) 12.5 MG tablet Take 1 tablet (12.5 mg total) by mouth 2 (two) times daily with a meal.  . ELIQUIS 5 MG TABS tablet TAKE 1 TABLET BY MOUTH TWICE DAILY.  [name] ezetimibe (ZETIA) 10 MG tablet TAKE 1 TABLET BY MOUTH DAILY.  . flecainide (TAMBOCOR) 100 MG tablet TAKE 1 TABLET BY MOUTH TWICE DAILY.  [name] gabapentin (NEURONTIN) 600 MG tablet Take 900 mg by mouth at bedtime.   . hydrochlorothiazide (HYDRODIURIL) 25 MG tablet Take 1 tablet (25 mg total) by mouth daily.  [name] omeprazole (PRILOSEC) 20 MG capsule Take 20 mg by mouth 3 (three) times a week.     Allergies:   Statins; Acetaminophen; Aspirin; and Nsaids   Social History   Tobacco Use  . Smoking status: Former Smoker    Packs/day: 1.00    Years: 15.00    Pack years: 15.00    Types: Cigarettes    Last attempt to quit: 08/01/1977    Years since quitting: 41.4  . Smokeless tobacco: Never Used  Substance Use Topics  . Alcohol use: No    Comment: past history -none in 38 yrs  . Drug use: No    Comment: past hx none in 38 yrs     Family Hx: The patient's family history includes Alcohol  abuse in his father, sister, and another family member; Breast cancer in his sister; CVA in his mother; Cancer in his brother; Coronary artery disease in his mother; Diabetes in his sister; Esophageal cancer in his sister; Lung cancer in his sister; Prostate cancer in his brother.  ROS:   Please see the history of present illness.    All other systems reviewed and are negative.   Prior CV studies:   The following studies were reviewed today:  TTE: 03/23/2018   Left ventricle: The cavity size was normal. Systolic function was   normal. The estimated ejection fraction was in the range of 60%   to 65%. Wall motion was normal; there were no regional wall   motion abnormalities. Doppler parameters are consistent with   abnormal  left ventricular relaxation (grade 1 diastolic   dysfunction). There was no evidence of elevated ventricular   filling pressure by Doppler parameters. - Aortic valve: There was no regurgitation. - Aortic root: The aortic root was normal in size. - Left atrium: The atrium was mildly dilated. - Right ventricle: The cavity size was normal. Wall thickness was   normal. Systolic function was normal. - Right atrium: The atrium was normal in size. - Tricuspid valve: There was mild regurgitation. - Pulmonary arteries: Systolic pressure was at the upper limits of   normal. - Inferior vena cava: The vessel was normal in size. - Pericardium, extracardiac: There was no pericardial effusion.  Labs/Other Tests and Data Reviewed:    EKG:  No ECG reviewed.  Recent Labs: 10/11/2018: BUN 18; Creatinine, Ser 1.23; Hemoglobin 15.4; Platelets 218; Potassium 3.4; Sodium 137   Recent Lipid Panel Lab Results  Component Value Date/Time   CHOL 150 03/16/2016 10:55 AM   CHOL 206 (H) 12/21/2015 03:22 PM   TRIG 70 03/16/2016 10:55 AM   TRIG 161 (H) 12/21/2015 03:22 PM   HDL 57 03/16/2016 10:55 AM   HDL 45 12/21/2015 03:22 PM   CHOLHDL 2.6 03/16/2016 10:55 AM   LDLCALC 79  03/16/2016 10:55 AM   LDLCALC 129 12/21/2015 03:22 PM    Wt Readings from Last 3 Encounters:  01/04/19 210 lb (95.3 kg)  10/12/18 210 lb 8.6 oz (95.5 kg)  10/01/18 215 lb (97.5 kg)     Objective:    Vital Signs:  BP 132/66   Pulse (!) 56   Ht 6' (1.829 m)   Wt 210 lb (95.3 kg)   SpO2 97%   BMI 28.48 kg/m    VITAL SIGNS:  reviewed    ASSESSMENT & PLAN:    1.  Paroxysmal atrial fibrillation -Status post successful ablation in March 2020 -He will follow with Dr. Rayann Heman and ask about potential discontinuation of flecainide and Eliquis -Continue Coreg 12.5 p.o. twice daily  2.  HTN -episodes of hypotension, I will decrease hydrochlorothiazide to 12.5 mg daily.  3.  Hx of ascending aortic aneurysm stable on last MRI measuring 42 mm and no aortic regurgitation.    We will repeat chest MRA in November 2020.  4.  Hyperlipidemia -he has liver cirrhosis, his hepatologist approved statins, will try Crestor 5 mg daily and arrange follow-up labs with lipids and LFTs in 4 weeks.   5.  Coronary calcifications -seen on calcium score in 2017, he is asymptomatic but now willing to take Crestor 5 mg daily.    Current medicines are reviewed with the patient today.  The patient Has no concerns regarding medicines.  COVID-19 Education: The signs and symptoms of COVID-19 were discussed with the patient and how to seek care for testing (follow up with PCP or arrange E-visit).  The importance of social distancing was discussed today.  Time:   Today, I have spent 25 minutes with the patient with telehealth technology discussing the above problems.     Medication Adjustments/Labs and Tests Ordered: Current medicines are reviewed at length with the patient today.  Concerns regarding medicines are outlined above.   Tests Ordered: No orders of the defined types were placed in this encounter.   Medication Changes: No orders of the defined types were placed in this encounter.    Disposition:  Follow up in 6 month(s)  Signed, Ena Dawley, MD  01/04/2019 10:12 AM    [place]

## 2019-01-07 DIAGNOSIS — Z0000 Encounter for general adult medical examination without abnormal findings: Secondary | ICD-10-CM | POA: Diagnosis not present

## 2019-01-07 DIAGNOSIS — E1140 Type 2 diabetes mellitus with diabetic neuropathy, unspecified: Secondary | ICD-10-CM | POA: Diagnosis not present

## 2019-01-07 DIAGNOSIS — E1142 Type 2 diabetes mellitus with diabetic polyneuropathy: Secondary | ICD-10-CM | POA: Diagnosis not present

## 2019-01-07 DIAGNOSIS — I10 Essential (primary) hypertension: Secondary | ICD-10-CM | POA: Diagnosis not present

## 2019-01-14 ENCOUNTER — Telehealth: Payer: Self-pay

## 2019-01-17 ENCOUNTER — Telehealth (INDEPENDENT_AMBULATORY_CARE_PROVIDER_SITE_OTHER): Payer: Medicare Other | Admitting: Internal Medicine

## 2019-01-17 ENCOUNTER — Encounter: Payer: Self-pay | Admitting: Internal Medicine

## 2019-01-17 VITALS — BP 132/74 | HR 55 | Ht 72.0 in | Wt 210.0 lb

## 2019-01-17 DIAGNOSIS — I480 Paroxysmal atrial fibrillation: Secondary | ICD-10-CM | POA: Diagnosis not present

## 2019-01-17 DIAGNOSIS — I10 Essential (primary) hypertension: Secondary | ICD-10-CM | POA: Diagnosis not present

## 2019-01-17 NOTE — Progress Notes (Signed)
Electrophysiology TeleHealth Note   Due to national recommendations of social distancing due to COVID 19, an audio/video telehealth visit is felt to be most appropriate for this patient at this time.  See MyChart message from today for the patient's consent to telehealth for [facility].   Date:  01/17/2019   ID:  Timothy Dickson, DOB 1945/08/10, MRN [id number]  Location: patient's home  Provider location:  [facility]  Evaluation Performed: Follow-up visit  PCP:  Antony Contras, MD   Electrophysiologist:  Dr Rayann Heman  Chief Complaint:  palpitations  History of Present Illness:    Timothy Dickson is a 73 y.o. male who presents via telehealth conferencing today.  Since last being seen in our clinic, the patient reports doing very well.  He reports doing well post ablation.  Denies procedure related complications.  Pleased with results.  No afib! Today, he denies symptoms of palpitations, chest pain, shortness of breath,  lower extremity edema, dizziness, presyncope, or syncope.  The patient is otherwise without complaint today.  The patient denies symptoms of fevers, chills, cough, or new SOB worrisome for COVID 19.  Past Medical History:  Diagnosis Date  . Atrial tachycardia ([place])    documented by holter 2016. Followed by Dr. Liane Comber and Dr. Rayann Heman.  . 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 no further tx other than surgery [facility].  [name] Cirrhosis ([place])    [facility] dx, early stage- follow up visits at [facility].  [name] CRI (chronic renal insufficiency)    s/p R nephrectomy in 1999 for malignancy  . Diabetes mellitus without complication (HCC)    NIDDM- dx. 3-4 years ago diet control  . Diverticulosis of colon    mild  . GERD (gastroesophageal reflux disease)   . Headache(784.0)   . Hepatitis C    s/p therapy [facility] (curative)/with Harvoni  . History of BPH    no recent issues  . History of substance abuse ([place])    past history -none in 38 yrs  . Hyperglycemia   . Hypertension    . Osteoarthritis, knee    knees bilaterally   . Pneumonia 1975   hx of 35 years ago    Past Surgical History:  Procedure Laterality Date  . Arthroscopy right knee     may '11 (Dr Alvan Dame)  . Arthroscopy, left knee,    . ATRIAL FIBRILLATION ABLATION N/A 10/11/2018   Procedure: ATRIAL FIBRILLATION ABLATION;  Surgeon: Thompson Grayer, MD;  Location: [place] [facility];  Service: Cardiovascular;  Laterality: N/A;  . history of liver biopsy  2007  . History of Nephrectomy Right 1999  . KNEE ARTHROSCOPY Left 10/10/2012   Procedure: LEFT KNEE ARTHROSCOPY WITH MENSCIAL DEBRIDEMENT AND CHONDROPLASTY;  Surgeon: Gearlean Alf, MD;  [facility];  Service: Orthopedics;  Laterality: Left;  . left shoulder repair for bone spur  1990's  . TEE WITHOUT CARDIOVERSION N/A 10/11/2018   Procedure: TRANSESOPHAGEAL ECHOCARDIOGRAM (TEE);  Surgeon: Buford Dresser, MD;  Location: [facility] ENDOSCOPY;  Service: Cardiovascular;  Laterality: N/A;  ablation to follow at 1030  . TOTAL KNEE ARTHROPLASTY Right 08/15/2016   Procedure: RIGHT TOTAL KNEE ARTHROPLASTY, CORTISONE INJECTION OF LEFT KNEE;  Surgeon: Gaynelle Arabian, MD;  [facility];  Service: Orthopedics;  Laterality: Right;  . TOTAL KNEE ARTHROPLASTY Left 09/04/2017   Procedure: LEFT TOTAL KNEE ARTHROPLASTY;  Surgeon: Gaynelle Arabian, MD;  [facility];  Service: Orthopedics;  Laterality: Left;  . Transurethral needle ablation procedure    .  TUNA procedure      Current Outpatient Medications  Medication Sig Dispense Refill  . acetaminophen (TYLENOL) 650 MG CR tablet Take 650 mg by mouth at bedtime.     [name] allopurinol (ZYLOPRIM) 100 MG tablet Take 100 mg by mouth every evening.     . Ascorbic Acid (VITAMIN C) 1000 MG tablet Take 1,000 mg by mouth daily.    [name] b complex vitamins capsule Take 1 capsule by mouth daily.     . carvedilol (COREG) 12.5 MG tablet Take 1 tablet (12.5 mg total) by mouth 2 (two) times daily with a meal. 180 tablet 3  . ELIQUIS 5  MG TABS tablet TAKE 1 TABLET BY MOUTH TWICE DAILY. 60 tablet 11  . flecainide (TAMBOCOR) 100 MG tablet TAKE 1 TABLET BY MOUTH TWICE DAILY. 120 tablet 2  . gabapentin (NEURONTIN) 600 MG tablet Take 900 mg by mouth at bedtime.     . hydrochlorothiazide (MICROZIDE) 12.5 MG capsule Take 1 capsule (12.5 mg total) by mouth daily. 90 capsule 3  . omeprazole (PRILOSEC) 20 MG capsule Take 20 mg by mouth 3 (three) times a week.    . rosuvastatin (CRESTOR) 5 MG tablet Take 1 tablet (5 mg total) by mouth daily. 90 tablet 3   No current facility-administered medications for this visit.     Allergies:   Statins, Acetaminophen, Aspirin, and Nsaids   Social History:  The patient  reports that he quit smoking about 41 years ago. His smoking use included cigarettes. He has a 15.00 pack-year smoking history. He has never used smokeless tobacco. He reports that he does not drink alcohol or use drugs.   Family History:  The patient's  family history includes Alcohol abuse in his father, sister, and another family member; Breast cancer in his sister; CVA in his mother; Cancer in his brother; Coronary artery disease in his mother; Diabetes in his sister; Esophageal cancer in his sister; Lung cancer in his sister; Prostate cancer in his brother.   ROS:  Please see the history of present illness.   All other systems are personally reviewed and negative.    Exam:    Vital Signs:  BP 132/74   Pulse (!) 55   Ht 6' (1.829 m)   Wt 210 lb (95.3 kg)   SpO2 97%   BMI 28.48 kg/m   Well sounding today   Labs/Other Tests and Data Reviewed:    Recent Labs: 10/11/2018: BUN 18; Creatinine, Ser 1.23; Hemoglobin 15.4; Platelets 218; Potassium 3.4; Sodium 137   Wt Readings from Last 3 Encounters:  01/17/19 210 lb (95.3 kg)  01/04/19 210 lb (95.3 kg)  10/12/18 210 lb 8.6 oz (95.5 kg)      ASSESSMENT & PLAN:    1.  Paroxysmal atrial fibrillation Doing great post ablation! Stop flecainide Continue eliquis for  chads2vasc score of 4  2. HTN Stable No change required today   Follow-up:  3 months   Patient Risk:  after full review of this patients clinical status, I feel that they are at moderate risk at this time.  Today, I have spent 15 minutes with the patient with telehealth technology discussing arrhythmia management .    Army Fossa, MD  01/17/2019 10:50 AM     [facility] [address] [place] [address] [phone] (office) [phone] (fax)

## 2019-01-19 DIAGNOSIS — E782 Mixed hyperlipidemia: Secondary | ICD-10-CM

## 2019-01-21 MED ORDER — PRAVASTATIN SODIUM 10 MG PO TABS: 10.0000 mg | ORAL_TABLET | Freq: Every day | ORAL | 3 refills | Status: DC

## 2019-01-21 NOTE — Telephone Encounter (Signed)
Spoke with the patient, he expressed understanding about stopping crestor and starting pravastatin. His labs are scheduled for 02/25/19.

## 2019-01-25 ENCOUNTER — Other Ambulatory Visit: Payer: Self-pay

## 2019-01-25 ENCOUNTER — Ambulatory Visit (HOSPITAL_COMMUNITY)
Admission: RE | Admit: 2019-01-25 | Discharge: 2019-01-25 | Disposition: A | Discharge status: Home / Self Care | Payer: Medicare Other | Source: Ambulatory Visit | Attending: Physician Assistant | Admitting: Physician Assistant

## 2019-01-25 ENCOUNTER — Encounter (HOSPITAL_COMMUNITY): Payer: Self-pay | Admitting: Physician Assistant

## 2019-01-25 VITALS — BP 152/86 | HR 59 | Ht 72.0 in | Wt 209.0 lb

## 2019-01-25 DIAGNOSIS — Z8249 Family history of ischemic heart disease and other diseases of the circulatory system: Secondary | ICD-10-CM | POA: Insufficient documentation

## 2019-01-25 DIAGNOSIS — I129 Hypertensive chronic kidney disease with stage 1 through stage 4 chronic kidney disease, or unspecified chronic kidney disease: Secondary | ICD-10-CM | POA: Insufficient documentation

## 2019-01-25 DIAGNOSIS — Z886 Allergy status to analgesic agent status: Secondary | ICD-10-CM | POA: Insufficient documentation

## 2019-01-25 DIAGNOSIS — E1122 Type 2 diabetes mellitus with diabetic chronic kidney disease: Secondary | ICD-10-CM | POA: Diagnosis not present

## 2019-01-25 DIAGNOSIS — Z8619 Personal history of other infectious and parasitic diseases: Secondary | ICD-10-CM | POA: Insufficient documentation

## 2019-01-25 DIAGNOSIS — N400 Enlarged prostate without lower urinary tract symptoms: Secondary | ICD-10-CM | POA: Diagnosis not present

## 2019-01-25 DIAGNOSIS — N189 Chronic kidney disease, unspecified: Secondary | ICD-10-CM | POA: Insufficient documentation

## 2019-01-25 DIAGNOSIS — Z888 Allergy status to other drugs, medicaments and biological substances status: Secondary | ICD-10-CM | POA: Diagnosis not present

## 2019-01-25 DIAGNOSIS — Z905 Acquired absence of kidney: Secondary | ICD-10-CM | POA: Insufficient documentation

## 2019-01-25 DIAGNOSIS — Z96653 Presence of artificial knee joint, bilateral: Secondary | ICD-10-CM | POA: Diagnosis not present

## 2019-01-25 DIAGNOSIS — Z7901 Long term (current) use of anticoagulants: Secondary | ICD-10-CM | POA: Insufficient documentation

## 2019-01-25 DIAGNOSIS — K219 Gastro-esophageal reflux disease without esophagitis: Secondary | ICD-10-CM | POA: Diagnosis not present

## 2019-01-25 DIAGNOSIS — K7460 Unspecified cirrhosis of liver: Secondary | ICD-10-CM | POA: Diagnosis not present

## 2019-01-25 DIAGNOSIS — I480 Paroxysmal atrial fibrillation: Secondary | ICD-10-CM | POA: Diagnosis not present

## 2019-01-25 DIAGNOSIS — Z87891 Personal history of nicotine dependence: Secondary | ICD-10-CM | POA: Insufficient documentation

## 2019-01-25 DIAGNOSIS — Z79899 Other long term (current) drug therapy: Secondary | ICD-10-CM | POA: Insufficient documentation

## 2019-01-25 DIAGNOSIS — I4819 Other persistent atrial fibrillation: Secondary | ICD-10-CM | POA: Insufficient documentation

## 2019-01-25 DIAGNOSIS — Z85528 Personal history of other malignant neoplasm of kidney: Secondary | ICD-10-CM | POA: Insufficient documentation

## 2019-01-25 NOTE — Progress Notes (Signed)
Primary Care Physician: Antony Contras, MD Primary Cardiologist: Dr Meda Coffee Primary Electrophysiologist: Dr Thompson Grayer Timothy Dickson is a 73 y.o. 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 Patient is s/p afib ablation with Dr Rayann Heman on 10/11/18. Overall he has done well but has noticed some palpitations since stopping the flecainide. These episodes last about 5-10 minutes and then resolve. No other associated symptoms. No triggers that the patient can identify.   Today, he denies symptoms of chest pain, orthopnea, PND, lower extremity edema, dizziness, presyncope, syncope, snoring, daytime somnolence, bleeding, or neurologic sequela. The patient is tolerating medications without difficulties and is otherwise without complaint today. +palpitations   Atrial Fibrillation Risk Factors:  he does not have symptoms or diagnosis of sleep apnea. he does not have a history of rheumatic fever. he does not have a history of alcohol use.  he has a BMI of Body mass index is 28.35 kg/m.[name] Filed Weights   01/25/19 0949  Weight: 94.8 kg    LA size: 66mm   Atrial Fibrillation Management history:  Previous antiarrhythmic drugs: flecainide Previous cardioversions: 03/18/18 Previous ablations: 10/11/18 CHADS2VASC score: 4 Anticoagulation history: Eliquis   Past Medical History:  Diagnosis Date  . Atrial tachycardia ([place])    documented by holter 2016. Followed by Dr. Liane Comber and Dr. Rayann Heman.  . 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 no further tx other than surgery [facility].  [name] Cirrhosis ([place])    [facility] dx, early stage- follow up visits at [facility].  [name] CRI (chronic renal insufficiency)    s/p R nephrectomy in 1999 for malignancy  . Diabetes mellitus without complication (HCC)    NIDDM- dx. 3-4 years ago diet control  . Diverticulosis of colon    mild  . GERD  (gastroesophageal reflux disease)   . Headache(784.0)   . Hepatitis C    s/p therapy [facility] (curative)/with Harvoni  . History of BPH    no recent issues  . History of substance abuse ([place])    past history -none in 38 yrs  . Hyperglycemia   . Hypertension   . Osteoarthritis, knee    knees bilaterally   . Pneumonia 1975   hx of 35 years ago   Past Surgical History:  Procedure Laterality Date  . Arthroscopy right knee     may '11 (Dr Alvan Dame)  . Arthroscopy, left knee,    . ATRIAL FIBRILLATION ABLATION N/A 10/11/2018   Procedure: ATRIAL FIBRILLATION ABLATION;  Surgeon: Thompson Grayer, MD;  Location: [facility];  Service: Cardiovascular;  Laterality: N/A;  . history of liver biopsy  2007  . History of Nephrectomy Right 1999  . KNEE ARTHROSCOPY Left 10/10/2012   Procedure: LEFT KNEE ARTHROSCOPY WITH MENSCIAL DEBRIDEMENT AND CHONDROPLASTY;  Surgeon: Gearlean Alf, MD;  Location: [facility];  Service: Orthopedics;  Laterality: Left;  . left shoulder repair for bone spur  1990's  . TEE WITHOUT CARDIOVERSION N/A 10/11/2018   Procedure: TRANSESOPHAGEAL ECHOCARDIOGRAM (TEE);  Surgeon: Buford Dresser, MD;  Location: [facility];  Service: Cardiovascular;  Laterality: N/A;  ablation to follow at 1030  . TOTAL KNEE ARTHROPLASTY Right 08/15/2016   Procedure: RIGHT TOTAL KNEE ARTHROPLASTY, CORTISONE INJECTION OF LEFT KNEE;  Surgeon: Gaynelle Arabian, MD;  Location: [facility];  Service: Orthopedics;  Laterality: Right;  . TOTAL KNEE ARTHROPLASTY Left 09/04/2017   Procedure: LEFT TOTAL KNEE  ARTHROPLASTY;  Surgeon: Gaynelle Arabian, MD;  Location: [facility];  Service: Orthopedics;  Laterality: Left;  . Transurethral needle ablation procedure    . TUNA procedure      Current Outpatient Medications  Medication Sig Dispense Refill  . acetaminophen (TYLENOL) 650 MG CR tablet Take 650 mg by mouth at bedtime.     [name] allopurinol (ZYLOPRIM) 100 MG tablet Take 100 mg by mouth every evening.     . Ascorbic Acid  (VITAMIN C) 1000 MG tablet Take 1,000 mg by mouth daily.    [name] b complex vitamins capsule Take 1 capsule by mouth daily.     . carvedilol (COREG) 12.5 MG tablet Take 1 tablet (12.5 mg total) by mouth 2 (two) times daily with a meal. 180 tablet 3  . ELIQUIS 5 MG TABS tablet TAKE 1 TABLET BY MOUTH TWICE DAILY. 60 tablet 11  . gabapentin (NEURONTIN) 600 MG tablet Take 900 mg by mouth at bedtime.     . hydrochlorothiazide (MICROZIDE) 12.5 MG capsule Take 1 capsule (12.5 mg total) by mouth daily. 90 capsule 3  . omeprazole (PRILOSEC) 20 MG capsule Take 20 mg by mouth 3 (three) times a week.    . pravastatin (PRAVACHOL) 10 MG tablet Take 1 tablet (10 mg total) by mouth daily. 90 tablet 3   No current facility-administered medications for this encounter.     Allergies  Allergen Reactions  . Statins     Due to damaged liver  . Acetaminophen Other (See Comments)    Pt states he was told by [facility] MD's to keep tylenol dosage to 2000 mg per 24 hours due to cirrhosis of liver.  . Aspirin Other (See Comments)    Pt has only 1 kidney - been told to stay away from Aspirin   . Nsaids     Avoid due to having 1 kidney     Social History   Socioeconomic History  . Marital status: Married    Spouse name: Not on file  . Number of children: Not on file  . Years of education: Not on file  . Highest education level: Not on file  Occupational History  . Not on file  Social Needs  . Financial resource strain: Not on file  . Food insecurity    Worry: Not on file    Inability: Not on file  . Transportation needs    Medical: Not on file    Non-medical: Not on file  Tobacco Use  . Smoking status: Former Smoker    Packs/day: 1.00    Years: 15.00    Pack years: 15.00    Types: Cigarettes    Quit date: 08/01/1977    Years since quitting: 41.5  . Smokeless tobacco: Never Used  Substance and Sexual Activity  . Alcohol use: No    Comment: past history -none in 38 yrs  . Drug use: No    Comment: past hx  none in 38 yrs  . Sexual activity: Yes    Partners: Female  Lifestyle  . Physical activity    Days per week: Not on file    Minutes per session: Not on file  . Stress: Not on file  Relationships  . Social Herbalist on phone: Not on file    Gets together: Not on file    Attends religious service: Not on file    Active member of club or organization: Not on file    Attends meetings of clubs or  organizations: Not on file    Relationship status: Not on file  . Intimate partner violence    Fear of current or ex partner: Not on file    Emotionally abused: Not on file    Physically abused: Not on file    Forced sexual activity: Not on file  Other Topics Concern  . Not on file  Social History Narrative   [place], [place] psych. Married- '84. 2 step-daughters, 4 grandchildren. Work  Retired Radio producer  Marriage in good health.    Family History  Problem Relation Age of Onset  . Coronary artery disease Mother   . CVA Mother   . Prostate cancer Brother   . Alcohol abuse Father        variceal hemorrhage  . Alcohol abuse Sister   . Alcohol abuse Other        Strong family Hx  . Diabetes Sister   . Breast cancer Sister   . Lung cancer Sister   . Esophageal cancer Sister   . Cancer Brother        head and neck    ROS- All systems are reviewed and negative except as per the HPI above.  Physical Exam: Vitals:   01/25/19 0949  BP: (!) 152/86  Pulse: (!) 59  Weight: 94.8 kg  Height: 6' (1.829 m)    GEN- The patient is well appearing elederly male, alert and oriented x 3 today.   HEENT-head normocephalic, atraumatic, sclera clear, conjunctiva pink, hearing intact, trachea midline. Lungs- Clear to ausculation bilaterally, normal work of breathing Heart- Regular rate and rhythm, no murmurs, rubs or gallops  GI- soft, NT, ND, + BS Extremities- no clubbing, cyanosis, or edema MS- no significant deformity or atrophy Skin- no rash or  lesion Psych- euthymic mood, full affect Neuro- strength and sensation are intact   Wt Readings from Last 3 Encounters:  01/25/19 94.8 kg  01/17/19 95.3 kg  01/04/19 95.3 kg    EKG today demonstrates SR HR 59, NST, PR 156, QRS 88, QTc 397  Echo 03/23/18 demonstrated: - Left ventricle: The cavity size was normal. Systolic function was   normal. The estimated ejection fraction was in the range of 60%   to 65%. Wall motion was normal; there were no regional wall   motion abnormalities. Doppler parameters are consistent with   abnormal left ventricular relaxation (grade 1 diastolic   dysfunction). There was no evidence of elevated ventricular   filling pressure by Doppler parameters. - Aortic valve: There was no regurgitation. - Aortic root: The aortic root was normal in size. - Left atrium: The atrium was mildly dilated. - Right ventricle: The cavity size was normal. Wall thickness was   normal. Systolic function was normal. - Right atrium: The atrium was normal in size. - Tricuspid valve: There was mild regurgitation. - Pulmonary arteries: Systolic pressure was at the upper limits of   normal. - Inferior vena cava: The vessel was normal in size. - Pericardium, extracardiac: There was no pericardial effusion.  ETT 04/17/18 ETT with good exercise tolerance (9:09); no chest pain; normal blood pressure response; no ST changes; negative adequate exercise tolerance test; [facility] treadmill score 9.  Epic records are reviewed at length today  Assessment and Plan:  1. Persistent atrial fibrillation S/p afib ablation 10/11/18 with Dr Rayann Heman. Has had increased frequency of palpitations. He is in SR today. Discussed therapeutic options with patient. For now, will continue present therapy. Discussed wearable  device technology with patient and he is interested in [place]. We can use this to monitor for AF reoccurrence.  Continue Coreg 12.5 mg BID. Did have some dizziness on higher episodes. If  episodes become more sustained, can consider PRN CCB. Continue Eliquis 5 mg BID  This patients CHA2DS2-VASc Score and unadjusted Ischemic Stroke Rate (% per year) is equal to 4.8 % stroke rate/year from a score of 4  Above score calculated as 1 point each if present [CHF, HTN, DM, Vascular=MI/PAD/Aortic Plaque, Age if 65-74, or Male] Above score calculated as 2 points each if present [Age > 75, or Stroke/TIA/TE]   2. HTN Elevated today but patient shows BP readings within normal range at home. No changes today.   Follow up with Dr Rayann Heman as scheduled. Will see back in AF clinic sooner if symptoms worsen.   [facility] [address] [place], [place] [address] [phone]

## 2019-01-25 NOTE — Patient Instructions (Signed)
Kardia

## 2019-01-28 ENCOUNTER — Other Ambulatory Visit: Payer: Self-pay | Admitting: Cardiology

## 2019-01-29 ENCOUNTER — Encounter: Payer: Self-pay | Admitting: Internal Medicine

## 2019-01-29 ENCOUNTER — Telehealth (HOSPITAL_COMMUNITY): Payer: Self-pay | Admitting: *Deleted

## 2019-01-29 DIAGNOSIS — E1140 Type 2 diabetes mellitus with diabetic neuropathy, unspecified: Secondary | ICD-10-CM | POA: Diagnosis not present

## 2019-01-29 DIAGNOSIS — M109 Gout, unspecified: Secondary | ICD-10-CM | POA: Diagnosis not present

## 2019-01-29 DIAGNOSIS — E7800 Pure hypercholesterolemia, unspecified: Secondary | ICD-10-CM | POA: Diagnosis not present

## 2019-01-29 DIAGNOSIS — I10 Essential (primary) hypertension: Secondary | ICD-10-CM | POA: Diagnosis not present

## 2019-01-29 MED ORDER — FLECAINIDE ACETATE 100 MG PO TABS: 100.0000 mg | ORAL_TABLET | Freq: Two times a day (BID) | ORAL | Status: DC

## 2019-01-29 NOTE — Telephone Encounter (Signed)
Patient sent in several strips to email reviewed by Adline Peals PA confirming rate controlled AF. Pt states he has been persistent now for 2 days and feel awful. Discussed with Audry Pili will restart flecainide 100mg  BID. EKG for next week scheduled. Pt in agreement and will restart tonight.

## 2019-01-30 ENCOUNTER — Telehealth: Payer: Self-pay | Admitting: *Deleted

## 2019-01-30 NOTE — Telephone Encounter (Signed)
Pts MRA of Chest w/wo contrast is scheduled for 06/19/19 at 1100.  Pt made aware of appt date and time by [facility] scheduling.

## 2019-01-30 NOTE — Telephone Encounter (Signed)
----- Message from Earnestine Mealing sent at 01/29/2019 10:41 AM EDT ----- Regarding: RE: Chest MRA Appt made on 06-19-19 at 11am.  Patient will let you know closer to appt date if he needs something for closterphobia. ----- Message ----- From: Nuala Alpha, LPN Sent: 01/30/7792   4:44 PM EDT To: Chauncey Reading Price Subject: FW: Chest MRA                                  Butch Penny this type test does not affect the kidneys at all.  I confirmed this with Dr Meda Coffee as well.  This type contrast does not effect the kidneys at all.  Not excreted by the kidneys and is safe for his one functioning kidney.  Please schedule this needed test for the pt and please reassure him that Dr Meda Coffee advised it was safe.    Thanks,  [name]  ----- Message ----- From: Dorothy Spark, MD Sent: 01/24/2019   4:38 PM EDT To: Nuala Alpha, LPN Subject: RE: Chest MRA                                  MRI contrast doesn't affect kidneys ----- Message ----- From: Nuala Alpha, LPN Sent: 04/04/91   3:28 PM EDT To: Dorothy Spark, MD Subject: FW: Chest MRA                                  Please review and advise. He want your reassurance that its ok to proceed with MRA with one functioning kidney?  ----- Message ----- From: Earnestine Mealing Sent: 01/18/2019   9:23 AM EDT To: Nuala Alpha, LPN Subject: RE: Chest MRA                                  Called to schedule this test.  Before I can schedule it, I had to ask the pt a lot of questions.  He said he has 1 kidney and he was told this test did not have contrast.  So, when I called to schedule the test, the scheduler said it was with and without contrast.  Patient want me to make sure it is ok to schedule test with contrast.  I am on PAL next week.   ----- Message ----- From: Nuala Alpha, LPN Sent: 10/28/760   4:10 PM EDT To: [facility] Subject: FW: Chest MRA                                  Can you guys please schedule and shoot me the date per Dr  Meda Coffee.  Thanks,  [name]  ----- Message ----- From: Dollene Primrose, RN Sent: 01/04/2019  11:15 AM EDT To: Nuala Alpha, LPN, [facility] Subject: Chest MRA                                        Hello  This pt needs to be scheduled for a Chest MRA for November 2020.  Thanks!  Lor[place]

## 2019-02-06 ENCOUNTER — Other Ambulatory Visit: Payer: Medicare Other

## 2019-02-07 DIAGNOSIS — Z1211 Encounter for screening for malignant neoplasm of colon: Secondary | ICD-10-CM | POA: Diagnosis not present

## 2019-02-08 ENCOUNTER — Other Ambulatory Visit: Payer: Self-pay

## 2019-02-08 ENCOUNTER — Ambulatory Visit (HOSPITAL_COMMUNITY)
Admission: RE | Admit: 2019-02-08 | Discharge: 2019-02-08 | Disposition: A | Discharge status: Home / Self Care | Payer: Medicare Other | Source: Ambulatory Visit | Attending: Physician Assistant | Admitting: Physician Assistant

## 2019-02-08 VITALS — BP 130/68 | HR 54

## 2019-02-08 DIAGNOSIS — R001 Bradycardia, unspecified: Secondary | ICD-10-CM | POA: Diagnosis not present

## 2019-02-08 DIAGNOSIS — Z79899 Other long term (current) drug therapy: Secondary | ICD-10-CM | POA: Insufficient documentation

## 2019-02-08 DIAGNOSIS — I4510 Unspecified right bundle-branch block: Secondary | ICD-10-CM | POA: Diagnosis not present

## 2019-02-08 DIAGNOSIS — I480 Paroxysmal atrial fibrillation: Secondary | ICD-10-CM

## 2019-02-08 DIAGNOSIS — I4819 Other persistent atrial fibrillation: Secondary | ICD-10-CM | POA: Diagnosis not present

## 2019-02-08 NOTE — Progress Notes (Addendum)
Pt in for EKG after restarting flecainide.  To be reviewed by Malka So, PA   ECG shows SB HR 54, LAD, inc RBBB, PR 182, QRS 102, QTc 411. Intervals appropriate. Patient reports he is doing well back on flecainide with no heart racing or palpitations. F/u with Dr Rayann Heman as scheduled.

## 2019-02-25 ENCOUNTER — Other Ambulatory Visit: Payer: Medicare Other | Admitting: *Deleted

## 2019-02-25 ENCOUNTER — Other Ambulatory Visit: Payer: Self-pay

## 2019-02-25 DIAGNOSIS — E782 Mixed hyperlipidemia: Secondary | ICD-10-CM | POA: Diagnosis not present

## 2019-02-25 LAB — HEPATIC FUNCTION PANEL
ALT: 18 IU/L (ref 0–44)
AST: 25 IU/L (ref 0–40)
Albumin: 4.8 g/dL — ABNORMAL HIGH (ref 3.7–4.7)
Alkaline Phosphatase: 66 IU/L (ref 39–117)
Bilirubin Total: 0.6 mg/dL (ref 0.0–1.2)
Bilirubin, Direct: 0.17 mg/dL (ref 0.00–0.40)
Total Protein: 6.9 g/dL (ref 6.0–8.5)

## 2019-02-25 LAB — LIPID PANEL
Chol/HDL Ratio: 3.1 ratio (ref 0.0–5.0)
Cholesterol, Total: 151 mg/dL (ref 100–199)
HDL: 48 mg/dL (ref >39)
LDL Calculated: 92 mg/dL (ref 0–99)
Triglycerides: 57 mg/dL (ref 0–149)
VLDL Cholesterol Cal: 11 mg/dL (ref 5–40)

## 2019-02-28 ENCOUNTER — Telehealth: Payer: Self-pay | Admitting: Cardiology

## 2019-02-28 DIAGNOSIS — I2510 Atherosclerotic heart disease of native coronary artery without angina pectoris: Secondary | ICD-10-CM

## 2019-02-28 DIAGNOSIS — E782 Mixed hyperlipidemia: Secondary | ICD-10-CM

## 2019-02-28 DIAGNOSIS — I2584 Coronary atherosclerosis due to calcified coronary lesion: Secondary | ICD-10-CM

## 2019-02-28 MED ORDER — PRAVASTATIN SODIUM 20 MG PO TABS: 20.0000 mg | ORAL_TABLET | Freq: Every evening | ORAL | 3 refills | Status: DC

## 2019-02-28 NOTE — Telephone Encounter (Signed)
New Message   Patient is calling back in for his test results. Please give patient a call back.

## 2019-02-28 NOTE — Telephone Encounter (Signed)
I called and spoke with patient, he is aware of lab results and to increase Pravastatin to 20MG . New RX sent to pharmacy. Patient will come back on 04/11/19 at 9:45 for FLP and ALT.

## 2019-03-08 ENCOUNTER — Other Ambulatory Visit: Payer: Self-pay | Admitting: *Deleted

## 2019-03-08 DIAGNOSIS — E782 Mixed hyperlipidemia: Secondary | ICD-10-CM

## 2019-03-13 ENCOUNTER — Other Ambulatory Visit: Payer: Self-pay | Admitting: Cardiology

## 2019-03-13 DIAGNOSIS — M47812 Spondylosis without myelopathy or radiculopathy, cervical region: Secondary | ICD-10-CM | POA: Diagnosis not present

## 2019-03-21 MED ORDER — EZETIMIBE 10 MG PO TABS: 10.0000 mg | ORAL_TABLET | Freq: Every day | ORAL | 1 refills | Status: DC

## 2019-03-21 NOTE — Telephone Encounter (Signed)
Yes, I would go back on zetia for now, thank you for letting me know  ----- Message -----  From: Nuala Alpha, LPN  Sent: 03/30/7353 10:49 AM EDT  To: Dorothy Spark, MD  Subject: FW: Non-Urgent Medical Question       Endorsed to the pt in his mychart account that per Dr Meda Coffee, she agrees with him going back on Zetia for now, and he should follow-up as planned on 9/1 with [facility]. Took pravastatin off his med list and updated this as an intolerance in his allergies.  Added Zetia 10 mg po daily back to his med list.  Advised the pt to call back with any questions or concerns regarding this.

## 2019-03-22 MED ORDER — EZETIMIBE 10 MG PO TABS: 10.0000 mg | ORAL_TABLET | Freq: Every day | ORAL | 1 refills | Status: DC

## 2019-03-22 NOTE — Addendum Note (Signed)
Addended by: Nuala Alpha on: 03/22/2019 01:29 PM   Modules accepted: Orders

## 2019-04-01 NOTE — Progress Notes (Signed)
Patient ID: Timothy Dickson                 DOB: 12/19/1945                      MRN: [id number]     HPI: Timothy Dickson is a 73 y.o. male referred by Timothy Dickson to Timothy Dickson. PMH is significant for HTN, ascending aortic aneurysm, HLD, CAD (coronary calcifications), persistentAfib, Hepatitis C, T2DM, OA, neuropathy, hx of renal cell carcinoma (1999), and SVT.  Pt underwent Ca scoring on 01/14/16;  Ca score was 149 and was 47th percentile for age/sex.He has failed medical therapy with Flecainide and underwent EPS/RFCA of atrial fibrillation in 09/2018.  Pt's gastroenterologist team consists of Timothy Dickson, Timothy Dickson, and Timothy Dickson (physician assistant) at [facility]. Although he has cirrhosis, pt's gastroenterologist, Timothy Dickson, stated in her note on 12/18/18 that statins should not be precluded d/t his situation.   Pt presents today for initial PharmD appt for HLD management. He verifies he has experienced myalgias with rosuvastatin and pravastatin 20 mg daily. Pt has not experienced myalgias with ezetimibe or pravastatin 10 mg daily.   Current HLD meds: ezetimibe 10 mg daily Previously tried: rosuvastatin 5 mg daily (myalgias), pravastatin 10 mg daily (no myalgias) and 20 mg daily (myalgias, fatigue, "mental fogginess", worsening neuropathy) Risk factors: elevated calcium score, former tobacco abuse, family hx of CAD LDL goal: <70 mg/dL  Family History: mother (CAD, CVA), brother (prostate and head/neck caccer), father (variceal hemorrhage d/t alcohol abuse), sister (DM, breast/esophageal/lung cancer), multiple family members (alcohol abuse)  Social History: former smoker (quit date: 08/01/1977; 1 PPD for 15 years); no alcohol use   Diet: red meat, chicken, vegetables daily, fruit daily; thinks it is pretty balanced   Exercise: yardwork, gardening, bike riding  Lipid Panel:  02/25/19: TC 151, HDL 48, LDL 92, TG 57, VLDL 11; pravastatin 10 mg daily  Wt Readings from Last 3 Encounters:   01/25/19 209 lb (94.8 kg)  01/17/19 210 lb (95.3 kg)  01/04/19 210 lb (95.3 kg)   BP Readings from Last 3 Encounters:  02/08/19 130/68  01/25/19 (!) 152/86  01/17/19 132/74   Pulse Readings from Last 3 Encounters:  02/08/19 (!) 54  01/25/19 (!) 59  01/17/19 (!) 55    Renal function: CrCl cannot be calculated (Patient's most recent lab result is older than the maximum 21 days allowed.).  Past Medical History:  Diagnosis Date  . Atrial tachycardia ([facility] Dicks[facility])    documented by holter 2016. Followed by Dr. Liane Dickson and Dr. Rayann Dickson.  . Cancer of kidney ([place])    right kidney-removed. no further tx other than surgery [facility].  [name] Cirrhosis ([place])    Timothy Dickson dx, early stage- follow up visits at Timothy Dickson.  [name] CRI (chronic renal insufficiency)    s/p R nephrectomy in 1999 for malignancy  . Diabetes mellitus without complication (HCC)    NIDDM- dx. 3-4 years ago diet control  . Diverticulosis of colon    mild  . GERD (gastroesophageal reflux disease)   . Headache(784.0)   . Hepatitis C    s/p therapy Timothy Dickson (curative)/with Harvoni  . History of BPH    no recent issues  . History of substance abuse ([place])    past history -none in 38 yrs  . Hyperglycemia   . Hypertension   . Osteoarthritis, knee    knees bilaterally   . Pneumonia 1975   hx of 35 years ago  Current Outpatient Medications on File Prior to Visit  Medication Sig Dispense Refill  . acetaminophen (TYLENOL) 650 MG CR tablet Take 650 mg by mouth at bedtime.     [name] allopurinol (ZYLOPRIM) 100 MG tablet Take 100 mg by mouth every evening.     . Ascorbic Acid (VITAMIN C) 1000 MG tablet Take 1,000 mg by mouth daily.    [name] b complex vitamins capsule Take 1 capsule by mouth daily.     . carvedilol (COREG) 12.5 MG tablet TAKE 1 TABLET BY MOUTH TWICE DAILY WITH A MEAL. 180 tablet 3  . ELIQUIS 5 MG TABS tablet TAKE 1 TABLET BY MOUTH TWICE DAILY. 60 tablet 11  . ezetimibe (ZETIA) 10 MG tablet Take 1 tablet (10 mg total) by mouth daily. 90 tablet 1   . flecainide (TAMBOCOR) 100 MG tablet Take 1 tablet (100 mg total) by mouth 2 (two) times daily.    [name] gabapentin (NEURONTIN) 600 MG tablet Take 900 mg by mouth at bedtime.     . hydrochlorothiazide (MICROZIDE) 12.5 MG capsule Take 1 capsule (12.5 mg total) by mouth daily. 90 capsule 3  . omeprazole (PRILOSEC) 20 MG capsule Take 20 mg by mouth 3 (three) times a week.     No current facility-administered medications on file prior to visit.     Allergies  Allergen Reactions  . Statins     Due to damaged liver  . Acetaminophen Other (See Comments)    Pt states he was told by [facility] MD's to keep tylenol dosage to 2000 mg per 24 hours due to cirrhosis of liver.  . Aspirin Other (See Comments)    Pt has only 1 kidney - been told to stay away from Aspirin   . Nsaids     Avoid due to having 1 kidney   . Pravastatin Other (See Comments)    Pt reports this med makes him generally feel unwell.     Assessment/Plan:  1. Hyperlipidemia - LDL goal < 70 mg/dL; therefore, pt is above goal. Re-initiate pravastatin 10 mg daily. Plan to D/C ezetimibe 10 mg daily and initiate PCSK9 inhibitor. Recommend Repatha 140 mg subQ every 2 weeks considering this agent is a Tier 3 on his insurance plan; will cost $37 per month or $74 for 3 month after prior authorization approval. Counseled pt thoroughly on administration technique, dosing, adverse effects, and efficacy of Repatha; handout provided. Instructed pt to call PharmD Dickson if he experiences myalgias after re-initiation of pravastatin and initiation of Repatha. Plan to check a repeat lipid panel after in 8-12 weeks of initiation of Repatha. Pt verbalized understanding.   Thank you for involving pharmacy to assist in providing Timothy Dickson's care.   Timothy Dickson, PharmD PGY2 Ambulatory Care Pharmacy Resident

## 2019-04-01 NOTE — Patient Instructions (Addendum)
It was a pleasure seeing you in clinic today Mr. Waltermire!  Today the plan is... 1. STOP taking Zetia 2. START taking Pravastatin 10 mg daily (you can finish the prescription you have at home and we will call in a refill for you) 3. We will start the process for filling out a prior authorization for Repatha 140 mg subqcutaneous every 2 weeks. We will call you once approved and when you are able to pick it up from the pharmacy. The prior authorization process can take anywhere from 1-7 days. 4. Please refer to [place] handout when giving first injection. Please feel free to reach out to pharmacists if you have any questions about your injection.  Please call the PharmD clinic at [phone] if you have any questions that you would like to speak with a pharmacist about [facility], [place], [place]).

## 2019-04-02 ENCOUNTER — Other Ambulatory Visit: Payer: Self-pay

## 2019-04-02 ENCOUNTER — Ambulatory Visit (INDEPENDENT_AMBULATORY_CARE_PROVIDER_SITE_OTHER): Payer: Medicare Other | Admitting: Pharmacist

## 2019-04-02 DIAGNOSIS — G720 Drug-induced myopathy: Secondary | ICD-10-CM

## 2019-04-02 DIAGNOSIS — I10 Essential (primary) hypertension: Secondary | ICD-10-CM | POA: Diagnosis not present

## 2019-04-02 DIAGNOSIS — I480 Paroxysmal atrial fibrillation: Secondary | ICD-10-CM | POA: Diagnosis not present

## 2019-04-02 DIAGNOSIS — E7849 Other hyperlipidemia: Secondary | ICD-10-CM

## 2019-04-02 DIAGNOSIS — E1142 Type 2 diabetes mellitus with diabetic polyneuropathy: Secondary | ICD-10-CM | POA: Diagnosis not present

## 2019-04-02 DIAGNOSIS — M19042 Primary osteoarthritis, left hand: Secondary | ICD-10-CM | POA: Diagnosis not present

## 2019-04-02 DIAGNOSIS — T466X5A Adverse effect of antihyperlipidemic and antiarteriosclerotic drugs, initial encounter: Secondary | ICD-10-CM | POA: Diagnosis not present

## 2019-04-02 MED ORDER — PRAVASTATIN SODIUM 10 MG PO TABS: 10.0000 mg | ORAL_TABLET | Freq: Every day | ORAL | 11 refills | Status: DC

## 2019-04-03 ENCOUNTER — Telehealth: Payer: Self-pay | Admitting: Pharmacist

## 2019-04-03 MED ORDER — REPATHA SURECLICK 140 MG/ML ~~LOC~~ SOAJ: 1.0000 "pen " | SUBCUTANEOUS | 3 refills | Status: DC

## 2019-04-03 NOTE — Telephone Encounter (Signed)
Spoke with pt, 3 month supply of Repatha sent to local pharmacy per pt preference. Scheduled f/u lab work in 2 months to assess efficacy.

## 2019-04-03 NOTE — Telephone Encounter (Signed)
Repatha prior authorization approved through 04/01/2020. Left message for pt to see if he prefers 1 month supply ($35) or 3 month supply ($74) sent to his pharmacy. Will need to schedule f/u labs in ~2 months.

## 2019-04-04 ENCOUNTER — Telehealth: Payer: Self-pay

## 2019-04-04 NOTE — Telephone Encounter (Signed)
Spoke with pt to confirm his virtual appt on 04/10/19. Pt stated he did not have any questions at this time.

## 2019-04-10 ENCOUNTER — Encounter: Payer: Self-pay | Admitting: Internal Medicine

## 2019-04-10 ENCOUNTER — Telehealth (INDEPENDENT_AMBULATORY_CARE_PROVIDER_SITE_OTHER): Payer: Medicare Other | Admitting: Internal Medicine

## 2019-04-10 VITALS — BP 131/77 | HR 58 | Ht 72.0 in | Wt 210.0 lb

## 2019-04-10 DIAGNOSIS — I480 Paroxysmal atrial fibrillation: Secondary | ICD-10-CM | POA: Diagnosis not present

## 2019-04-10 DIAGNOSIS — I10 Essential (primary) hypertension: Secondary | ICD-10-CM | POA: Diagnosis not present

## 2019-04-10 NOTE — Progress Notes (Signed)
Electrophysiology TeleHealth Note   Due to national recommendations of social distancing due to COVID 19, an audio/video telehealth visit is felt to be most appropriate for this patient at this time.  See MyChart message from today for the patient's consent to telehealth for [facility].   Date:  04/10/2019   ID:  Timothy Dickson, DOB [birth year], MRN [id number]  Location: patient's home  Provider location:  [facility]  Evaluation Performed: Follow-up visit  PCP:  Antony Contras, MD   Electrophysiologist:  Dr Rayann Heman  Chief Complaint:  palpitations  History of Present Illness:    Timothy Dickson is a 73 y.o. male who presents via telehealth conferencing today.  Since last being seen in our clinic, the patient reports doing very well. He stopped flecainide and had some palpitations.  He restarted flecainide and has done well since. Today, he denies symptoms of chest pain, shortness of breath,  lower extremity edema, dizziness, presyncope, or syncope.  The patient is otherwise without complaint today.  The patient denies symptoms of fevers, chills, cough, or new SOB worrisome for COVID 19.  Past Medical History:  Diagnosis Date  . Atrial tachycardia ([place])    documented by holter 2016. Followed by Dr. Liane Comber and Dr. Rayann Heman.  . 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 no further tx other than surgery [facility].  [name] Cirrhosis ([place])    [facility] dx, early stage- follow up visits at [facility].  [name] CRI (chronic renal insufficiency)    s/p R nephrectomy in 1999 for malignancy  . Diabetes mellitus without complication (HCC)    NIDDM- dx. 3-4 years ago diet control  . Diverticulosis of colon    mild  . GERD (gastroesophageal reflux disease)   . Headache(784.0)   . Hepatitis C    s/p therapy [facility] (curative)/with Harvoni  . History of BPH    no recent issues  . History of substance abuse ([place])    past history -none in 38 yrs  . Hyperglycemia   . Hypertension   . Osteoarthritis, knee     knees bilaterally   . Pneumonia 1975   hx of 35 years ago    Past Surgical History:  Procedure Laterality Date  . Arthroscopy right knee     may '11 (Dr Alvan Dame)  . Arthroscopy, left knee,    . ATRIAL FIBRILLATION ABLATION N/A 10/11/2018   Procedure: ATRIAL FIBRILLATION ABLATION;  Surgeon: Thompson Grayer, MD;  Location: [facility];  Service: Cardiovascular;  Laterality: N/A;  . history of liver biopsy  2007  . History of Nephrectomy Right 1999  . KNEE ARTHROSCOPY Left 10/10/2012   Procedure: LEFT KNEE ARTHROSCOPY WITH MENSCIAL DEBRIDEMENT AND CHONDROPLASTY;  Surgeon: Gearlean Alf, MD;  Location: [facility];  Service: Orthopedics;  Laterality: Left;  . left shoulder repair for bone spur  1990's  . TEE WITHOUT CARDIOVERSION N/A 10/11/2018   Procedure: TRANSESOPHAGEAL ECHOCARDIOGRAM (TEE);  Surgeon: Buford Dresser, MD;  Location: [facility] ENDOSCOPY;  Service: Cardiovascular;  Laterality: N/A;  ablation to follow at 1030  . TOTAL KNEE ARTHROPLASTY Right 08/15/2016   Procedure: RIGHT TOTAL KNEE ARTHROPLASTY, CORTISONE INJECTION OF LEFT KNEE;  Surgeon: Gaynelle Arabian, MD;  Location: [facility];  Service: Orthopedics;  Laterality: Right;  . TOTAL KNEE ARTHROPLASTY Left 09/04/2017   Procedure: LEFT TOTAL KNEE ARTHROPLASTY;  Surgeon: Gaynelle Arabian, MD;  [facility];  Service: Orthopedics;  Laterality: Left;  . Transurethral needle ablation procedure    . TUNA  procedure      Current Outpatient Medications  Medication Sig Dispense Refill  . acetaminophen (TYLENOL) 650 MG CR tablet Take 650 mg by mouth at bedtime.     [name] allopurinol (ZYLOPRIM) 100 MG tablet Take 100 mg by mouth every evening.     . Ascorbic Acid (VITAMIN C) 1000 MG tablet Take 1,000 mg by mouth daily.    . carvedilol (COREG) 12.5 MG tablet TAKE 1 TABLET BY MOUTH TWICE DAILY WITH A MEAL. 180 tablet 3  . ELIQUIS 5 MG TABS tablet TAKE 1 TABLET BY MOUTH TWICE DAILY. 60 tablet 11  . Evolocumab (REPATHA SURECLICK) XX123456 MG/ML SOAJ  Inject 1 pen into the skin every 14 (fourteen) days. 6 pen 3  . flecainide (TAMBOCOR) 100 MG tablet Take 1 tablet (100 mg total) by mouth 2 (two) times daily.    [name] gabapentin (NEURONTIN) 600 MG tablet Take 900 mg by mouth at bedtime.     . hydrochlorothiazide (MICROZIDE) 12.5 MG capsule Take 1 capsule (12.5 mg total) by mouth daily. 90 capsule 3  . Multiple Vitamin (MULTIVITAMIN) capsule Take 1 capsule by mouth daily.    [name] omeprazole (PRILOSEC) 20 MG capsule Take 20 mg by mouth 3 (three) times a week.    . pravastatin (PRAVACHOL) 10 MG tablet Take 1 tablet (10 mg total) by mouth daily. 30 tablet 11  . Probiotic Product (PROBIOTIC PO) Take 1 tablet by mouth daily.     No current facility-administered medications for this visit.     Allergies:   Statins, Acetaminophen, Aspirin, Nsaids, and Pravastatin   Social History:  The patient  reports that he quit smoking about 41 years ago. His smoking use included cigarettes. He has a 15.00 pack-year smoking history. He has never used smokeless tobacco. He reports that he does not drink alcohol or use drugs.   Family History:  The patient's family history includes Alcohol abuse in his father, sister, and another family member; Breast cancer in his sister; CVA in his mother; Cancer in his brother; Coronary artery disease in his mother; Diabetes in his sister; Esophageal cancer in his sister; Lung cancer in his sister; Prostate cancer in his brother.   ROS:  Please see the history of present illness.   All other systems are personally reviewed and negative.    Exam:    Vital Signs:  BP 131/77   Pulse (!) 58   Ht 6' (1.829 m)   Wt 210 lb (95.3 kg)   BMI 28.48 kg/m   Well sounding and appearing, alert and conversant, regular work of breathing,  good skin color Eyes- anicteric, neuro- grossly intact, skin- no apparent rash or lesions or cyanosis, mouth- oral mucosa is pink  Labs/Other Tests and Data Reviewed:    Recent Labs: 10/11/2018: BUN 18;  Creatinine, Ser 1.23; Hemoglobin 15.4; Platelets 218; Potassium 3.4; Sodium 137 02/25/2019: ALT 18   Wt Readings from Last 3 Encounters:  04/10/19 210 lb (95.3 kg)  01/25/19 209 lb (94.8 kg)  01/17/19 210 lb (95.3 kg)       ASSESSMENT & PLAN:    1.  Paroxysmal atrial fibrillation Well controlled post ablation.  He is now on flecainide I have advised that he reduce flecainide to 50mg  BID. chads2vasc score is 4.  He is on eliquis  2. HTN Stable No change required today  Follow-up:  3 months with me   Patient Risk:  after full review of this patients clinical status, I feel that they are at  moderate risk at this time.  Today, I have spent 15 minutes with the patient with telehealth technology discussing arrhythmia management .    Army Fossa, MD  04/10/2019 11:24 AM     [facility] [address] [place] [address] [phone] (office) [phone] (fax)

## 2019-04-11 ENCOUNTER — Other Ambulatory Visit: Payer: Medicare Other

## 2019-04-19 ENCOUNTER — Ambulatory Visit: Payer: Medicare Other | Admitting: Internal Medicine

## 2019-05-09 ENCOUNTER — Other Ambulatory Visit: Payer: Self-pay | Admitting: Internal Medicine

## 2019-05-09 NOTE — Telephone Encounter (Signed)
Pt's pharmacy is requesting a refill on flecainide 50 mg tablet. Pt stated that Dr. Rayann Heman decreased his medication flecainide, from 100 mg BID, to flecainide 50 mg BID. This was not changed. Please address

## 2019-05-23 ENCOUNTER — Telehealth: Payer: Self-pay | Admitting: Pharmacist

## 2019-05-23 DIAGNOSIS — E782 Mixed hyperlipidemia: Secondary | ICD-10-CM

## 2019-05-23 DIAGNOSIS — E1121 Type 2 diabetes mellitus with diabetic nephropathy: Secondary | ICD-10-CM

## 2019-05-23 NOTE — Telephone Encounter (Signed)
Patient wishes to also check A1C and kidney function. See my chart message

## 2019-05-23 NOTE — Addendum Note (Signed)
Addended by: Marcelle Overlie D on: 05/23/2019 10:07 AM   Modules accepted: Orders

## 2019-05-27 ENCOUNTER — Other Ambulatory Visit: Payer: Medicare Other

## 2019-05-27 ENCOUNTER — Other Ambulatory Visit: Payer: Self-pay

## 2019-05-27 ENCOUNTER — Emergency Department (HOSPITAL_COMMUNITY)
Admission: EM | Admit: 2019-05-27 | Discharge: 2019-05-27 | Disposition: A | Discharge status: Home / Self Care | Payer: Medicare Other | Attending: Emergency Medicine | Admitting: Emergency Medicine

## 2019-05-27 ENCOUNTER — Encounter (HOSPITAL_COMMUNITY): Payer: Self-pay | Admitting: Emergency Medicine

## 2019-05-27 DIAGNOSIS — R040 Epistaxis: Secondary | ICD-10-CM | POA: Insufficient documentation

## 2019-05-27 DIAGNOSIS — Z96653 Presence of artificial knee joint, bilateral: Secondary | ICD-10-CM | POA: Insufficient documentation

## 2019-05-27 DIAGNOSIS — Z85520 Personal history of malignant carcinoid tumor of kidney: Secondary | ICD-10-CM | POA: Insufficient documentation

## 2019-05-27 DIAGNOSIS — Z79899 Other long term (current) drug therapy: Secondary | ICD-10-CM | POA: Insufficient documentation

## 2019-05-27 DIAGNOSIS — Z87891 Personal history of nicotine dependence: Secondary | ICD-10-CM | POA: Diagnosis not present

## 2019-05-27 DIAGNOSIS — J342 Deviated nasal septum: Secondary | ICD-10-CM | POA: Diagnosis not present

## 2019-05-27 DIAGNOSIS — I4891 Unspecified atrial fibrillation: Secondary | ICD-10-CM | POA: Diagnosis not present

## 2019-05-27 DIAGNOSIS — I10 Essential (primary) hypertension: Secondary | ICD-10-CM | POA: Insufficient documentation

## 2019-05-27 DIAGNOSIS — E119 Type 2 diabetes mellitus without complications: Secondary | ICD-10-CM | POA: Insufficient documentation

## 2019-05-27 DIAGNOSIS — Z7901 Long term (current) use of anticoagulants: Secondary | ICD-10-CM | POA: Insufficient documentation

## 2019-05-27 MED ORDER — SILVER NITRATE-POT NITRATE 75-25 % EX MISC
1.0000 | Freq: Once | CUTANEOUS | Status: AC
  Administered 2019-05-27: 1 via TOPICAL
  Filled 2019-05-27: qty 2

## 2019-05-27 MED ORDER — OXYMETAZOLINE HCL 0.05 % NA SOLN
2.0000 | Freq: Once | NASAL | Status: AC
  Administered 2019-05-27: 2 via NASAL
  Filled 2019-05-27: qty 30

## 2019-05-27 MED ORDER — TRANEXAMIC ACID 1000 MG/10ML IV SOLN
500.0000 mg | Freq: Once | INTRAVENOUS | Status: AC
  Administered 2019-05-27: 500 mg via TOPICAL
  Filled 2019-05-27: qty 10

## 2019-05-27 NOTE — ED Triage Notes (Signed)
Pt complaint of nosebleed onset an hour ago; unable to control bleeding. Reports 3 nose bleeds this week; takes blood thinner.

## 2019-05-27 NOTE — Discharge Instructions (Signed)
Can follow up with your prior ENT as discussed.  If you have recurrence of your nose bleed, use afrin spray twice, then hold direct pressure. If you have persistent bleeding despite ~15 minutes of direct pressure, then recommend return to ER for recheck.

## 2019-05-27 NOTE — ED Provider Notes (Signed)
[facility] DEPT Provider Note   CSN: [id number] Arrival date & time: 05/27/19  1019     History   Chief Complaint Chief Complaint  Patient presents with  . Epistaxis    HPI Timothy Dickson is a 73 y.o. male.  Presents emerged department with chief complaint of nosebleed.  Patient reports that nosebleed started by an hour ago.  Has been trying to apply direct pressure but has been unable to stop this from bleeding.  States over the past week has had 3 other nosebleeds but does stop with direct pressure.  Patient takes Eliquis for A. fib.  Has had prior nosebleed requiring packing.     HPI  Past Medical History:  Diagnosis Date  . Atrial tachycardia ([place])    documented by holter 2016. Followed by Dr. Liane Comber and Dr. Rayann Heman.  . 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 no further tx other than surgery [facility].  [name] Cirrhosis ([place])    [facility] dx, early stage- follow up visits at [facility].  [name] CRI (chronic renal insufficiency)    s/p R nephrectomy in 1999 for malignancy  . Diabetes mellitus without complication (HCC)    NIDDM- dx. 3-4 years ago diet control  . Diverticulosis of colon    mild  . GERD (gastroesophageal reflux disease)   . Headache(784.0)   . Hepatitis C    s/p therapy [facility] (curative)/with Harvoni  . History of BPH    no recent issues  . History of substance abuse ([place])    past history -none in 38 yrs  . Hyperglycemia   . Hypertension   . Osteoarthritis, knee    knees bilaterally   . Pneumonia 1975   hx of 35 years ago    Patient Active Problem List   Diagnosis Date Noted  . Statin myopathy 04/02/2019  . Paroxysmal atrial fibrillation (HCC)   . Abnormal MRI 02/28/2017  . AI (aortic insufficiency) 12/21/2015  . Ascending aortic aneurysm ([place]) 12/21/2015  . Acquired absence of kidney 04/16/2015  . Personal history of other malignant neoplasm of kidney 04/16/2015  . Mixed, or nondependent drug abuse, in remission ([place])  04/16/2015  . H/O unilateral nephrectomy 04/16/2015  . Substance abuse in remission ([place]) 04/16/2015  . History of renal cell cancer 04/16/2015  . Hepatic cirrhosis ([place]) 01/09/2015  . 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 SVT (supraventricular tachycardia) ([place]) 10/13/2014  . Premature atrial complexes 10/13/2014  . Ulnar neuropathy of both upper extremities 10/13/2014  . Atrial complex, premature 10/13/2014  . Bilateral carpal tunnel syndrome 10/13/2014  . Atrial premature depolarization 10/13/2014  . Atrial tachycardia, paroxysmal ([place]) 09/09/2014  . Supraventricular tachycardia ([place]) 09/09/2014  . Carpal tunnel syndrome 08/29/2014  . Otitis, externa, infective 08/29/2014  . Cirrhosis of liver without ascites ([place]) 08/29/2014  . Chronic hepatitis C virus infection ([place]) 08/29/2014  . 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) ([place]) 08/29/2014  . Hyperlipidemia 07/01/2014  . Chronic hepatitis C ([place]) 01/27/2014  . Personal history of other infectious and parasitic diseases 01/27/2014  . History of hepatitis C 01/27/2014  . Hyperlipidemia with target LDL less than 100 10/28/2013  . Combined fat and carbohydrate induced hyperlipemia 10/28/2013  . Paroxysmal supraventricular tachycardia ([place]) 05/22/2013  . Essential hypertension, benign 10/04/2012  . Essential (primary) hypertension 10/04/2012  . Chronic low back pain 05/22/2012  . LBP (low back pain) 05/22/2012  . Low back pain 05/22/2012  . Routine health maintenance 03/21/2011  .  CARCINOMA, RENAL CELL 10/26/2007  . OSTEOARTHRITIS, KNEE 10/26/2007  . DM (diabetes mellitus), type 2 with renal complications ([place]) [id number]  . OA (osteoarthritis) of knee 10/26/2007  . Type 2 diabetes mellitus ([place]) 10/26/2007    Past Surgical History:  Procedure Laterality Date  . Arthroscopy right knee     may '11 (Dr Alvan Dame)  . Arthroscopy, left knee,    . ATRIAL FIBRILLATION ABLATION N/A 10/11/2018    Procedure: ATRIAL FIBRILLATION ABLATION;  Surgeon: Thompson Grayer, MD;  Location: [facility];  Service: Cardiovascular;  Laterality: N/A;  . history of liver biopsy  2007  . History of Nephrectomy Right 1999  . KNEE ARTHROSCOPY Left 10/10/2012   Procedure: LEFT KNEE ARTHROSCOPY WITH MENSCIAL DEBRIDEMENT AND CHONDROPLASTY;  Surgeon: Gearlean Alf, MD;  [facility];  Service: Orthopedics;  Laterality: Left;  . left shoulder repair for bone spur  1990's  . TEE WITHOUT CARDIOVERSION N/A 10/11/2018   Procedure: TRANSESOPHAGEAL ECHOCARDIOGRAM (TEE);  Surgeon: Buford Dresser, MD;  Location: [facility] ENDOSCOPY;  Service: Cardiovascular;  Laterality: N/A;  ablation to follow at 1030  . TOTAL KNEE ARTHROPLASTY Right 08/15/2016   Procedure: RIGHT TOTAL KNEE ARTHROPLASTY, CORTISONE INJECTION OF LEFT KNEE;  Surgeon: Gaynelle Arabian, MD;  [facility];  Service: Orthopedics;  Laterality: Right;  . TOTAL KNEE ARTHROPLASTY Left 09/04/2017   Procedure: LEFT TOTAL KNEE ARTHROPLASTY;  Surgeon: Gaynelle Arabian, MD;  [facility];  Service: Orthopedics;  Laterality: Left;  . Transurethral needle ablation procedure    . TUNA procedure          Home Medications    Prior to Admission medications   Medication Sig Start Date End Date Taking? Authorizing Provider  acetaminophen (TYLENOL) 650 MG CR tablet Take 650 mg by mouth at bedtime.     [provider]  allopurinol (ZYLOPRIM) 100 MG tablet Take 100 mg by mouth every evening.  08/14/18   [provider]  Ascorbic Acid (VITAMIN C) 1000 MG tablet Take 1,000 mg by mouth daily.    [provider]  carvedilol (COREG) 12.5 MG tablet TAKE 1 TABLET BY MOUTH TWICE DAILY WITH A MEAL. 01/28/19   Dorothy Spark, MD  ELIQUIS 5 MG TABS tablet TAKE 1 TABLET BY MOUTH TWICE DAILY. 10/15/18   Allred, Jeneen Rinks, MD  Evolocumab (REPATHA SURECLICK) [id number] MG/ML SOAJ Inject 1 pen into the skin every 14 (fourteen) days. 04/03/19   Dorothy Spark,  MD  flecainide (TAMBOCOR) 50 MG tablet Take 1 tablet (50 mg total) by mouth 2 (two) times daily. 05/09/19   Allred, Jeneen Rinks, MD  gabapentin (NEURONTIN) 600 MG tablet Take 900 mg by mouth at bedtime.     [provider]  hydrochlorothiazide (MICROZIDE) 12.5 MG capsule Take 1 capsule (12.5 mg total) by mouth daily. 01/04/19 12/30/19  Dorothy Spark, MD  Multiple Vitamin (MULTIVITAMIN) capsule Take 1 capsule by mouth daily.    [provider]  omeprazole (PRILOSEC) 20 MG capsule Take 20 mg by mouth 3 (three) times a week.    [provider]  pravastatin (PRAVACHOL) 10 MG tablet Take 1 tablet (10 mg total) by mouth daily. 04/02/19   Dorothy Spark, MD  Probiotic Product (PROBIOTIC PO) Take 1 tablet by mouth daily.    [provider]    Family History Family History  Problem Relation Age of Onset  . Coronary artery disease Mother   . CVA Mother   . Prostate cancer Brother   . Alcohol abuse  Father        variceal hemorrhage  . Alcohol abuse Sister   . Alcohol abuse Other        Strong family Hx  . Diabetes Sister   . Breast cancer Sister   . Lung cancer Sister   . Esophageal cancer Sister   . Cancer Brother        head and neck    Social History Social History   Tobacco Use  . Smoking status: Former Smoker    Packs/day: 1.00    Years: 15.00    Pack years: 15.00    Types: Cigarettes    Quit date: 08/01/1977    Years since quitting: 41.8  . Smokeless tobacco: Never Used  Substance Use Topics  . Alcohol use: No    Comment: past history -none in 38 yrs  . Drug use: No    Comment: past hx none in 38 yrs     Allergies   Statins, Acetaminophen, Aspirin, Nsaids, and Pravastatin   Review of Systems Review of Systems  Constitutional: Negative for chills and fever.  HENT: Positive for nosebleeds. Negative for ear pain and sore throat.   Eyes: Negative for pain and visual disturbance.  Respiratory: Negative for cough and shortness of breath.    Cardiovascular: Negative for chest pain and palpitations.  Gastrointestinal: Negative for abdominal pain and vomiting.  Genitourinary: Negative for dysuria and hematuria.  Musculoskeletal: Negative for arthralgias and back pain.  Skin: Negative for color change and rash.  Neurological: Negative for seizures and syncope.  All other systems reviewed and are negative.    Physical Exam Updated Vital Signs BP (!) 137/101 (BP Location: Left Arm)   Pulse 63   Temp (!) 97.4 F (36.3 C) (Oral)   Resp 16   Ht 6' (1.829 m)   Wt 95.3 kg   SpO2 98%   BMI 28.48 kg/m   Physical Exam Vitals signs and nursing note reviewed.  Constitutional:      Appearance: He is well-developed.  HENT:     Head: Normocephalic and atraumatic.     Nose:     Comments: Obvious bleeding from left nare (initial assessment) Eyes:     Conjunctiva/sclera: Conjunctivae normal.  Neck:     Musculoskeletal: Neck supple.  Cardiovascular:     Rate and Rhythm: Normal rate and regular rhythm.     Heart sounds: No murmur.  Pulmonary:     Effort: Pulmonary effort is normal. No respiratory distress.     Breath sounds: Normal breath sounds.  Abdominal:     Palpations: Abdomen is soft.     Tenderness: There is no abdominal tenderness.  Skin:    General: Skin is warm and dry.  Neurological:     Mental Status: He is alert.      ED Treatments / Results  Labs (all labs ordered are listed, but only abnormal results are displayed) Labs Reviewed - No data to display  EKG None  Radiology No results found.  Procedures .Epistaxis Management  Date/Time: 05/27/2019 4:56 PM Performed by: Lucrezia Starch, MD Authorized by: Lucrezia Starch, MD   Consent:    Consent obtained:  Verbal   Consent given by:  Patient   Risks discussed:  Bleeding, infection and nasal injury   Alternatives discussed:  No treatment, delayed treatment, alternative treatment and observation Anesthesia (see MAR for exact dosages):     Anesthesia method:  None Procedure details:    Treatment site:  L anterior  Repair method: TXA.   Treatment complexity:  Limited   Treatment episode: recurring   Post-procedure details:    Assessment:  Bleeding stopped   Patient tolerance of procedure:  Tolerated well, no immediate complications Comments:     Soaked pledget in TXA diluted one-to-one, inserted in the left nare, allowed to sit for 20 minutes, then removed pledget and repeated this for second treatment, removed pledget, and performed close inspection, no active bleeding was identified in bilateral nares   (including critical care time)  Medications Ordered in ED Medications  oxymetazoline (AFRIN) 0.05 % nasal spray 2 spray (2 sprays Each Nare Given 05/27/19 1049)  tranexamic acid (CYKLOKAPRON) injection 500 mg (500 mg Topical Given by Other 05/27/19 1052)     Initial Impression / Assessment and Plan / ED Course  I have reviewed the triage vital signs and the nursing notes.  Pertinent labs & imaging results that were available during my care of the patient were reviewed by me and considered in my medical decision making (see chart for details).  Clinical Course as of May 27 1335  Mon May 27, 2019  1109 Complete initial assessment, placed TXA soaked pledget in left nare, will recheck patient 20 minutes or so   [RD]  1216 Rechecked, no active bleeding, will dc home   [RD]    Clinical Course User Index [RD] Lucrezia Starch, MD      73 year old male presents with nosebleed, on Eliquis for A. fib.  Nosebleed stopped successfully after using TXA soaked pledget.  Did not require cautery, was able to be discharged without leaving any packing.  Recommend using daily Afrin for the next couple of days and close recheck with his primary doctor.  Reviewed return precautions.  After the discussed management above, the patient was determined to be safe for discharge.  The patient was in agreement with this plan and all  questions regarding their care were answered.  ED return precautions were discussed and the patient will return to the ED with any significant worsening of condition.   Final Clinical Impressions(s) / ED Diagnoses   Final diagnoses:  Epistaxis    ED Discharge Orders    None       Lucrezia Starch, MD 05/27/19 1659

## 2019-05-28 ENCOUNTER — Telehealth: Payer: Self-pay

## 2019-05-28 ENCOUNTER — Other Ambulatory Visit: Payer: Medicare Other

## 2019-05-28 DIAGNOSIS — I712 Thoracic aortic aneurysm, without rupture, unspecified: Secondary | ICD-10-CM

## 2019-05-28 DIAGNOSIS — I2510 Atherosclerotic heart disease of native coronary artery without angina pectoris: Secondary | ICD-10-CM | POA: Diagnosis not present

## 2019-05-28 DIAGNOSIS — E1121 Type 2 diabetes mellitus with diabetic nephropathy: Secondary | ICD-10-CM

## 2019-05-28 DIAGNOSIS — I480 Paroxysmal atrial fibrillation: Secondary | ICD-10-CM

## 2019-05-28 DIAGNOSIS — E782 Mixed hyperlipidemia: Secondary | ICD-10-CM

## 2019-05-28 DIAGNOSIS — I2584 Coronary atherosclerosis due to calcified coronary lesion: Secondary | ICD-10-CM | POA: Diagnosis not present

## 2019-05-28 DIAGNOSIS — I7121 Aneurysm of the ascending aorta, without rupture: Secondary | ICD-10-CM

## 2019-05-28 NOTE — Telephone Encounter (Signed)
New message    When should pt start the Eliquis ? The Er had him stop it yesterday due to a artery in his nose rupturing?

## 2019-05-28 NOTE — Telephone Encounter (Signed)
Please have him restart on Thursday morning.

## 2019-05-28 NOTE — Telephone Encounter (Signed)
Spoke with the pt and endorsed to him that per Dr Meda Coffee, he can restart taking his Eliquis on Thursday morning.  Pt verbalized understanding and agrees with this plan. Pt was more than gracious for all the assistance provided.

## 2019-05-29 LAB — COMPREHENSIVE METABOLIC PANEL
ALT: 17 IU/L (ref 0–44)
AST: 26 IU/L (ref 0–40)
Albumin/Globulin Ratio: 2.2 (ref 1.2–2.2)
Albumin: 4.2 g/dL (ref 3.7–4.7)
Alkaline Phosphatase: 68 IU/L (ref 39–117)
BUN/Creatinine Ratio: 18 (ref 10–24)
BUN: 24 mg/dL (ref 8–27)
Bilirubin Total: <0.2 mg/dL (ref 0.0–1.2)
CO2: 24 mmol/L (ref 20–29)
Calcium: 9.5 mg/dL (ref 8.6–10.2)
Chloride: 98 mmol/L (ref 96–106)
Creatinine, Ser: 1.3 mg/dL — ABNORMAL HIGH (ref 0.76–1.27)
GFR calc Af Amer: 63 mL/min/{1.73_m2} (ref >59)
GFR calc non Af Amer: 54 mL/min/{1.73_m2} — ABNORMAL LOW (ref >59)
Globulin, Total: 1.9 g/dL (ref 1.5–4.5)
Glucose: 123 mg/dL — ABNORMAL HIGH (ref 65–99)
Potassium: 4.4 mmol/L (ref 3.5–5.2)
Sodium: 137 mmol/L (ref 134–144)
Total Protein: 6.1 g/dL (ref 6.0–8.5)

## 2019-05-29 LAB — LIPID PANEL
Chol/HDL Ratio: 1.7 ratio (ref 0.0–5.0)
Cholesterol, Total: 79 mg/dL — ABNORMAL LOW (ref 100–199)
HDL: 46 mg/dL (ref >39)
LDL Chol Calc (NIH): 18 mg/dL (ref 0–99)
Triglycerides: 63 mg/dL (ref 0–149)
VLDL Cholesterol Cal: 15 mg/dL (ref 5–40)

## 2019-05-29 LAB — HEMOGLOBIN A1C
Est. average glucose Bld gHb Est-mCnc: 137 mg/dL
Hgb A1c MFr Bld: 6.4 % — ABNORMAL HIGH (ref 4.8–5.6)

## 2019-06-10 DIAGNOSIS — K7460 Unspecified cirrhosis of liver: Secondary | ICD-10-CM | POA: Diagnosis not present

## 2019-06-12 DIAGNOSIS — M47816 Spondylosis without myelopathy or radiculopathy, lumbar region: Secondary | ICD-10-CM | POA: Diagnosis not present

## 2019-06-19 ENCOUNTER — Ambulatory Visit (HOSPITAL_COMMUNITY)
Admission: RE | Admit: 2019-06-19 | Discharge: 2019-06-19 | Disposition: A | Discharge status: Home / Self Care | Payer: Medicare Other | Source: Ambulatory Visit | Attending: Cardiology | Admitting: Cardiology

## 2019-06-19 ENCOUNTER — Other Ambulatory Visit: Payer: Self-pay

## 2019-06-19 DIAGNOSIS — I712 Thoracic aortic aneurysm, without rupture, unspecified: Secondary | ICD-10-CM

## 2019-06-19 MED ORDER — GADOBUTROL 1 MMOL/ML IV SOLN
10.0000 mL | Freq: Once | INTRAVENOUS | Status: AC | PRN
  Administered 2019-06-19: 10 mL via INTRAVENOUS

## 2019-06-24 DIAGNOSIS — H524 Presbyopia: Secondary | ICD-10-CM | POA: Diagnosis not present

## 2019-07-08 ENCOUNTER — Telehealth (INDEPENDENT_AMBULATORY_CARE_PROVIDER_SITE_OTHER): Payer: Medicare Other | Admitting: Internal Medicine

## 2019-07-08 VITALS — BP 137/77 | HR 60 | Ht 72.0 in | Wt 213.0 lb

## 2019-07-08 DIAGNOSIS — I480 Paroxysmal atrial fibrillation: Secondary | ICD-10-CM

## 2019-07-08 DIAGNOSIS — I10 Essential (primary) hypertension: Secondary | ICD-10-CM | POA: Diagnosis not present

## 2019-07-08 NOTE — Progress Notes (Signed)
Electrophysiology TeleHealth Note   Due to national recommendations of social distancing due to COVID 19, an audio/video telehealth visit is felt to be most appropriate for this patient at this time.  See MyChart message from today for the patient's consent to telehealth for [facility]    Date:  07/08/2019   ID:  Timothy Dickson, DOB 22-Dec-1945, MRN [id number]  Location: patient's home  Provider location:  [facility]  Evaluation Performed: Follow-up visit  PCP:  Timothy Contras, MD   Electrophysiologist:  Timothy Timothy Dickson  Chief Complaint:  AF follow up  History of Present Illness:    Timothy Dickson is a 73 y.o. male who presents via telehealth conferencing today.  Since last being seen in our clinic, the patient reports doing very well.  Today, he denies symptoms of palpitations, chest pain, shortness of breath,  lower extremity edema, dizziness, presyncope, or syncope.  The patient is otherwise without complaint today.  The patient denies symptoms of fevers, chills, cough, or new SOB worrisome for COVID 19.  Past Medical History:  Diagnosis Date  . Atrial tachycardia ([place])    documented by holter 2016. Followed by Timothy. Liane Dickson and Timothy. Rayann Dickson.  . 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 no further tx other than surgery [facility].  [name] Cirrhosis (Timothy Dickson)    [facility] dx, early stage- follow up visits at [facility].  [name] CRI (chronic renal insufficiency)    s/p R nephrectomy in 1999 for malignancy  . Diabetes mellitus without complication (HCC)    NIDDM- dx. 3-4 years ago diet control  . Diverticulosis of colon    mild  . GERD (gastroesophageal reflux disease)   . Headache(784.0)   . Hepatitis C    s/p therapy [facility] (curative)/with Timothy Dickson  . History of BPH    no recent issues  . History of substance abuse ([place])    past history -none in 38 yrs  . Hyperglycemia   . Hypertension   . Osteoarthritis, knee    knees bilaterally   . Pneumonia 1975   hx of 35 years ago    Past Surgical  History:  Procedure Laterality Date  . Arthroscopy right knee     may '11 (Timothy Dickson)  . Arthroscopy, left knee,    . ATRIAL FIBRILLATION ABLATION N/A 10/11/2018   Procedure: ATRIAL FIBRILLATION ABLATION;  Surgeon: Timothy Grayer, MD;  Location: [facility];  Service: Cardiovascular;  Laterality: N/A;  . history of liver biopsy  2007  . History of Nephrectomy Right 1999  . KNEE ARTHROSCOPY Left 10/10/2012   Procedure: LEFT KNEE ARTHROSCOPY WITH MENSCIAL DEBRIDEMENT AND CHONDROPLASTY;  Surgeon: Gearlean Alf, MD;  Location: [facility];  Service: Orthopedics;  Laterality: Left;  . left shoulder repair for bone spur  1990's  . TEE WITHOUT CARDIOVERSION N/A 10/11/2018   Procedure: TRANSESOPHAGEAL ECHOCARDIOGRAM (TEE);  Surgeon: Timothy Dresser, MD;  Location: [facility] ENDOSCOPY;  Service: Cardiovascular;  Laterality: N/A;  ablation to follow at 1030  . TOTAL KNEE ARTHROPLASTY Right 08/15/2016   Procedure: RIGHT TOTAL KNEE ARTHROPLASTY, CORTISONE INJECTION OF LEFT KNEE;  Surgeon: Timothy Arabian, MD;  Location: [facility];  Service: Orthopedics;  Laterality: Right;  . TOTAL KNEE ARTHROPLASTY Left 09/04/2017   Procedure: LEFT TOTAL KNEE ARTHROPLASTY;  Surgeon: Timothy Arabian, MD;  Location: [facility];  Service: Orthopedics;  Laterality: Left;  . Transurethral needle ablation procedure    . TUNA procedure      Current Outpatient Medications  Medication  Sig Dispense Refill  . acetaminophen (TYLENOL) 650 MG CR tablet Take 650 mg by mouth at bedtime.     [name] allopurinol (ZYLOPRIM) 100 MG tablet Take 100 mg by mouth every evening.     . Ascorbic Acid (VITAMIN C) 1000 MG tablet Take 1,000 mg by mouth daily.    . carvedilol (COREG) 12.5 MG tablet TAKE 1 TABLET BY MOUTH TWICE DAILY WITH A MEAL. 180 tablet 3  . ELIQUIS 5 MG TABS tablet TAKE 1 TABLET BY MOUTH TWICE DAILY. 60 tablet 11  . Evolocumab (REPATHA SURECLICK) [id number] MG/ML SOAJ Inject 1 pen into the skin every 14 (fourteen) days. 6 pen 3  . flecainide  (TAMBOCOR) 50 MG tablet Take 1 tablet (50 mg total) by mouth 2 (two) times daily. 180 tablet 3  . gabapentin (NEURONTIN) 600 MG tablet Take 900 mg by mouth at bedtime.     . hydrochlorothiazide (MICROZIDE) 12.5 MG capsule Take 1 capsule (12.5 mg total) by mouth daily. 90 capsule 3  . Multiple Vitamin (MULTIVITAMIN) capsule Take 1 capsule by mouth daily.    [name] omeprazole (PRILOSEC) 20 MG capsule Take 20 mg by mouth 3 (three) times a week.    . pravastatin (PRAVACHOL) 10 MG tablet Take 1 tablet (10 mg total) by mouth daily. 30 tablet 11  . Probiotic Product (PROBIOTIC PO) Take 1 tablet by mouth daily.     No current facility-administered medications for this visit.     Allergies:   Statins, Acetaminophen, Aspirin, Nsaids, and Pravastatin   Social History:  The patient  reports that he quit smoking about 41 years ago. His smoking use included cigarettes. He has a 15.00 pack-year smoking history. He has never used smokeless tobacco. He reports that he does not drink alcohol or use drugs.   Family History:  The patient's  family history includes Alcohol abuse in his father, sister, and another family member; Breast cancer in his sister; CVA in his mother; Cancer in his brother; Coronary artery disease in his mother; Diabetes in his sister; Esophageal cancer in his sister; Lung cancer in his sister; Prostate cancer in his brother.   ROS:  Please see the history of present illness.   All other systems are personally reviewed and negative.    Exam:    Vital Signs:  BP 137/77   Pulse 60   Ht 6' (1.829 m)   Wt 213 lb (96.6 kg)   BMI 28.89 kg/m   Well sounding and appearing, alert and conversant, regular work of breathing,  good skin color Eyes- anicteric, neuro- grossly intact, skin- no apparent rash or lesions or cyanosis, mouth- oral mucosa is pink  Labs/Other Tests and Data Reviewed:    Recent Labs: 10/11/2018: Hemoglobin 15.4; Platelets 218 05/28/2019: ALT 17; BUN 24; Creatinine, Ser  1.30; Potassium 4.4; Sodium 137   Wt Readings from Last 3 Encounters:  07/08/19 213 lb (96.6 kg)  05/27/19 210 lb (95.3 kg)  04/10/19 210 lb (95.3 kg)       ASSESSMENT & PLAN:    1.  Paroxysmal atrial fibrillation Doing well without significant AF recurrence He is currently taking Flecainide 100mg  daily with no recurrent AF Continue Eliquis for CHADS2VASC of 4 Stop Flecainide today  2  HTN Stable No change required today   Follow-up:  With me in 4 months    Patient Risk:  after full review of this patients clinical status, I feel that they are at moderate risk at this time.  Today, I have  spent 15 minutes with the patient with telehealth technology discussing arrhythmia management .    Army Fossa, MD  07/08/2019 9:48 AM     [facility] [address] [place] [address] [phone] (office) [phone] (fax)

## 2019-07-11 DIAGNOSIS — E1142 Type 2 diabetes mellitus with diabetic polyneuropathy: Secondary | ICD-10-CM | POA: Diagnosis not present

## 2019-07-11 DIAGNOSIS — I10 Essential (primary) hypertension: Secondary | ICD-10-CM | POA: Diagnosis not present

## 2019-07-11 DIAGNOSIS — E1140 Type 2 diabetes mellitus with diabetic neuropathy, unspecified: Secondary | ICD-10-CM | POA: Diagnosis not present

## 2019-07-11 DIAGNOSIS — I480 Paroxysmal atrial fibrillation: Secondary | ICD-10-CM | POA: Diagnosis not present

## 2019-07-19 ENCOUNTER — Telehealth (HOSPITAL_COMMUNITY): Payer: Self-pay | Admitting: *Deleted

## 2019-07-19 NOTE — Telephone Encounter (Signed)
Patient called in stating he went back into AF yesterday HR around 95. Stopped flecainide per Dr. Rayann Heman recommendations on 12/7. Discussed with Adline Peals PA will restart flecainide 50mg  twice a day and follow up call on Monday with update. If still out of rhythm will bring in for assessment. Pt in agreement.

## 2019-07-22 NOTE — Telephone Encounter (Signed)
Kardia strips reviewed from patient. Appears to be SR with PVCs. F/u with Dr Rayann Heman per recall. Follow up in AF clinic if he should have issues in the meantime.

## 2019-07-22 NOTE — Telephone Encounter (Signed)
Patient called in stating he feels back in NSR. Kardia strips received. Adline Peals PA to review.

## 2019-08-20 ENCOUNTER — Other Ambulatory Visit (HOSPITAL_COMMUNITY): Payer: Self-pay | Admitting: *Deleted

## 2019-08-20 MED ORDER — FLECAINIDE ACETATE 50 MG PO TABS: ORAL_TABLET | ORAL | 3 refills | Status: DC

## 2019-09-01 ENCOUNTER — Ambulatory Visit: Payer: Medicare Other

## 2019-09-04 ENCOUNTER — Other Ambulatory Visit: Payer: Self-pay | Admitting: Internal Medicine

## 2019-09-05 MED ORDER — HYDROCHLOROTHIAZIDE 25 MG PO TABS: 25.0000 mg | ORAL_TABLET | Freq: Every day | ORAL | 2 refills | Status: DC

## 2019-09-05 NOTE — Telephone Encounter (Signed)
New dose increase for HCTZ 25 mg po daily sent into the pts pharmacy of choice. He was made aware of this as well as made aware that Dr. Meda Coffee would like for him to record his pressures for a week or so and send [place] the numbers via mychart to further evaluate.  He was made aware via mychart message.

## 2019-09-06 ENCOUNTER — Other Ambulatory Visit: Payer: Self-pay

## 2019-09-06 ENCOUNTER — Ambulatory Visit: Payer: Medicare Other

## 2019-09-06 ENCOUNTER — Other Ambulatory Visit: Payer: Self-pay | Admitting: Internal Medicine

## 2019-09-06 ENCOUNTER — Ambulatory Visit (HOSPITAL_COMMUNITY)
Admission: RE | Admit: 2019-09-06 | Discharge: 2019-09-06 | Disposition: A | Discharge status: Home / Self Care | Payer: Medicare Other | Source: Ambulatory Visit | Attending: Physician Assistant | Admitting: Physician Assistant

## 2019-09-06 VITALS — BP 120/78 | HR 62 | Ht 72.0 in | Wt 218.6 lb

## 2019-09-06 DIAGNOSIS — K7460 Unspecified cirrhosis of liver: Secondary | ICD-10-CM | POA: Diagnosis not present

## 2019-09-06 DIAGNOSIS — Z85528 Personal history of other malignant neoplasm of kidney: Secondary | ICD-10-CM | POA: Diagnosis not present

## 2019-09-06 DIAGNOSIS — Z8249 Family history of ischemic heart disease and other diseases of the circulatory system: Secondary | ICD-10-CM | POA: Diagnosis not present

## 2019-09-06 DIAGNOSIS — Z7901 Long term (current) use of anticoagulants: Secondary | ICD-10-CM | POA: Diagnosis not present

## 2019-09-06 DIAGNOSIS — N400 Enlarged prostate without lower urinary tract symptoms: Secondary | ICD-10-CM | POA: Insufficient documentation

## 2019-09-06 DIAGNOSIS — N189 Chronic kidney disease, unspecified: Secondary | ICD-10-CM | POA: Insufficient documentation

## 2019-09-06 DIAGNOSIS — D6869 Other thrombophilia: Secondary | ICD-10-CM | POA: Diagnosis not present

## 2019-09-06 DIAGNOSIS — M170 Bilateral primary osteoarthritis of knee: Secondary | ICD-10-CM | POA: Diagnosis not present

## 2019-09-06 DIAGNOSIS — I130 Hypertensive heart and chronic kidney disease with heart failure and stage 1 through stage 4 chronic kidney disease, or unspecified chronic kidney disease: Secondary | ICD-10-CM | POA: Diagnosis not present

## 2019-09-06 DIAGNOSIS — Z905 Acquired absence of kidney: Secondary | ICD-10-CM | POA: Diagnosis not present

## 2019-09-06 DIAGNOSIS — K219 Gastro-esophageal reflux disease without esophagitis: Secondary | ICD-10-CM | POA: Insufficient documentation

## 2019-09-06 DIAGNOSIS — Z87891 Personal history of nicotine dependence: Secondary | ICD-10-CM | POA: Insufficient documentation

## 2019-09-06 DIAGNOSIS — I4819 Other persistent atrial fibrillation: Secondary | ICD-10-CM | POA: Insufficient documentation

## 2019-09-06 DIAGNOSIS — Z79899 Other long term (current) drug therapy: Secondary | ICD-10-CM | POA: Diagnosis not present

## 2019-09-06 DIAGNOSIS — E1122 Type 2 diabetes mellitus with diabetic chronic kidney disease: Secondary | ICD-10-CM | POA: Insufficient documentation

## 2019-09-06 DIAGNOSIS — I4510 Unspecified right bundle-branch block: Secondary | ICD-10-CM | POA: Insufficient documentation

## 2019-09-06 MED ORDER — FLECAINIDE ACETATE 50 MG PO TABS: 50.0000 mg | ORAL_TABLET | Freq: Two times a day (BID) | ORAL | 3 refills | Status: DC

## 2019-09-06 NOTE — Progress Notes (Signed)
Primary Care Physician: Antony Contras, MD Primary Cardiologist: Dr Meda Coffee Primary Electrophysiologist: Dr Thompson Grayer Timothy Dickson is a 74 y.o. male with a history of persistent atrial fibrillation, rt nephrectomy 2/2 renal CA, and cirrhosis who presents for follow up in the [facility]. Patient is s/p afib ablation with Dr Rayann Heman on 10/11/18. Patient reports that he has done reasonably well since his last visit. He does have infrequent palpitations associated with dizziness. He reports that he has noted pain and tingling in his feet at night since starting the flecainide. These symptoms resolve at lower doses.   Today, he denies symptoms of chest pain, orthopnea, PND, lower extremity edema, dizziness, presyncope, syncope, snoring, daytime somnolence, bleeding, or neurologic sequela. The patient is tolerating medications without difficulties and is otherwise without complaint today.   Atrial Fibrillation Risk Factors:  he does not have symptoms or diagnosis of sleep apnea. he does not have a history of rheumatic fever. he does not have a history of alcohol use.  he has a BMI of Body mass index is 29.65 kg/m.[name] Filed Weights   09/06/19 1103  Weight: 99.2 kg     Atrial Fibrillation Management history:  Previous antiarrhythmic drugs: flecainide Previous cardioversions: 03/18/18 Previous ablations: 10/11/18 CHADS2VASC score: 4 Anticoagulation history: Eliquis   Past Medical History:  Diagnosis Date  . Atrial tachycardia ([place])    documented by holter 2016. Followed by Dr. Liane Comber and Dr. Rayann Heman.  . Cancer of kidney ([place])    right kidney-removed. no further tx other than surgery [facility].  [name] Cirrhosis ([place])    [facility] dx, early stage- follow up visits at [facility].  [name] CRI (chronic renal insufficiency)    s/p R nephrectomy in 1999 for malignancy  . Diabetes mellitus without complication (HCC)    NIDDM- dx. 3-4 years ago diet control  . Diverticulosis of colon     mild  . GERD (gastroesophageal reflux disease)   . Headache(784.0)   . Hepatitis C    s/p therapy [facility] (curative)/with Harvoni  . History of BPH    no recent issues  . History of substance abuse ([place])    past history -none in 38 yrs  . Hyperglycemia   . Hypertension   . Osteoarthritis, knee    knees bilaterally   . Pneumonia 1975   hx of 35 years ago   Past Surgical History:  Procedure Laterality Date  . Arthroscopy right knee     may '11 (Dr Alvan Dame)  . Arthroscopy, left knee,    . ATRIAL FIBRILLATION ABLATION N/A 10/11/2018   Procedure: ATRIAL FIBRILLATION ABLATION;  Surgeon: Thompson Grayer, MD;  Location: [facility];  Service: Cardiovascular;  Laterality: N/A;  . history of liver biopsy  2007  . History of Nephrectomy Right 1999  . KNEE ARTHROSCOPY Left 10/10/2012   Procedure: LEFT KNEE ARTHROSCOPY WITH MENSCIAL DEBRIDEMENT AND CHONDROPLASTY;  Surgeon: Gearlean Alf, MD;  [facility];  Service: Orthopedics;  Laterality: Left;  . left shoulder repair for bone spur  1990's  . TEE WITHOUT CARDIOVERSION N/A 10/11/2018   Procedure: TRANSESOPHAGEAL ECHOCARDIOGRAM (TEE);  Surgeon: Buford Dresser, MD;  Location: [facility] ENDOSCOPY;  Service: Cardiovascular;  Laterality: N/A;  ablation to follow at 1030  . TOTAL KNEE ARTHROPLASTY Right 08/15/2016   Procedure: RIGHT TOTAL KNEE ARTHROPLASTY, CORTISONE INJECTION OF LEFT KNEE;  Surgeon: Gaynelle Arabian, MD;  [facility]: [facility];  Service: Orthopedics;  Laterality: Right;  . TOTAL KNEE ARTHROPLASTY Left 09/04/2017  Procedure: LEFT TOTAL KNEE ARTHROPLASTY;  Surgeon: Gaynelle Arabian, MD;  [facility];  Service: Orthopedics;  Laterality: Left;  . Transurethral needle ablation procedure    . TUNA procedure      Current Outpatient Medications  Medication Sig Dispense Refill  . acetaminophen (TYLENOL) 650 MG CR tablet Take 650 mg by mouth at bedtime.     [name] allopurinol (ZYLOPRIM) 100 MG tablet Take 100 mg by mouth every evening.     .  Ascorbic Acid (VITAMIN C) 1000 MG tablet Take 1,000 mg by mouth daily.    . carbamide peroxide (DEBROX) 6.5 % OTIC solution Ear Wax Removal Kit 6.5 % drops  ADMINISTER 5 DROPS INTO BOTH EARS 2 (TWO) TIMES A DAY FOR 4 DAYS.    [name] carvedilol (COREG) 12.5 MG tablet TAKE 1 TABLET BY MOUTH TWICE DAILY WITH A MEAL. 180 tablet 3  . ELIQUIS 5 MG TABS tablet TAKE 1 TABLET BY MOUTH TWICE DAILY. 60 tablet 11  . Evolocumab (REPATHA SURECLICK) 793 MG/ML SOAJ Inject 1 pen into the skin every 14 (fourteen) days. 6 pen 3  . famotidine (PEPCID) 20 MG tablet Take 20 mg by mouth. Taking one tablet once a week    . flecainide (TAMBOCOR) 50 MG tablet Take 1 tablet (50 mg total) by mouth 2 (two) times daily. 180 tablet 3  . gabapentin (NEURONTIN) 600 MG tablet Take 900 mg by mouth at bedtime.     . hydrochlorothiazide (HYDRODIURIL) 25 MG tablet Take 1 tablet (25 mg total) by mouth daily. 90 tablet 2  . Multiple Vitamin (MULTIVITAMIN) capsule Take 1 capsule by mouth daily.    . Multiple Vitamins-Minerals (IMMUNE SUPPORT PO) Take by mouth daily. Taking two tablets daily- Vitamin D, echinacea, zinc and elderberry    . omeprazole (PRILOSEC) 20 MG capsule Take 20 mg by mouth 3 (three) times a week.    . pravastatin (PRAVACHOL) 10 MG tablet Take 1 tablet (10 mg total) by mouth daily. 30 tablet 11   No current facility-administered medications for this encounter.    Allergies  Allergen Reactions  . Statins     Due to damaged liver  . Acetaminophen Other (See Comments)    Pt states he was told by [facility] MD's to keep tylenol dosage to 2000 mg per 24 hours due to cirrhosis of liver.  . Aspirin Other (See Comments)    Pt has only 1 kidney - been told to stay away from Aspirin   . Nsaids     Avoid due to having 1 kidney   . Pravastatin Other (See Comments)    Pt reports this med makes him generally feel unwell.    Social History   Socioeconomic History  . Marital status: Married    Spouse name: Not on file  . Number  of children: Not on file  . Years of education: Not on file  . Highest education level: Not on file  Occupational History  . Not on file  Tobacco Use  . Smoking status: Former Smoker    Packs/day: 1.00    Years: 15.00    Pack years: 15.00    Types: Cigarettes    Quit date: 08/01/1977    Years since quitting: 42.1  . Smokeless tobacco: Never Used  Substance and Sexual Activity  . Alcohol use: No    Comment: past history -none in 38 yrs  . Drug use: No    Comment: past hx none in 38 yrs  . Sexual activity: Yes  Partners: Female  Other Topics Concern  . Not on file  Social History Narrative   [place], [place] psych. Married- '84. 2 step-daughters, 4 grandchildren. Work  Retired Radio producer  Marriage in good health.   Social Determinants of Health   Financial Resource Strain:   . Difficulty of Paying Living Expenses: Not on file  Food Insecurity:   . Worried About Charity fundraiser in the Last Year: Not on file  . Ran Out of Food in the Last Year: Not on file  Transportation Needs:   . Lack of Transportation (Medical): Not on file  . Lack of Transportation (Non-Medical): Not on file  Physical Activity:   . Days of Exercise per Week: Not on file  . Minutes of Exercise per Session: Not on file  Stress:   . Feeling of Stress : Not on file  Social Connections:   . Frequency of Communication with Friends and Family: Not on file  . Frequency of Social Gatherings with Friends and Family: Not on file  . Attends Religious Services: Not on file  . Active Member of Clubs or Organizations: Not on file  . Attends Archivist Meetings: Not on file  . Marital Status: Not on file  Intimate Partner Violence:   . Fear of Current or Ex-Partner: Not on file  . Emotionally Abused: Not on file  . Physically Abused: Not on file  . Sexually Abused: Not on file    Family History  Problem Relation Age of Onset  . Coronary artery disease Mother    . CVA Mother   . Prostate cancer Brother   . Alcohol abuse Father        variceal hemorrhage  . Alcohol abuse Sister   . Alcohol abuse Other        Strong family Hx  . Diabetes Sister   . Breast cancer Sister   . Lung cancer Sister   . Esophageal cancer Sister   . Cancer Brother        head and neck    ROS- All systems are reviewed and negative except as per the HPI above.  Physical Exam: Vitals:   09/06/19 1103  BP: 120/78  Pulse: 62  Weight: 99.2 kg  Height: 6' (1.829 m)    GEN- The patient is well appearing elderly male, alert and oriented x 3 today.   HEENT-head normocephalic, atraumatic, sclera clear, conjunctiva pink, hearing intact, trachea midline. Lungs- Clear to ausculation bilaterally, normal work of breathing Heart- Regular rate and rhythm, no murmurs, rubs or gallops  GI- soft, NT, ND, + BS Extremities- no clubbing, cyanosis, or edema MS- no significant deformity or atrophy Skin- no rash or lesion Psych- euthymic mood, full affect Neuro- strength and sensation are intact   Wt Readings from Last 3 Encounters:  09/06/19 99.2 kg  07/08/19 96.6 kg  05/27/19 95.3 kg    EKG today demonstrates SR HR 62, inc RBBB, LAD, PR 184, QRS 102, QTc 432  Echo 03/23/18 demonstrated: - Left ventricle: The cavity size was normal. Systolic function was   normal. The estimated ejection fraction was in the range of 60%   to 65%. Wall motion was normal; there were no regional wall   motion abnormalities. Doppler parameters are consistent with   abnormal left ventricular relaxation (grade 1 diastolic   dysfunction). There was no evidence of elevated ventricular   filling pressure by Doppler parameters. - Aortic valve: There was no  regurgitation. - Aortic root: The aortic root was normal in size. - Left atrium: The atrium was mildly dilated. - Right ventricle: The cavity size was normal. Wall thickness was   normal. Systolic function was normal. - Right atrium: The  atrium was normal in size. - Tricuspid valve: There was mild regurgitation. - Pulmonary arteries: Systolic pressure was at the upper limits of   normal. - Inferior vena cava: The vessel was normal in size. - Pericardium, extracardiac: There was no pericardial effusion.  ETT 04/17/18 ETT with good exercise tolerance (9:09); no chest pain; normal blood pressure response; no ST changes; negative adequate exercise tolerance test; [facility] treadmill score 9.  Epic records are reviewed at length today  Assessment and Plan:  1. Persistent atrial fibrillation S/p afib ablation 10/11/18 with Dr Rayann Heman. Patient having symptoms of intermittent palpitations. Possible side effect of paresthesias on higher dose of flecainide.  We discussed therapeutic options today including changing AAD vs repeat ablation. For now, patient prefers to decrease flecainide back to 50 mg BID. If he has more breakthrough episodes, he would like to be considered for repeat ablation. Continue Coreg 12.5 mg BID.  Continue Eliquis 5 mg BID  This patients CHA2DS2-VASc Score and unadjusted Ischemic Stroke Rate (% per year) is equal to 4.8 % stroke rate/year from a score of 4  Above score calculated as 1 point each if present [CHF, HTN, DM, Vascular=MI/PAD/Aortic Plaque, Age if 65-74, or Male] Above score calculated as 2 points each if present [Age > 75, or Stroke/TIA/TE]   2. HTN HCTZ recently increased. Stable, no changes today.   Follow up with Dr Rayann Heman per recall.    [facility] [address] [place], [place] [address] [phone]

## 2019-09-07 ENCOUNTER — Ambulatory Visit: Payer: Medicare Other

## 2019-09-10 DIAGNOSIS — M19042 Primary osteoarthritis, left hand: Secondary | ICD-10-CM | POA: Diagnosis not present

## 2019-09-10 DIAGNOSIS — I480 Paroxysmal atrial fibrillation: Secondary | ICD-10-CM | POA: Diagnosis not present

## 2019-09-10 DIAGNOSIS — I10 Essential (primary) hypertension: Secondary | ICD-10-CM | POA: Diagnosis not present

## 2019-09-10 DIAGNOSIS — E1142 Type 2 diabetes mellitus with diabetic polyneuropathy: Secondary | ICD-10-CM | POA: Diagnosis not present

## 2019-09-18 ENCOUNTER — Telehealth (HOSPITAL_COMMUNITY): Payer: Self-pay | Admitting: *Deleted

## 2019-09-18 NOTE — Telephone Encounter (Signed)
Patient called in stating he had 8 hour episode of afib he took extra flecainide and it finally subsided. HR was in the 130s.

## 2019-09-20 DIAGNOSIS — M79631 Pain in right forearm: Secondary | ICD-10-CM | POA: Diagnosis not present

## 2019-10-08 ENCOUNTER — Other Ambulatory Visit: Payer: Self-pay | Admitting: Internal Medicine

## 2019-10-08 NOTE — Telephone Encounter (Signed)
Prescription refill request for Eliquis received.  Last office visit: [place], 09/06/2019 Scr: 1.21, 12/25/2018 Age:  74 y.o. Weight: 99.2 kg   Prescription refill sent.

## 2019-10-21 DIAGNOSIS — G5701 Lesion of sciatic nerve, right lower limb: Secondary | ICD-10-CM | POA: Insufficient documentation

## 2019-10-21 DIAGNOSIS — M5431 Sciatica, right side: Secondary | ICD-10-CM | POA: Diagnosis not present

## 2019-11-19 DIAGNOSIS — M5431 Sciatica, right side: Secondary | ICD-10-CM | POA: Diagnosis not present

## 2019-12-09 DIAGNOSIS — M7070 Other bursitis of hip, unspecified hip: Secondary | ICD-10-CM | POA: Diagnosis not present

## 2019-12-20 DIAGNOSIS — K7460 Unspecified cirrhosis of liver: Secondary | ICD-10-CM | POA: Diagnosis not present

## 2019-12-20 DIAGNOSIS — Z79899 Other long term (current) drug therapy: Secondary | ICD-10-CM | POA: Diagnosis not present

## 2019-12-20 DIAGNOSIS — Z8619 Personal history of other infectious and parasitic diseases: Secondary | ICD-10-CM | POA: Diagnosis not present

## 2019-12-20 DIAGNOSIS — Z87891 Personal history of nicotine dependence: Secondary | ICD-10-CM | POA: Diagnosis not present

## 2019-12-20 DIAGNOSIS — Z1389 Encounter for screening for other disorder: Secondary | ICD-10-CM | POA: Diagnosis not present

## 2020-01-09 ENCOUNTER — Encounter: Payer: Self-pay | Admitting: Neurology

## 2020-01-09 DIAGNOSIS — Z0000 Encounter for general adult medical examination without abnormal findings: Secondary | ICD-10-CM | POA: Diagnosis not present

## 2020-01-09 DIAGNOSIS — E1142 Type 2 diabetes mellitus with diabetic polyneuropathy: Secondary | ICD-10-CM | POA: Diagnosis not present

## 2020-01-09 DIAGNOSIS — E1140 Type 2 diabetes mellitus with diabetic neuropathy, unspecified: Secondary | ICD-10-CM | POA: Diagnosis not present

## 2020-01-09 DIAGNOSIS — M109 Gout, unspecified: Secondary | ICD-10-CM | POA: Diagnosis not present

## 2020-01-10 ENCOUNTER — Telehealth: Payer: Self-pay | Admitting: Cardiology

## 2020-01-10 NOTE — Telephone Encounter (Addendum)
   [facility] Pre-operative Risk Assessment      Request for surgical clearance:  1. What type of surgery is being performed?  L3-4 Translaminar Lumbar Epidural Steroid Injection   2. When is this surgery scheduled? TBD   3. What type of clearance is required (medical clearance vs. Pharmacy clearance to hold med vs. Both)?  Both  4. Are there any medications that need to be held prior to surgery and how long? Eliquis   5. Practice name and name of physician performing surgery? [facility]- Dr. Ellene Route  6. What is the office phone number?  [phone]   7.   What is the office fax number? [place]   Anesthesia type (None, local, MAC, general) ? None mentioned   _________________________________________________________________   (provider comments below)

## 2020-01-10 NOTE — Telephone Encounter (Signed)
Patient with diagnosis of afib on Eliquis for anticoagulation.    Procedure: L3-4 Translaminar Lumbar Epidural Steroid Injection  Date of procedure: TBD  CHADS2-VASc score of 4 (age, HTN, DM, CAD based on calcium score), s/p ablation 10/11/18.  CrCl 98mL/min Platelet count 218K  Per office protocol, patient can hold Eliquis for 3 days prior to procedure.

## 2020-01-10 NOTE — Telephone Encounter (Signed)
   Primary Cardiologist: Ena Dawley, MD  Chart reviewed as part of pre-operative protocol coverage. Given past medical history and time since last visit, based on ACC/AHA guidelines, Timothy Dickson would be at acceptable risk for the planned procedure without further cardiovascular testing.   Patient with diagnosis of afib on Eliquis for anticoagulation.    Procedure: L3-4 Translaminar Lumbar Epidural Steroid Injection Date of procedure: TBD  CHADS2-VASc score of 4 (age, HTN, DM, CAD based on calcium score), s/p ablation 10/11/18.  CrCl 36mL/min Platelet count 218K  Per office protocol, patient can hold Eliquis for 3 days prior to procedure.  I will route this recommendation to the requesting party via Epic fax function and remove from pre-op pool.  Please call with questions.  Jossie Ng. Deshayla Empson NP-C    01/10/2020, 2:10 PM [place] [address] Office [phone] Fax [phone]

## 2020-01-11 ENCOUNTER — Other Ambulatory Visit: Payer: Self-pay | Admitting: Cardiology

## 2020-01-28 DIAGNOSIS — L814 Other melanin hyperpigmentation: Secondary | ICD-10-CM | POA: Diagnosis not present

## 2020-01-28 DIAGNOSIS — L820 Inflamed seborrheic keratosis: Secondary | ICD-10-CM | POA: Diagnosis not present

## 2020-01-28 DIAGNOSIS — Z85828 Personal history of other malignant neoplasm of skin: Secondary | ICD-10-CM | POA: Diagnosis not present

## 2020-01-28 DIAGNOSIS — L570 Actinic keratosis: Secondary | ICD-10-CM | POA: Diagnosis not present

## 2020-01-29 ENCOUNTER — Other Ambulatory Visit: Payer: Self-pay

## 2020-01-29 ENCOUNTER — Encounter: Payer: Self-pay | Admitting: Internal Medicine

## 2020-01-29 ENCOUNTER — Ambulatory Visit: Payer: Medicare Other | Admitting: Internal Medicine

## 2020-01-29 VITALS — BP 116/68 | HR 61 | Ht 72.0 in | Wt 213.0 lb

## 2020-01-29 DIAGNOSIS — I480 Paroxysmal atrial fibrillation: Secondary | ICD-10-CM | POA: Diagnosis not present

## 2020-01-29 DIAGNOSIS — I10 Essential (primary) hypertension: Secondary | ICD-10-CM

## 2020-01-29 NOTE — Progress Notes (Signed)
PCP: Antony Contras, MD Primary Cardiologist: Dr Meda Coffee Primary EP: Dr Thompson Grayer RYNE MCTIGUE is a 74 y.o. male who presents today for routine electrophysiology followup.  Since last being seen in our clinic, the patient reports doing very well.  He did have some afib with stopping flecainide and therefore has restarted this medicine.  He is pleased with current state.  Today, he denies symptoms of palpitations, chest pain, shortness of breath,  lower extremity edema, dizziness, presyncope, or syncope.  The patient is otherwise without complaint today.   Past Medical History:  Diagnosis Date  . Atrial tachycardia ([place])    documented by holter 2016. Followed by Dr. Liane Comber and Dr. Rayann Heman.  . 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 no further tx other than surgery [facility].  [name] Cirrhosis ([place])    [facility] dx, early stage- follow up visits at [facility].  [name] CRI (chronic renal insufficiency)    s/p R nephrectomy in 1999 for malignancy  . Diabetes mellitus without complication (HCC)    NIDDM- dx. 3-4 years ago diet control  . Diverticulosis of colon    mild  . GERD (gastroesophageal reflux disease)   . Headache(784.0)   . Hepatitis C    s/p therapy [facility] (curative)/with Harvoni  . History of BPH    no recent issues  . History of substance abuse ([place])    past history -none in 38 yrs  . Hyperglycemia   . Hypertension   . Osteoarthritis, knee    knees bilaterally   . Pneumonia 1975   hx of 35 years ago   Past Surgical History:  Procedure Laterality Date  . Arthroscopy right knee     may '11 (Dr Alvan Dame)  . Arthroscopy, left knee,    . ATRIAL FIBRILLATION ABLATION N/A 10/11/2018   Procedure: ATRIAL FIBRILLATION ABLATION;  Surgeon: Thompson Grayer, MD;  Location: [place] CV [facility];  Service: Cardiovascular;  Laterality: N/A;  . history of liver biopsy  2007  . History of Nephrectomy Right 1999  . KNEE ARTHROSCOPY Left 10/10/2012   Procedure: LEFT KNEE ARTHROSCOPY WITH MENSCIAL DEBRIDEMENT  AND CHONDROPLASTY;  Surgeon: Gearlean Alf, MD;  Location: [facility];  Service: Orthopedics;  Laterality: Left;  . left shoulder repair for bone spur  1990's  . TEE WITHOUT CARDIOVERSION N/A 10/11/2018   Procedure: TRANSESOPHAGEAL ECHOCARDIOGRAM (TEE);  Surgeon: Buford Dresser, MD;  Location: [facility] ENDOSCOPY;  Service: Cardiovascular;  Laterality: N/A;  ablation to follow at 1030  . TOTAL KNEE ARTHROPLASTY Right 08/15/2016   Procedure: RIGHT TOTAL KNEE ARTHROPLASTY, CORTISONE INJECTION OF LEFT KNEE;  Surgeon: Gaynelle Arabian, MD;  [facility];  Service: Orthopedics;  Laterality: Right;  . TOTAL KNEE ARTHROPLASTY Left 09/04/2017   Procedure: LEFT TOTAL KNEE ARTHROPLASTY;  Surgeon: Gaynelle Arabian, MD;  [facility];  Service: Orthopedics;  Laterality: Left;  . Transurethral needle ablation procedure    . TUNA procedure      ROS- all systems are reviewed and negatives except as per HPI above  Current Outpatient Medications  Medication Sig Dispense Refill  . acetaminophen (TYLENOL) 650 MG CR tablet Take 650 mg by mouth at bedtime.     [name] allopurinol (ZYLOPRIM) 100 MG tablet Take 100 mg by mouth every evening.     . Ascorbic Acid (VITAMIN C) 1000 MG tablet Take 1,000 mg by mouth daily.    . carvedilol (COREG) 12.5 MG tablet TAKE 1 TABLET BY MOUTH TWICE DAILY WITH A MEAL. 180 tablet 0  .  ELIQUIS 5 MG TABS tablet TAKE 1 TABLET BY MOUTH TWICE DAILY. 60 tablet 5  . Evolocumab (REPATHA SURECLICK) 378 MG/ML SOAJ Inject 1 pen into the skin every 14 (fourteen) days. 6 pen 3  . famotidine (PEPCID) 20 MG tablet Take 20 mg by mouth. Taking one tablet once a week    . flecainide (TAMBOCOR) 50 MG tablet Take 1 tablet (50 mg total) by mouth 2 (two) times daily. (Patient taking differently: Take 50 mg by mouth 3 (three) times daily. ) 180 tablet 3  . gabapentin (NEURONTIN) 600 MG tablet Take 900 mg by mouth at bedtime.     . hydrochlorothiazide (HYDRODIURIL) 25 MG tablet Take 1 tablet (25 mg total) by  mouth daily. 90 tablet 2  . Multiple Vitamin (MULTIVITAMIN) capsule Take 1 capsule by mouth daily.    . Multiple Vitamins-Minerals (IMMUNE SUPPORT PO) Take by mouth daily. Taking two tablets daily- Vitamin D, echinacea, zinc and elderberry    . omeprazole (PRILOSEC) 20 MG capsule Take 20 mg by mouth 3 (three) times a week.     No current facility-administered medications for this visit.    Physical Exam: Vitals:   01/29/20 1149  BP: 116/68  Pulse: 61  SpO2: 97%  Weight: 213 lb (96.6 kg)  Height: 6' (1.829 m)    GEN- The patient is well appearing, alert and oriented x 3 today.   Head- normocephalic, atraumatic Eyes-  Sclera clear, conjunctiva pink Ears- hearing intact Oropharynx- clear Lungs-  normal work of breathing Heart- Regular rate and rhythm  GI- soft  Extremities- no clubbing, cyanosis, or edema  Wt Readings from Last 3 Encounters:  01/29/20 213 lb (96.6 kg)  09/06/19 218 lb 9.6 oz (99.2 kg)  07/08/19 213 lb (96.6 kg)    EKG tracing ordered today is personally reviewed and shows sinus with PVCs  Assessment and Plan:  1. Paroxysmal atrial fibrillation Well controlled post ablation with flecainide 50mg  TID chads2vasc score is 4 Continue eliquis We could consider repeat ablation if his AF burden increases further Continue close follow-up to avoid toxicity with flecainide  2. HTN Stable No change required today   Risks, benefits and potential toxicities for medications prescribed and/or refilled reviewed with patient today.   Return in 4 months  Thompson Grayer MD, [facility] 01/29/2020 12:22 PM

## 2020-01-29 NOTE — Patient Instructions (Addendum)
Medication Instructions:  Your physician recommends that you continue on your current medications as directed. Please refer to the Current Medication list given to you today.  *If you need a refill on your cardiac medications before your next appointment, please call your pharmacy*  Lab Work: None ordered.  If you have labs (blood work) drawn today and your tests are completely normal, you will receive your results only by: [name] MyChart Message (if you have MyChart) OR . A paper copy in the mail If you have any lab test that is abnormal or we need to change your treatment, we will call you to review the results.  Testing/Procedures: None ordered.  Follow-Up: At [facility], you and your health needs are our priority.  As part of our continuing mission to provide you with exceptional heart care, we have created designated Provider Care Teams.  These Care Teams include your primary Cardiologist (physician) and Advanced Practice Providers (APPs -  Physician Assistants and Nurse Practitioners) who all work together to provide you with the care you need, when you need it.  We recommend signing up for the patient portal called "MyChart".  Sign up information is provided on this After Visit Summary.  MyChart is used to connect with patients for Virtual Visits (Telemedicine).  Patients are able to view lab/test results, encounter notes, upcoming appointments, etc.  Non-urgent messages can be sent to your provider as well.   To learn more about what you can do with MyChart, go to [email or web address].    Your next appointment:   Your physician wants you to follow-up in: 4 months with Dr. Rayann Heman.   05/25/2020 at 10:30 at the [address] office    Other Instructions:

## 2020-02-18 DIAGNOSIS — E1140 Type 2 diabetes mellitus with diabetic neuropathy, unspecified: Secondary | ICD-10-CM | POA: Diagnosis not present

## 2020-02-18 DIAGNOSIS — G5701 Lesion of sciatic nerve, right lower limb: Secondary | ICD-10-CM | POA: Diagnosis not present

## 2020-02-18 DIAGNOSIS — L989 Disorder of the skin and subcutaneous tissue, unspecified: Secondary | ICD-10-CM | POA: Diagnosis not present

## 2020-02-22 ENCOUNTER — Other Ambulatory Visit: Payer: Self-pay | Admitting: Internal Medicine

## 2020-02-22 DIAGNOSIS — S0181XA Laceration without foreign body of other part of head, initial encounter: Secondary | ICD-10-CM | POA: Diagnosis not present

## 2020-02-24 ENCOUNTER — Other Ambulatory Visit: Payer: Self-pay | Admitting: Cardiology

## 2020-03-05 ENCOUNTER — Telehealth: Payer: Self-pay | Admitting: Internal Medicine

## 2020-03-05 NOTE — Telephone Encounter (Signed)
Scott from [facility]  [facility] D Prescription. For Urgent PA request for the Repathasure click 140mg  solution. Insurance approved for one year 03/05/2020 thru 03/05/2021.

## 2020-03-19 ENCOUNTER — Other Ambulatory Visit: Payer: Self-pay

## 2020-03-19 ENCOUNTER — Encounter: Payer: Self-pay | Admitting: Internal Medicine

## 2020-03-19 ENCOUNTER — Ambulatory Visit: Payer: Medicare Other | Admitting: Internal Medicine

## 2020-03-19 VITALS — BP 118/76 | HR 66 | Ht 72.0 in | Wt 212.6 lb

## 2020-03-19 DIAGNOSIS — I10 Essential (primary) hypertension: Secondary | ICD-10-CM

## 2020-03-19 DIAGNOSIS — I4819 Other persistent atrial fibrillation: Secondary | ICD-10-CM

## 2020-03-19 DIAGNOSIS — R002 Palpitations: Secondary | ICD-10-CM | POA: Diagnosis not present

## 2020-03-19 HISTORY — PX: OTHER SURGICAL HISTORY: SHX169

## 2020-03-19 NOTE — Progress Notes (Signed)
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 male who presents today for routine electrophysiology followup.  Since last being seen in our clinic, the patient reports doing very well.  He continues to have palpitations of unclear etiology.  He is not certain if this is afib or not.  Today, he denies symptoms of chest pain, shortness of breath,  lower extremity edema, dizziness, presyncope, or syncope.  The patient is otherwise without complaint today.   Past Medical History:  Diagnosis Date  . Atrial tachycardia ([place])    documented by holter 2016. Followed by Dr. Liane Comber and Dr. Rayann Heman.  . 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 no further tx other than surgery [facility].  [name] Cirrhosis ([place])    [facility] dx, early stage- follow up visits at [facility].  [name] CRI (chronic renal insufficiency)    s/p R nephrectomy in 1999 for malignancy  . Diabetes mellitus without complication (HCC)    NIDDM- dx. 3-4 years ago diet control  . Diverticulosis of colon    mild  . GERD (gastroesophageal reflux disease)   . Headache(784.0)   . Hepatitis C    s/p therapy [facility] (curative)/with Harvoni  . History of BPH    no recent issues  . History of substance abuse ([place])    past history -none in 38 yrs  . Hyperglycemia   . Hypertension   . Osteoarthritis, knee    knees bilaterally   . Pneumonia 1975   hx of 35 years ago   Past Surgical History:  Procedure Laterality Date  . Arthroscopy right knee     may '11 (Dr Alvan Dame)  . Arthroscopy, left knee,    . ATRIAL FIBRILLATION ABLATION N/A 10/11/2018   Procedure: ATRIAL FIBRILLATION ABLATION;  Surgeon: Thompson Grayer, MD;  Location: [place] CV LAB;  Service: Cardiovascular;  Laterality: N/A;  . history of liver biopsy  2007  . History of Nephrectomy Right 1999  . KNEE ARTHROSCOPY Left 10/10/2012   Procedure: LEFT KNEE ARTHROSCOPY WITH MENSCIAL DEBRIDEMENT AND CHONDROPLASTY;  Surgeon: Gearlean Alf, MD;  [facility];   Service: Orthopedics;  Laterality: Left;  . left shoulder repair for bone spur  1990's  . TEE WITHOUT CARDIOVERSION N/A 10/11/2018   Procedure: TRANSESOPHAGEAL ECHOCARDIOGRAM (TEE);  Surgeon: Buford Dresser, MD;  Location: [facility] ENDOSCOPY;  Service: Cardiovascular;  Laterality: N/A;  ablation to follow at 1030  . TOTAL KNEE ARTHROPLASTY Right 08/15/2016   Procedure: RIGHT TOTAL KNEE ARTHROPLASTY, CORTISONE INJECTION OF LEFT KNEE;  Surgeon: Gaynelle Arabian, MD;  [facility];  Service: Orthopedics;  Laterality: Right;  . TOTAL KNEE ARTHROPLASTY Left 09/04/2017   Procedure: LEFT TOTAL KNEE ARTHROPLASTY;  Surgeon: Gaynelle Arabian, MD;  [facility];  Service: Orthopedics;  Laterality: Left;  . Transurethral needle ablation procedure    . TUNA procedure      ROS- all systems are reviewed and negatives except as per HPI above  Current Outpatient Medications  Medication Sig Dispense Refill  . acetaminophen (TYLENOL) 650 MG CR tablet Take 650 mg by mouth at bedtime.     [name] allopurinol (ZYLOPRIM) 100 MG tablet Take 100 mg by mouth every evening.     . Ascorbic Acid (VITAMIN C) 1000 MG tablet Take 1,000 mg by mouth daily.    . carvedilol (COREG) 12.5 MG tablet TAKE 1 TABLET BY MOUTH TWICE DAILY WITH A MEAL. 180 tablet 0  . ELIQUIS 5 MG  TABS tablet TAKE 1 TABLET BY MOUTH TWICE DAILY. 60 tablet 5  . famotidine (PEPCID) 20 MG tablet Take 20 mg by mouth. Taking one tablet once a week    . flecainide (TAMBOCOR) 50 MG tablet Take 1 tablet (50 mg total) by mouth 3 (three) times daily. 270 tablet 0  . gabapentin (NEURONTIN) 600 MG tablet Take 900 mg by mouth at bedtime.     . hydrochlorothiazide (HYDRODIURIL) 25 MG tablet Take 1 tablet (25 mg total) by mouth daily. 90 tablet 2  . Multiple Vitamin (MULTIVITAMIN) capsule Take 1 capsule by mouth daily.    . Multiple Vitamins-Minerals (IMMUNE SUPPORT PO) Take by mouth daily. Taking two tablets daily- Vitamin D, echinacea, zinc and elderberry    .  omeprazole (PRILOSEC) 20 MG capsule Take 20 mg by mouth 3 (three) times a week.    [name] REPATHA SURECLICK 341 MG/ML SOAJ INJECT CONTENTS OF 1 PEN SUBCUTANEOUSLY EVERY 14 DAYS. 2 mL 11   No current facility-administered medications for this visit.    Physical Exam: Vitals:   03/19/20 1201  BP: 118/76  Pulse: 66  SpO2: 96%  Weight: 212 lb 9.6 oz (96.4 kg)  Height: 6' (1.829 m)    GEN- The patient is well appearing, alert and oriented x 3 today.   Head- normocephalic, atraumatic Eyes-  Sclera clear, conjunctiva pink Ears- hearing intact Oropharynx- clear Lungs- Clear to ausculation bilaterally, normal work of breathing Heart- Regular rate and rhythm, no murmurs, rubs or gallops, PMI not laterally displaced GI- soft, NT, ND, + BS Extremities- no clubbing, cyanosis, or edema  Wt Readings from Last 3 Encounters:  03/19/20 212 lb 9.6 oz (96.4 kg)  01/29/20 213 lb (96.6 kg)  09/06/19 218 lb 9.6 oz (99.2 kg)    EKG tracing ordered today is personally reviewed and shows sinus  Assessment and Plan:  1,. Paroxysmal atrial fibrillation Doing well post ablation with flecainide chads2vasc score is 4.  Continue eliquis He has palpitations of unknown cause.  There is concern for possible afib.  I think that we need to further characterize his palpitations to guide management.  I would therefore advise implantation of an implantable loop recorder for long term arrhythmia monitoring.  Risks and benefits to ILR were discussed at length with the patient today, including but not limited to risks of bleeding and infection.  Extensive device education was performed.  Remote monitoring was also discussed at length today.  The patient understands and wishes to proceed.  We will proceed at this time with ILR implantation.  2. HTN Stable No change required today  Thompson Grayer MD, [facility] 03/19/2020 12:10 PM    DESCRIPTION OF PROCEDURE:  Informed written consent was obtained.  The patient required no  sedation for the procedure today.  The patients left chest was prepped and draped. Mapping over the patient's chest was performed to identify the appropriate ILR site.  This area was found to be the left parasternal region over the 3rd-4th intercostal space.  The skin overlying this region was infiltrated with lidocaine for local analgesia.  A 0.5-cm incision was made at the implant site.  A subcutaneous ILR pocket was fashioned using a combination of sharp and blunt dissection.  A Medtronic Reveal Linq model LNQ 22 ([id number] ) implantable loop recorder was then placed into the pocket R waves were very prominent and measured > 0.2 mV. EBL<1 ml.  Steri- Strips and a sterile dressing were then applied.  There were no early apparent  complications.     CONCLUSIONS:   1. Successful implantation of a Medtronic Reveal LINQ implantable loop recorder for evaluation of palpitations and afib management post ablation  2. No early apparent complications.   Thompson Grayer MD, [place] 03/19/2020 12:10 PM

## 2020-03-19 NOTE — Patient Instructions (Addendum)
Medication Instructions:  Your physician recommends that you continue on your current medications as directed. Please refer to the Current Medication list given to you today.  Labwork: None ordered.  Testing/Procedures: None ordered.  Follow-Up:  Your physician wants you to follow-up in: 3 months with Dr. Rayann Heman.    June 17, 2020 at 3:15 pm at the [facility] office    Implantable Loop Recorder Placement, Care After This sheet gives you information about how to care for yourself after your procedure. Your health care provider may also give you more specific instructions. If you have problems or questions, contact your health care provider. What can I expect after the procedure? After the procedure, it is common to have:  Soreness or discomfort near the incision.  Some swelling or bruising near the incision.  Follow these instructions at home: Incision care  1.  Leave your outer dressing on for 24 hours.  After 24 hours you can remove your outer dressing and shower. 2. Leave adhesive strips in place. These skin closures may need to stay in place for 1-2 weeks. If adhesive strip edges start to loosen and curl up, you may trim the loose edges.  You may remove the strips if they have not fallen off after 2 weeks. 3. Check your incision area every day for signs of infection. Check for: a. Redness, swelling, or pain. b. Fluid or blood. c. Warmth. d. Pus or a bad smell. 4. Do not take baths, swim, or use a hot tub until your incision is completely healed. 5. If your wound site starts to bleed apply pressure.      If you have any questions/concerns please call the device clinic at [phone].  Activity  Return to your normal activities.  General instructions  Follow instructions from your health care provider about how to manage your implantable loop recorder and transmit the information. Learn how to activate a recording if this is necessary for your type of device.  Do  not go through a metal detection gate, and do not let someone hold a metal detector over your chest. Show your ID card.  Do not have an MRI unless you check with your health care provider first.  Take over-the-counter and prescription medicines only as told by your health care provider.  Keep all follow-up visits as told by your health care provider. This is important. Contact a health care provider if:  You have redness, swelling, or pain around your incision.  You have a fever.  You have pain that is not relieved by your pain medicine.  You have triggered your device because of fainting (syncope) or because of a heartbeat that feels like it is racing, slow, fluttering, or skipping (palpitations). Get help right away if you have:  Chest pain.  Difficulty breathing. Summary  After the procedure, it is common to have soreness or discomfort near the incision.  Change your dressing as told by your health care provider.  Follow instructions from your health care provider about how to manage your implantable loop recorder and transmit the information.  Keep all follow-up visits as told by your health care provider. This is important. This information is not intended to replace advice given to you by your health care provider. Make sure you discuss any questions you have with your health care provider. Document Released: 06/29/2015 Document Revised: 09/02/2017 Document Reviewed: 09/02/2017 Elsevier Patient Education  2020 [employer].

## 2020-04-07 ENCOUNTER — Encounter: Payer: Self-pay | Admitting: Cardiology

## 2020-04-07 ENCOUNTER — Other Ambulatory Visit: Payer: Self-pay

## 2020-04-07 ENCOUNTER — Ambulatory Visit: Payer: Medicare Other | Admitting: Cardiology

## 2020-04-07 VITALS — BP 118/76 | HR 65 | Ht 72.0 in | Wt 212.8 lb

## 2020-04-07 DIAGNOSIS — Z79899 Other long term (current) drug therapy: Secondary | ICD-10-CM

## 2020-04-07 DIAGNOSIS — I712 Thoracic aortic aneurysm, without rupture: Secondary | ICD-10-CM | POA: Diagnosis not present

## 2020-04-07 DIAGNOSIS — E1121 Type 2 diabetes mellitus with diabetic nephropathy: Secondary | ICD-10-CM

## 2020-04-07 DIAGNOSIS — I10 Essential (primary) hypertension: Secondary | ICD-10-CM | POA: Diagnosis not present

## 2020-04-07 DIAGNOSIS — I7121 Aneurysm of the ascending aorta, without rupture: Secondary | ICD-10-CM

## 2020-04-07 DIAGNOSIS — E782 Mixed hyperlipidemia: Secondary | ICD-10-CM | POA: Diagnosis not present

## 2020-04-07 NOTE — Progress Notes (Signed)
Cardiology Office Note:    Date:  04/07/2020   ID:  Timothy Dickson, DOB 1946/01/24, MRN [id number]  PCP:  Antony Contras, MD  [facility] Cardiologist:  Ena Dawley, MD  [facility] Electrophysiologist:  Thompson Grayer, MD   Referring MD: Antony Contras, MD   Reason for visit:   History of Present Illness:    Timothy Dickson is a 74 y.o. male with a hx of  hypertension, ascending aortic aneurysm, hyperlipidemia, coronary calcifications, persistent atrial fibrillation and SVT.  They have failed medical therapy with Flecainide and underwent EPS/RFCA of atrial fibrillation in 09/2018.  This patients CHA2DS2-VASc Score and unadjusted Ischemic Stroke Rate (% per year) is equal to 2.2 % stroke rate/year from a score of 2 Above score calculated as 1 point each if present [CHF, HTN, DM, Vascular=MI/PAD/Aortic Plaque, Age if 65-74, or Male] Above score calculated as 2 points each if present [Age > 75, or Stroke/TIA/TE] The patient underwent A. fib ablation in March 2020 and it was successful.  He continues to take carvedilol and flecainide, he has been recently been experiencing palpitation and is being followed by Dr. Rayann Heman, who implanted a loop monitor a week ago.  He has not had any palpitations since then.  His wound is healing well with some mild bruising around it.  He denies any dizziness presyncope or seen he denies any orthostatic hypotension.  He had a small wound on his face that his primary care physician cauterized.  He denies any chest pain, shortness of breath no lower extremity edema orthopnea or proximal nocturnal dyspnea.  Past Medical History:  Diagnosis Date  . Atrial tachycardia ([place])    documented by holter 2016. Followed by Dr. Liane Comber and Dr. Rayann Heman.  . 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 no further tx other than surgery [facility].  [name] Cirrhosis ([facility])    [facility] dx, early stage- follow up visits [facility]  [name] CRI (chronic renal insufficiency)    s/p R  nephrectomy in 1999 for malignancy  . Diabetes mellitus without complication (HCC)    NIDDM- dx. 3-4 years ago diet control  . Diverticulosis of colon    mild  . GERD (gastroesophageal reflux disease)   . Headache(784.0)   . Hepatitis C    s/p therapy [facility] (curative)/with Harvoni  . History of BPH    no recent issues  . History of substance abuse ([place])    past history -none in 38 yrs  . Hyperglycemia   . Hypertension   . Osteoarthritis, knee    knees bilaterally   . Pneumonia 1975   hx of 35 years ago    Past Surgical History:  Procedure Laterality Date  . Arthroscopy right knee     may '11 (Dr Alvan Dame)  . Arthroscopy, left knee,    . ATRIAL FIBRILLATION ABLATION N/A 10/11/2018   Procedure: ATRIAL FIBRILLATION ABLATION;  Surgeon: Thompson Grayer, MD;  Location: [facility];  Service: Cardiovascular;  Laterality: N/A;  . history of liver biopsy  2007  . History of Nephrectomy Right 1999  . implantable loop recorder placement  03/19/2020    Medtronic Reveal Linq model LNQ 22 (RLB [id number] ) implantable loop recorder implanted by Dr Rayann Heman for evaluation of palpitations and afib management post ablation  . KNEE ARTHROSCOPY Left 10/10/2012   Procedure: LEFT KNEE ARTHROSCOPY WITH MENSCIAL DEBRIDEMENT AND CHONDROPLASTY;  Surgeon: Gearlean Alf, MD;  Location: [facility];  Service: Orthopedics;  Laterality: Left;  .  left shoulder repair for bone spur  1990's  . TEE WITHOUT CARDIOVERSION N/A 10/11/2018   Procedure: TRANSESOPHAGEAL ECHOCARDIOGRAM (TEE);  Surgeon: Buford Dresser, MD;  Location: [facility];  Service: Cardiovascular;  Laterality: N/A;  ablation to follow at 1030  . TOTAL KNEE ARTHROPLASTY Right 08/15/2016   Procedure: RIGHT TOTAL KNEE ARTHROPLASTY, CORTISONE INJECTION OF LEFT KNEE;  Surgeon: Gaynelle Arabian, MD;  Location: [facility];  Service: Orthopedics;  Laterality: Right;  . TOTAL KNEE ARTHROPLASTY Left 09/04/2017   Procedure: LEFT TOTAL KNEE ARTHROPLASTY;  Surgeon:  Gaynelle Arabian, MD;  Location: [facility];  Service: Orthopedics;  Laterality: Left;  . Transurethral needle ablation procedure    . TUNA procedure      Current Medications: Current Meds  Medication Sig  . acetaminophen (TYLENOL) 650 MG CR tablet Take 650 mg by mouth at bedtime.   [name] allopurinol (ZYLOPRIM) 100 MG tablet Take 100 mg by mouth every evening.   . Ascorbic Acid (VITAMIN C) 1000 MG tablet Take 1,000 mg by mouth daily.  . carvedilol (COREG) 12.5 MG tablet TAKE 1 TABLET BY MOUTH TWICE DAILY WITH A MEAL.  [name] ELIQUIS 5 MG TABS tablet TAKE 1 TABLET BY MOUTH TWICE DAILY.  . famotidine (PEPCID) 20 MG tablet Take 20 mg by mouth. Taking one tablet once a week  . flecainide (TAMBOCOR) 50 MG tablet Take 1 tablet (50 mg total) by mouth 3 (three) times daily.  [name] gabapentin (NEURONTIN) 600 MG tablet Take 1,200 mg by mouth at bedtime.   . hydrochlorothiazide (HYDRODIURIL) 25 MG tablet Take 1 tablet (25 mg total) by mouth daily.  . Multiple Vitamin (MULTIVITAMIN) capsule Take 1 capsule by mouth daily.  . Multiple Vitamins-Minerals (IMMUNE SUPPORT PO) Take by mouth daily. Taking two tablets daily- Vitamin D, echinacea, zinc and elderberry  . omeprazole (PRILOSEC) 20 MG capsule Take 20 mg by mouth 3 (three) times a week.  [name] REPATHA SURECLICK 629 MG/ML SOAJ INJECT CONTENTS OF 1 PEN SUBCUTANEOUSLY EVERY 14 DAYS.     Allergies:   Statins, Acetaminophen, Aspirin, Nsaids, and Pravastatin   Social History   Socioeconomic History  . Marital status: Married    Spouse name: Not on file  . Number of children: Not on file  . Years of education: Not on file  . Highest education level: Not on file  Occupational History  . Not on file  Tobacco Use  . Smoking status: Former Smoker    Packs/day: 1.00    Years: 15.00    Pack years: 15.00    Types: Cigarettes    Quit date: 08/01/1977    Years since quitting: 42.7  . Smokeless tobacco: Never Used  Vaping Use  . Vaping Use: Never used  Substance and  Sexual Activity  . Alcohol use: No    Comment: past history -none in 38 yrs  . Drug use: No    Comment: past hx none in 38 yrs  . Sexual activity: Yes    Partners: Female  Other Topics Concern  . Not on file  Social History Narrative   [place], [place] psych. Married- '84. 2 step-daughters, 4 grandchildren. Work  Retired Radio producer  Marriage in good health.   Social Determinants of Health   Financial Resource Strain:   . Difficulty of Paying Living Expenses: Not on file  Food Insecurity:   . Worried About Charity fundraiser in the Last Year: Not on file  . Ran Out of Food in the  Last Year: Not on file  Transportation Needs:   . Lack of Transportation (Medical): Not on file  . Lack of Transportation (Non-Medical): Not on file  Physical Activity:   . Days of Exercise per Week: Not on file  . Minutes of Exercise per Session: Not on file  Stress:   . Feeling of Stress : Not on file  Social Connections:   . Frequency of Communication with Friends and Family: Not on file  . Frequency of Social Gatherings with Friends and Family: Not on file  . Attends Religious Services: Not on file  . Active Member of Clubs or Organizations: Not on file  . Attends Archivist Meetings: Not on file  . Marital Status: Not on file     Family History: The patient's family history includes Alcohol abuse in his father, sister, and another family member; Breast cancer in his sister; CVA in his mother; Cancer in his brother; Coronary artery disease in his mother; Diabetes in his sister; Esophageal cancer in his sister; Lung cancer in his sister; Prostate cancer in his brother.  ROS:   Please see the history of present illness.    All other systems reviewed and are negative.  EKGs/Labs/Other Studies Reviewed:    The following studies were reviewed today:  EKG:  EKG is not ordered today.    Recent Labs: 05/28/2019: ALT 17; BUN 24; Creatinine, Ser 1.30;  Potassium 4.4; Sodium 137  Recent Lipid Panel    Component Value Date/Time   CHOL 79 (L) 05/28/2019 1125   CHOL 206 (H) 12/21/2015 1522   TRIG 63 05/28/2019 1125   TRIG 161 (H) 12/21/2015 1522   HDL 46 05/28/2019 1125   HDL 45 12/21/2015 1522   CHOLHDL 1.7 05/28/2019 1125   CHOLHDL 2.6 03/16/2016 1055   VLDL 14 03/16/2016 1055   LDLCALC 18 05/28/2019 1125   LDLCALC 129 12/21/2015 1522   Physical Exam:    VS:  BP 118/76   Pulse 65   Ht 6' (1.829 m)   Wt 212 lb 12.8 oz (96.5 kg)   SpO2 98%   BMI 28.86 kg/m     Wt Readings from Last 3 Encounters:  04/07/20 212 lb 12.8 oz (96.5 kg)  03/19/20 212 lb 9.6 oz (96.4 kg)  01/29/20 213 lb (96.6 kg)    GEN: Well nourished, well developed in no acute distress HEENT: Normal NECK: No JVD; No carotid bruits LYMPHATICS: No lymphadenopathy CARDIAC: Abnormal RRR, no murmurs, rubs, gallops RESPIRATORY:  Clear to auscultation without rales, wheezing or rhonchi  ABDOMEN: Soft, non-tender, non-distended MUSCULOSKELETAL:  No edema; No deformity  SKIN: Warm and dry NEUROLOGIC:  Alert and oriented x 3 PSYCHIATRIC:  Normal affect   ASSESSMENT:    1. Ascending aortic aneurysm ([place])   2. Essential (primary) hypertension   3. Mixed hyperlipidemia   4. Type 2 diabetes mellitus with diabetic nephropathy, unspecified whether long term insulin use ([place])   5. Medication management    PLAN:    In order of problems listed above:  1.  Paroxysmal atrial fibrillation -Status post successful ablation in March 2020 now with recurrent palpitations, status post loop recorder placement, on Eliquis with no bleeding, on flecainide and carvedilol.  He would like to eventually have decreased dose of carvedilol or have it discontinued given the fatigue that it gives him.  2.  HTN -controlled  3.  Hx of ascending aortic aneurysm stable on last MRI measuring 42 mm and no aortic regurgitation.  [place]  mm has been stable for last several years, given his height  and age 44 mm is upper normal limit, I will defer testing this year in order 1 year from now.    His echocardiogram last year showed no aortic regurgitation.  4.  Hyperlipidemia -he was intolerant to statins, he is now tolerating Repatha really well, we will check his lipids and LFTs today.  5.  Coronary calcifications -seen on calcium score in 2017, currently on Repatha.  Medication Adjustments/Labs and Tests Ordered: Current medicines are reviewed at length with the patient today.  Concerns regarding medicines are outlined above.  Orders Placed This Encounter  Procedures  . MR Angiogram Chest W Wo Contrast  . CBC  . Comp Met (CMET)  . TSH  . Lipid Profile  . HgB A1c   No orders of the defined types were placed in this encounter.   Patient Instructions  Medication Instructions:   Your physician recommends that you continue on your current medications as directed. Please refer to the Current Medication list given to you today.  *If you need a refill on your cardiac medications before your next appointment, please call your pharmacy*  Lab Work:  TODAY--CMET, CBC, TSH, HEMOGLOBIN A1C, AND LIPIDS  If you have labs (blood work) drawn today and your tests are completely normal, you will receive your results only by: [name] MyChart Message (if you have MyChart) OR . A paper copy in the mail If you have any lab test that is abnormal or we need to change your treatment, we will call you to review the results.   Testing/Procedures:  MRA OF THE CHEST WITH/WITHOUT CONTRAST TO BE DONE IN ONE YEAR PER DR. Meda Coffee  Follow-Up: At [facility], you and your health needs are our priority.  As part of our continuing mission to provide you with exceptional heart care, we have created designated Provider Care Teams.  These Care Teams include your primary Cardiologist (physician) and Advanced Practice Providers (APPs -  Physician Assistants and Nurse Practitioners) who all work together to provide  you with the care you need, when you need it.  We recommend signing up for the patient portal called "MyChart".  Sign up information is provided on this After Visit Summary.  MyChart is used to connect with patients for Virtual Visits (Telemedicine).  Patients are able to view lab/test results, encounter notes, upcoming appointments, etc.  Non-urgent messages can be sent to your provider as well.   To learn more about what you can do with MyChart, go to [email or web address].    Your next appointment:   12 month(s)  The format for your next appointment:   In Person  Provider:   Ena Dawley, MD        Signed, Ena Dawley, MD  04/07/2020 6:20 PM    [place]

## 2020-04-07 NOTE — Patient Instructions (Addendum)
Medication Instructions:   Your physician recommends that you continue on your current medications as directed. Please refer to the Current Medication list given to you today.  *If you need a refill on your cardiac medications before your next appointment, please call your pharmacy*  Lab Work:  TODAY--CMET, CBC, TSH, HEMOGLOBIN A1C, AND LIPIDS  If you have labs (blood work) drawn today and your tests are completely normal, you will receive your results only by: [name] MyChart Message (if you have MyChart) OR . A paper copy in the mail If you have any lab test that is abnormal or we need to change your treatment, we will call you to review the results.   Testing/Procedures:  MRA OF THE CHEST WITH/WITHOUT CONTRAST TO BE DONE IN ONE YEAR PER DR. Meda Coffee  Follow-Up: At [facility], you and your health needs are our priority.  As part of our continuing mission to provide you with exceptional heart care, we have created designated Provider Care Teams.  These Care Teams include your primary Cardiologist (physician) and Advanced Practice Providers (APPs -  Physician Assistants and Nurse Practitioners) who all work together to provide you with the care you need, when you need it.  We recommend signing up for the patient portal called "MyChart".  Sign up information is provided on this After Visit Summary.  MyChart is used to connect with patients for Virtual Visits (Telemedicine).  Patients are able to view lab/test results, encounter notes, upcoming appointments, etc.  Non-urgent messages can be sent to your provider as well.   To learn more about what you can do with MyChart, go to [email or web address].    Your next appointment:   12 month(s)  The format for your next appointment:   In Person  Provider:   Ena Dawley, MD

## 2020-04-08 LAB — COMPREHENSIVE METABOLIC PANEL
ALT: 18 IU/L (ref 0–44)
AST: 29 IU/L (ref 0–40)
Albumin/Globulin Ratio: 2 (ref 1.2–2.2)
Albumin: 4.6 g/dL (ref 3.7–4.7)
Alkaline Phosphatase: 72 IU/L (ref 48–121)
BUN/Creatinine Ratio: 20 (ref 10–24)
BUN: 21 mg/dL (ref 8–27)
Bilirubin Total: 0.7 mg/dL (ref 0.0–1.2)
CO2: 24 mmol/L (ref 20–29)
Calcium: 9.6 mg/dL (ref 8.6–10.2)
Chloride: 95 mmol/L — ABNORMAL LOW (ref 96–106)
Creatinine, Ser: 1.04 mg/dL (ref 0.76–1.27)
GFR calc Af Amer: 81 mL/min/{1.73_m2} (ref >59)
GFR calc non Af Amer: 70 mL/min/{1.73_m2} (ref >59)
Globulin, Total: 2.3 g/dL (ref 1.5–4.5)
Glucose: 139 mg/dL — ABNORMAL HIGH (ref 65–99)
Potassium: 3.8 mmol/L (ref 3.5–5.2)
Sodium: 134 mmol/L (ref 134–144)
Total Protein: 6.9 g/dL (ref 6.0–8.5)

## 2020-04-08 LAB — CBC
Hematocrit: 43 % (ref 37.5–51.0)
Hemoglobin: 15.3 g/dL (ref 13.0–17.7)
MCH: 32.6 pg (ref 26.6–33.0)
MCHC: 35.6 g/dL (ref 31.5–35.7)
MCV: 92 fL (ref 79–97)
Platelets: 236 10*3/uL (ref 150–450)
RBC: 4.7 x10E6/uL (ref 4.14–5.80)
RDW: 12.4 % (ref 11.6–15.4)
WBC: 8.2 10*3/uL (ref 3.4–10.8)

## 2020-04-08 LAB — LIPID PANEL
Chol/HDL Ratio: 2.3 ratio (ref 0.0–5.0)
Cholesterol, Total: 108 mg/dL (ref 100–199)
HDL: 48 mg/dL (ref >39)
LDL Chol Calc (NIH): 41 mg/dL (ref 0–99)
Triglycerides: 103 mg/dL (ref 0–149)
VLDL Cholesterol Cal: 19 mg/dL (ref 5–40)

## 2020-04-08 LAB — HEMOGLOBIN A1C
Est. average glucose Bld gHb Est-mCnc: 157 mg/dL
Hgb A1c MFr Bld: 7.1 % — ABNORMAL HIGH (ref 4.8–5.6)

## 2020-04-08 LAB — TSH: TSH: 1.01 u[IU]/mL (ref 0.450–4.500)

## 2020-04-12 ENCOUNTER — Other Ambulatory Visit: Payer: Self-pay | Admitting: Internal Medicine

## 2020-04-14 NOTE — Telephone Encounter (Signed)
Pt saw Dr Meda Coffee 04/07/20.  Age 74  Wt 96.5kg  Labs done 04/07/20:  SCr 1.04  Hgb 15.3  Hct 43.0  Plts 236 Eliquis dose of 5mg  twice daily is appropriate.  Refill approved.

## 2020-04-17 DIAGNOSIS — M47812 Spondylosis without myelopathy or radiculopathy, cervical region: Secondary | ICD-10-CM | POA: Diagnosis not present

## 2020-04-20 ENCOUNTER — Encounter: Payer: Self-pay | Admitting: Neurology

## 2020-04-20 ENCOUNTER — Other Ambulatory Visit: Payer: Self-pay

## 2020-04-20 ENCOUNTER — Ambulatory Visit: Payer: Medicare Other | Admitting: Neurology

## 2020-04-20 VITALS — BP 138/84 | HR 72 | Resp 18 | Ht 72.0 in | Wt 218.0 lb

## 2020-04-20 DIAGNOSIS — E1142 Type 2 diabetes mellitus with diabetic polyneuropathy: Secondary | ICD-10-CM | POA: Diagnosis not present

## 2020-04-20 DIAGNOSIS — G5623 Lesion of ulnar nerve, bilateral upper limbs: Secondary | ICD-10-CM

## 2020-04-20 DIAGNOSIS — G5622 Lesion of ulnar nerve, left upper limb: Secondary | ICD-10-CM

## 2020-04-20 DIAGNOSIS — G5603 Carpal tunnel syndrome, bilateral upper limbs: Secondary | ICD-10-CM

## 2020-04-20 NOTE — Patient Instructions (Addendum)
You can try over-the-counter lidocaine cream to your feet  Continue gabapentin 600mg  at nighttime and early morning  Check feet daily  Return to clinic as needed

## 2020-04-20 NOTE — Progress Notes (Signed)
[facility] Clinic Note - Initial Visit   Date: 04/20/20  Timothy Dickson MRN: [id number] DOB: 1946-01-25   Dear Dr. Moreen Fowler:  Thank you for your kind referral of Timothy Dickson for consultation of neuropathy. Although his history is well known to you, please allow [place] to reiterate it for the purpose of our medical record. The patient was accompanied to the clinic by self.   History of Present Illness: Timothy Dickson is a 74 y.o. left-handed male with atrial fibrillation, GERD, well-controlled diabetes mellitus, CKD, and hypertension presenting for evaluation of neuropathy.   He has long history of neuropathy manifesting with tingling and numbness in the feet and lower legs.  Symptoms are worse in the evening and nighttime.  He takes gabapentin 600mg  at nighttime and 600mg  at 4am which provides some relief.  He wakes up around 4am to use the bathroom, not because his pain wakes him up. Remote history of alcohol use, quit at the age of 66.  He denies weakness.  He has some imbalance, no falls and walks unassisted.  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  He has adjusted positioning at nighttime and no longer has hand paresthesias.    Out-side paper records, electronic medical record, and images have been reviewed where available and summarized as:  NCS/EMG of the arms 10/13/2014: 1. 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 Overall, these findings are moderate in degree electrically. 2. 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 Mild to moderate in degree electrically.  3. Bilateral median neuropathy at or distal to the wrist, consistent with the clinical diagnosis of carpal tunnel syndrome. Overall, these findings are mild to moderate in degree electrically, and worse on the  right.   Lab Results  Component Value Date   HGBA1C 7.1 (H) 04/07/2020   No results found for: VITAMINB12 Lab Results  Component Value Date   TSH 1.010 04/07/2020   No results found for: ESRSEDRATE, POCTSEDRATE  Past Medical History:  Diagnosis Date  . Atrial tachycardia ([place])    documented by holter 2016. Followed by Dr. Liane Comber and Dr. Rayann Heman.  . 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 no further tx other than surgery [facility].  [name] Cirrhosis ([place])    [facility] dx, early stage- follow up visits at [facility].  [name] CRI (chronic renal insufficiency)    s/p R nephrectomy in 1999 for malignancy  . Diabetes mellitus without complication (H[facility])    NIDDM- dx. 3-4 years ago diet control  . Diverticulosis of colon    mild  . GERD (gastroesophageal reflux disease)   . Headache(784.0)   . Hepatitis C    s/p therapy [facility] (curative)/with Harvoni  . History of BPH    no recent issues  . History of substance abuse ([place])    past history -none in 38 yrs  . Hyperglycemia   . Hypertension   . Osteoarthritis, knee    knees bilaterally   . Pneumonia 1975   hx of 35 years ago    Past Surgical History:  Procedure Laterality Date  . Arthroscopy right knee     may '11 (Dr Alvan Dame)  . Arthroscopy, left knee,    . ATRIAL FIBRILLATION ABLATION N/A 10/11/2018   Procedure: ATRIAL FIBRILLATION ABLATION;  Surgeon: Thompson Grayer, MD;  Location: [facility];  Service: Cardiovascular;  Laterality: N/A;  . history of liver biopsy  2007  . History of Nephrectomy Right 1999  . implantable loop recorder placement  03/19/2020    Medtronic Reveal Linq model LNQ 22 ([id number] ) implantable loop recorder implanted by Dr Rayann Heman for evaluation of palpitations and afib management post ablation  . KNEE ARTHROSCOPY Left 10/10/2012   Procedure: LEFT KNEE ARTHROSCOPY WITH MENSCIAL DEBRIDEMENT AND CHONDROPLASTY;  Surgeon: Gearlean Alf, MD;  Location: [facility];  Service: Orthopedics;  Laterality: Left;  . left  shoulder repair for bone spur  1990's  . TEE WITHOUT CARDIOVERSION N/A 10/11/2018   Procedure: TRANSESOPHAGEAL ECHOCARDIOGRAM (TEE);  Surgeon: Buford Dresser, MD;  Location: [facility] ENDOSCOPY;  Service: Cardiovascular;  Laterality: N/A;  ablation to follow at 1030  . TOTAL KNEE ARTHROPLASTY Right 08/15/2016   Procedure: RIGHT TOTAL KNEE ARTHROPLASTY, CORTISONE INJECTION OF LEFT KNEE;  Surgeon: Gaynelle Arabian, MD;  Location: [facility];  Service: Orthopedics;  Laterality: Right;  . TOTAL KNEE ARTHROPLASTY Left 09/04/2017   Procedure: LEFT TOTAL KNEE ARTHROPLASTY;  Surgeon: Gaynelle Arabian, MD;  Location: [facility];  Service: Orthopedics;  Laterality: Left;  . Transurethral needle ablation procedure    . TUNA procedure       Medications:  Outpatient Encounter Medications as of 04/20/2020  Medication Sig  . acetaminophen (TYLENOL) 650 MG CR tablet Take 650 mg by mouth at bedtime.   [name] allopurinol (ZYLOPRIM) 100 MG tablet Take 100 mg by mouth every evening.   . Ascorbic Acid (VITAMIN C) 1000 MG tablet Take 1,000 mg by mouth daily.  . carvedilol (COREG) 12.5 MG tablet TAKE 1 TABLET BY MOUTH TWICE DAILY WITH A MEAL.  [name] ELIQUIS 5 MG TABS tablet TAKE 1 TABLET BY MOUTH TWICE DAILY.  . famotidine (PEPCID) 20 MG tablet Take 20 mg by mouth. Taking one tablet once a week  . flecainide (TAMBOCOR) 50 MG tablet Take 1 tablet (50 mg total) by mouth 3 (three) times daily.  [name] gabapentin (NEURONTIN) 600 MG tablet Take 1,200 mg by mouth at bedtime.   . hydrochlorothiazide (HYDRODIURIL) 25 MG tablet Take 1 tablet (25 mg total) by mouth daily.  . Multiple Vitamin (MULTIVITAMIN) capsule Take 1 capsule by mouth daily.  . Multiple Vitamins-Minerals (IMMUNE SUPPORT PO) Take by mouth daily. Taking two tablets daily- Vitamin D, echinacea, zinc and elderberry  . omeprazole (PRILOSEC) 20 MG capsule Take 20 mg by mouth 3 (three) times a week.  [name] REPATHA SURECLICK 300 MG/ML SOAJ INJECT CONTENTS OF 1 PEN SUBCUTANEOUSLY EVERY 14 DAYS.    No facility-administered encounter medications on file as of 04/20/2020.    Allergies:  Allergies  Allergen Reactions  . Statins     Due to damaged liver  . Acetaminophen Other (See Comments)    Pt states he was told by [facility] MD's to keep tylenol dosage to 2000 mg per 24 hours due to cirrhosis of liver.  . Aspirin Other (See Comments)    Pt has only 1 kidney - been told to stay away from Aspirin   . Nsaids     Avoid due to having 1 kidney   . Pravastatin Other (See Comments)    Pt reports this med makes him generally feel unwell.    Family History: Family History  Problem Relation Age of Onset  . Coronary artery disease Mother   . CVA Mother   . Prostate cancer Brother   . Alcohol abuse Father        variceal  hemorrhage  . Alcohol abuse Sister   . Alcohol abuse Other        Strong family Hx  . Diabetes Sister   . Breast cancer Sister   . Lung cancer Sister   . Esophageal cancer Sister   . Cancer Brother        head and neck    Social History: Social History   Tobacco Use  . Smoking status: Former Smoker    Packs/day: 1.00    Years: 15.00    Pack years: 15.00    Types: Cigarettes    Quit date: 08/01/1977    Years since quitting: 42.7  . Smokeless tobacco: Never Used  Vaping Use  . Vaping Use: Never used  Substance Use Topics  . Alcohol use: No    Comment: past history -none in 38 yrs  . Drug use: No    Comment: past hx none in 40 yrs   Social History   Social History Narrative   [place] BA, [place], [place] psych. Married- '84. 2 step-daughters, 4 grandchildren. Work  Retired Radio producer  Marriage in good health.   Two story home   Drinks caffeine   Ambidextrous     Vital Signs:  BP 138/84   Pulse 72   Resp 18   Ht 6' (1.829 m)   Wt 218 lb (98.9 kg)   SpO2 96%   BMI 29.57 kg/m    Neurological Exam: MENTAL STATUS including orientation to time, place, person, recent and remote memory, attention span and concentration,  language, and fund of knowledge is normal.  Speech is not dysarthric.  CRANIAL NERVES: II:  No visual field defects.  III-IV-VI: Pupils equal round and reactive to light.  Normal conjugate, extra-ocular eye movements in all directions of gaze.  No nystagmus.  No ptosis.   V:  Normal facial sensation.    VII:  Normal facial symmetry and movements.   VIII:  Normal hearing and vestibular function.   IX-X:  Normal palatal movement.   XI:  Normal shoulder shrug and head rotation.   XII:  Normal tongue strength and range of motion, no deviation or fasciculation.  MOTOR:  No atrophy, fasciculations or abnormal movements.  No pronator drift.   Upper Extremity:  Right  Left  Deltoid  5/5   5/5   Biceps  5/5   5/5   Triceps  5/5   5/5   Infraspinatus 5/5  5/5  Medial pectoralis 5/5  5/5  Wrist extensors  5/5   5/5   Wrist flexors  5/5   5/5   Finger extensors  5/5   5/5   Finger flexors  5/5   5/5   Dorsal interossei  5/5   5/5   Abductor pollicis  5/5   5/5   Tone (Ashworth scale)  0  0   Lower Extremity:  Right  Left  Hip flexors  5/5   5/5   Hip extensors  5/5   5/5   Adductor 5/5  5/5  Abductor 5/5  5/5  Knee flexors  5/5   5/5   Knee extensors  5/5   5/5   Dorsiflexors  5/5   5/5   Plantarflexors  5/5   5/5   Toe extensors  5/5   5/5   Toe flexors  5/5   5/5   Tone (Ashworth scale)  0  0   MSRs:  Right        Left  brachioradialis 2+  2+  biceps 2+  2+  triceps 2+  2+  patellar 2+  2+  ankle jerk 0  0  Hoffman no  no  plantar response down  down   SENSORY:  Vibration is absent at the ankles bilaterally, temperature and pin prick reduced distal to ankles.  Rhomberg sign is very mildly positive.  COORDINATION/GAIT: Normal finger-to- nose-finger.  Intact rapid alternating movements bilaterally.  Able to rise from a chair without using arms.  Gait narrow based and stable. Tandem and stressed gait intact.    IMPRESSION: 1. Peripheral neuropathy  contributed by diabetes and remote history of alcohol use.  Discussed the nature of neuropathy, management options, and prognosis  - Medication adjustment discussed and ultimately decided to stay on gabapentin 600mg  at nighttime and 600mg  at 4am.  With his renal dysfunction, do not titrate higher  - Do not recommend TCAs due to cardiac arrhythmia  - He can try OTC lidocaine ointment to feet for breakthrough pain  - Patient educated on daily foot inspection, fall prevention, and safety precautions around the home.  2. Bilateral cubital tunnel syndrome, reports mild symptoms  - Conservative therapies discussed   3. Bilateral carpal tunnel syndrome, asymptomatic  Return to clinic as needed  Total time spent reviewing records, interview, history/exam, documentation, counseling and coordination of care on day of encounter:  45 min   Thank you for allowing me to participate in patient's care.  If I can answer any additional questions, I would be pleased to do so.    Sincerely,    Takeshia Wenk K. Posey Pronto, DO

## 2020-04-22 ENCOUNTER — Ambulatory Visit (INDEPENDENT_AMBULATORY_CARE_PROVIDER_SITE_OTHER): Payer: Medicare Other | Admitting: *Deleted

## 2020-04-22 DIAGNOSIS — I480 Paroxysmal atrial fibrillation: Secondary | ICD-10-CM

## 2020-04-22 LAB — CUP PACEART REMOTE DEVICE CHECK
Date Time Interrogation Session: 20210921195735
Implantable Pulse Generator Implant Date: 20210819

## 2020-04-24 NOTE — Progress Notes (Signed)
Carelink Summary Report / Loop Recorder 

## 2020-04-27 ENCOUNTER — Telehealth (HOSPITAL_COMMUNITY): Payer: Self-pay | Admitting: *Deleted

## 2020-04-27 NOTE — Telephone Encounter (Signed)
Patient called in stating he went into AF this morning around 2 am - sent transmission via his linq - confirmed AF HRs in the 130s. Pt had already taken 50mg  of flecainide and 6.25mg  of coreg at 8am this morning - BP 100/60.  When I called pt back to discuss recommendations from [facility] pt had taken another 12.5mg  of coreg and 50mg  of flecainide he did hold his HCTZ.  Discussed above with Audry Pili - will give extra 50mg  of flecainide now and back to normal dosing this evening. Encouraged increase PO intake today to help with BP since he took extra coreg with lower BP.  Pt will call tomorrow with update. Pt in agreement.

## 2020-05-06 ENCOUNTER — Other Ambulatory Visit: Payer: Self-pay

## 2020-05-06 ENCOUNTER — Ambulatory Visit (HOSPITAL_COMMUNITY)
Admission: RE | Admit: 2020-05-06 | Discharge: 2020-05-06 | Disposition: A | Discharge status: Home / Self Care | Payer: Medicare Other | Source: Ambulatory Visit | Attending: Physician Assistant | Admitting: Physician Assistant

## 2020-05-06 ENCOUNTER — Other Ambulatory Visit (HOSPITAL_COMMUNITY): Payer: Self-pay | Admitting: *Deleted

## 2020-05-06 VITALS — BP 118/72 | HR 59

## 2020-05-06 DIAGNOSIS — I4510 Unspecified right bundle-branch block: Secondary | ICD-10-CM | POA: Insufficient documentation

## 2020-05-06 DIAGNOSIS — I480 Paroxysmal atrial fibrillation: Secondary | ICD-10-CM

## 2020-05-06 DIAGNOSIS — I4891 Unspecified atrial fibrillation: Secondary | ICD-10-CM | POA: Diagnosis not present

## 2020-05-06 DIAGNOSIS — Z79899 Other long term (current) drug therapy: Secondary | ICD-10-CM | POA: Diagnosis not present

## 2020-05-06 MED ORDER — FLECAINIDE ACETATE 100 MG PO TABS: 100.0000 mg | ORAL_TABLET | Freq: Two times a day (BID) | ORAL | 6 refills | Status: DC

## 2020-05-06 NOTE — Progress Notes (Signed)
Patient returns for ECG after increasing flecainide. ECG shows SB HR 59, inc RBBB, PR 180, QRS 104, QTc 437. Patient feels well on the higher dose with no further episodes of afib. Continue flecainide 100 mg BID. F/u with Dr Rayann Heman as scheduled.

## 2020-05-11 ENCOUNTER — Telehealth: Payer: Self-pay

## 2020-05-11 NOTE — Telephone Encounter (Signed)
Recieveing frequent alerts for AF episodes. Patient has ILR implanted for AF?/palpitations. Could we make adjustments to alerts within device?  Currently set at AT/AF recording threshold >= 6 minutes.

## 2020-05-13 NOTE — Telephone Encounter (Signed)
Ok to adjust to longest episode only.  Also ok to adjust afib sensitivity/ ectopy rejection.

## 2020-05-15 ENCOUNTER — Ambulatory Visit: Payer: Medicare Other | Admitting: Internal Medicine

## 2020-05-15 ENCOUNTER — Encounter: Payer: Self-pay | Admitting: *Deleted

## 2020-05-15 ENCOUNTER — Other Ambulatory Visit: Payer: Self-pay

## 2020-05-15 ENCOUNTER — Encounter: Payer: Self-pay | Admitting: Internal Medicine

## 2020-05-15 VITALS — BP 98/60 | HR 57 | Ht 72.0 in | Wt 216.8 lb

## 2020-05-15 DIAGNOSIS — I480 Paroxysmal atrial fibrillation: Secondary | ICD-10-CM | POA: Diagnosis not present

## 2020-05-15 DIAGNOSIS — I4891 Unspecified atrial fibrillation: Secondary | ICD-10-CM | POA: Diagnosis not present

## 2020-05-15 DIAGNOSIS — I10 Essential (primary) hypertension: Secondary | ICD-10-CM | POA: Diagnosis not present

## 2020-05-15 NOTE — Patient Instructions (Addendum)
Medication Instructions:  Your physician recommends that you continue on your current medications as directed. Please refer to the Current Medication list given to you today.  *If you need a refill on your cardiac medications before your next appointment, please call your pharmacy*  Lab Work: None ordered.  If you have labs (blood work) drawn today and your tests are completely normal, you will receive your results only by: . MyChart Message (if you have MyChart) OR . A paper copy in the mail If you have any lab test that is abnormal or we need to change your treatment, we will call you to review the results.  Testing/Procedures: Your physician has recommended that you have an ablation. Catheter ablation is a medical procedure used to treat some cardiac arrhythmias (irregular heartbeats). During catheter ablation, a long, thin, flexible tube is put into a blood vessel in your groin (upper thigh), or neck. This tube is called an ablation catheter. It is then guided to your heart through the blood vessel. Radio frequency waves destroy small areas of heart tissue where abnormal heartbeats may cause an arrhythmia to start. Please see the instruction sheet given to you today.   Follow-Up: At [facility], you and your health needs are our priority.  As part of our continuing mission to provide you with exceptional heart care, we have created designated Provider Care Teams.  These Care Teams include your primary Cardiologist (physician) and Advanced Practice Providers (APPs -  Physician Assistants and Nurse Practitioners) who all work together to provide you with the care you need, when you need it.  We recommend signing up for the patient portal called "MyChart".  Sign up information is provided on this After Visit Summary.  MyChart is used to connect with patients for Virtual Visits (Telemedicine).  Patients are able to view lab/test results, encounter notes, upcoming appointments, etc.  Non-urgent  messages can be sent to your provider as well.   To learn more about what you can do with MyChart, go to [email or web address].      Other Instructions:  Cardiac Ablation Cardiac ablation is a procedure to disable (ablate) a small amount of heart tissue in very specific places. The heart has many electrical connections. Sometimes these connections are abnormal and can cause the heart to beat very fast or irregularly. Ablating some of the problem areas can improve the heart rhythm or return it to normal. Ablation may be done for people who:  Have Wolff-Parkinson-White syndrome.  Have fast heart rhythms (tachycardia).  Have taken medicines for an abnormal heart rhythm (arrhythmia) that were not effective or caused side effects.  Have a high-risk heartbeat that may be life-threatening. During the procedure, a small incision is made in the neck or the groin, and a long, thin, flexible tube (catheter) is inserted into the incision and moved to the heart. Small devices (electrodes) on the tip of the catheter will send out electrical currents. A type of X-ray (fluoroscopy) will be used to help guide the catheter and to provide images of the heart. Tell a health care provider about:  Any allergies you have.  All medicines you are taking, including vitamins, herbs, eye drops, creams, and over-the-counter medicines.  Any problems you or family members have had with anesthetic medicines.  Any blood disorders you have.  Any surgeries you have had.  Any medical conditions you have, such as kidney failure.  Whether you are pregnant or may be pregnant. What are the risks? Generally, this is a safe   procedure. However, problems may occur, including:  Infection.  Bruising and bleeding at the catheter insertion site.  Bleeding into the chest, especially into the sac that surrounds the heart. This is a serious complication.  Stroke or blood clots.  Damage to other structures or  organs.  Allergic reaction to medicines or dyes.  Need for a permanent pacemaker if the normal electrical system is damaged. A pacemaker is a small computer that sends electrical signals to the heart and helps your heart beat normally.  The procedure not being fully effective. This may not be recognized until months later. Repeat ablation procedures are sometimes required. What happens before the procedure?  Follow instructions from your health care provider about eating or drinking restrictions.  Ask your health care provider about: ? Changing or stopping your regular medicines. This is especially important if you are taking diabetes medicines or blood thinners. ? Taking medicines such as aspirin and ibuprofen. These medicines can thin your blood. Do not take these medicines before your procedure if your health care provider instructs you not to.  Plan to have someone take you home from the hospital or clinic.  If you will be going home right after the procedure, plan to have someone with you for 24 hours. What happens during the procedure?  To lower your risk of infection: ? Your health care team will wash or sanitize their hands. ? Your skin will be washed with soap. ? Hair may be removed from the incision area.  An IV tube will be inserted into one of your veins.  You will be given a medicine to help you relax (sedative).  The skin on your neck or groin will be numbed.  An incision will be made in your neck or your groin.  A needle will be inserted through the incision and into a large vein in your neck or groin.  A catheter will be inserted into the needle and moved to your heart.  Dye may be injected through the catheter to help your surgeon see the area of the heart that needs treatment.  Electrical currents will be sent from the catheter to ablate heart tissue in desired areas. There are three types of energy that may be used to ablate heart tissue: ? Heat  (radiofrequency energy). ? Laser energy. ? Extreme cold (cryoablation).  When the necessary tissue has been ablated, the catheter will be removed.  Pressure will be held on the catheter insertion area to prevent excessive bleeding.  A bandage (dressing) will be placed over the catheter insertion area. The procedure may vary among health care providers and hospitals. What happens after the procedure?  Your blood pressure, heart rate, breathing rate, and blood oxygen level will be monitored until the medicines you were given have worn off.  Your catheter insertion area will be monitored for bleeding. You will need to lie still for a few hours to ensure that you do not bleed from the catheter insertion area.  Do not drive for 24 hours or as long as directed by your health care provider. Summary  Cardiac ablation is a procedure to disable (ablate) a small amount of heart tissue in very specific places. Ablating some of the problem areas can improve the heart rhythm or return it to normal.  During the procedure, electrical currents will be sent from the catheter to ablate heart tissue in desired areas. This information is not intended to replace advice given to you by your health care provider. Make sure   you discuss any questions you have with your health care provider. Document Revised: 01/08/2018 Document Reviewed: 06/06/2016 Elsevier Patient Education  2020 [employer]  

## 2020-05-15 NOTE — Telephone Encounter (Signed)
AT/AF recording threshold reprogrammed to >= 60 minutes.

## 2020-05-15 NOTE — Telephone Encounter (Signed)
Longest episode is 3 hours 44 minutes.   Device needs to be Reprogrammed to >= 60 minutes

## 2020-05-15 NOTE — Progress Notes (Signed)
PCP: Antony Contras, MD Primary Cardiologist: Dr Meda Coffee Primary EP: Dr Thompson Grayer Timothy Dickson is a 74 y.o. male who presents today for routine electrophysiology followup.  Since last being seen in our clinic, the patient reports doing very well.  He continues to have episodes of afib.  + palpitations.  No triggers identified.  Today, he denies symptoms of chest pain, shortness of breath,  lower extremity edema, dizziness, presyncope, or syncope.  The patient is otherwise without complaint today.   Past Medical History:  Diagnosis Date  . Atrial tachycardia ([place])    documented by holter 2016. Followed by Dr. Liane Comber and Dr. Rayann Heman.  . 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 no further tx other than surgery [facility].  [name] Cirrhosis ([place])    [facility] dx, early stage- follow up visits at [facility].  [name] CRI (chronic renal insufficiency)    s/p R nephrectomy in 1999 for malignancy  . Diabetes mellitus without complication (HCC)    NIDDM- dx. 3-4 years ago diet control  . Diverticulosis of colon    mild  . GERD (gastroesophageal reflux disease)   . Headache(784.0)   . Hepatitis C    s/p therapy [facility] (curative)/with Harvoni  . History of BPH    no recent issues  . History of substance abuse ([place])    past history -none in 38 yrs  . Hyperglycemia   . Hypertension   . Osteoarthritis, knee    knees bilaterally   . Pneumonia 1975   hx of 35 years ago   Past Surgical History:  Procedure Laterality Date  . Arthroscopy right knee     may '11 (Dr Alvan Dame)  . Arthroscopy, left knee,    . ATRIAL FIBRILLATION ABLATION N/A 10/11/2018   Procedure: ATRIAL FIBRILLATION ABLATION;  Surgeon: Thompson Grayer, MD;  Location: [place] CV LAB;  Service: Cardiovascular;  Laterality: N/A;  . history of liver biopsy  2007  . History of Nephrectomy Right 1999  . implantable loop recorder placement  03/19/2020    Medtronic Reveal Linq model LNQ 22 (RLB [id number] ) implantable loop recorder implanted by Dr Rayann Heman for  evaluation of palpitations and afib management post ablation  . KNEE ARTHROSCOPY Left 10/10/2012   Procedure: LEFT KNEE ARTHROSCOPY WITH MENSCIAL DEBRIDEMENT AND CHONDROPLASTY;  Surgeon: Gearlean Alf, MD;  [facility]: [facility];  Service: Orthopedics;  Laterality: Left;  . left shoulder repair for bone spur  1990's  . TEE WITHOUT CARDIOVERSION N/A 10/11/2018   Procedure: TRANSESOPHAGEAL ECHOCARDIOGRAM (TEE);  Surgeon: Buford Dresser, MD;  Location: [facility] ENDOSCOPY;  Service: Cardiovascular;  Laterality: N/A;  ablation to follow at 1030  . TOTAL KNEE ARTHROPLASTY Right 08/15/2016   Procedure: RIGHT TOTAL KNEE ARTHROPLASTY, CORTISONE INJECTION OF LEFT KNEE;  Surgeon: Gaynelle Arabian, MD;  [facility];  Service: Orthopedics;  Laterality: Right;  . TOTAL KNEE ARTHROPLASTY Left 09/04/2017   Procedure: LEFT TOTAL KNEE ARTHROPLASTY;  Surgeon: Gaynelle Arabian, MD;  [facility];  Service: Orthopedics;  Laterality: Left;  . Transurethral needle ablation procedure    . TUNA procedure      ROS- all systems are reviewed and negatives except as per HPI above  Current Outpatient Medications  Medication Sig Dispense Refill  . acetaminophen (TYLENOL) 650 MG CR tablet Take 650 mg by mouth at bedtime.     [name] allopurinol (ZYLOPRIM) 100 MG tablet Take 100 mg by mouth every evening.     . Ascorbic Acid (VITAMIN C) 1000 MG tablet  Take 1,000 mg by mouth daily.    . carvedilol (COREG) 12.5 MG tablet TAKE 1 TABLET BY MOUTH TWICE DAILY WITH A MEAL. 180 tablet 3  . ELIQUIS 5 MG TABS tablet TAKE 1 TABLET BY MOUTH TWICE DAILY. 60 tablet 6  . famotidine (PEPCID) 20 MG tablet Take 20 mg by mouth. Taking one tablet once a week    . flecainide (TAMBOCOR) 100 MG tablet Take 1 tablet (100 mg total) by mouth 2 (two) times daily. 60 tablet 6  . gabapentin (NEURONTIN) 600 MG tablet Take 1,200 mg by mouth at bedtime.     . hydrochlorothiazide (HYDRODIURIL) 25 MG tablet Take 1 tablet (25 mg total) by mouth daily. 90 tablet 2    . Multiple Vitamin (MULTIVITAMIN) capsule Take 1 capsule by mouth daily.    . Multiple Vitamins-Minerals (IMMUNE SUPPORT PO) Take by mouth daily. Taking two tablets daily- Vitamin D, echinacea, zinc and elderberry    . omeprazole (PRILOSEC) 20 MG capsule Take 20 mg by mouth 3 (three) times a week.    [name] REPATHA SURECLICK 537 MG/ML SOAJ INJECT CONTENTS OF 1 PEN SUBCUTANEOUSLY EVERY 14 DAYS. 2 mL 11   No current facility-administered medications for this visit.    Physical Exam: Vitals:   05/15/20 1528  BP: 98/60  Pulse: (!) 57  SpO2: 95%  Weight: 216 lb 12.8 oz (98.3 kg)  Height: 6' (1.829 m)    GEN- The patient is well appearing, alert and oriented x 3 today.   Head- normocephalic, atraumatic Eyes-  Sclera clear, conjunctiva pink Ears- hearing intact Oropharynx- clear Lungs-  normal work of breathing Heart- Regular rate and rhythm  GI- soft  Extremities- no clubbing, cyanosis, or edema  Wt Readings from Last 3 Encounters:  05/15/20 216 lb 12.8 oz (98.3 kg)  04/20/20 218 lb (98.9 kg)  04/07/20 212 lb 12.8 oz (96.5 kg)    EKG tracing ordered today is personally reviewed and shows sinus  Assessment and Plan:  1. Paroxysmal atrial fibrillation chads2vasc score is 4.  He is on eliquis He has done well with flecainide. ILR is reviewed today and shows episodes of afib. The patient has symptomatic, recurrent paroxysmal atrial fibrillation. he has failed medical therapy with flecainide. Chads2vasc score is 4.  he is anticoagulated with eliquis . Therapeutic strategies for afib including medicine and ablation were discussed in detail with the patient today. Risk, benefits, and alternatives to EP study and radiofrequency ablation for afib were also discussed in detail today. These risks include but are not limited to stroke, bleeding, vascular damage, tamponade, perforation, damage to the esophagus, lungs, and other structures, pulmonary vein stenosis, worsening renal function, and  death. The patient understands these risk and wishes to proceed.  We will therefore proceed with catheter ablation at the next available time.  Carto, ICE, anesthesia are requested for the procedure.  Will also obtain cardiac CT prior to the procedure to exclude LAA thrombus and further evaluate atrial anatomy.   2. HTN Stable No change required today  3. HL Stable No change required today    Risks, benefits and potential toxicities for medications prescribed and/or refilled reviewed with patient today.   Thompson Grayer MD, [facility] 05/15/2020 3:39 PM

## 2020-05-18 MED ORDER — CARVEDILOL 6.25 MG PO TABS: 6.2500 mg | ORAL_TABLET | Freq: Two times a day (BID) | ORAL | 3 refills | Status: DC

## 2020-05-18 NOTE — Telephone Encounter (Signed)
Pt has a lnq2 that records nightly.  It was implanted for AF with Loletha Grayer detection is off.  Therefore we can only see the lower Heart rates in the histograms.    His presenting Rhythm at midnight last night was in the 50s   The trends do show some recent lower heart rates.

## 2020-05-21 DIAGNOSIS — I480 Paroxysmal atrial fibrillation: Secondary | ICD-10-CM | POA: Diagnosis not present

## 2020-05-21 DIAGNOSIS — E7800 Pure hypercholesterolemia, unspecified: Secondary | ICD-10-CM | POA: Diagnosis not present

## 2020-05-21 DIAGNOSIS — M1711 Unilateral primary osteoarthritis, right knee: Secondary | ICD-10-CM | POA: Diagnosis not present

## 2020-05-21 DIAGNOSIS — E119 Type 2 diabetes mellitus without complications: Secondary | ICD-10-CM | POA: Diagnosis not present

## 2020-05-24 ENCOUNTER — Other Ambulatory Visit: Payer: Self-pay | Admitting: Cardiology

## 2020-05-25 ENCOUNTER — Telehealth: Payer: Self-pay

## 2020-05-25 ENCOUNTER — Ambulatory Visit: Payer: Medicare Other | Admitting: Internal Medicine

## 2020-05-25 ENCOUNTER — Ambulatory Visit (INDEPENDENT_AMBULATORY_CARE_PROVIDER_SITE_OTHER): Payer: Medicare Other

## 2020-05-25 DIAGNOSIS — I480 Paroxysmal atrial fibrillation: Secondary | ICD-10-CM | POA: Diagnosis not present

## 2020-05-25 NOTE — Telephone Encounter (Signed)
Spoke with pt, provided him with phoe number for Med[employer] technical suppport # for assistance with their APP.

## 2020-05-25 NOTE — Telephone Encounter (Signed)
-----   Message from Juluis Mire, RN sent at 05/25/2020  1:19 PM EDT ----- Regarding: linq transmission Pt left a message on our machine stating he is having issues with his linq app/transmitting. Please call pt at [phone] for assistance with this. Thanks [employer]

## 2020-05-26 ENCOUNTER — Telehealth: Payer: Self-pay

## 2020-05-26 LAB — CUP PACEART REMOTE DEVICE CHECK
Date Time Interrogation Session: 20211024195543
Implantable Pulse Generator Implant Date: 20210819

## 2020-05-26 NOTE — Telephone Encounter (Signed)
LMOVM for pt to return my call to get help with his monitor.

## 2020-05-27 ENCOUNTER — Telehealth: Payer: Medicare Other | Admitting: Internal Medicine

## 2020-05-27 NOTE — Telephone Encounter (Signed)
Pt sent email indicating his app is now working.  Confirmed that transmission received today.

## 2020-05-27 NOTE — Progress Notes (Signed)
Carelink Summary Report / Loop Recorder 

## 2020-06-03 ENCOUNTER — Other Ambulatory Visit: Payer: Medicare Other | Admitting: *Deleted

## 2020-06-03 ENCOUNTER — Other Ambulatory Visit: Payer: Self-pay

## 2020-06-03 DIAGNOSIS — I4891 Unspecified atrial fibrillation: Secondary | ICD-10-CM

## 2020-06-03 DIAGNOSIS — I10 Essential (primary) hypertension: Secondary | ICD-10-CM

## 2020-06-03 DIAGNOSIS — I480 Paroxysmal atrial fibrillation: Secondary | ICD-10-CM

## 2020-06-03 NOTE — Telephone Encounter (Signed)
Called the patient and advised per Dr. Rayann Heman that he could take 100 mg mid day if he had a long episode but no more than that and never two days in a row.   Patient verbalized understanding.

## 2020-06-04 LAB — CBC WITH DIFFERENTIAL/PLATELET
Basophils Absolute: 0.1 10*3/uL (ref 0.0–0.2)
Basos: 1 %
EOS (ABSOLUTE): 0.2 10*3/uL (ref 0.0–0.4)
Eos: 2 %
Hematocrit: 42.4 % (ref 37.5–51.0)
Hemoglobin: 14.9 g/dL (ref 13.0–17.7)
Immature Grans (Abs): 0 10*3/uL (ref 0.0–0.1)
Immature Granulocytes: 1 %
Lymphocytes Absolute: 1.5 10*3/uL (ref 0.7–3.1)
Lymphs: 18 %
MCH: 32.3 pg (ref 26.6–33.0)
MCHC: 35.1 g/dL (ref 31.5–35.7)
MCV: 92 fL (ref 79–97)
Monocytes Absolute: 0.7 10*3/uL (ref 0.1–0.9)
Monocytes: 8 %
Neutrophils Absolute: 5.9 10*3/uL (ref 1.4–7.0)
Neutrophils: 70 %
Platelets: 268 10*3/uL (ref 150–450)
RBC: 4.62 x10E6/uL (ref 4.14–5.80)
RDW: 12 % (ref 11.6–15.4)
WBC: 8.4 10*3/uL (ref 3.4–10.8)

## 2020-06-04 LAB — BASIC METABOLIC PANEL
BUN/Creatinine Ratio: 16 (ref 10–24)
BUN: 20 mg/dL (ref 8–27)
CO2: 26 mmol/L (ref 20–29)
Calcium: 9.5 mg/dL (ref 8.6–10.2)
Chloride: 95 mmol/L — ABNORMAL LOW (ref 96–106)
Creatinine, Ser: 1.25 mg/dL (ref 0.76–1.27)
GFR calc Af Amer: 65 mL/min/{1.73_m2} (ref >59)
GFR calc non Af Amer: 56 mL/min/{1.73_m2} — ABNORMAL LOW (ref >59)
Glucose: 122 mg/dL — ABNORMAL HIGH (ref 65–99)
Potassium: 4.4 mmol/L (ref 3.5–5.2)
Sodium: 135 mmol/L (ref 134–144)

## 2020-06-12 ENCOUNTER — Telehealth (HOSPITAL_COMMUNITY): Payer: Self-pay | Admitting: *Deleted

## 2020-06-12 NOTE — Telephone Encounter (Signed)
Attempted to call patient regarding upcoming cardiac CT appointment. Left message on voicemail with name and callback number  Andre Gallego Tai RN Navigator Cardiac Imaging [name] [phone] Office [phone] Cell  

## 2020-06-15 ENCOUNTER — Ambulatory Visit (HOSPITAL_COMMUNITY)
Admission: RE | Admit: 2020-06-15 | Discharge: 2020-06-15 | Disposition: A | Discharge status: Home / Self Care | Payer: Medicare Other | Source: Ambulatory Visit | Attending: Internal Medicine | Admitting: Internal Medicine

## 2020-06-15 ENCOUNTER — Other Ambulatory Visit: Payer: Self-pay

## 2020-06-15 DIAGNOSIS — I4891 Unspecified atrial fibrillation: Secondary | ICD-10-CM

## 2020-06-15 MED ORDER — IOHEXOL 350 MG/ML SOLN
80.0000 mL | Freq: Once | INTRAVENOUS | Status: AC | PRN
  Administered 2020-06-15: 70 mL via INTRAVENOUS

## 2020-06-17 ENCOUNTER — Other Ambulatory Visit (HOSPITAL_COMMUNITY)
Admission: RE | Admit: 2020-06-17 | Discharge: 2020-06-17 | Disposition: A | Discharge status: Home / Self Care | Payer: Medicare Other | Source: Ambulatory Visit | Attending: Internal Medicine | Admitting: Internal Medicine

## 2020-06-17 ENCOUNTER — Encounter: Payer: Medicare Other | Admitting: Internal Medicine

## 2020-06-17 DIAGNOSIS — Z20822 Contact with and (suspected) exposure to covid-19: Secondary | ICD-10-CM | POA: Diagnosis not present

## 2020-06-17 DIAGNOSIS — Z01812 Encounter for preprocedural laboratory examination: Secondary | ICD-10-CM | POA: Diagnosis not present

## 2020-06-17 LAB — SARS CORONAVIRUS 2 (TAT 6-24 HRS): SARS Coronavirus 2: NEGATIVE

## 2020-06-18 NOTE — Anesthesia Preprocedure Evaluation (Addendum)
Anesthesia Evaluation  Patient identified by MRN, date of birth, ID band Patient awake    Reviewed: Allergy & Precautions, NPO status , Patient's Chart, lab work & pertinent test results  History of Anesthesia Complications Negative for: history of anesthetic complications  Airway Mallampati: II  TM Distance: >3 FB Neck ROM: Full    Dental  (+) Partial Upper, Dental Advisory Given   Pulmonary former smoker,    Pulmonary exam normal        Cardiovascular hypertension, Pt. on medications and Pt. on home beta blockers Normal cardiovascular exam+ dysrhythmias Atrial Fibrillation      Neuro/Psych negative neurological ROS  negative psych ROS   GI/Hepatic negative GI ROS, (+) Hepatitis -, CHep C treated with Harvoni   Endo/Other  diabetes, Type 2  Renal/GU Renal diseaseRCC S/P right nephrectomy  negative genitourinary   Musculoskeletal negative musculoskeletal ROS (+)   Abdominal   Peds negative pediatric ROS (+)  Hematology negative hematology ROS (+)   Anesthesia Other Findings   Reproductive/Obstetrics negative OB ROS                            Anesthesia Physical  Anesthesia Plan  ASA: III  Anesthesia Plan: General   Post-op Pain Management:    Induction: Intravenous  PONV Risk Score and Plan: 2 and Ondansetron, Treatment may vary due to age or medical condition and Dexamethasone  Airway Management Planned: Oral ETT  Additional Equipment:   Intra-op Plan:   Post-operative Plan: Extubation in OR  Informed Consent: I have reviewed the patients History and Physical, chart, labs and discussed the procedure including the risks, benefits and alternatives for the proposed anesthesia with the patient or authorized representative who has indicated his/her understanding and acceptance.     Dental advisory given  Plan Discussed with: Anesthesiologist and CRNA  Anesthesia Plan  Comments:        Anesthesia Quick Evaluation

## 2020-06-18 NOTE — Progress Notes (Signed)
Attempted to call patient regarding procedure instructions.  Left voice mail of the following items.  Instructed patient on the following items: Arrival time 0530 Nothing to eat or drink after midnight No meds AM of procedure Responsible person to drive you home and stay with you for 24 hr  On eliquis- left message to take both doses today

## 2020-06-19 ENCOUNTER — Encounter (HOSPITAL_COMMUNITY)
Admission: RE | Disposition: A | Discharge status: Home / Self Care | Payer: Self-pay | Source: Home / Self Care | Attending: Internal Medicine

## 2020-06-19 ENCOUNTER — Other Ambulatory Visit: Payer: Self-pay

## 2020-06-19 ENCOUNTER — Ambulatory Visit (HOSPITAL_COMMUNITY)
Admission: RE | Admit: 2020-06-19 | Discharge: 2020-06-19 | Disposition: A | Discharge status: Home / Self Care | Payer: Medicare Other | Attending: Internal Medicine | Admitting: Internal Medicine

## 2020-06-19 ENCOUNTER — Ambulatory Visit (HOSPITAL_COMMUNITY): Payer: Medicare Other | Admitting: Certified Registered"

## 2020-06-19 DIAGNOSIS — Z7901 Long term (current) use of anticoagulants: Secondary | ICD-10-CM | POA: Insufficient documentation

## 2020-06-19 DIAGNOSIS — I471 Supraventricular tachycardia: Secondary | ICD-10-CM | POA: Diagnosis not present

## 2020-06-19 DIAGNOSIS — Z79899 Other long term (current) drug therapy: Secondary | ICD-10-CM | POA: Insufficient documentation

## 2020-06-19 DIAGNOSIS — I351 Nonrheumatic aortic (valve) insufficiency: Secondary | ICD-10-CM | POA: Diagnosis not present

## 2020-06-19 DIAGNOSIS — I480 Paroxysmal atrial fibrillation: Secondary | ICD-10-CM | POA: Insufficient documentation

## 2020-06-19 DIAGNOSIS — I4892 Unspecified atrial flutter: Secondary | ICD-10-CM | POA: Diagnosis not present

## 2020-06-19 DIAGNOSIS — E7849 Other hyperlipidemia: Secondary | ICD-10-CM | POA: Diagnosis not present

## 2020-06-19 DIAGNOSIS — I4819 Other persistent atrial fibrillation: Secondary | ICD-10-CM | POA: Diagnosis not present

## 2020-06-19 HISTORY — PX: ATRIAL FIBRILLATION ABLATION: EP1191

## 2020-06-19 SURGERY — ATRIAL FIBRILLATION ABLATION
Anesthesia: General

## 2020-06-19 MED ORDER — PROPOFOL 10 MG/ML IV BOLUS
INTRAVENOUS | Status: DC | PRN
  Administered 2020-06-19: 200 mg via INTRAVENOUS

## 2020-06-19 MED ORDER — FENTANYL CITRATE (PF) 250 MCG/5ML IJ SOLN
INTRAMUSCULAR | Status: DC | PRN
  Administered 2020-06-19: 50 ug via INTRAVENOUS

## 2020-06-19 MED ORDER — ISOPROTERENOL HCL 0.2 MG/ML IJ SOLN
INTRAVENOUS | Status: DC | PRN
  Administered 2020-06-19: 20 ug/min via INTRAVENOUS

## 2020-06-19 MED ORDER — PHENYLEPHRINE HCL-NACL 10-0.9 MG/250ML-% IV SOLN
INTRAVENOUS | Status: DC | PRN
  Administered 2020-06-19: 30 ug/min via INTRAVENOUS

## 2020-06-19 MED ORDER — PROTAMINE SULFATE 10 MG/ML IV SOLN
INTRAVENOUS | Status: DC | PRN
  Administered 2020-06-19: 30 mg via INTRAVENOUS
  Administered 2020-06-19: 10 mg via INTRAVENOUS

## 2020-06-19 MED ORDER — SODIUM CHLORIDE 0.9 % IV SOLN: INTRAVENOUS | Status: DC

## 2020-06-19 MED ORDER — HEPARIN SODIUM (PORCINE) 1000 UNIT/ML IJ SOLN
INTRAMUSCULAR | Status: DC | PRN
  Administered 2020-06-19: 2000 [IU] via INTRAVENOUS
  Administered 2020-06-19: 5000 [IU] via INTRAVENOUS

## 2020-06-19 MED ORDER — HEPARIN (PORCINE) IN NACL 1000-0.9 UT/500ML-% IV SOLN
INTRAVENOUS | Status: DC | PRN
  Administered 2020-06-19: 500 mL

## 2020-06-19 MED ORDER — HYDROCODONE-ACETAMINOPHEN 5-325 MG PO TABS
1.0000 | ORAL_TABLET | ORAL | Status: DC | PRN
  Administered 2020-06-19 (×2): 1 via ORAL
  Filled 2020-06-19: qty 2
  Filled 2020-06-19: qty 1
  Filled 2020-06-19: qty 2

## 2020-06-19 MED ORDER — ADENOSINE 6 MG/2ML IV SOLN
INTRAVENOUS | Status: AC
  Filled 2020-06-19: qty 8

## 2020-06-19 MED ORDER — SODIUM CHLORIDE 0.9% FLUSH: 3.0000 mL | INTRAVENOUS | Status: DC | PRN

## 2020-06-19 MED ORDER — HEPARIN SODIUM (PORCINE) 1000 UNIT/ML IJ SOLN
INTRAMUSCULAR | Status: DC | PRN
  Administered 2020-06-19: 14000 [IU] via INTRAVENOUS
  Administered 2020-06-19: 1000 [IU] via INTRAVENOUS

## 2020-06-19 MED ORDER — PHENYLEPHRINE 40 MCG/ML (10ML) SYRINGE FOR IV PUSH (FOR BLOOD PRESSURE SUPPORT)
PREFILLED_SYRINGE | INTRAVENOUS | Status: DC | PRN
  Administered 2020-06-19: 120 ug via INTRAVENOUS

## 2020-06-19 MED ORDER — HYDROCODONE-ACETAMINOPHEN 5-325 MG PO TABS
ORAL_TABLET | ORAL | Status: AC
  Filled 2020-06-19: qty 1

## 2020-06-19 MED ORDER — ROCURONIUM BROMIDE 10 MG/ML (PF) SYRINGE
PREFILLED_SYRINGE | INTRAVENOUS | Status: DC | PRN
  Administered 2020-06-19: 90 mg via INTRAVENOUS

## 2020-06-19 MED ORDER — SUGAMMADEX SODIUM 200 MG/2ML IV SOLN
INTRAVENOUS | Status: DC | PRN
  Administered 2020-06-19: 200 mg via INTRAVENOUS

## 2020-06-19 MED ORDER — ISOPROTERENOL HCL 0.2 MG/ML IJ SOLN
INTRAMUSCULAR | Status: AC
  Filled 2020-06-19: qty 5

## 2020-06-19 MED ORDER — ONDANSETRON HCL 4 MG/2ML IJ SOLN
4.0000 mg | Freq: Four times a day (QID) | INTRAMUSCULAR | Status: DC | PRN
  Administered 2020-06-19: 4 mg via INTRAVENOUS

## 2020-06-19 MED ORDER — LIDOCAINE 2% (20 MG/ML) 5 ML SYRINGE
INTRAMUSCULAR | Status: DC | PRN
  Administered 2020-06-19: 100 mg via INTRAVENOUS

## 2020-06-19 MED ORDER — ADENOSINE 6 MG/2ML IV SOLN
INTRAVENOUS | Status: DC | PRN
  Administered 2020-06-19: 12 mg via INTRAVENOUS

## 2020-06-19 MED ORDER — APIXABAN 5 MG PO TABS
5.0000 mg | ORAL_TABLET | Freq: Once | ORAL | Status: AC
  Administered 2020-06-19: 5 mg via ORAL
  Filled 2020-06-19: qty 1

## 2020-06-19 MED ORDER — HEPARIN (PORCINE) IN NACL 1000-0.9 UT/500ML-% IV SOLN
INTRAVENOUS | Status: AC
  Filled 2020-06-19: qty 500

## 2020-06-19 MED ORDER — DEXAMETHASONE SODIUM PHOSPHATE 10 MG/ML IJ SOLN
INTRAMUSCULAR | Status: DC | PRN
  Administered 2020-06-19: 10 mg via INTRAVENOUS

## 2020-06-19 MED ORDER — SODIUM CHLORIDE 0.9 % IV SOLN: 250.0000 mL | INTRAVENOUS | Status: DC | PRN

## 2020-06-19 MED ORDER — FENTANYL CITRATE (PF) 100 MCG/2ML IJ SOLN: 25.0000 ug | INTRAMUSCULAR | Status: DC | PRN

## 2020-06-19 MED ORDER — HEPARIN SODIUM (PORCINE) 1000 UNIT/ML IJ SOLN
INTRAMUSCULAR | Status: AC
  Filled 2020-06-19: qty 2

## 2020-06-19 MED ORDER — OXYCODONE-ACETAMINOPHEN 5-325 MG PO TABS
ORAL_TABLET | ORAL | Status: AC
  Filled 2020-06-19: qty 1

## 2020-06-19 MED ORDER — ONDANSETRON HCL 4 MG/2ML IJ SOLN
INTRAMUSCULAR | Status: DC | PRN
  Administered 2020-06-19: 4 mg via INTRAVENOUS

## 2020-06-19 MED ORDER — ONDANSETRON HCL 4 MG/2ML IJ SOLN
INTRAMUSCULAR | Status: AC
  Filled 2020-06-19: qty 2

## 2020-06-19 MED ORDER — SODIUM CHLORIDE 0.9% FLUSH: 3.0000 mL | Freq: Two times a day (BID) | INTRAVENOUS | Status: DC

## 2020-06-19 SURGICAL SUPPLY — 18 items
BLANKET WARM UNDERBOD FULL ACC (MISCELLANEOUS) ×2 IMPLANT
CATH 8FR REPROCESSED SOUNDSTAR (CATHETERS) ×2 IMPLANT
CATH MAPPNG PENTARAY F 2-6-2MM (CATHETERS) ×1 IMPLANT
CATH SMTCH THERMOCOOL SF DF (CATHETERS) ×2 IMPLANT
CATH WEBSTER BI DIR CS D-F CRV (CATHETERS) ×2 IMPLANT
CLOSURE PERCLOSE PROSTYLE (VASCULAR PRODUCTS) ×6 IMPLANT
COVER DOME SNAP 22 D (MISCELLANEOUS) ×2 IMPLANT
COVER SWIFTLINK CONNECTOR (BAG) ×2 IMPLANT
NEEDLE BAYLIS TRANSSEPTAL 71CM (NEEDLE) ×2 IMPLANT
PACK EP LATEX FREE (CUSTOM PROCEDURE TRAY) ×2
PACK EP LF (CUSTOM PROCEDURE TRAY) ×1 IMPLANT
PAD PRO RADIOLUCENT 2001M-C (PAD) ×2 IMPLANT
PENTARAY F 2-6-2MM (CATHETERS) ×2
SHEATH PINNACLE 7F 10CM (SHEATH) ×4 IMPLANT
SHEATH PINNACLE 9F 10CM (SHEATH) ×2 IMPLANT
SHEATH PROBE COVER 6X72 (BAG) ×2 IMPLANT
SHEATH SWARTZ TS SL2 63CM 8.5F (SHEATH) ×2 IMPLANT
TUBING SMART ABLATE COOLFLOW (TUBING) ×2 IMPLANT

## 2020-06-19 NOTE — Anesthesia Postprocedure Evaluation (Signed)
Anesthesia Post Note  Patient: Timothy Dickson  Procedure(s) Performed: ATRIAL FIBRILLATION ABLATION (N/A )     Patient location during evaluation: PACU Anesthesia Type: General Level of consciousness: sedated Pain management: pain level controlled Vital Signs Assessment: post-procedure vital signs reviewed and stable Respiratory status: spontaneous breathing and respiratory function stable Cardiovascular status: stable Postop Assessment: no apparent nausea or vomiting Anesthetic complications: no   No complications documented.  Last Vitals:  Vitals:   06/19/20 1316 06/19/20 1346  BP: 124/79 121/75  Pulse: 64 62  Resp: 13 10  Temp:    SpO2: 96% 95%    Last Pain:  Vitals:   06/19/20 1305  TempSrc:   PainSc: 2                  Indea Dearman,Givanni DANIEL

## 2020-06-19 NOTE — Transfer of Care (Signed)
Immediate Anesthesia Transfer of Care Note  Patient: Timothy Dickson  Procedure(s) Performed: ATRIAL FIBRILLATION ABLATION (N/A )  Patient Location: PACU and Cath Lab  Anesthesia Type:General  Level of Consciousness: awake, alert  and oriented  Airway & Oxygen Therapy: Patient Spontanous Breathing  Post-op Assessment: Report given to RN and Post -op Vital signs reviewed and stable  Post vital signs: Reviewed and stable  Last Vitals:  Vitals Value Taken Time  BP 101/61 06/19/20 1018  Temp    Pulse 56 06/19/20 1020  Resp 18 06/19/20 1020  SpO2 94 % 06/19/20 1020  Vitals shown include unvalidated device data.  Last Pain:  Vitals:   06/19/20 1017  TempSrc:   PainSc: 0-No pain         Complications: No complications documented.

## 2020-06-19 NOTE — H&P (Signed)
Timothy Dickson is a 74 y.o. male who presents today for routine electrophysiology study and ablation for afib.  Since last being seen in our clinic, the patient reports doing very well.  He continues to have episodes of afib.  + palpitations.  No triggers identified.  Today, he denies symptoms of chest pain, shortness of breath,  lower extremity edema, dizziness, presyncope, or syncope.  The patient is otherwise without complaint today.       Past Medical History:  Diagnosis Date  . Atrial tachycardia ([place])    documented by holter 2016. Followed by Dr. Liane Comber and Dr. Rayann Heman.  . 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 no further tx other than surgery [facility].  [name] Cirrhosis ([name])    [facility] dx, early stage- follow up visits at [facility].  [name] CRI (chronic renal insufficiency)    s/p R nephrectomy in 1999 for malignancy  . Diabetes mellitus without complication (HCC)    NIDDM- dx. 3-4 years ago diet control  . Diverticulosis of colon    mild  . GERD (gastroesophageal reflux disease)   . Headache(784.0)   . Hepatitis C    s/p therapy [facility] (curative)/with Harvoni  . History of BPH    no recent issues  . History of substance abuse ([place])    past history -none in 38 yrs  . Hyperglycemia   . Hypertension   . Osteoarthritis, knee    knees bilaterally   . Pneumonia 1975   hx of 35 years ago        Past Surgical History:  Procedure Laterality Date  . Arthroscopy right knee     may '11 (Dr Alvan Dame)  . Arthroscopy, left knee,    . ATRIAL FIBRILLATION ABLATION N/A 10/11/2018   Procedure: ATRIAL FIBRILLATION ABLATION;  Surgeon: Thompson Grayer, MD;  Location: [place] CV LAB;  Service: Cardiovascular;  Laterality: N/A;  . history of liver biopsy  2007  . History of Nephrectomy Right 1999  . implantable loop recorder placement  03/19/2020    Medtronic Reveal Linq model LNQ 22 (RLB [id number] ) implantable loop recorder implanted by Dr Rayann Heman for evaluation of  palpitations and afib management post ablation  . KNEE ARTHROSCOPY Left 10/10/2012   Procedure: LEFT KNEE ARTHROSCOPY WITH MENSCIAL DEBRIDEMENT AND CHONDROPLASTY;  Surgeon: Gearlean Alf, MD;  [facility];  Service: Orthopedics;  Laterality: Left;  . left shoulder repair for bone spur  1990's  . TEE WITHOUT CARDIOVERSION N/A 10/11/2018   Procedure: TRANSESOPHAGEAL ECHOCARDIOGRAM (TEE);  Surgeon: Buford Dresser, MD;  Location: [facility] ENDOSCOPY;  Service: Cardiovascular;  Laterality: N/A;  ablation to follow at 1030  . TOTAL KNEE ARTHROPLASTY Right 08/15/2016   Procedure: RIGHT TOTAL KNEE ARTHROPLASTY, CORTISONE INJECTION OF LEFT KNEE;  Surgeon: Gaynelle Arabian, MD;  [facility];  Service: Orthopedics;  Laterality: Right;  . TOTAL KNEE ARTHROPLASTY Left 09/04/2017   Procedure: LEFT TOTAL KNEE ARTHROPLASTY;  Surgeon: Gaynelle Arabian, MD;  [facility];  Service: Orthopedics;  Laterality: Left;  . Transurethral needle ablation procedure    . TUNA procedure      ROS- all systems are reviewed and negatives except as per HPI above        Current Outpatient Medications  Medication Sig Dispense Refill  . acetaminophen (TYLENOL) 650 MG CR tablet Take 650 mg by mouth at bedtime.     [name] allopurinol (ZYLOPRIM) 100 MG tablet Take 100 mg by mouth every evening.     . Ascorbic Acid (  VITAMIN C) 1000 MG tablet Take 1,000 mg by mouth daily.    . carvedilol (COREG) 12.5 MG tablet TAKE 1 TABLET BY MOUTH TWICE DAILY WITH A MEAL. 180 tablet 3  . ELIQUIS 5 MG TABS tablet TAKE 1 TABLET BY MOUTH TWICE DAILY. 60 tablet 6  . famotidine (PEPCID) 20 MG tablet Take 20 mg by mouth. Taking one tablet once a week    . flecainide (TAMBOCOR) 100 MG tablet Take 1 tablet (100 mg total) by mouth 2 (two) times daily. 60 tablet 6  . gabapentin (NEURONTIN) 600 MG tablet Take 1,200 mg by mouth at bedtime.     . hydrochlorothiazide (HYDRODIURIL) 25 MG tablet Take 1 tablet (25 mg total) by mouth  daily. 90 tablet 2  . Multiple Vitamin (MULTIVITAMIN) capsule Take 1 capsule by mouth daily.    . Multiple Vitamins-Minerals (IMMUNE SUPPORT PO) Take by mouth daily. Taking two tablets daily- Vitamin D, echinacea, zinc and elderberry    . omeprazole (PRILOSEC) 20 MG capsule Take 20 mg by mouth 3 (three) times a week.    [name] REPATHA SURECLICK 097 MG/ML SOAJ INJECT CONTENTS OF 1 PEN SUBCUTANEOUSLY EVERY 14 DAYS. 2 mL 11   No current facility-administered medications for this visit.    Physical Exam: Vitals:   06/19/20 0551  BP: 133/79  Pulse: 61  Resp: 14  Temp: 98.4 F (36.9 C)  SpO2: 97%   GEN- The patient is well appearing, alert and oriented x 3 today.   Head- normocephalic, atraumatic Eyes-  Sclera clear, conjunctiva pink Ears- hearing intact Oropharynx- clear Lungs-  normal work of breathing Heart- Regular rate and rhythm  GI- soft  Extremities- no clubbing, cyanosis, or edema     Wt Readings from Last 3 Encounters:  05/15/20 216 lb 12.8 oz (98.3 kg)  04/20/20 218 lb (98.9 kg)  04/07/20 212 lb 12.8 oz (96.5 kg)    Assessment and Plan:  1. Paroxysmal atrial fibrillation chads2vasc score is 4.  He is on eliquis He has done well with flecainide. ILR is reviewed today and shows episodes of afib. The patient has symptomatic, recurrent paroxysmal atrial fibrillation. he has failed medical therapy with flecainide. Chads2vasc score is 4.  he is anticoagulated with eliquis .   Cardiac CT reviewed with him at length today.  He reports compliance with eliquis without interruption.  Risk, benefits, and alternatives to EP study and radiofrequency ablation for afib were also discussed in detail today. These risks include but are not limited to stroke, bleeding, vascular damage, tamponade, perforation, damage to the esophagus, lungs, and other structures, pulmonary vein stenosis, worsening renal function, and death. The patient understands these risk and wishes to  proceed.    Thompson Grayer MD, [place] 06/19/2020 7:13 AM

## 2020-06-19 NOTE — Discharge Instructions (Signed)
Cardiac Ablation, Care After This sheet gives you information about how to care for yourself after your procedure. Your health care provider may also give you more specific instructions. If you have problems or questions, contact your health care provider. What can I expect after the procedure? After the procedure, it is common to have:  Bruising around your puncture site.  Tenderness around your puncture site.  Skipped heartbeats.  Tiredness (fatigue). Follow these instructions at home: Puncture site care   Follow instructions from your health care provider about how to take care of your puncture site. Make sure you: ? Wash your hands with soap and water before you change your bandage (dressing). If soap and water are not available, use hand sanitizer. ? Change your dressing as told by your health care provider. ? Leave stitches (sutures), skin glue, or adhesive strips in place. These skin closures may need to stay in place for up to 2 weeks. If adhesive strip edges start to loosen and curl up, you may trim the loose edges. Do not remove adhesive strips completely unless your health care provider tells you to do that.  Check your puncture site every day for signs of infection. Check for: ? Redness, swelling, or pain. ? Fluid or blood. If your puncture site starts to bleed, lie down on your back, apply firm pressure to the area, and contact your health care provider. ? Warmth. ? Pus or a bad smell. Driving  Ask your health care provider when it is safe for you to drive again after the procedure.  Do not drive or use heavy machinery while taking prescription pain medicine.  Do not drive for 24 hours if you were given a medicine to help you relax (sedative) during your procedure. Activity  Avoid activities that take a lot of effort for at least 3 days after your procedure.  Do not lift anything that is heavier than 10 lb (4.5 kg), or the limit that you are told, until your health  care provider says that it is safe.  Return to your normal activities as told by your health care provider. Ask your health care provider what activities are safe for you. General instructions  Take over-the-counter and prescription medicines only as told by your health care provider.  Do not use any products that contain nicotine or tobacco, such as cigarettes and e-cigarettes. If you need help quitting, ask your health care provider.  Do not take baths, swim, or use a hot tub until your health care provider approves.  Do not drink alcohol for 24 hours after your procedure.  Keep all follow-up visits as told by your health care provider. This is important. Contact a health care provider if:  You have redness, mild swelling, or pain around your puncture site.  You have fluid or blood coming from your puncture site that stops after applying firm pressure to the area.  Your puncture site feels warm to the touch.  You have pus or a bad smell coming from your puncture site.  You have a fever.  You have chest pain or discomfort that spreads to your neck, jaw, or arm.  You are sweating a lot.  You feel nauseous.  You have a fast or irregular heartbeat.  You have shortness of breath.  You are dizzy or light-headed and feel the need to lie down.  You have pain or numbness in the arm or leg closest to your puncture site. Get help right away if:  Your puncture   site suddenly swells.  Your puncture site is bleeding and the bleeding does not stop after applying firm pressure to the area. These symptoms may represent a serious problem that is an emergency. Do not wait to see if the symptoms will go away. Get medical help right away. Call your local emergency services (911 in the [place] Do not drive yourself to the hospital. Summary  After the procedure, it is normal to have bruising and tenderness at the puncture site in your groin, neck, or forearm.  Check your puncture site every  day for signs of infection.  Get help right away if your puncture site is bleeding and the bleeding does not stop after applying firm pressure to the area. This is a medical emergency. This information is not intended to replace advice given to you by your health care provider. Make sure you discuss any questions you have with your health care provider. Document Revised: 06/30/2017 Document Reviewed: 10/27/2016 Elsevier Patient Education  2020 Elsevier Inc.  

## 2020-06-19 NOTE — Anesthesia Procedure Notes (Signed)
Procedure Name: Intubation Date/Time: 06/19/2020 7:42 AM Performed by: Griffin Dakin, CRNA Pre-anesthesia Checklist: Patient identified, Emergency Drugs available, Suction available and Patient being monitored Patient Re-evaluated:Patient Re-evaluated prior to induction Oxygen Delivery Method: Circle system utilized Preoxygenation: Pre-oxygenation with 100% oxygen Induction Type: IV induction Ventilation: Oral airway inserted - appropriate to patient size and Mask ventilation with difficulty Laryngoscope Size: Glidescope and 4 Grade View: Grade I Tube type: Oral Tube size: 7.5 mm Number of attempts: 1 Airway Equipment and Method: Stylet and Oral airway Placement Confirmation: ETT inserted through vocal cords under direct vision,  positive ETCO2 and breath sounds checked- equal and bilateral Secured at: 24 cm Tube secured with: Tape Dental Injury: Teeth and Oropharynx as per pre-operative assessment  Comments: Unsuccessful with MAC 4, ETT placed using glidescope go S4.

## 2020-06-19 NOTE — Progress Notes (Signed)
Client up and walked and tolerated well; right groin stable, no bleeding or hematoma; Dr Rayann Heman in and ok to d/c  home

## 2020-06-22 ENCOUNTER — Encounter (HOSPITAL_COMMUNITY): Payer: Self-pay | Admitting: Internal Medicine

## 2020-06-22 LAB — POCT ACTIVATED CLOTTING TIME
Activated Clotting Time: 257 seconds
Activated Clotting Time: 296 seconds

## 2020-06-22 MED ORDER — FUROSEMIDE 40 MG PO TABS: 40.0000 mg | ORAL_TABLET | Freq: Every day | ORAL | 0 refills | Status: DC

## 2020-06-22 NOTE — Progress Notes (Signed)
C/o cough post ablation, no pain, no fever.. States he has green phlegm in AM, told to monitor and call PCP if requires further attention. Pt verbalized understanding.

## 2020-06-23 DIAGNOSIS — K7460 Unspecified cirrhosis of liver: Secondary | ICD-10-CM | POA: Diagnosis not present

## 2020-06-24 ENCOUNTER — Ambulatory Visit (INDEPENDENT_AMBULATORY_CARE_PROVIDER_SITE_OTHER): Payer: Medicare Other

## 2020-06-24 ENCOUNTER — Ambulatory Visit (HOSPITAL_COMMUNITY)
Admission: EM | Admit: 2020-06-24 | Discharge: 2020-06-24 | Disposition: A | Discharge status: Home / Self Care | Payer: Medicare Other | Attending: Family Medicine | Admitting: Family Medicine

## 2020-06-24 ENCOUNTER — Other Ambulatory Visit: Payer: Self-pay

## 2020-06-24 ENCOUNTER — Encounter (HOSPITAL_COMMUNITY): Payer: Self-pay

## 2020-06-24 DIAGNOSIS — Z87891 Personal history of nicotine dependence: Secondary | ICD-10-CM | POA: Diagnosis not present

## 2020-06-24 DIAGNOSIS — Z20822 Contact with and (suspected) exposure to covid-19: Secondary | ICD-10-CM | POA: Diagnosis not present

## 2020-06-24 DIAGNOSIS — R058 Other specified cough: Secondary | ICD-10-CM

## 2020-06-24 DIAGNOSIS — R062 Wheezing: Secondary | ICD-10-CM

## 2020-06-24 DIAGNOSIS — J069 Acute upper respiratory infection, unspecified: Secondary | ICD-10-CM | POA: Diagnosis not present

## 2020-06-24 DIAGNOSIS — R059 Cough, unspecified: Secondary | ICD-10-CM | POA: Diagnosis not present

## 2020-06-24 LAB — SARS CORONAVIRUS 2 (TAT 6-24 HRS): SARS Coronavirus 2: NEGATIVE

## 2020-06-24 MED ORDER — HYDROCODONE-HOMATROPINE 5-1.5 MG/5ML PO SYRP
5.0000 mL | ORAL_SOLUTION | Freq: Four times a day (QID) | ORAL | 0 refills | Status: DC | PRN
Start: 2020-06-24 — End: 2020-07-17

## 2020-06-24 NOTE — Discharge Instructions (Addendum)
Be aware, your cough medication may cause drowsiness. Please do not drive, operate heavy machinery or make important decisions while on this medication, it can cloud your judgement.  

## 2020-06-24 NOTE — ED Triage Notes (Signed)
Pt in with c/o cough that started Friday after he had cardiac ablation. States that he called primary and they prescribed him 3 lasix tablets and he took the last one today.  States that he has just been getting worse

## 2020-06-24 NOTE — ED Provider Notes (Signed)
Timothy Dickson   [id number] 06/24/20 Arrival Time: 1696  ASSESSMENT & PLAN:  1. Viral URI with cough    I have personally viewed the imaging studies ordered this visit. No acute changes.  COVID-19 testing sent. OTC symptom care as needed.  If needed: Meds ordered this encounter  Medications  . HYDROcodone-homatropine (HYCODAN) 5-1.5 MG/5ML syrup    Sig: Take 5 mLs by mouth every 6 (six) hours as needed for cough.    Dispense:  90 mL    Refill:  0     Follow-up Information    Antony Contras, MD.   Specialty: Family Medicine Why: As needed. Contact information: [address] [place] [address] [phone]               Reviewed expectations re: course of current medical issues. Questions answered. Outlined signs and symptoms indicating need for more acute intervention. Understanding verbalized. After Visit Summary given.   SUBJECTIVE: History from: patient. Timothy Dickson is a 74 y.o. male who reports a fairly persistent cough and "just feeling bad" for the past 3-4 days. Cardiac ablation on 06/19/2020; initially questioned if related; Was Rx Lasix for 3 days. No changes in symptoms. Afebrile. No CP or SOB. Normal PO intake without n/v. Feels worse with exertion. Cough is affecting sleep.   OBJECTIVE:  Vitals:   06/24/20 1331 06/24/20 1333  BP: 99/63   Pulse: 60   Resp: 20   Temp:  97.8 F (36.6 C)  TempSrc: Oral Oral  SpO2: 100%     General appearance: alert; no distress Eyes: PERRLA; EOMI; conjunctiva normal HENT: [place]; AT; with nasal congestion Neck: supple  Lungs: speaks full sentences without difficulty; unlabored; dry cough; CTAB CV: regular Extremities: no edema Skin: warm and dry Neurologic: normal gait Psychological: alert and cooperative; normal mood and affect  Labs:  Labs Reviewed  SARS CORONAVIRUS 2 (TAT 6-24 HRS)    Imaging: DG Chest 2 View  Result Date: 06/24/2020 CLINICAL DATA:  Productive  cough, wheezing EXAM: CHEST - 2 VIEW COMPARISON:  03/18/2018 FINDINGS: Loop recorder projects over the left chest. Heart is normal size. No confluent opacities or effusions. No acute bony abnormality. IMPRESSION: No active cardiopulmonary disease. Electronically Signed   By: Rolm Baptise M.D.   On: 06/24/2020 14:59    Allergies  Allergen Reactions  . Acetaminophen Other (See Comments)    Pt states he was told by [facility]'s to keep tylenol dosage to 2000 mg per 24 hours due to cirrhosis of liver.  . 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 Pravastatin Other (See Comments)    Pt reports this med makes him generally feel unwell.    Past Medical History:  Diagnosis Date  . Atrial tachycardia ([place])    documented by holter 2016. Followed by Dr. Liane Comber and Dr. Rayann Heman.  . 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 no further tx other than surgery [facility].  [name] Cirrhosis ([place])    [facility] dx, early stage- follow up visits at [facility].  [name] CRI (chronic renal insufficiency)    s/p R nephrectomy in 1999 for malignancy  . Diabetes mellitus without complication (HCC)    NIDDM- dx. 3-4 years ago diet control  . Diverticulosis of colon    mild  . GERD (gastroesophageal reflux disease)   . Headache(784.0)   . Hepatitis C    s/p therapy [facility] (curative)/with Harvoni  . History  of BPH    no recent issues  . History of substance abuse ([place])    past history -none in 38 yrs  . Hyperglycemia   . Hypertension   . Osteoarthritis, knee    knees bilaterally   . Pneumonia 1975   hx of 35 years ago   Social History   Socioeconomic History  . Marital status: Married    Spouse name: Not on file  . Number of children: Not on file  . Years of education: Not on file  . Highest education level: Not on file  Occupational History  . Not on file  Tobacco Use  . Smoking status: Former Smoker    Packs/day: 1.00    Years: 15.00    Pack years: 15.00    Types: Cigarettes    Quit date:  08/01/1977    Years since quitting: 42.9  . Smokeless tobacco: Never Used  Vaping Use  . Vaping Use: Never used  Substance and Sexual Activity  . Alcohol use: No    Comment: past history -none in 38 yrs  . Drug use: No    Comment: past hx none in 38 yrs  . Sexual activity: Yes    Partners: Female  Other Topics Concern  . Not on file  Social History Narrative   [place], [place] psych. Married- '84. 2 step-daughters, 4 grandchildren. Work  Retired Radio producer  Marriage in good health.   Two story home   Drinks caffeine   Ambidextrous    Social Determinants of Health   Financial Resource Strain:   . Difficulty of Paying Living Expenses: Not on file  Food Insecurity:   . Worried About Charity fundraiser in the Last Year: Not on file  . Ran Out of Food in the Last Year: Not on file  Transportation Needs:   . Lack of Transportation (Medical): Not on file  . Lack of Transportation (Non-Medical): Not on file  Physical Activity:   . Days of Exercise per Week: Not on file  . Minutes of Exercise per Session: Not on file  Stress:   . Feeling of Stress : Not on file  Social Connections:   . Frequency of Communication with Friends and Family: Not on file  . Frequency of Social Gatherings with Friends and Family: Not on file  . Attends Religious Services: Not on file  . Active Member of Clubs or Organizations: Not on file  . Attends Archivist Meetings: Not on file  . Marital Status: Not on file  Intimate Partner Violence:   . Fear of Current or Ex-Partner: Not on file  . Emotionally Abused: Not on file  . Physically Abused: Not on file  . Sexually Abused: Not on file   Family History  Problem Relation Age of Onset  . Coronary artery disease Mother   . CVA Mother   . Prostate cancer Brother   . Alcohol abuse Father        variceal hemorrhage  . Alcohol abuse Sister   . Alcohol abuse Other        Strong family Hx  . Diabetes Sister    . Breast cancer Sister   . Lung cancer Sister   . Esophageal cancer Sister   . Cancer Brother        head and neck   Past Surgical History:  Procedure Laterality Date  . Arthroscopy right knee     may '11 (Dr Alvan Dame)  .  Arthroscopy, left knee,    . ATRIAL FIBRILLATION ABLATION N/A 10/11/2018   Procedure: ATRIAL FIBRILLATION ABLATION;  Surgeon: Thompson Grayer, MD;  Location: [place] [facility];  Service: Cardiovascular;  Laterality: N/A;  . ATRIAL FIBRILLATION ABLATION N/A 06/19/2020   Procedure: ATRIAL FIBRILLATION ABLATION;  Surgeon: Thompson Grayer, MD;  Location: [place] [facility];  Service: Cardiovascular;  Laterality: N/A;  . CARDIAC ELECTROPHYSIOLOGY MAPPING AND ABLATION    . history of liver biopsy  2007  . History of Nephrectomy Right 1999  . implantable loop recorder placement  03/19/2020    Medtronic Reveal Linq model LNQ 22 (RLB [id number] ) implantable loop recorder implanted by Dr Rayann Heman for evaluation of palpitations and afib management post ablation  . KNEE ARTHROSCOPY Left 10/10/2012   Procedure: LEFT KNEE ARTHROSCOPY WITH MENSCIAL DEBRIDEMENT AND CHONDROPLASTY;  Surgeon: Gearlean Alf, MD;  Location: [facility];  Service: Orthopedics;  Laterality: Left;  . left shoulder repair for bone spur  1990's  . TEE WITHOUT CARDIOVERSION N/A 10/11/2018   Procedure: TRANSESOPHAGEAL ECHOCARDIOGRAM (TEE);  Surgeon: Buford Dresser, MD;  [facility]: [facility];  Service: Cardiovascular;  Laterality: N/A;  ablation to follow at 1030  . TOTAL KNEE ARTHROPLASTY Right 08/15/2016   Procedure: RIGHT TOTAL KNEE ARTHROPLASTY, CORTISONE INJECTION OF LEFT KNEE;  Surgeon: Gaynelle Arabian, MD;  Location: [facility];  Service: Orthopedics;  Laterality: Right;  . TOTAL KNEE ARTHROPLASTY Left 09/04/2017   Procedure: LEFT TOTAL KNEE ARTHROPLASTY;  Surgeon: Gaynelle Arabian, MD;  Location: [facility];  Service: Orthopedics;  Laterality: Left;  . Transurethral needle ablation procedure    . TUNA procedure         Vanessa Kick, MD 06/24/20 1544

## 2020-06-28 LAB — CUP PACEART REMOTE DEVICE CHECK
Date Time Interrogation Session: 20211126195828
Implantable Pulse Generator Implant Date: 20210819

## 2020-06-29 ENCOUNTER — Ambulatory Visit (INDEPENDENT_AMBULATORY_CARE_PROVIDER_SITE_OTHER): Payer: Medicare Other

## 2020-06-29 ENCOUNTER — Telehealth (HOSPITAL_COMMUNITY): Payer: Self-pay | Admitting: Internal Medicine

## 2020-06-29 DIAGNOSIS — I480 Paroxysmal atrial fibrillation: Secondary | ICD-10-CM

## 2020-06-29 MED ORDER — HYDROCODONE-HOMATROPINE 5-1.5 MG/5ML PO SYRP
5.0000 mL | ORAL_SOLUTION | Freq: Four times a day (QID) | ORAL | 0 refills | Status: DC | PRN
Start: 2020-06-29 — End: 2020-07-17

## 2020-06-29 NOTE — Telephone Encounter (Signed)
Medication resent to [facility] due to unavailability at the [facility].

## 2020-07-01 DIAGNOSIS — J40 Bronchitis, not specified as acute or chronic: Secondary | ICD-10-CM | POA: Diagnosis not present

## 2020-07-03 NOTE — Progress Notes (Signed)
Carelink Summary Report / Loop Recorder 

## 2020-07-15 ENCOUNTER — Telehealth (HOSPITAL_COMMUNITY): Payer: Self-pay | Admitting: *Deleted

## 2020-07-15 NOTE — Telephone Encounter (Signed)
Patient notified of linq findings. Reassured back in normal rhythm now pt had taken mucinex DM last night. Educated on avoiding decongestants and also breakthrough afib post ablation during 3 month healing phase is expected. Pt has follow up appointment on Friday for 1 month post ablation check.

## 2020-07-15 NOTE — Telephone Encounter (Signed)
Presenting rhythm on remote transmission is SR. Patient had 1 event the LINQ II determined AF that appears to have started at [place] on 07/15/20 and lasted 1 hour , 32 minutes.

## 2020-07-15 NOTE — Telephone Encounter (Signed)
Patient questioning if he is back in AF around 2 am when he went to the bathroom he felt like his heart "paused" unsure if he is still in AF or not. Pt will attempt to send transmission to characterize rhythm.

## 2020-07-16 NOTE — Progress Notes (Signed)
Primary Care Physician: Antony Contras, MD Primary Cardiologist: Dr Meda Coffee Primary Electrophysiologist: Dr Rogue Jury is a 74 y.o. male with a history of persistent atrial fibrillation, rt nephrectomy 2/2 renal CA, and cirrhosis who presents for follow up in the [facility]. Patient is s/p afib ablation with Dr Rayann Heman on 10/11/18. Patient reports that he has done reasonably well since his last visit. He does have infrequent palpitations associated with dizziness. He reports that he has noted pain and tingling in his feet at night since starting the flecainide. These symptoms resolve at lower doses.   On follow up today, patient reports he has noticed heart racing when he woke today. ECG shows atypical flutter vs coarse afib. He has been dealing with a viral URI for 3 weeks and has been taking decongestants. He denies any CP, swallowing, or groin issues.   Today, he denies symptoms of chest pain, orthopnea, PND, lower extremity edema, dizziness, presyncope, syncope, snoring, daytime somnolence, bleeding, or neurologic sequela. The patient is tolerating medications without difficulties and is otherwise without complaint today.   Atrial Fibrillation Risk Factors:  he does not have symptoms or diagnosis of sleep apnea. he does not have a history of rheumatic fever. he does not have a history of alcohol use.  he has a BMI of Body mass index is 28.59 kg/m.[name] Filed Weights   07/17/20 0934  Weight: 95.6 kg     Atrial Fibrillation Management history:  Previous antiarrhythmic drugs: flecainide Previous cardioversions: 03/18/18 Previous ablations: 10/11/18, 06/19/20 CHADS2VASC score: 4 Anticoagulation history: Eliquis   Past Medical History:  Diagnosis Date  . Atrial tachycardia ([place])    documented by holter 2016. Followed by Dr. Liane Comber and Dr. Rayann Heman.  . 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 no further tx other than surgery [facility].  [name]  Cirrhosis ([place])    [facility] dx, early stage- follow up visits at [facility].  [name] CRI (chronic renal insufficiency)    s/p R nephrectomy in 1999 for malignancy  . Diabetes mellitus without complication (HCC)    NIDDM- dx. 3-4 years ago diet control  . Diverticulosis of colon    mild  . GERD (gastroesophageal reflux disease)   . Headache(784.0)   . Hepatitis C    s/p therapy [facility] (curative)/with Harvoni  . History of BPH    no recent issues  . History of substance abuse ([place])    past history -none in 38 yrs  . Hyperglycemia   . Hypertension   . Osteoarthritis, knee    knees bilaterally   . Pneumonia 1975   hx of 35 years ago   Past Surgical History:  Procedure Laterality Date  . Arthroscopy right knee     may '11 (Dr Alvan Dame)  . Arthroscopy, left knee,    . ATRIAL FIBRILLATION ABLATION N/A 10/11/2018   Procedure: ATRIAL FIBRILLATION ABLATION;  Surgeon: Thompson Grayer, MD;  Location: [place] [facility];  Service: Cardiovascular;  Laterality: N/A;  . ATRIAL FIBRILLATION ABLATION N/A 06/19/2020   Procedure: ATRIAL FIBRILLATION ABLATION;  Surgeon: Thompson Grayer, MD;  Location: [place] [facility];  Service: Cardiovascular;  Laterality: N/A;  . CARDIAC ELECTROPHYSIOLOGY MAPPING AND ABLATION    . history of liver biopsy  2007  . History of Nephrectomy Right 1999  . implantable loop recorder placement  03/19/2020    Medtronic Reveal Linq model LNQ 22 (RLB [id number] ) implantable loop recorder implanted by Dr Rayann Heman for evaluation of palpitations  and afib management post ablation  . KNEE ARTHROSCOPY Left 10/10/2012   Procedure: LEFT KNEE ARTHROSCOPY WITH MENSCIAL DEBRIDEMENT AND CHONDROPLASTY;  Surgeon: Gearlean Alf, MD;  [facility];  Service: Orthopedics;  Laterality: Left;  . left shoulder repair for bone spur  1990's  . TEE WITHOUT CARDIOVERSION N/A 10/11/2018   Procedure: TRANSESOPHAGEAL ECHOCARDIOGRAM (TEE);  Surgeon: Buford Dresser, MD;  Location: [facility];  Service:  Cardiovascular;  Laterality: N/A;  ablation to follow at 1030  . TOTAL KNEE ARTHROPLASTY Right 08/15/2016   Procedure: RIGHT TOTAL KNEE ARTHROPLASTY, CORTISONE INJECTION OF LEFT KNEE;  Surgeon: Gaynelle Arabian, MD;  Location: [facility];  Service: Orthopedics;  Laterality: Right;  . TOTAL KNEE ARTHROPLASTY Left 09/04/2017   Procedure: LEFT TOTAL KNEE ARTHROPLASTY;  Surgeon: Gaynelle Arabian, MD;  [facility];  Service: Orthopedics;  Laterality: Left;  . Transurethral needle ablation procedure    . TUNA procedure      Current Outpatient Medications  Medication Sig Dispense Refill  . acetaminophen (TYLENOL) 650 MG CR tablet Take 650 mg by mouth See admin instructions. Take 650 mg at 11 am and take 650 mg at 4-5 am (take with gabapentin)    . allopurinol (ZYLOPRIM) 100 MG tablet Take 100 mg by mouth every evening.     . Aloe-Sodium Chloride (AYR SALINE NASAL GEL NA) Place 1 application into the nose at bedtime.    . carvedilol (COREG) 6.25 MG tablet Take 1 tablet (6.25 mg total) by mouth 2 (two) times daily. 180 tablet 3  . ELIQUIS 5 MG TABS tablet TAKE 1 TABLET BY MOUTH TWICE DAILY. 60 tablet 6  . famotidine (PEPCID) 20 MG tablet Take 20 mg by mouth daily as needed for heartburn.     . flecainide (TAMBOCOR) 100 MG tablet Take 1 tablet (100 mg total) by mouth 2 (two) times daily. 60 tablet 6  . gabapentin (NEURONTIN) 600 MG tablet Take 600 mg by mouth See admin instructions. Take 600 mg at 11pm and 600 mg at 4-5 am    . hydrochlorothiazide (HYDRODIURIL) 25 MG tablet TAKE 1 TABLET BY MOUTH DAILY. 90 tablet 3  . Multiple Vitamin (MULTIVITAMIN) capsule Take 1 capsule by mouth daily.    . Multiple Vitamins-Minerals (IMMUNE SUPPORT PO) Take 2 tablets by mouth daily.     [name] omeprazole (PRILOSEC) 20 MG capsule Take 20 mg by mouth every other day.     [name] PRESCRIPTION MEDICATION Apply 1 application topically 2 (two) times daily. FLUOROURACIL 5% + CALCIPOTRIENE 0.005% apply to face twice daily for 4-5 days for sun  damage    . REPATHA SURECLICK 967 MG/ML SOAJ INJECT CONTENTS OF 1 PEN SUBCUTANEOUSLY EVERY 14 DAYS. 2 mL 11  . sodium chloride (OCEAN) 0.65 % SOLN nasal spray Place 1 spray into both nostrils daily.    [name] albuterol (VENTOLIN HFA) 108 (90 Base) MCG/ACT inhaler Inhale into the lungs.    . furosemide (LASIX) 40 MG tablet Take 1 tablet (40 mg total) by mouth daily for 3 days. 3 tablet 0   No current facility-administered medications for this encounter.    Allergies  Allergen Reactions  . Tramadol Hcl     Other reaction(s): ineffective  . Acetaminophen Other (See Comments)    Pt states he was told by [facility] to keep tylenol dosage to 2000 mg per 24 hours due to cirrhosis of liver.  . 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 Pravastatin Other (  See Comments)    Pt reports this med makes him generally feel unwell.    Social History   Socioeconomic History  . Marital status: Married    Spouse name: Not on file  . Number of children: Not on file  . Years of education: Not on file  . Highest education level: Not on file  Occupational History  . Not on file  Tobacco Use  . Smoking status: Former Smoker    Packs/day: 1.00    Years: 15.00    Pack years: 15.00    Types: Cigarettes    Quit date: 08/01/1977    Years since quitting: 42.9  . Smokeless tobacco: Never Used  Vaping Use  . Vaping Use: Never used  Substance and Sexual Activity  . Alcohol use: No    Comment: past history -none in 38 yrs  . Drug use: No    Comment: past hx none in 38 yrs  . Sexual activity: Yes    Partners: Female  Other Topics Concern  . Not on file  Social History Narrative   [place], [place] psych. Married- '84. 2 step-daughters, 4 grandchildren. Work  Retired Radio producer  Marriage in good health.   Two story home   Drinks caffeine   Ambidextrous    Social Determinants of Health   Financial Resource Strain: Not on file  Food Insecurity: Not  on file  Transportation Needs: Not on file  Physical Activity: Not on file  Stress: Not on file  Social Connections: Not on file  Intimate Partner Violence: Not on file    Family History  Problem Relation Age of Onset  . Coronary artery disease Mother   . CVA Mother   . Prostate cancer Brother   . Alcohol abuse Father        variceal hemorrhage  . Alcohol abuse Sister   . Alcohol abuse Other        Strong family Hx  . Diabetes Sister   . Breast cancer Sister   . Lung cancer Sister   . Esophageal cancer Sister   . Cancer Brother        head and neck    ROS- All systems are reviewed and negative except as per the HPI above.  Physical Exam: Vitals:   07/17/20 0934  BP: 110/76  Pulse: (!) 121  Weight: 95.6 kg  Height: 6' (1.829 m)    GEN- The patient is well appearing elderly male, alert and oriented x 3 today.   HEENT-head normocephalic, atraumatic, sclera clear, conjunctiva pink, hearing intact, trachea midline. Lungs- Clear to ausculation bilaterally, normal work of breathing Heart- irregular rate and rhythm, no murmurs, rubs or gallops  GI- soft, NT, ND, + BS Extremities- no clubbing, cyanosis, or edema MS- no significant deformity or atrophy Skin- no rash or lesion Psych- euthymic mood, full affect Neuro- strength and sensation are intact   Wt Readings from Last 3 Encounters:  07/17/20 95.6 kg  06/19/20 97.5 kg  05/15/20 98.3 kg    EKG today demonstrates atypical atrial flutter with variable block vs coarse afib HR 121, QRS 98, QTc 474  Echo 03/23/18 demonstrated: - Left ventricle: The cavity size was normal. Systolic function was   normal. The estimated ejection fraction was in the range of 60%   to 65%. Wall motion was normal; there were no regional wall   motion abnormalities. Doppler parameters are consistent with   abnormal left ventricular relaxation (grade 1 diastolic  dysfunction). There was no evidence of elevated ventricular   filling  pressure by Doppler parameters. - Aortic valve: There was no regurgitation. - Aortic root: The aortic root was normal in size. - Left atrium: The atrium was mildly dilated. - Right ventricle: The cavity size was normal. Wall thickness was   normal. Systolic function was normal. - Right atrium: The atrium was normal in size. - Tricuspid valve: There was mild regurgitation. - Pulmonary arteries: Systolic pressure was at the upper limits of   normal. - Inferior vena cava: The vessel was normal in size. - Pericardium, extracardiac: There was no pericardial effusion.  ETT 04/17/18 ETT with good exercise tolerance (9:09); no chest pain; normal blood pressure response; no ST changes; negative adequate exercise tolerance test; [facility] treadmill score 9.  Epic records are reviewed at length today  Assessment and Plan:  1. Persistent atrial fibrillation S/p afib ablation 10/11/18 with Dr Rayann Heman and repeat ablation 06/19/20. Patient is in out of rhythm today.  Will have him take an extra mid day dose of flecainide 100 mg today to try and chemically cardiovert. If this is unsuccessful, he may reattempt  again on Sunday 12/19. Patient to call back on Monday with update. If he does not convert chemically, will arrange for DCCV.  Reassured patient that some afib is common post ablation.  Continue Coreg 6.25 mg BID (will not increase at this point with h/o bradycardia) Continue Eliquis 5 mg BID  This patients CHA2DS2-VASc Score and unadjusted Ischemic Stroke Rate (% per year) is equal to 4.8 % stroke rate/year from a score of 4  Above score calculated as 1 point each if present [CHF, HTN, DM, Vascular=MI/PAD/Aortic Plaque, Age if 65-74, or Male] Above score calculated as 2 points each if present [Age > 75, or Stroke/TIA/TE]  2. HTN Stable, no changes today.   Follow up with Dr Rayann Heman as scheduled. Sooner in AF clinic if DCCV is needed.    [facility] [address] [place], [place] [address] [phone]

## 2020-07-17 ENCOUNTER — Ambulatory Visit (HOSPITAL_COMMUNITY)
Admission: RE | Admit: 2020-07-17 | Discharge: 2020-07-17 | Disposition: A | Discharge status: Home / Self Care | Payer: Medicare Other | Source: Ambulatory Visit | Attending: Physician Assistant | Admitting: Physician Assistant

## 2020-07-17 ENCOUNTER — Encounter (HOSPITAL_COMMUNITY): Payer: Self-pay | Admitting: Physician Assistant

## 2020-07-17 ENCOUNTER — Other Ambulatory Visit: Payer: Self-pay

## 2020-07-17 VITALS — BP 110/76 | HR 121 | Ht 72.0 in | Wt 210.8 lb

## 2020-07-17 DIAGNOSIS — N189 Chronic kidney disease, unspecified: Secondary | ICD-10-CM | POA: Diagnosis not present

## 2020-07-17 DIAGNOSIS — Z79899 Other long term (current) drug therapy: Secondary | ICD-10-CM | POA: Diagnosis not present

## 2020-07-17 DIAGNOSIS — K7460 Unspecified cirrhosis of liver: Secondary | ICD-10-CM | POA: Insufficient documentation

## 2020-07-17 DIAGNOSIS — Z7901 Long term (current) use of anticoagulants: Secondary | ICD-10-CM | POA: Diagnosis not present

## 2020-07-17 DIAGNOSIS — D6869 Other thrombophilia: Secondary | ICD-10-CM | POA: Diagnosis not present

## 2020-07-17 DIAGNOSIS — I4819 Other persistent atrial fibrillation: Secondary | ICD-10-CM | POA: Insufficient documentation

## 2020-07-17 DIAGNOSIS — Z87891 Personal history of nicotine dependence: Secondary | ICD-10-CM | POA: Diagnosis not present

## 2020-07-17 DIAGNOSIS — E1122 Type 2 diabetes mellitus with diabetic chronic kidney disease: Secondary | ICD-10-CM | POA: Diagnosis not present

## 2020-07-17 DIAGNOSIS — I130 Hypertensive heart and chronic kidney disease with heart failure and stage 1 through stage 4 chronic kidney disease, or unspecified chronic kidney disease: Secondary | ICD-10-CM | POA: Insufficient documentation

## 2020-07-17 DIAGNOSIS — Z8249 Family history of ischemic heart disease and other diseases of the circulatory system: Secondary | ICD-10-CM | POA: Diagnosis not present

## 2020-07-20 ENCOUNTER — Telehealth (HOSPITAL_COMMUNITY): Payer: Self-pay

## 2020-07-20 NOTE — Telephone Encounter (Signed)
Patient called in and states he is back in Normal sinus rhythm. Per Tilda Franco advised patient to take an extra mid day dose of Flecainide 100mg  and he converted to Normal sinus rhythm on Friday. His HR is 58 and his blood pressure is 130/78. He will contact the clinic back if he has any other concerns.

## 2020-07-29 ENCOUNTER — Ambulatory Visit: Payer: Medicare Other | Admitting: Family Medicine

## 2020-07-30 LAB — CUP PACEART REMOTE DEVICE CHECK
Date Time Interrogation Session: 20211229195404
Implantable Pulse Generator Implant Date: 20210819

## 2020-08-03 ENCOUNTER — Ambulatory Visit (INDEPENDENT_AMBULATORY_CARE_PROVIDER_SITE_OTHER): Payer: Medicare Other

## 2020-08-03 DIAGNOSIS — I4819 Other persistent atrial fibrillation: Secondary | ICD-10-CM

## 2020-08-12 DIAGNOSIS — R430 Anosmia: Secondary | ICD-10-CM | POA: Diagnosis not present

## 2020-08-12 DIAGNOSIS — Z20822 Contact with and (suspected) exposure to covid-19: Secondary | ICD-10-CM | POA: Diagnosis not present

## 2020-08-13 DIAGNOSIS — Z20822 Contact with and (suspected) exposure to covid-19: Secondary | ICD-10-CM | POA: Diagnosis not present

## 2020-08-17 NOTE — Progress Notes (Signed)
Carelink Summary Report / Loop Recorder 

## 2020-09-02 LAB — CUP PACEART REMOTE DEVICE CHECK
Date Time Interrogation Session: 20220131195707
Implantable Pulse Generator Implant Date: 20210819

## 2020-09-04 DIAGNOSIS — H524 Presbyopia: Secondary | ICD-10-CM | POA: Diagnosis not present

## 2020-09-04 LAB — HM DIABETES EYE EXAM

## 2020-09-07 ENCOUNTER — Ambulatory Visit (INDEPENDENT_AMBULATORY_CARE_PROVIDER_SITE_OTHER): Payer: Medicare Other

## 2020-09-07 ENCOUNTER — Telehealth: Payer: Self-pay

## 2020-09-07 DIAGNOSIS — I4819 Other persistent atrial fibrillation: Secondary | ICD-10-CM | POA: Diagnosis not present

## 2020-09-07 NOTE — Telephone Encounter (Signed)
Okay to reschedule NPV Friday @ 1pm  Thanks

## 2020-09-07 NOTE — Telephone Encounter (Signed)
Pt is scheduled °

## 2020-09-07 NOTE — Telephone Encounter (Signed)
Pt had a new pt appt scheduled for this coming Wednesday. However, he has to reschedule for Rogers Blocker being out of office. Next available New pt is Sept. Can I work pt in earlier?

## 2020-09-09 ENCOUNTER — Ambulatory Visit: Payer: Medicare Other | Admitting: Family Medicine

## 2020-09-11 ENCOUNTER — Ambulatory Visit (INDEPENDENT_AMBULATORY_CARE_PROVIDER_SITE_OTHER): Payer: Medicare Other | Admitting: Family Medicine

## 2020-09-11 ENCOUNTER — Encounter: Payer: Self-pay | Admitting: Family Medicine

## 2020-09-11 ENCOUNTER — Telehealth: Payer: Self-pay

## 2020-09-11 ENCOUNTER — Other Ambulatory Visit: Payer: Self-pay

## 2020-09-11 VITALS — BP 110/60 | HR 79 | Temp 98.0°F | Ht 72.0 in | Wt 213.0 lb

## 2020-09-11 DIAGNOSIS — E785 Hyperlipidemia, unspecified: Secondary | ICD-10-CM

## 2020-09-11 DIAGNOSIS — H6122 Impacted cerumen, left ear: Secondary | ICD-10-CM

## 2020-09-11 DIAGNOSIS — E119 Type 2 diabetes mellitus without complications: Secondary | ICD-10-CM | POA: Diagnosis not present

## 2020-09-11 DIAGNOSIS — I10 Essential (primary) hypertension: Secondary | ICD-10-CM | POA: Diagnosis not present

## 2020-09-11 DIAGNOSIS — I712 Thoracic aortic aneurysm, without rupture: Secondary | ICD-10-CM

## 2020-09-11 DIAGNOSIS — B182 Chronic viral hepatitis C: Secondary | ICD-10-CM

## 2020-09-11 DIAGNOSIS — M109 Gout, unspecified: Secondary | ICD-10-CM | POA: Insufficient documentation

## 2020-09-11 DIAGNOSIS — M1000 Idiopathic gout, unspecified site: Secondary | ICD-10-CM

## 2020-09-11 DIAGNOSIS — I7121 Aneurysm of the ascending aorta, without rupture: Secondary | ICD-10-CM

## 2020-09-11 DIAGNOSIS — I4819 Other persistent atrial fibrillation: Secondary | ICD-10-CM | POA: Diagnosis not present

## 2020-09-11 LAB — CBC WITH DIFFERENTIAL/PLATELET
Basophils Absolute: 0.1 10*3/uL (ref 0.0–0.1)
Basophils Relative: 0.7 % (ref 0.0–3.0)
Eosinophils Absolute: 0.2 10*3/uL (ref 0.0–0.7)
Eosinophils Relative: 2.5 % (ref 0.0–5.0)
HCT: 42.7 % (ref 39.0–52.0)
Hemoglobin: 14.6 g/dL (ref 13.0–17.0)
Lymphocytes Relative: 17.8 % (ref 12.0–46.0)
Lymphs Abs: 1.4 10*3/uL (ref 0.7–4.0)
MCHC: 34.3 g/dL (ref 30.0–36.0)
MCV: 91.5 fl (ref 78.0–100.0)
Monocytes Absolute: 0.5 10*3/uL (ref 0.1–1.0)
Monocytes Relative: 6.9 % (ref 3.0–12.0)
Neutro Abs: 5.6 10*3/uL (ref 1.4–7.7)
Neutrophils Relative %: 72.1 % (ref 43.0–77.0)
Platelets: 218 10*3/uL (ref 150.0–400.0)
RBC: 4.66 Mil/uL (ref 4.22–5.81)
RDW: 13.1 % (ref 11.5–15.5)
WBC: 7.7 10*3/uL (ref 4.0–10.5)

## 2020-09-11 LAB — COMPREHENSIVE METABOLIC PANEL
ALT: 18 U/L (ref 0–53)
AST: 28 U/L (ref 0–37)
Albumin: 4.6 g/dL (ref 3.5–5.2)
Alkaline Phosphatase: 60 U/L (ref 39–117)
BUN: 22 mg/dL (ref 6–23)
CO2: 29 mEq/L (ref 19–32)
Calcium: 9.9 mg/dL (ref 8.4–10.5)
Chloride: 98 mEq/L (ref 96–112)
Creatinine, Ser: 1.38 mg/dL (ref 0.40–1.50)
GFR: 50.23 mL/min — ABNORMAL LOW (ref >60.00)
Glucose, Bld: 133 mg/dL — ABNORMAL HIGH (ref 70–99)
Potassium: 3.9 mEq/L (ref 3.5–5.1)
Sodium: 135 mEq/L (ref 135–145)
Total Bilirubin: 0.7 mg/dL (ref 0.2–1.2)
Total Protein: 7.3 g/dL (ref 6.0–8.3)

## 2020-09-11 LAB — MICROALBUMIN / CREATININE URINE RATIO
Creatinine,U: 71.7 mg/dL
Microalb Creat Ratio: 1.5 mg/g (ref 0.0–30.0)
Microalb, Ur: 1.1 mg/dL (ref 0.0–1.9)

## 2020-09-11 MED ORDER — ALLOPURINOL 100 MG PO TABS
100.0000 mg | ORAL_TABLET | Freq: Every day | ORAL | 3 refills | Status: DC
Start: 2020-09-11 — End: 2021-10-25

## 2020-09-11 MED ORDER — GABAPENTIN 600 MG PO TABS
600.0000 mg | ORAL_TABLET | Freq: Two times a day (BID) | ORAL | 3 refills | Status: DC
Start: 2020-09-11 — End: 2020-11-30

## 2020-09-11 NOTE — Progress Notes (Signed)
Patient: Timothy Dickson MRN: [id number] DOB: 1945/09/02 PCP: Orma Flaming, MD     Subjective:  Chief Complaint  Patient presents with  . Establish Care  . Diabetes  . Hypertension  . Hyperlipidemia  . Atrial Fibrillation  . Hepatitis C    HPI The patient is a 75 y.o. male who presents today to establish care. He would like to discuss DM. 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 He is followed by multiple specialists.   1) hypertension Hypertension: Here for follow up of hypertension.  Currently on hctz 25mg  daily, lasix 40mg  daily and coreg 6.25mg  BID. Takes medication as prescribed and denies any side effects. Exercise includes walking. Weight has been stable. Denies any chest pain, headaches, shortness of breath, vision changes, swelling in lower extremities.   2) diabetes Diabetes: Patient is here for follow up of type 2 diabetes.  Currently on the following medications none, diet controlled.  Last A1C was 7.1. Currently exercising and following diabetic diet. Denies any hypoglycemic events. Denies any vision changes, nausea, vomiting, abdominal pain, ulcers in feet, polyuria, polydipsia or polyphagia. Denies any chest pain, shortness of breath. Does have neuropathy.   3) afib on chronic anticoagulation.  Followed by both electrophysiology and cardiology respectively. SR today. On flecainide and eliquis daily.    4) compensated cirrhosis secondary to hep C Followed at [facility] q 6 months. S/p harvoni. Overall doing well with normal liver enzymes.   5) hyperlipidemia On repatha, extremely well controlled. Last lipid panel was in 9/21 and to goal. LDL well below 70.  6) ascending aortic aneurysm.  Last MR angio chest was in 06/2019. Max diameter  of 4.2cm. annual imaging recommended and already ordered for this year.    HM reviewed. He is utd on all of his HM.   Review of Systems  Constitutional: Negative for appetite change, chills, fatigue and fever.  HENT: Negative for dental problem, ear pain, hearing loss and trouble swallowing.   Eyes: Negative for visual disturbance.  Respiratory: Negative for cough, chest tightness and shortness of breath.   Cardiovascular: Negative for chest pain, palpitations and leg swelling.  Gastrointestinal: Negative for abdominal pain, blood in stool, diarrhea and nausea.  Endocrine: Negative for cold intolerance, polydipsia, polyphagia and polyuria.  Genitourinary: Negative for dysuria and hematuria.  Musculoskeletal: Negative for arthralgias.  Skin: Negative for rash.  Neurological: Negative for dizziness and headaches.  Psychiatric/Behavioral: Negative for dysphoric mood and sleep disturbance. The patient is not nervous/anxious.     Allergies Patient is allergic to tramadol hcl, acetaminophen, nsaids, and pravastatin.  Past Medical History Patient  has a past medical history of Atrial tachycardia ([place]), Cancer of kidney ([place]), Cirrhosis ([place]), CRI (chronic renal insufficiency), Diabetes mellitus without complication ([place]), Diverticulosis of colon, GERD (gastroesophageal reflux disease), Headache(784.0), Hepatitis C, History of BPH, History of substance abuse ([place]), Hyperglycemia, Hypertension, Osteoarthritis, knee, and Pneumonia (1975).  Surgical History Patient  has a past surgical history that includes Arthroscopy, left knee,; left shoulder repair for bone spur (1990's); history of liver biopsy (2007); Transurethral needle ablation procedure; TUNA procedure; History of Nephrectomy (Right, 1999); Arthroscopy right knee; Knee arthroscopy (Left, 10/10/2012); Total knee arthroplasty (Right, 08/15/2016); Total knee arthroplasty (Left, 09/04/2017); ATRIAL FIBRILLATION ABLATION (N/A, 10/11/2018); TEE  without cardioversion (N/A, 10/11/2018);  implantable loop recorder placement (03/19/2020); ATRIAL FIBRILLATION ABLATION (N/A, 06/19/2020); and Cardiac electrophysiology mapping and ablation.  Family History Pateint's family history includes Alcohol abuse in his father, sister, and another family member; Breast cancer in his sister; CVA in his mother; Cancer in his brother; Coronary artery disease in his mother; Diabetes in his sister; Esophageal cancer in his sister; Lung cancer in his sister; Prostate cancer in his brother.  Social History Patient  reports that he quit smoking about 43 years ago. His smoking use included cigarettes. He has a 15.00 pack-year smoking history. He has never used smokeless tobacco. He reports that he does not drink alcohol and does not use drugs.    Objective: Vitals:   09/11/20 1306  BP: 110/60  Pulse: 79  Temp: 98 F (36.7 C)  TempSrc: Temporal  SpO2: 96%  Weight: 213 lb (96.6 kg)  Height: 6' (1.829 m)    Body mass index is 28.89 kg/m.  Physical Exam Vitals reviewed.  Constitutional:      Appearance: Normal appearance. He is well-developed, normal weight and well-nourished.  HENT:     Head: Normocephalic and atraumatic.     Right Ear: Tympanic membrane, ear canal and external ear normal.     Left Ear: Ear canal and external ear normal. There is impacted cerumen.     Nose: Nose normal.     Mouth/Throat:     Mouth: Oropharynx is clear and moist. Mucous membranes are moist.  Eyes:     Extraocular Movements: EOM normal.     Conjunctiva/sclera: Conjunctivae normal.     Pupils: Pupils are equal, round, and reactive to light.  Neck:     Thyroid: No thyromegaly.     Vascular: No carotid bruit.  Cardiovascular:     Rate and Rhythm: Normal rate and regular rhythm.     Pulses: Normal pulses and intact distal pulses.     Heart sounds: Normal heart sounds. No murmur heard.   Pulmonary:     Effort: Pulmonary effort is normal.     Breath sounds:  Normal breath sounds.  Abdominal:     General: Bowel sounds are normal. There is no distension.     Palpations: Abdomen is soft.     Tenderness: There is no abdominal tenderness.  Musculoskeletal:     Cervical back: Normal range of motion and neck supple.  Lymphadenopathy:     Cervical: No cervical adenopathy.  Skin:    General: Skin is warm and dry.     Capillary Refill: Capillary refill takes less than 2 seconds.     Findings: No rash.  Neurological:     General: No focal deficit present.     Mental Status: He is alert and oriented to person, place, and time.     Cranial Nerves: No cranial nerve deficit.     Coordination: Coordination normal.     Deep Tendon Reflexes: Reflexes normal.  Psychiatric:        Mood and Affect: Mood and affect and mood normal.        Behavior: Behavior normal.    [facility] Office Visit from 09/11/2020 in [place]  PHQ-2 Total Score 0        Ceruminosis is noted.  Verbal consent obtained. Wax is removed by syringing and manual debridement. Instructions for home care to prevent wax buildup are given. Tolerated procedure well.   Assessment/plan: 1. Essential hypertension, benign Blood pressure is to goal. Continue current anti-hypertensive medications-coreg 6.25mg  BID, hctz  25mg /daily and lasix 40mg /daily. Refills not given and routine lab work will be done today. Recommended routine exercise and healthy diet including DASH diet and mediterranean diet. Encouraged weight loss. F/u in 6 months.   - CBC with Differential/Platelet - Comprehensive metabolic panel  2. Persistent atrial fibrillation ([place]) Followed by cards and EP. anticoagulated and on flecainide. Maintaining NSR.   3. Chronic hepatitis C without hepatic coma (HCC) Cured s/p harvoni. [facility] by [facility]. Checking lfts' today.   4. Controlled type 2 diabetes mellitus without complication, without long-term current use of insulin ([place]) Appears to be well  controlled with diet alone looking at past labs. Checking a1c today and encouraged her continue diabetic diet and exercise. discussed goal for a1c for his age. Reviewed HM and diabetic guidelines. On repatha with goal LDL. No ace-I or ARB. Checking up/uc today.  -utd on eye exam (had done this week) -utd on vaccines -foot exam next visit.  - Hemoglobin A1c - Microalbumin / creatinine urine ratio  5. Ascending aortic aneurysm ([place]) utd on imaging. Repeat MR angio chest this coming September. Stable since 2018 imaging.   6. Impacted cerumen of left ear Irrigated today with removal of cerumen.   7. Hyperlipidemia -statin intolerant. On repatha and last LDL to goal below 70 in 04/2019. Reviewed today.   Total time of encounter: 60 minutes total time of encounter, including 40 minutes spent in face-to-face patient care. This time includes coordination of care and counseling regarding complex medical history, medications, management. Remainder of non-face-to-face time involved reviewing chart documents/testing relevant to the patient encounter and documentation in the medical record.    This visit occurred during the SARS-CoV-2 public health emergency.  Safety protocols were in place, including screening questions prior to the visit, additional usage of staff PPE, and extensive cleaning of exam room while observing appropriate contact time as indicated for disinfecting solutions.     Return in about 6 months (around 03/11/2021) for diabetes,htn, chronic fu .   Orma Flaming, MD [place]

## 2020-09-11 NOTE — Telephone Encounter (Signed)
Patient states dr.wolfe asked for other drs. Patient sees and Patient states Timothy Dickson at [facility] and spine.  FYI

## 2020-09-11 NOTE — Progress Notes (Signed)
Carelink Summary Report / Loop Recorder 

## 2020-09-12 ENCOUNTER — Encounter: Payer: Self-pay | Admitting: Family Medicine

## 2020-09-14 LAB — HEMOGLOBIN A1C: Hgb A1c MFr Bld: 7.1 % — ABNORMAL HIGH (ref 4.6–6.5)

## 2020-09-18 ENCOUNTER — Encounter: Payer: Self-pay | Admitting: Internal Medicine

## 2020-09-18 ENCOUNTER — Ambulatory Visit: Payer: Medicare Other | Admitting: Internal Medicine

## 2020-09-18 ENCOUNTER — Other Ambulatory Visit: Payer: Self-pay

## 2020-09-18 VITALS — BP 126/70 | HR 59 | Ht 72.0 in | Wt 215.0 lb

## 2020-09-18 DIAGNOSIS — D6869 Other thrombophilia: Secondary | ICD-10-CM | POA: Diagnosis not present

## 2020-09-18 DIAGNOSIS — I480 Paroxysmal atrial fibrillation: Secondary | ICD-10-CM

## 2020-09-18 DIAGNOSIS — I10 Essential (primary) hypertension: Secondary | ICD-10-CM

## 2020-09-18 LAB — CUP PACEART INCLINIC DEVICE CHECK
Date Time Interrogation Session: 20220218163731
Implantable Pulse Generator Implant Date: 20210819

## 2020-09-18 NOTE — Patient Instructions (Addendum)
Medication Instructions:  Your physician recommends that you continue on your current medications as directed. Please refer to the Current Medication list given to you today.  Labwork: None ordered.  Testing/Procedures: None ordered.  Follow-Up: Your physician wants you to follow-up in: 12/16/20 at 2 pm with Dr. Rayann Heman   Any Other Special Instructions Will Be Listed Below (If Applicable).  If you need a refill on your cardiac medications before your next appointment, please call your pharmacy.

## 2020-09-18 NOTE — Progress Notes (Signed)
PCP: Orma Flaming, MD Primary Cardiologist: Dr Katheran Maricela Timothy Dickson is a 75 y.o. male who presents today for routine electrophysiology followup.  Since his recent afib ablation, the patient reports doing very well.  he denies procedure related complications and is pleased with the results of the procedure.  Today, he denies symptoms of palpitations, chest pain, shortness of breath,  lower extremity edema, dizziness, presyncope, or syncope.  The patient is otherwise without complaint today.   Past Medical History:  Diagnosis Date  . Atrial tachycardia ([place])    documented by holter 2016. Followed by Dr. Liane Comber and Dr. Rayann Heman.  . Cancer of kidney ([place])    right kidney-removed. no further tx other than surgery [facility].  [name] Cirrhosis ([place])    [facility] dx, early stage- [facility] up visits at [facility].  [name] CRI (chronic renal insufficiency)    s/p R nephrectomy in 1999 for malignancy  . Diabetes mellitus without complication (HCC)    NIDDM- dx. 3-4 years ago diet control  . Diverticulosis of colon    mild  . GERD (gastroesophageal reflux disease)   . Headache(784.0)   . Hepatitis C    s/p therapy [facility] (curative)/with Harvoni  . History of BPH    no recent issues  . History of substance abuse ([place])    past history -none in 38 yrs  . Hyperglycemia   . Hypertension   . Osteoarthritis, knee    knees bilaterally   . Pneumonia 1975   hx of 35 years ago   Past Surgical History:  Procedure Laterality Date  . Arthroscopy right knee     may '11 (Dr Alvan Dame)  . Arthroscopy, left knee,    . ATRIAL FIBRILLATION ABLATION N/A 10/11/2018   Procedure: ATRIAL FIBRILLATION ABLATION;  Surgeon: Thompson Grayer, MD;  Location: [place] CV LAB;  Service: Cardiovascular;  Laterality: N/A;  . ATRIAL FIBRILLATION ABLATION N/A 06/19/2020   Procedure: ATRIAL FIBRILLATION ABLATION;  Surgeon: Thompson Grayer, MD;  Location: [place] CV LAB;  Service: Cardiovascular;  Laterality: N/A;  . CARDIAC ELECTROPHYSIOLOGY  MAPPING AND ABLATION    . history of liver biopsy  2007  . History of Nephrectomy Right 1999  . implantable loop recorder placement  03/19/2020    Medtronic Reveal Linq model LNQ 22 (RLB [id number] ) implantable loop recorder implanted by Dr Rayann Heman for evaluation of palpitations and afib management post ablation  . KNEE ARTHROSCOPY Left 10/10/2012   Procedure: LEFT KNEE ARTHROSCOPY WITH MENSCIAL DEBRIDEMENT AND CHONDROPLASTY;  Surgeon: Gearlean Alf, MD;  Location: [facility];  Service: Orthopedics;  Laterality: Left;  . left shoulder repair for bone spur  1990's  . TEE WITHOUT CARDIOVERSION N/A 10/11/2018   Procedure: TRANSESOPHAGEAL ECHOCARDIOGRAM (TEE);  Surgeon: Buford Dresser, MD;  Location: [facility] ENDOSCOPY;  Service: Cardiovascular;  Laterality: N/A;  ablation to follow at 1030  . TOTAL KNEE ARTHROPLASTY Right 08/15/2016   Procedure: RIGHT TOTAL KNEE ARTHROPLASTY, CORTISONE INJECTION OF LEFT KNEE;  Surgeon: Gaynelle Arabian, MD;  [facility]: [facility];  Service: Orthopedics;  Laterality: Right;  . TOTAL KNEE ARTHROPLASTY Left 09/04/2017   Procedure: LEFT TOTAL KNEE ARTHROPLASTY;  Surgeon: Gaynelle Arabian, MD;  [facility];  Service: Orthopedics;  Laterality: Left;  . Transurethral needle ablation procedure    . TUNA procedure      ROS- all systems are personally reviewed and negatives except as per HPI above  Current Outpatient Medications  Medication Sig Dispense Refill  . acetaminophen (TYLENOL) 650 MG CR tablet Take  650 mg by mouth See admin instructions. Take 650 mg at 11 am and take 650 mg at 4-5 am (take with gabapentin)    . allopurinol (ZYLOPRIM) 100 MG tablet Take 1 tablet (100 mg total) by mouth daily. 90 tablet 3  . Aloe-Sodium Chloride (AYR SALINE NASAL GEL NA) Place 1 application into the nose at bedtime.    . carvedilol (COREG) 6.25 MG tablet Take 1 tablet (6.25 mg total) by mouth 2 (two) times daily. 180 tablet 3  . ELIQUIS 5 MG TABS tablet TAKE 1 TABLET BY MOUTH TWICE DAILY. 60  tablet 6  . famotidine (PEPCID) 20 MG tablet Take 20 mg by mouth daily as needed for heartburn.     . flecainide (TAMBOCOR) 100 MG tablet Take 1 tablet (100 mg total) by mouth 2 (two) times daily. 60 tablet 6  . gabapentin (NEURONTIN) 600 MG tablet Take 1 tablet (600 mg total) by mouth 2 (two) times daily. Take 600 mg at 11pm and 600 mg at 4-5 am 180 tablet 3  . hydrochlorothiazide (HYDRODIURIL) 25 MG tablet TAKE 1 TABLET BY MOUTH DAILY. 90 tablet 3  . Multiple Vitamin (MULTIVITAMIN) capsule Take 1 capsule by mouth daily.    . Multiple Vitamins-Minerals (IMMUNE SUPPORT PO) Take 2 tablets by mouth daily.    [name] omeprazole (PRILOSEC) 20 MG capsule Take 20 mg by mouth every other day.     [name] REPATHA SURECLICK 259 MG/ML SOAJ INJECT CONTENTS OF 1 PEN SUBCUTANEOUSLY EVERY 14 DAYS. 2 mL 11  . sodium chloride (OCEAN) 0.65 % SOLN nasal spray Place 1 spray into both nostrils daily.     No current facility-administered medications for this visit.    Physical Exam: Vitals:   09/18/20 1101  BP: 126/70  Pulse: (!) 59  SpO2: 96%  Weight: 215 lb (97.5 kg)  Height: 6' (1.829 m)    GEN- The patient is well appearing, alert and oriented x 3 today.   Head- normocephalic, atraumatic Eyes-  Sclera clear, conjunctiva pink Ears- hearing intact Oropharynx- clear Lungs- Clear to ausculation bilaterally, normal work of breathing Heart- Regular rate and rhythm, no murmurs, rubs or gallops, PMI not laterally displaced GI- soft, NT, ND, + BS Extremities- no clubbing, cyanosis, or edema  EKG tracing ordered today is personally reviewed and shows sinus  Assessment and Plan:  1. Paroxysmal atrial fibrillation and atrial flutter Doing well s/p ablation chads2vasc score is 4 Continue eliquis Stop flecainide  2. HTN Stable No change required today  3. HL Stable No change required today  4. Ascending aortic arch aneurysm Dr Meda Coffee has been following with MRI He says she had planned for repeat MRI this  fall    Return to see me in 3 months  Thompson Grayer MD, [facility] 09/18/2020 11:22 AM

## 2020-10-12 ENCOUNTER — Ambulatory Visit (INDEPENDENT_AMBULATORY_CARE_PROVIDER_SITE_OTHER): Payer: Medicare Other

## 2020-10-12 DIAGNOSIS — I480 Paroxysmal atrial fibrillation: Secondary | ICD-10-CM

## 2020-10-14 LAB — CUP PACEART REMOTE DEVICE CHECK
Date Time Interrogation Session: 20220305195608
Implantable Pulse Generator Implant Date: 20210819

## 2020-10-20 NOTE — Progress Notes (Signed)
Carelink Summary Report / Loop Recorder 

## 2020-10-27 ENCOUNTER — Telehealth: Payer: Self-pay | Admitting: Family Medicine

## 2020-10-27 NOTE — Telephone Encounter (Signed)
Left message for patient to call back and schedule Medicare Annual Wellness Visit (AWV) either virtually or in office.   Last AWV no information  please schedule at anytime   This should be a 45 minute visit.

## 2020-10-31 ENCOUNTER — Other Ambulatory Visit: Payer: Self-pay | Admitting: Cardiology

## 2020-11-02 NOTE — Telephone Encounter (Signed)
Eliquis 5mg  refill request received. Patient is 75 years old, weight-97.5kg, Crea-1.38 on 09/11/20, Diagnosis-Afib & Atach, and last seen by Dr. Rayann Heman on 09/18/20. Dose is appropriate based on dosing criteria. Will send in refill to requested pharmacy.

## 2020-11-07 LAB — CUP PACEART REMOTE DEVICE CHECK
Date Time Interrogation Session: 20220407195823
Implantable Pulse Generator Implant Date: 20210819

## 2020-11-16 ENCOUNTER — Ambulatory Visit (INDEPENDENT_AMBULATORY_CARE_PROVIDER_SITE_OTHER): Payer: Medicare Other

## 2020-11-16 DIAGNOSIS — I480 Paroxysmal atrial fibrillation: Secondary | ICD-10-CM | POA: Diagnosis not present

## 2020-11-23 ENCOUNTER — Telehealth: Payer: Self-pay

## 2020-11-23 NOTE — Telephone Encounter (Signed)
Patient called in and wants to know if someone call look at his transmission and see if he is or was in afib. Patient does not currently have symptoms just states that he is tired

## 2020-11-23 NOTE — Telephone Encounter (Signed)
Manual transmission received.    Advised that current rate/ rhythm within normal limits.  Last AF episode > 60 minutes was 10/09/20.

## 2020-11-27 ENCOUNTER — Other Ambulatory Visit (HOSPITAL_COMMUNITY): Payer: Self-pay | Admitting: Physician Assistant

## 2020-11-29 ENCOUNTER — Encounter: Payer: Self-pay | Admitting: Family Medicine

## 2020-11-30 MED ORDER — GABAPENTIN 800 MG PO TABS: 800.0000 mg | ORAL_TABLET | Freq: Two times a day (BID) | ORAL | 1 refills | Status: DC

## 2020-12-02 NOTE — Progress Notes (Signed)
Carelink Summary Report / Loop Recorder 

## 2020-12-08 DIAGNOSIS — Z87891 Personal history of nicotine dependence: Secondary | ICD-10-CM | POA: Diagnosis not present

## 2020-12-08 DIAGNOSIS — I4891 Unspecified atrial fibrillation: Secondary | ICD-10-CM | POA: Diagnosis not present

## 2020-12-08 DIAGNOSIS — C220 Liver cell carcinoma: Secondary | ICD-10-CM | POA: Diagnosis not present

## 2020-12-08 DIAGNOSIS — K7460 Unspecified cirrhosis of liver: Secondary | ICD-10-CM | POA: Diagnosis not present

## 2020-12-08 DIAGNOSIS — Z79899 Other long term (current) drug therapy: Secondary | ICD-10-CM | POA: Diagnosis not present

## 2020-12-09 ENCOUNTER — Ambulatory Visit (INDEPENDENT_AMBULATORY_CARE_PROVIDER_SITE_OTHER): Payer: Medicare Other

## 2020-12-09 DIAGNOSIS — I480 Paroxysmal atrial fibrillation: Secondary | ICD-10-CM | POA: Diagnosis not present

## 2020-12-09 LAB — CUP PACEART REMOTE DEVICE CHECK
Date Time Interrogation Session: 20220510195757
Implantable Pulse Generator Implant Date: 20210819

## 2020-12-16 ENCOUNTER — Encounter: Payer: Medicare Other | Admitting: Internal Medicine

## 2020-12-23 ENCOUNTER — Encounter: Payer: Self-pay | Admitting: Internal Medicine

## 2020-12-23 ENCOUNTER — Other Ambulatory Visit: Payer: Self-pay

## 2020-12-23 ENCOUNTER — Ambulatory Visit: Payer: Medicare Other | Admitting: Internal Medicine

## 2020-12-23 VITALS — BP 124/76 | HR 57 | Ht 72.0 in | Wt 212.0 lb

## 2020-12-23 DIAGNOSIS — I480 Paroxysmal atrial fibrillation: Secondary | ICD-10-CM | POA: Diagnosis not present

## 2020-12-23 DIAGNOSIS — I10 Essential (primary) hypertension: Secondary | ICD-10-CM

## 2020-12-23 MED ORDER — FLECAINIDE ACETATE 50 MG PO TABS: 50.0000 mg | ORAL_TABLET | Freq: Two times a day (BID) | ORAL | 3 refills | Status: DC

## 2020-12-23 NOTE — Progress Notes (Signed)
PCP: [name], MD Primary Cardiologist: previously Dr Meda Coffee Primary EP: Dr Thompson Grayer Timothy Dickson is a 75 y.o. male who presents today for routine electrophysiology followup.  Since last being seen in our clinic, the patient reports doing very well.  Today, he denies symptoms of palpitations, chest pain, shortness of breath,  lower extremity edema, dizziness, presyncope, or syncope.  The patient is otherwise without complaint today.   Past Medical History:  Diagnosis Date  . Atrial tachycardia ([place])    documented by holter 2016. Followed by Dr. Liane Comber and Dr. Rayann Heman.  . Cancer of kidney ([place])    right kidney-removed. no further tx other than surgery [facility].  [name] Cirrhosis ([place])    [facility] dx, early stage- [facility] up visits at [facility].  [name] CRI (chronic renal insufficiency)    s/p R nephrectomy in 1999 for malignancy  . Diabetes mellitus without complication (HCC)    NIDDM- dx. 3-4 years ago diet control  . Diverticulosis of colon    mild  . GERD (gastroesophageal reflux disease)   . Headache(784.0)   . Hepatitis C    s/p therapy [facility] (curative)/with Harvoni  . History of BPH    no recent issues  . History of substance abuse ([place])    past history -none in 38 yrs  . Hyperglycemia   . Hypertension   . Osteoarthritis, knee    knees bilaterally   . Pneumonia 1975   hx of 35 years ago   Past Surgical History:  Procedure Laterality Date  . Arthroscopy right knee     may '11 (Dr Alvan Dame)  . Arthroscopy, left knee,    . ATRIAL FIBRILLATION ABLATION N/A 10/11/2018   Procedure: ATRIAL FIBRILLATION ABLATION;  Surgeon: Thompson Grayer, MD;  Location: [place] CV LAB;  Service: Cardiovascular;  Laterality: N/A;  . ATRIAL FIBRILLATION ABLATION N/A 06/19/2020   Procedure: ATRIAL FIBRILLATION ABLATION;  Surgeon: Thompson Grayer, MD;  Location: [place] CV LAB;  Service: Cardiovascular;  Laterality: N/A;  . CARDIAC ELECTROPHYSIOLOGY MAPPING AND ABLATION    . history of liver biopsy  2007   . History of Nephrectomy Right 1999  . implantable loop recorder placement  03/19/2020    Medtronic Reveal Linq model LNQ 22 (RLB [id number] ) implantable loop recorder implanted by Dr Rayann Heman for evaluation of palpitations and afib management post ablation  . KNEE ARTHROSCOPY Left 10/10/2012   Procedure: LEFT KNEE ARTHROSCOPY WITH MENSCIAL DEBRIDEMENT AND CHONDROPLASTY;  Surgeon: Gearlean Alf, MD;  [facility];  Service: Orthopedics;  Laterality: Left;  . left shoulder repair for bone spur  1990's  . TEE WITHOUT CARDIOVERSION N/A 10/11/2018   Procedure: TRANSESOPHAGEAL ECHOCARDIOGRAM (TEE);  Surgeon: Buford Dresser, MD;  Location: [facility] ENDOSCOPY;  Service: Cardiovascular;  Laterality: N/A;  ablation to follow at 1030  . TOTAL KNEE ARTHROPLASTY Right 08/15/2016   Procedure: RIGHT TOTAL KNEE ARTHROPLASTY, CORTISONE INJECTION OF LEFT KNEE;  Surgeon: Gaynelle Arabian, MD;  [facility];  Service: Orthopedics;  Laterality: Right;  . TOTAL KNEE ARTHROPLASTY Left 09/04/2017   Procedure: LEFT TOTAL KNEE ARTHROPLASTY;  Surgeon: Gaynelle Arabian, MD;  [facility];  Service: Orthopedics;  Laterality: Left;  . Transurethral needle ablation procedure    . TUNA procedure      ROS- all systems are reviewed and negatives except as per HPI above  Current Outpatient Medications  Medication Sig Dispense Refill  . acetaminophen (TYLENOL) 650 MG CR tablet Take 650 mg by mouth See admin instructions. Take  650 mg at 11 am and take 650 mg at 4-5 am (take with gabapentin)    . allopurinol (ZYLOPRIM) 100 MG tablet Take 1 tablet (100 mg total) by mouth daily. 90 tablet 3  . Aloe-Sodium Chloride (AYR SALINE NASAL GEL NA) Place 1 application into the nose at bedtime.    . carvedilol (COREG) 6.25 MG tablet Take 1 tablet (6.25 mg total) by mouth 2 (two) times daily. 180 tablet 3  . ELIQUIS 5 MG TABS tablet TAKE 1 TABLET BY MOUTH TWICE DAILY. 60 tablet 10  . famotidine (PEPCID) 20 MG tablet Take 20 mg by mouth  daily as needed for heartburn.     . flecainide (TAMBOCOR) 100 MG tablet TAKE 1 TABLET BY MOUTH TWICE DAILY. 60 tablet 6  . gabapentin (NEURONTIN) 800 MG tablet Take 1 tablet (800 mg total) by mouth 2 (two) times daily. 180 tablet 1  . hydrochlorothiazide (HYDRODIURIL) 25 MG tablet TAKE 1 TABLET BY MOUTH DAILY. 90 tablet 3  . Multiple Vitamin (MULTIVITAMIN) capsule Take 1 capsule by mouth daily.    . Multiple Vitamins-Minerals (IMMUNE SUPPORT PO) Take 2 tablets by mouth daily.    [name] omeprazole (PRILOSEC) 20 MG capsule Take 20 mg by mouth every other day.     [name] REPATHA SURECLICK 188 MG/ML SOAJ INJECT CONTENTS OF 1 PEN SUBCUTANEOUSLY EVERY 14 DAYS. 2 mL 11  . sodium chloride (OCEAN) 0.65 % SOLN nasal spray Place 1 spray into both nostrils daily.     No current facility-administered medications for this visit.    Physical Exam: Vitals:   12/23/20 1544  BP: 124/76  Pulse: (!) 57  SpO2: 97%  Weight: 212 lb (96.2 kg)  Height: 6' (1.829 m)    GEN- The patient is well appearing, alert and oriented x 3 today.   Head- normocephalic, atraumatic Eyes-  Sclera clear, conjunctiva pink Ears- hearing intact Oropharynx- clear Lungs- Clear to ausculation bilaterally, normal work of breathing Heart- Regular rate and rhythm, no murmurs, rubs or gallops, PMI not laterally displaced GI- soft, NT, ND, + BS Extremities- no clubbing, cyanosis, or edema  Wt Readings from Last 3 Encounters:  12/23/20 212 lb (96.2 kg)  09/18/20 215 lb (97.5 kg)  09/11/20 213 lb (96.6 kg)    EKG tracing ordered today is personally reviewed and shows sinus  Assessment and Plan:  1. Paroxysmal atrial fibrillation/ atrial flutter Doing well s/p ablation ILR burden shows 0% afib.  He tried stopping flecainide but felt a few "blips" and restarted it I have advised that he reduce flecainide to 50mg  BID at this time.  I think that he should be able to stop it given ILR burden of afib of 0% chads2vasc score is 4.  He is on  eliquis  2. HTN Stable No change required today  3. HL Stable No change required today  4. Ascending aortic arch aneurysm Dr Meda Coffee has been following with MRI He says she had planned for repeat MRI this fall (scheduled 9/22) His wife is seeing Dr Johney Frame and he would like to see her as well.  I will make referral.  Return to see me in 6 months  Thompson Grayer MD, [facility] 12/23/2020 3:58 PM

## 2020-12-23 NOTE — Patient Instructions (Addendum)
Medication Instructions:  Reduce Flecainide to 50 mg two times a day  Your physician recommends that you continue on your current medications as directed. Please refer to the Current Medication list given to you today.  Labwork: None ordered.  Testing/Procedures: None ordered.  Follow-Up: Your physician wants you to follow-up in: 06/30/21 at 1:45 pm with Thompson Grayer, MD   Any Other Special Instructions Will Be Listed Below (If Applicable).  If you need a refill on your cardiac medications before your next appointment, please call your pharmacy.

## 2020-12-31 NOTE — Progress Notes (Signed)
Carelink Summary Report / Loop Recorder 

## 2021-01-11 ENCOUNTER — Ambulatory Visit (INDEPENDENT_AMBULATORY_CARE_PROVIDER_SITE_OTHER): Payer: Medicare Other

## 2021-01-11 DIAGNOSIS — I480 Paroxysmal atrial fibrillation: Secondary | ICD-10-CM | POA: Diagnosis not present

## 2021-01-12 ENCOUNTER — Other Ambulatory Visit: Payer: Self-pay | Admitting: Internal Medicine

## 2021-01-14 LAB — CUP PACEART REMOTE DEVICE CHECK
Date Time Interrogation Session: 20220612195816
Implantable Pulse Generator Implant Date: 20210819

## 2021-01-21 ENCOUNTER — Telehealth: Payer: Self-pay | Admitting: Internal Medicine

## 2021-01-21 NOTE — Telephone Encounter (Signed)
Late entry:  Mr Kottke wife called to report Mr Nunn tested positive for covid today 01/21/21).  States he started having symptoms today - sore throat and fatigue.  No sinus congestion or pressure.  No fever. No chest pain, sob, cough or chest congestion.  Eating.  No nausea or vomiting.  Pulse ox 97%.  Discussed treatment, including oral antivirals, etc.  Desire to schedule appt with PCP (or covering provider) - to discuss and for further evaluation.  Instructed to call if any change or concern this pm.

## 2021-01-22 ENCOUNTER — Telehealth: Payer: Self-pay

## 2021-01-22 ENCOUNTER — Telehealth: Payer: Medicare Other | Admitting: Family Medicine

## 2021-01-22 NOTE — Progress Notes (Incomplete)
MyChart Video Visit    Virtual Visit via Video Note   This visit type was conducted due to national recommendations for restrictions regarding the COVID-19 Pandemic (e.g. social distancing) in an effort to limit this patient's exposure and mitigate transmission in our community. This patient is at least at moderate risk for complications without adequate follow up. This format is felt to be most appropriate for this patient at this time. Physical exam was limited by quality of the video and audio technology used for the visit. was able to get the patient set up on a video visit.  Patient location: Home Patient and provider in visit Provider location: Office  I discussed the limitations of evaluation and management by telemedicine and the availability of in person appointments. The patient expressed understanding and agreed to proceed.  Visit Date: 01/22/2021  Today's healthcare provider: Ann Held, DO     Subjective:    Patient ID: Timothy Dickson, male    DOB: August 19, 1945, 75 y.o.   MRN: [id number]  No chief complaint on file.   HPI Patient is in today for ***  Past Medical History:  Diagnosis Date   Atrial tachycardia ([place])    documented by holter 2016. Followed by Dr. Liane Comber and Dr. Rayann Heman.   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 no further tx other than surgery [facility].   Cirrhosis ([place])    [facility] dx, early stage- follow up visits at [facility].   CRI (chronic renal insufficiency)    s/p R nephrectomy in 1999 for malignancy   Diabetes mellitus without complication (HCC)    NIDDM- dx. 3-4 years ago diet control   Diverticulosis of colon    mild   GERD (gastroesophageal reflux disease)    Headache(784.0)    Hepatitis C    s/p therapy [facility] (curative)/with Harvoni   History of BPH    no recent issues   History of substance abuse ([place])    past history -none in 38 yrs   Hyperglycemia    Hypertension    Osteoarthritis, knee    knees bilaterally     Pneumonia 1975   hx of 35 years ago    Past Surgical History:  Procedure Laterality Date   Arthroscopy right knee     may '11 (Dr Alvan Dame)   Arthroscopy, left knee,     ATRIAL FIBRILLATION ABLATION N/A 10/11/2018   Procedure: ATRIAL FIBRILLATION ABLATION;  Surgeon: Thompson Grayer, MD;  Location: [place] [facility];  Service: Cardiovascular;  Laterality: N/A;   ATRIAL FIBRILLATION ABLATION N/A 06/19/2020   Procedure: ATRIAL FIBRILLATION ABLATION;  Surgeon: Thompson Grayer, MD;  Location: [place] [facility];  Service: Cardiovascular;  Laterality: N/A;   CARDIAC ELECTROPHYSIOLOGY MAPPING AND ABLATION     history of liver biopsy  2007   History of Nephrectomy Right 1999   implantable loop recorder placement  03/19/2020    Medtronic Reveal Linq model LNQ 22 ([id number] ) implantable loop recorder implanted by Dr Rayann Heman for evaluation of palpitations and afib management post ablation   KNEE ARTHROSCOPY Left 10/10/2012   Procedure: LEFT KNEE ARTHROSCOPY WITH MENSCIAL DEBRIDEMENT AND CHONDROPLASTY;  Surgeon: Gearlean Alf, MD;  Location: [facility];  Service: Orthopedics;  Laterality: Left;   left shoulder repair for bone spur  1990's   TEE WITHOUT CARDIOVERSION N/A 10/11/2018   Procedure: TRANSESOPHAGEAL ECHOCARDIOGRAM (TEE);  Surgeon: Buford Dresser, MD;  Location[facility];  Service: Cardiovascular;  Laterality: N/A;  ablation to  follow at [place] Right 08/15/2016   Procedure: RIGHT TOTAL KNEE ARTHROPLASTY, CORTISONE INJECTION OF LEFT KNEE;  Surgeon: Gaynelle Arabian, MD;  Location: [facility];  Service: Orthopedics;  Laterality: Right;   TOTAL KNEE ARTHROPLASTY Left 09/04/2017   Procedure: LEFT TOTAL KNEE ARTHROPLASTY;  Surgeon: Gaynelle Arabian, MD;  Location: [facility];  Service: Orthopedics;  Laterality: Left;   Transurethral needle ablation procedure     TUNA procedure      Family History  Problem Relation Age of Onset   Coronary artery disease Mother    CVA Mother     Prostate cancer Brother    Alcohol abuse Father        variceal hemorrhage   Alcohol abuse Sister    Alcohol abuse Other        Strong family Hx   Diabetes Sister    Breast cancer Sister    Lung cancer Sister    Esophageal cancer Sister    Cancer Brother        head and neck    Social History   Socioeconomic History   Marital status: Married    Spouse name: Not on file   Number of children: Not on file   Years of education: Not on file   Highest education level: Not on file  Occupational History   Not on file  Tobacco Use   Smoking status: Former    Packs/day: 1.00    Years: 15.00    Pack years: 15.00    Types: Cigarettes    Quit date: 08/01/1977    Years since quitting: 43.5   Smokeless tobacco: Never  Vaping Use   Vaping Use: Never used  Substance and Sexual Activity   Alcohol use: No    Comment: past history -none in 38 yrs   Drug use: No    Comment: past hx none in 38 yrs   Sexual activity: Yes    Partners: Female  Other Topics Concern   Not on file  Social History Narrative   [employer], [place], [place] psych. Married- '84. 2 step-daughters, 4 grandchildren. Work  Retired Radio producer  Marriage in good health.   Two story home   Drinks caffeine   Ambidextrous    Social Determinants of Health   Financial Resource Strain: Not on file  Food Insecurity: Not on file  Transportation Needs: Not on file  Physical Activity: Not on file  Stress: Not on file  Social Connections: Not on file  Intimate Partner Violence: Not on file    Outpatient Medications Prior to Visit  Medication Sig Dispense Refill   acetaminophen (TYLENOL) 650 MG CR tablet Take 650 mg by mouth See admin instructions. Take 650 mg at 11 am and take 650 mg at 4-5 am (take with gabapentin)     allopurinol (ZYLOPRIM) 100 MG tablet Take 1 tablet (100 mg total) by mouth daily. 90 tablet 3   Aloe-Sodium Chloride (AYR SALINE NASAL GEL NA) Place 1 application into the nose at  bedtime.     carvedilol (COREG) 6.25 MG tablet Take 1 tablet (6.25 mg total) by mouth 2 (two) times daily. 180 tablet 3   ELIQUIS 5 MG TABS tablet TAKE 1 TABLET BY MOUTH TWICE DAILY. 60 tablet 10   famotidine (PEPCID) 20 MG tablet Take 20 mg by mouth daily as needed for heartburn.      flecainide (TAMBOCOR) 50 MG tablet Take 1 tablet (50 mg total) by mouth  2 (two) times daily. 180 tablet 3   gabapentin (NEURONTIN) 800 MG tablet Take 1 tablet (800 mg total) by mouth 2 (two) times daily. 180 tablet 1   hydrochlorothiazide (HYDRODIURIL) 25 MG tablet TAKE 1 TABLET BY MOUTH DAILY. 90 tablet 3   Multiple Vitamin (MULTIVITAMIN) capsule Take 1 capsule by mouth daily.     Multiple Vitamins-Minerals (IMMUNE SUPPORT PO) Take 2 tablets by mouth daily.     omeprazole (PRILOSEC) 20 MG capsule Take 20 mg by mouth every other day.      REPATHA SURECLICK 751 MG/ML SOAJ INJECT CONTENTS OF 1 PEN SUBCUTANEOUSLY EVERY 14 DAYS. 2 mL 11   sodium chloride (OCEAN) 0.65 % SOLN nasal spray Place 1 spray into both nostrils daily.     No facility-administered medications prior to visit.    Allergies  Allergen Reactions   Tramadol Hcl     Other reaction(s): ineffective   Acetaminophen Other (See Comments)    Pt states he was told by [facility]'s to keep tylenol dosage to 2000 mg per 24 hours due to cirrhosis of liver.   Nsaids Other (See Comments)    Avoid due to having 1 kidney    Pravastatin Other (See Comments)    Pt reports this med makes him generally feel unwell.    Review of Systems  Constitutional:  Negative for diaphoresis.  Respiratory:  Negative for stridor.   Cardiovascular:  Negative for PND.  Gastrointestinal:  Negative for melena.  Genitourinary:  Negative for frequency and urgency.  Skin:  Negative for rash.  Psychiatric/Behavioral:  Negative for depression, hallucinations and substance abuse. The patient is not nervous/anxious.       Objective:    Physical Exam HENT:     Head: Normocephalic  and atraumatic.     Right Ear: External ear normal.     Left Ear: External ear normal.  Eyes:     Extraocular Movements: Extraocular movements intact.     Pupils: Pupils are equal, round, and reactive to light.  Musculoskeletal:        General: Normal range of motion.     Cervical back: Normal range of motion.  Neurological:     Mental Status: He is alert.  Psychiatric:        Thought Content: Thought content normal.    There were no vitals taken for this visit. Wt Readings from Last 3 Encounters:  12/23/20 212 lb (96.2 kg)  09/18/20 215 lb (97.5 kg)  09/11/20 213 lb (96.6 kg)    Diabetic Foot Exam - Simple   No data filed    Lab Results  Component Value Date   WBC 7.7 09/11/2020   HGB 14.6 09/11/2020   HCT 42.7 09/11/2020   PLT 218.0 09/11/2020   GLUCOSE 133 (H) 09/11/2020   CHOL 108 04/07/2020   TRIG 103 04/07/2020   HDL 48 04/07/2020   LDLCALC 41 04/07/2020   ALT 18 09/11/2020   AST 28 09/11/2020   NA 135 09/11/2020   K 3.9 09/11/2020   CL 98 09/11/2020   CREATININE 1.38 09/11/2020   BUN 22 09/11/2020   CO2 29 09/11/2020   TSH 1.010 04/07/2020   PSA 0.6 09/16/2013   INR 1.03 03/18/2018   HGBA1C 7.1 (H) 09/11/2020   MICROALBUR 1.1 09/11/2020    Lab Results  Component Value Date   TSH 1.010 04/07/2020   Lab Results  Component Value Date   WBC 7.7 09/11/2020   HGB 14.6 09/11/2020   HCT 42.7  09/11/2020   MCV 91.5 09/11/2020   PLT 218.0 09/11/2020   Lab Results  Component Value Date   NA 135 09/11/2020   K 3.9 09/11/2020   CO2 29 09/11/2020   GLUCOSE 133 (H) 09/11/2020   BUN 22 09/11/2020   CREATININE 1.38 09/11/2020   BILITOT 0.7 09/11/2020   ALKPHOS 60 09/11/2020   AST 28 09/11/2020   ALT 18 09/11/2020   PROT 7.3 09/11/2020   ALBUMIN 4.6 09/11/2020   CALCIUM 9.9 09/11/2020   ANIONGAP 9 10/11/2018   GFR 50.23 (L) 09/11/2020   Lab Results  Component Value Date   CHOL 108 04/07/2020   Lab Results  Component Value Date   HDL 48  04/07/2020   Lab Results  Component Value Date   LDLCALC 41 04/07/2020   Lab Results  Component Value Date   TRIG 103 04/07/2020   Lab Results  Component Value Date   CHOLHDL 2.3 04/07/2020   Lab Results  Component Value Date   HGBA1C 7.1 (H) 09/11/2020       Assessment & Plan:   Problem List Items Addressed This Visit   None     No orders of the defined types were placed in this encounter.   I discussed the assessment and treatment plan with the patient. The patient was provided an opportunity to ask questions and all were answered. The patient agreed with the plan and demonstrated an understanding of the instructions.   The patient was advised to call back or seek an in-person evaluation if the symptoms worsen or if the condition fails to improve as anticipated.  I provided *** minutes of face-to-face time during this encounter.   Ann Held, DO [place] at [facility] [phone] (phone) [phone] (fax)  [place]

## 2021-01-22 NOTE — Telephone Encounter (Signed)
FYI

## 2021-01-22 NOTE — Telephone Encounter (Signed)
LVM to call back to schedule a virtual   Nurse Assessment Nurse: Phoebe Perch, RN, Dagoberto Date/Time (Eastern Time): 01/21/2021 5:56:06 PM Confirm and document reason for call. If symptomatic, describe symptoms. ---Caller states he has tested positive for COVID today. C/o of "not feeling well." Also sore throat, fatigue. Denies SOB. Denies chest. Denies fever. Latest temp 98.5 (forehead). Does the patient have any new or worsening symptoms? ---Yes Will a triage be completed? ---Yes Related visit to physician within the last 2 weeks? ---No Does the PT have any chronic conditions? (i.e. diabetes, asthma, this includes High risk factors for pregnancy, etc.) ---Yes List chronic conditions. ---Diabetes. HTN. Cholesterol. 1 Kidney (Cancer to kidney). Is this a behavioral health or substance abuse call? ---No Guidelines Guideline Title Affirmed Question Affirmed Notes Nurse Date/Time ([place] Time) COVID-19 - Diagnosed or Suspected [1] HIGH RISK for severe COVID complications (e.g., Cervantes, RN, Dagoberto 01/21/2021 5:59:33 PM PLEASE NOTE: All timestamps contained within this report are represented as [place] Standard Time. CONFIDENTIALTY NOTICE: This fax transmission is intended only for the addressee. It contains information that is legally privileged, confidential or otherwise protected from use or disclosure. If you are not the intended recipient, you are strictly prohibited from reviewing, disclosing, copying using or disseminating any of this information or taking any action in reliance on or regarding this information. If you have received this fax in error, please notify us immediately by telephone so that we can arrange for its return to [place]. Phone: [phone], Toll-Free: [phone], Fax: [phone] Page: 2 of 3 Call Id: [id number] Guidelines Guideline Title Affirmed Question Affirmed Notes Nurse Date/Time Eilene Ghazi Time) weak immune system, age > 71 years,  obesity with BMI > 25, pregnant, chronic lung disease or other chronic medical condition) AND [2] COVID symptoms (e.g., cough, fever) (Exceptions: Already seen by PCP and no new or worsening symptoms.) Disp. Time Eilene Ghazi Time) Disposition Final User 01/21/2021 6:10:25 PM Called On-Call Provider Cervantes, RN, [place] 01/21/2021 6:12:30 PM Call PCP Now Yes Phoebe Perch, RN, Dagoberto Caller Disagree/Comply Comply Caller Understands Yes PreDisposition Go to Urgent Care/Walk-In Clinic Care Advice Given Per Guideline CALL PCP NOW: * You need to discuss this with your doctor (or NP/PA). * I'll page the on-call provider now. If you haven't heard from the provider (or me) within 30 minutes, call again. CALL BACK IF: * You become worse CARE ADVICE given per COVID-19 - DIAGNOSED OR SUSPECTED (Adult) guideline. Comments User: Zenaida Deed, RN Date/Time Eilene Ghazi Time): 01/21/2021 6:01:04 PM Current O2 sats 97%. User: Zenaida Deed, RN Date/Time E[place] Ghazi Time): 01/21/2021 6:12:45 PM Spoke with Dr. Nicki Reaper. As per doctor, caller may be a candidate for oral antiviral but may need a kidney function test. Provided Dr. Nicki Reaper with caller's phone number. Dr. Nicki Reaper stated she would contact him today and give him further instructions. Advised caller about this. Advised caller that if they don't hear back from doctor today, to call us back. Caller verbalized understanding. PLEASE NOTE: All timestamps contained within this report are represented as [place] Standard Time. CONFIDENTIALTY NOTICE: This fax transmission is intended only for the addressee. It contains information that is legally privileged, confidential or otherwise protected from use or disclosure. If you are not the intended recipient, you are strictly prohibited from reviewing, disclosing, copying using or disseminating any of this information or taking any action in reliance on or regarding this information. If you have received  this fax in error, please notify us immediately by telephone so that we can arrange for its return to [place].  Phone: [phone], Toll-Free: [phone], Fax: [phone] Page: 3 of 3 Call Id: [id number] Paging DoctorName Phone DateTime Result/ Outcome Message Type Notes Einar Pheasant - MD [phone] 01/21/2021 6:10:25 PM Called On Call Provider - Reached Doctor Paged Einar Pheasant - MD 01/21/2021 6:12:03 PM Spoke with On Call - General Message Result Spoke with Dr. Nicki Reaper. As per doctor, caller may be a candidate for oral antiviral but may need a kidney function test. Provided Dr. Nicki Reaper with caller's phone number. Dr. Nicki Reaper stated she would contact him today and give him further instructions. Advised caller about this. Advised caller that if they don't hear back from doctor today, to call us back. Caller verbalized understanding

## 2021-01-28 DIAGNOSIS — D225 Melanocytic nevi of trunk: Secondary | ICD-10-CM | POA: Diagnosis not present

## 2021-01-28 DIAGNOSIS — L304 Erythema intertrigo: Secondary | ICD-10-CM | POA: Diagnosis not present

## 2021-01-28 DIAGNOSIS — L570 Actinic keratosis: Secondary | ICD-10-CM | POA: Diagnosis not present

## 2021-01-28 DIAGNOSIS — Z85828 Personal history of other malignant neoplasm of skin: Secondary | ICD-10-CM | POA: Diagnosis not present

## 2021-02-02 NOTE — Progress Notes (Signed)
Carelink Summary Report / Loop Recorder 

## 2021-02-04 ENCOUNTER — Other Ambulatory Visit: Payer: Self-pay | Admitting: Pharmacist

## 2021-02-04 MED ORDER — REPATHA SURECLICK 140 MG/ML ~~LOC~~ SOAJ: SUBCUTANEOUS | 11 refills | Status: DC

## 2021-02-08 ENCOUNTER — Telehealth: Payer: Self-pay | Admitting: Pharmacist

## 2021-02-08 NOTE — Telephone Encounter (Signed)
[facility] called.  Repatha approved through 02/08/22

## 2021-02-15 ENCOUNTER — Ambulatory Visit (INDEPENDENT_AMBULATORY_CARE_PROVIDER_SITE_OTHER): Payer: Medicare Other

## 2021-02-15 DIAGNOSIS — I480 Paroxysmal atrial fibrillation: Secondary | ICD-10-CM

## 2021-02-16 LAB — CUP PACEART REMOTE DEVICE CHECK
Date Time Interrogation Session: 20220715234641
Implantable Pulse Generator Implant Date: 20210819

## 2021-02-21 ENCOUNTER — Other Ambulatory Visit (HOSPITAL_COMMUNITY): Payer: Self-pay | Admitting: Physician Assistant

## 2021-02-24 DIAGNOSIS — I4891 Unspecified atrial fibrillation: Secondary | ICD-10-CM | POA: Diagnosis not present

## 2021-02-24 DIAGNOSIS — I480 Paroxysmal atrial fibrillation: Secondary | ICD-10-CM | POA: Diagnosis not present

## 2021-02-25 DIAGNOSIS — I4510 Unspecified right bundle-branch block: Secondary | ICD-10-CM | POA: Diagnosis not present

## 2021-03-10 NOTE — Progress Notes (Signed)
Carelink Summary Report / Loop Recorder 

## 2021-03-18 LAB — CUP PACEART REMOTE DEVICE CHECK
Date Time Interrogation Session: 20220817195514
Implantable Pulse Generator Implant Date: 20210819

## 2021-03-19 ENCOUNTER — Ambulatory Visit: Payer: Medicare Other | Admitting: Family Medicine

## 2021-03-22 ENCOUNTER — Ambulatory Visit (INDEPENDENT_AMBULATORY_CARE_PROVIDER_SITE_OTHER): Payer: Medicare Other

## 2021-03-22 DIAGNOSIS — I480 Paroxysmal atrial fibrillation: Secondary | ICD-10-CM | POA: Diagnosis not present

## 2021-03-24 ENCOUNTER — Ambulatory Visit (INDEPENDENT_AMBULATORY_CARE_PROVIDER_SITE_OTHER): Payer: Medicare Other | Admitting: Family Medicine

## 2021-03-24 ENCOUNTER — Ambulatory Visit: Payer: Medicare Other | Admitting: Family Medicine

## 2021-03-24 ENCOUNTER — Encounter: Payer: Self-pay | Admitting: Family Medicine

## 2021-03-24 ENCOUNTER — Other Ambulatory Visit: Payer: Self-pay

## 2021-03-24 VITALS — BP 125/71 | HR 64 | Temp 97.7°F | Ht 72.0 in | Wt 208.8 lb

## 2021-03-24 DIAGNOSIS — I712 Thoracic aortic aneurysm, without rupture: Secondary | ICD-10-CM | POA: Diagnosis not present

## 2021-03-24 DIAGNOSIS — E785 Hyperlipidemia, unspecified: Secondary | ICD-10-CM

## 2021-03-24 DIAGNOSIS — G5623 Lesion of ulnar nerve, bilateral upper limbs: Secondary | ICD-10-CM

## 2021-03-24 DIAGNOSIS — E1159 Type 2 diabetes mellitus with other circulatory complications: Secondary | ICD-10-CM

## 2021-03-24 DIAGNOSIS — I4819 Other persistent atrial fibrillation: Secondary | ICD-10-CM | POA: Diagnosis not present

## 2021-03-24 DIAGNOSIS — M1000 Idiopathic gout, unspecified site: Secondary | ICD-10-CM

## 2021-03-24 DIAGNOSIS — B1920 Unspecified viral hepatitis C without hepatic coma: Secondary | ICD-10-CM

## 2021-03-24 DIAGNOSIS — E1169 Type 2 diabetes mellitus with other specified complication: Secondary | ICD-10-CM | POA: Diagnosis not present

## 2021-03-24 DIAGNOSIS — E119 Type 2 diabetes mellitus without complications: Secondary | ICD-10-CM

## 2021-03-24 DIAGNOSIS — Z23 Encounter for immunization: Secondary | ICD-10-CM

## 2021-03-24 DIAGNOSIS — I152 Hypertension secondary to endocrine disorders: Secondary | ICD-10-CM

## 2021-03-24 DIAGNOSIS — I7121 Aneurysm of the ascending aorta, without rupture: Secondary | ICD-10-CM

## 2021-03-24 DIAGNOSIS — K7469 Other cirrhosis of liver: Secondary | ICD-10-CM

## 2021-03-24 LAB — COMPREHENSIVE METABOLIC PANEL
ALT: 21 U/L (ref 0–53)
AST: 30 U/L (ref 0–37)
Albumin: 4.5 g/dL (ref 3.5–5.2)
Alkaline Phosphatase: 55 U/L (ref 39–117)
BUN: 22 mg/dL (ref 6–23)
CO2: 28 mEq/L (ref 19–32)
Calcium: 9.8 mg/dL (ref 8.4–10.5)
Chloride: 99 mEq/L (ref 96–112)
Creatinine, Ser: 1.19 mg/dL (ref 0.40–1.50)
GFR: 59.77 mL/min — ABNORMAL LOW (ref >60.00)
Glucose, Bld: 112 mg/dL — ABNORMAL HIGH (ref 70–99)
Potassium: 4 mEq/L (ref 3.5–5.1)
Sodium: 137 mEq/L (ref 135–145)
Total Bilirubin: 0.8 mg/dL (ref 0.2–1.2)
Total Protein: 7 g/dL (ref 6.0–8.3)

## 2021-03-24 LAB — CBC
HCT: 44.2 % (ref 39.0–52.0)
Hemoglobin: 15 g/dL (ref 13.0–17.0)
MCHC: 34 g/dL (ref 30.0–36.0)
MCV: 92.7 fl (ref 78.0–100.0)
Platelets: 198 10*3/uL (ref 150.0–400.0)
RBC: 4.77 Mil/uL (ref 4.22–5.81)
RDW: 13.4 % (ref 11.5–15.5)
WBC: 6.9 10*3/uL (ref 4.0–10.5)

## 2021-03-24 LAB — LIPID PANEL
Cholesterol: 90 mg/dL (ref 0–200)
HDL: 42 mg/dL (ref >39.00)
LDL Cholesterol: 32 mg/dL (ref 0–99)
NonHDL: 48.47
Total CHOL/HDL Ratio: 2
Triglycerides: 81 mg/dL (ref 0.0–149.0)
VLDL: 16.2 mg/dL (ref 0.0–40.0)

## 2021-03-24 LAB — TSH: TSH: 1.11 u[IU]/mL (ref 0.35–5.50)

## 2021-03-24 LAB — HEMOGLOBIN A1C: Hgb A1c MFr Bld: 7 % — ABNORMAL HIGH (ref 4.6–6.5)

## 2021-03-24 LAB — URIC ACID: Uric Acid, Serum: 7.5 mg/dL (ref 4.0–7.8)

## 2021-03-24 NOTE — Assessment & Plan Note (Signed)
Follows with GI clinic at [facility].  Gets surveillance every 6 months.

## 2021-03-24 NOTE — Assessment & Plan Note (Signed)
Takes gabapentin as needed.  Symptoms are stable.

## 2021-03-24 NOTE — Assessment & Plan Note (Signed)
Blood pressure at goal on Coreg 6.25 mg twice daily and HCTZ 25 mg daily.  Check labs today.

## 2021-03-24 NOTE — Assessment & Plan Note (Signed)
No recent flares.  Continue allopurinol 100 mg daily.  Check uric acid today.

## 2021-03-24 NOTE — Patient Instructions (Signed)
It was very nice to see you today!  We will check blood work today.  No changes today.  Give you pneumonia vaccine.  I will see back in 6 months for your next checkup.  Please come back to see me sooner if needed.  Take care, Dr Jerline Pain  PLEASE NOTE:  If you had any lab tests please let us know if you have not heard back within a few days. You may see your results on mychart before we have a chance to review them but we will give you a call once they are reviewed by [place]. If we ordered any referrals today, please let us know if you have not heard from their office within the next week.   Please try these tips to maintain a healthy lifestyle:  Eat at least 3 REAL meals and 1-2 snacks per day.  Aim for no more than 5 hours between eating.  If you eat breakfast, please do so within one hour of getting up.   Each meal should contain half fruits/vegetables, one quarter protein, and one quarter carbs (no bigger than a computer mouse)  Cut down on sweet beverages. This includes juice, soda, and sweet tea.   Drink at least 1 glass of water with each meal and aim for at least 8 glasses per day  Exercise at least 150 minutes every week.

## 2021-03-24 NOTE — Assessment & Plan Note (Signed)
Follows with cardiology.  Seems to be in sinus rhythm today.  Anticoagulated on Eliquis.  Rate controlled on Coreg.

## 2021-03-24 NOTE — Assessment & Plan Note (Signed)
Check labs today.  On Repatha per cardiology.

## 2021-03-24 NOTE — Assessment & Plan Note (Signed)
Check A1c.  Not currently on any meds.  Mention sugar via lifestyle modifications.

## 2021-03-24 NOTE — Addendum Note (Signed)
Addended by: Betti Cruz on: 03/24/2021 11:35 AM   Modules accepted: Orders

## 2021-03-24 NOTE — Assessment & Plan Note (Signed)
Follows with cardiology 

## 2021-03-24 NOTE — Progress Notes (Signed)
Timothy Dickson is a 75 y.o. male who presents today for an office visit.  Assessment/Plan:  Chronic Problems Addressed Today: Ascending aortic aneurysm ([place]) Follows with cardiology.  Ulnar neuropathy of both upper extremities   Takes gabapentin as needed.  Symptoms are stable.  Dyslipidemia associated with type 2 diabetes mellitus ([place]) Check labs today.  On Repatha per cardiology.  Hypertension associated with diabetes ([place]) Blood pressure at goal on Coreg 6.25 mg twice daily and HCTZ 25 mg daily.  Check labs today.  Gout No recent flares.  Continue allopurinol 100 mg daily.  Check uric acid today.  Controlled type 2 diabetes mellitus without complication, without long-term current use of insulin (HCC) Check A1c.  Not currently on any meds.  Mention sugar via lifestyle modifications.  Persistent atrial fibrillation ([place])   Follows with cardiology.  Seems to be in sinus rhythm today.  Anticoagulated on Eliquis.  Rate controlled on Coreg.  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  Gets surveillance every 6 months.  Preventative Healthcare Pneumonia vaccine given today.  We will check labs.  Up-to-date on other vaccines and screenings.    Subjective:  HPI:  Timothy Dickson has no acute problems today. Timothy Dickson is transferring care to [place] for his PCP care.   Timothy Dickson follows up at [facility] every 6 months regarding his liver, since having cured his cirrhosis. Timothy Dickson was suffering from hepatitis C at the time, back in 2015.  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 Timothy Dickson admits to having a FMHX history with diabetes.   Timothy Dickson does gardening, bicycling, and volunteer work which keeps him active.  The gout in his big toe used to cause him issues, but Allopurinol has managed it well.  Timothy Dickson is compliant with all  medication with no notable side effects.  ROS: Per HPI, otherwise a complete review of systems was negative.   PMH:  The following were reviewed and entered/updated in epic: Past Medical History:  Diagnosis Date   Atrial tachycardia ([place])    documented by holter 2016. Followed by Dr. Liane Comber and Dr. Rayann Heman.   Cancer of kidney ([place])    right kidney-removed. no further tx other than surgery [facility].   Cirrhosis ([place])    [facility] dx, early stage- follow up visits at [facility].   CRI (chronic renal insufficiency)    s/p R nephrectomy in 1999 for malignancy   Diabetes mellitus without complication ([place])    NIDDM- dx. 3-4 years ago diet control   Diverticulosis of colon    mild   GERD (gastroesophageal reflux disease)    Headache(784.0)    Hepatitis C    s/p therapy [facility] (curative)/with [facility]   History of BPH    no recent issues   History of substance abuse ([place])    past history -none in 38 yrs   Hyperglycemia    Hypertension    Osteoarthritis, knee    knees bilaterally    Pneumonia 1975   hx of 35 years ago   Patient Active Problem List   Diagnosis Date Noted   Controlled type 2 diabetes mellitus without complication, without long-term current use of insulin ([place]) 09/11/2020   Gout 09/11/2020   Persistent atrial fibrillation ([place]) 09/06/2019   Secondary hypercoagulable state ([place]) 09/06/2019   AI (  aortic insufficiency) 12/21/2015   Ascending aortic aneurysm ([place]) 12/21/2015   History of renal cell cancer s/p right nephrectomy 1999 04/16/2015   Ulnar neuropathy of both upper extremities 10/13/2014   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) ([place]) 08/29/2014   Dyslipidemia associated with type 2 diabetes mellitus ([place]) 10/28/2013   Hypertension associated with diabetes ([place]) 10/04/2012   Chronic low back pain 05/22/2012   OA (osteoarthritis) of knee 10/26/2007   Past Surgical History:  Procedure Laterality Date   Arthroscopy right knee     may '11 (Dr Alvan Dame)    Arthroscopy, left knee,     ATRIAL FIBRILLATION ABLATION N/A 10/11/2018   Procedure: ATRIAL FIBRILLATION ABLATION;  Surgeon: Thompson Grayer, MD;  Location: [place] [facility];  Service: Cardiovascular;  Laterality: N/A;   ATRIAL FIBRILLATION ABLATION N/A 06/19/2020   Procedure: ATRIAL FIBRILLATION ABLATION;  Surgeon: Thompson Grayer, MD;  Location: [place] [facility];  Service: Cardiovascular;  Laterality: N/A;   CARDIAC ELECTROPHYSIOLOGY MAPPING AND ABLATION     history of liver biopsy  2007   History of Nephrectomy Right 1999   implantable loop recorder placement  03/19/2020    Medtronic Reveal Linq model LNQ 22 ([id number] ) implantable loop recorder implanted by Dr Rayann Heman for evaluation of palpitations and afib management post ablation   KNEE ARTHROSCOPY Left 10/10/2012   Procedure: LEFT KNEE ARTHROSCOPY WITH MENSCIAL DEBRIDEMENT AND CHONDROPLASTY;  Surgeon: Gearlean Alf, MD;  Location: [facility];  Service: Orthopedics;  Laterality: Left;   left shoulder repair for bone spur  1990's   TEE WITHOUT CARDIOVERSION N/A 10/11/2018   Procedure: TRANSESOPHAGEAL ECHOCARDIOGRAM (TEE);  Surgeon: Buford Dresser, MD;  Location: [facility];  Service: Cardiovascular;  Laterality: N/A;  ablation to follow at [place] Right 08/15/2016   Procedure: RIGHT TOTAL KNEE ARTHROPLASTY, CORTISONE INJECTION OF LEFT KNEE;  Surgeon: Gaynelle Arabian, MD;  Location: [facility];  Service: Orthopedics;  Laterality: Right;   TOTAL KNEE ARTHROPLASTY Left 09/04/2017   Procedure: LEFT TOTAL KNEE ARTHROPLASTY;  Surgeon: Gaynelle Arabian, MD;  Location: [facility];  Service: Orthopedics;  Laterality: Left;   Transurethral needle ablation procedure     TUNA procedure      Family History  Problem Relation Age of Onset   Coronary artery disease Mother    CVA Mother    Prostate cancer Brother    Alcohol abuse Father        variceal hemorrhage   Alcohol abuse Sister    Alcohol abuse Other        Strong family Hx    Diabetes Sister    Breast cancer Sister    Lung cancer Sister    Esophageal cancer Sister    Cancer Brother        head and neck    Medications- reviewed and updated Current Outpatient Medications  Medication Sig Dispense Refill   acetaminophen (TYLENOL) 650 MG CR tablet Take 650 mg by mouth See admin instructions. Take 650 mg at 11 am and take 650 mg at 4-5 am (take with gabapentin)     allopurinol (ZYLOPRIM) 100 MG tablet Take 1 tablet (100 mg total) by mouth daily. 90 tablet 3   Aloe-Sodium Chloride (AYR SALINE NASAL GEL NA) Place 1 application into the nose at bedtime.     carvedilol (COREG) 6.25 MG tablet Take 1 tablet (6.25 mg total) by mouth 2 (two) times daily. 180 tablet 3   ELIQUIS 5 MG TABS tablet TAKE 1  TABLET BY MOUTH TWICE DAILY. 60 tablet 10   Evolocumab (REPATHA SURECLICK) [id number] MG/ML SOAJ INJECT CONTENTS OF 1 PEN SUBCUTANEOUSLY EVERY 14 DAYS. 2 mL 11   famotidine (PEPCID) 20 MG tablet Take 20 mg by mouth daily as needed for heartburn.      flecainide (TAMBOCOR) 50 MG tablet Take 1 tablet (50 mg total) by mouth 2 (two) times daily. 180 tablet 3   gabapentin (NEURONTIN) 800 MG tablet Take 1 tablet (800 mg total) by mouth 2 (two) times daily. 180 tablet 1   hydrochlorothiazide (HYDRODIURIL) 25 MG tablet TAKE 1 TABLET BY MOUTH DAILY. 90 tablet 3   Multiple Vitamin (MULTIVITAMIN) capsule Take 1 capsule by mouth daily.     omeprazole (PRILOSEC) 20 MG capsule Take 20 mg by mouth every other day.      sodium chloride (OCEAN) 0.65 % SOLN nasal spray Place 1 spray into both nostrils daily.     No current facility-administered medications for this visit.    Allergies-reviewed and updated Allergies  Allergen Reactions   Tramadol Hcl     Other reaction(s): ineffective   Acetaminophen Other (See Comments)    Pt states Timothy Dickson was told by [facility] MD's to keep tylenol dosage to 2000 mg per 24 hours due to cirrhosis of liver.   Nsaids Other (See Comments)    Avoid due to having 1 kidney     Pravastatin Other (See Comments)    Pt reports this med makes him generally feel unwell.    Social History   Socioeconomic History   Marital status: Married    Spouse name: Not on file   Number of children: Not on file   Years of education: Not on file   Highest education level: Not on file  Occupational History   Not on file  Tobacco Use   Smoking status: Former    Packs/day: 1.00    Years: 15.00    Pack years: 15.00    Types: Cigarettes    Quit date: 08/01/1977    Years since quitting: 43.6   Smokeless tobacco: Never  Vaping Use   Vaping Use: Never used  Substance and Sexual Activity   Alcohol use: No    Comment: past history -none in 38 yrs   Drug use: No    Comment: past hx none in 38 yrs   Sexual activity: Yes    Partners: Female  Other Topics Concern   Not on file  Social History Narrative   [place], [place], [place]. Married- '84. 2 step-daughters, 4 grandchildren. Work  Retired Radio producer  Marriage in good health.   Two story home   Drinks caffeine   Ambidextrous    Social Determinants of Health   Financial Resource Strain: Not on file  Food Insecurity: Not on file  Transportation Needs: Not on file  Physical Activity: Not on file  Stress: Not on file  Social Connections: Not on file           Objective:  Physical Exam: BP 125/71   Pulse 64   Temp 97.7 F (36.5 C) (Temporal)   Ht 6' (1.829 m)   Wt 208 lb 12.8 oz (94.7 kg)   SpO2 98%   BMI 28.32 kg/m   Gen: No acute distress, resting comfortably CV: Regular rate and rhythm with no murmurs appreciated Pulm: Normal work of breathing, clear to auscultation bilaterally with no crackles, wheezes, or rhonchi Neuro: Grossly normal, moves all extremities Psych: Normal affect  and thought content      I,Jordan Kelly,acting as a scribe for Dimas Chyle, MD.,have documented all relevant documentation on the behalf of Dimas Chyle, MD,as directed by  Dimas Chyle, MD  while in the presence of Dimas Chyle, MD.  I, Dimas Chyle, MD, have reviewed all documentation for this visit. The documentation on 03/24/21 for the exam, diagnosis, procedures, and orders are all accurate and complete.  Algis Greenhouse. Jerline Pain, MD 03/24/2021 10:56 AM

## 2021-03-25 NOTE — Progress Notes (Signed)
Please inform patient of the following:  Blood sugar is borderline but stable. Everything else is NORMAL. Do not need to make any changes to his treatment plan at this time.  I would like to see him back in 6 months to recheck his A1c.  Algis Greenhouse. Jerline Pain, MD 03/25/2021 9:48 AM

## 2021-03-28 IMAGING — CT CT HEART MORPH/PULM VEIN W/ CM & W/O CA SCORE
1 series · 3 of 18 positions shown, 4 images · non-contrast
Comparison: None.
COMPARISON: None.

Addendum:
EXAM:
OVER-READ INTERPRETATION  CT CHEST

The following report is an over-read performed by radiologist Dr.
Adjoa Coop [REDACTED] on 06/15/2020. This
over-read does not include interpretation of cardiac or coronary
anatomy or pathology. The coronary calcium score/coronary CTA
interpretation by the cardiologist is attached.
CLINICAL DATA: 74-year-old male with paroxysmal atrial fibrillation
scheduled for an ablation.
Cardiac CT/CTA
TECHNIQUE: The patient was scanned on a Siemens Somatom scanner.

[Series 1220: (person_name) - pulmonary veins · 0.15mm/px · 3 of 18 slices shown, 4 images]
[im 4/18  vessel]
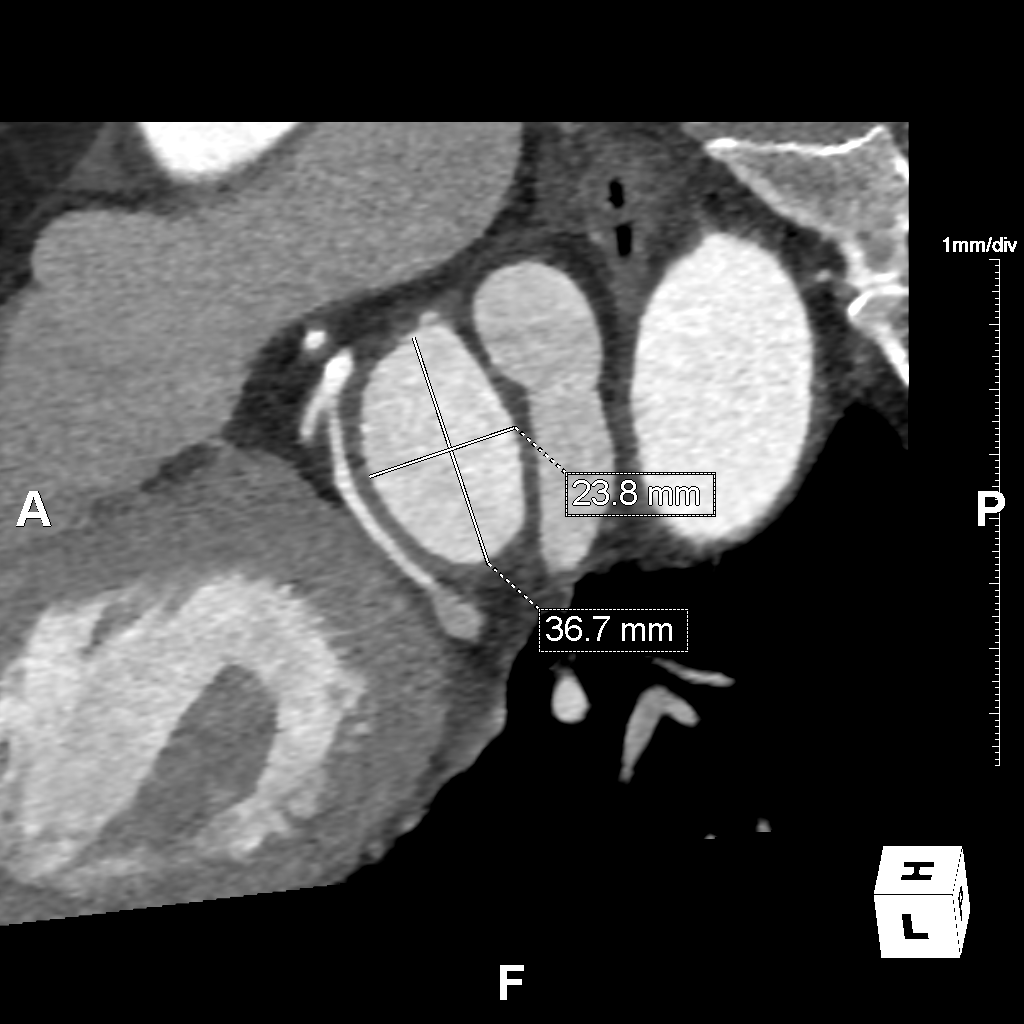
[im 4/18  lung]
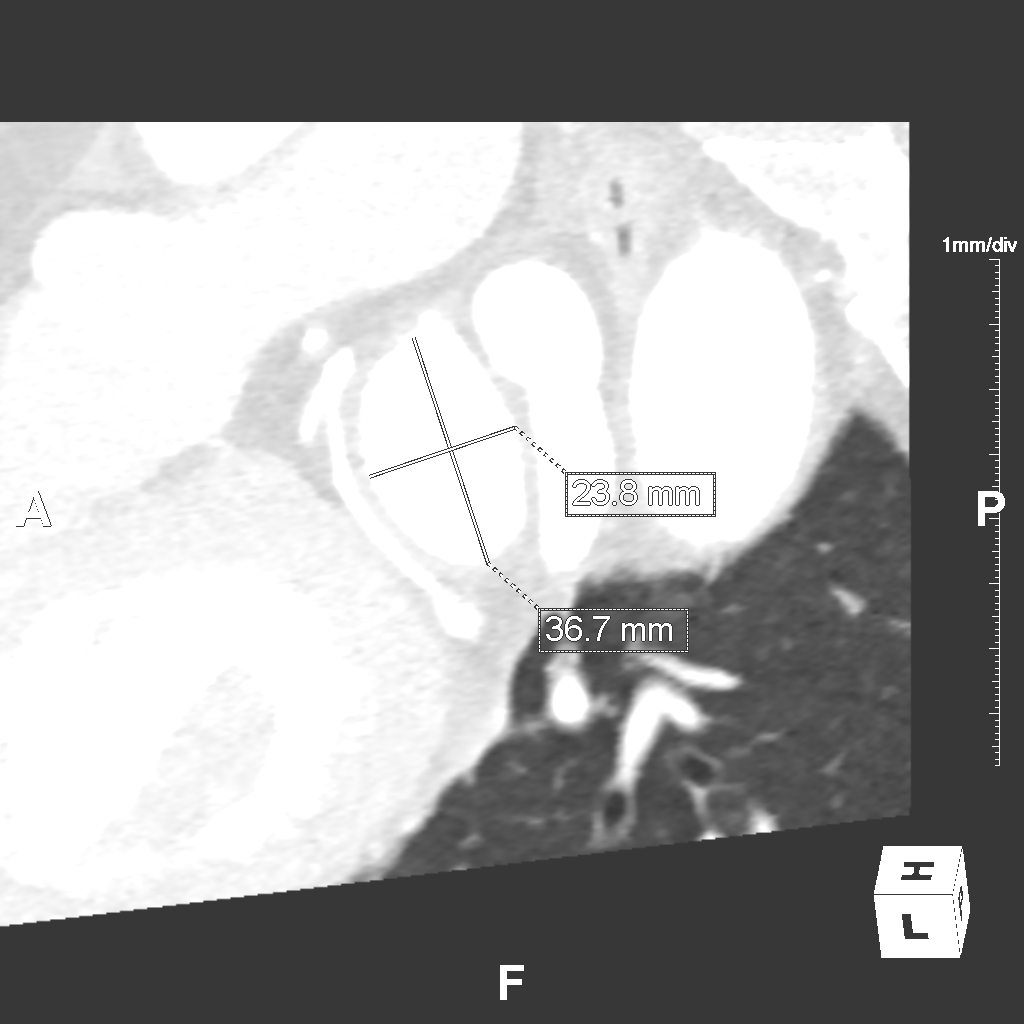
[im 10/18  vessel]
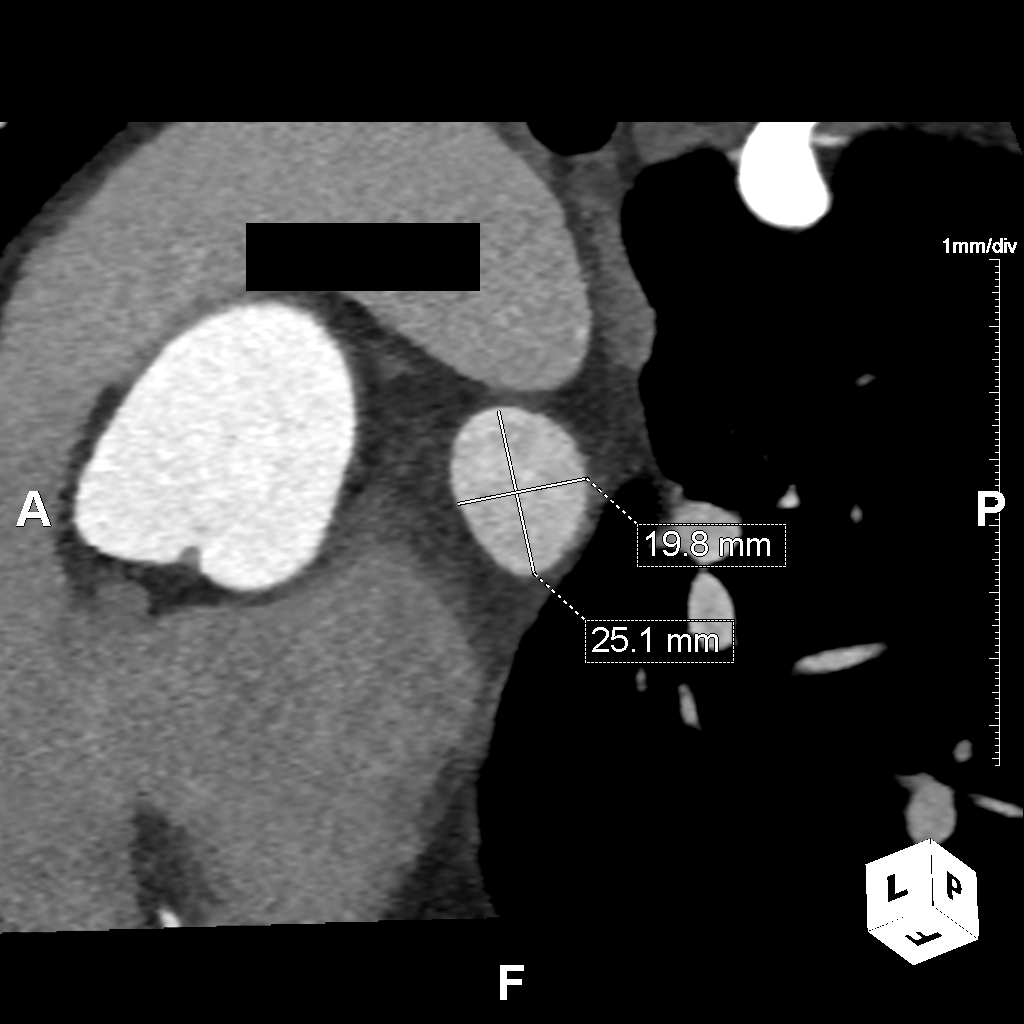
[im 15/18  vessel]
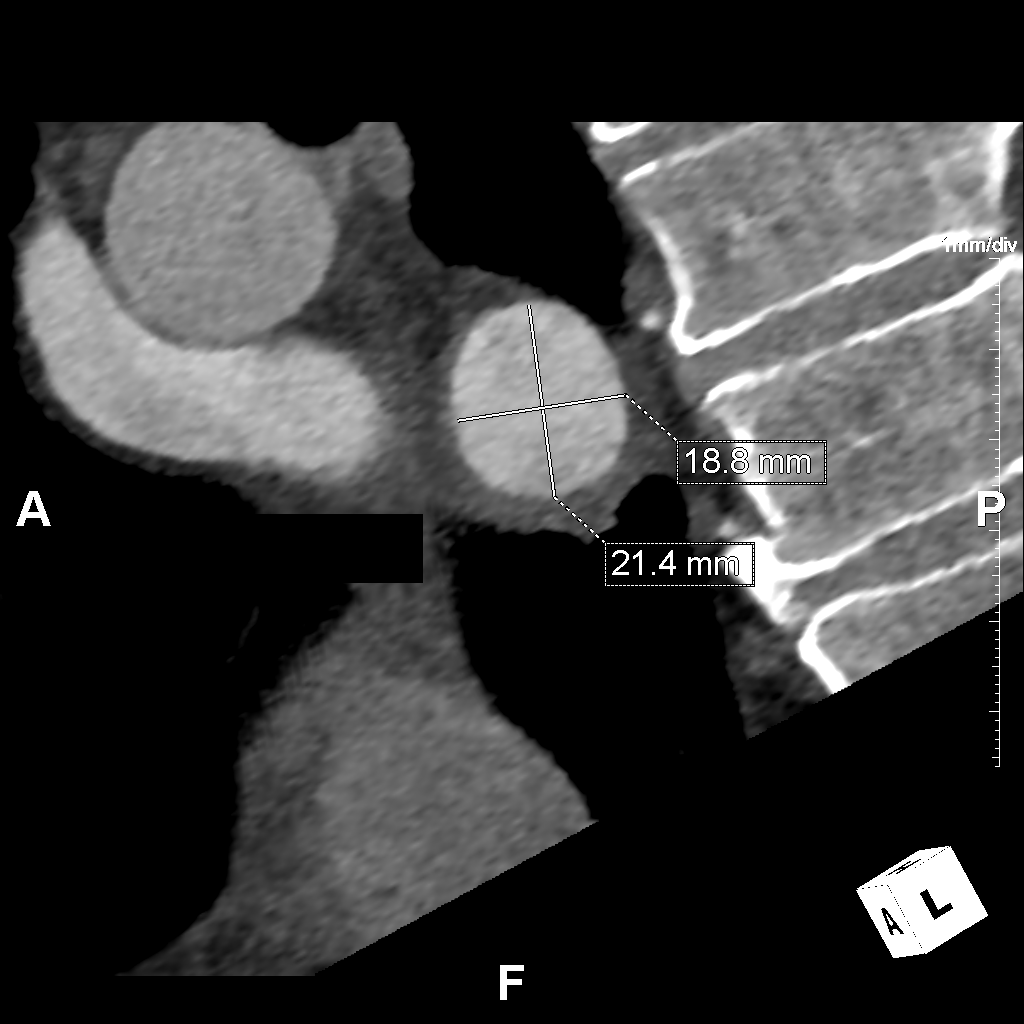

[3 of 18 positions shown; findings below may reference images not displayed]

FINDINGS: Aortic atherosclerosis with ectasia of ascending thoracic aorta (4.3
cm in diameter). Within the visualized portions of the thorax there
are no suspicious appearing pulmonary nodules or masses, there is no
acute consolidative airspace disease, no pleural effusions, no
pneumothorax and no lymphadenopathy. Visualized portions of the
upper abdomen are unremarkable. There are no aggressive appearing
lytic or blastic lesions noted in the visualized portions of the
skeleton.
IMPRESSION: 1.  Aortic Atherosclerosis (BYXWZ-G9O.O).
2. Ectasia of ascending thoracic aorta (4.3 cm in diameter).
Recommend annual imaging followup by CTA or MRA. This recommendation
follows 1272 ACCF/AHA/AATS/ACR/ASA/SCA/AUJLA/GLEMAR/THACKERAY/RAFE Guidelines
for the Diagnosis and Management of Patients with Thoracic Aortic
Disease. Circulation. 1272; 121: E266-e369. Aortic aneurysm NOS
(BYXWZ-1P5.2).
FINDINGS: A 120 kV prospective scan was triggered in the descending thoracic
aorta at 111 HU's. Gantry rotation speed was 280 msecs and
collimation was .9 mm. 6.25 mg of PO Carvedilol, 100 and no NTG was
given. The 3D data set was reconstructed in 5% intervals of the
60-80 % of the R-R cycle. Diastolic phases were analyzed on a
dedicated work station using MPR, MIP and VRT modes. The patient
received 80 cc of contrast.

There is normal pulmonary vein drainage into the left atrium (2 on
the right and 2 on the left) with ostial measurements as follows:

RUPV: 25.1 x 19.8 mm

RLPV: 21.4 x 18.8 mm

LUPV: 21.2 x 16.5 mm

LLPV: 20.3 x 11.1 mm

The left atrial appendage is large with chicken wing morphology, 1
major lobe and ostial size 37 x 24 mm and length 56 mm. There is no
thrombus in the left atrial appendage.

The esophagus runs to the left from the left atrial midline and is
in the proximity to the ostia of the LUPV and LLPV.

Aorta: Mild ascending aortic aneurysm with maximum diameter 41 mm.
No dissection, mild diffuse atherosclerotic plaque and
calcifications.

Aortic Valve:  Trileaflet.  No calcifications.

Coronary Arteries: Normal coronary origin. Right dominance. The
study was performed without use of NTG and insufficient for plaque
evaluation.
IMPRESSION: 1. There is normal pulmonary vein drainage into the left atrium.

2. The left atrial appendage is large with chicken wing morphology,
1 major lobe and ostial size 37 x 24 mm and length 56 mm. There is
no thrombus in the left atrial appendage.

3. The esophagus runs to the left from the left atrial midline and
is in the proximity to the ostia of the LUPV and LLPV.

4. Mild ascending aortic aneurysm with maximum diameter 41 mm. No
dissection, mild diffuse atherosclerotic plaque and calcifications.

5. Pulmonary artery is dilated measuring 34 mm.

*** End of Addendum ***
EXAM:
OVER-READ INTERPRETATION  CT CHEST

The following report is an over-read performed by radiologist Dr.
Adjoa Coop [REDACTED] on 06/15/2020. This
over-read does not include interpretation of cardiac or coronary
anatomy or pathology. The coronary calcium score/coronary CTA
interpretation by the cardiologist is attached.
FINDINGS: Aortic atherosclerosis with ectasia of ascending thoracic aorta (4.3
cm in diameter). Within the visualized portions of the thorax there
are no suspicious appearing pulmonary nodules or masses, there is no
acute consolidative airspace disease, no pleural effusions, no
pneumothorax and no lymphadenopathy. Visualized portions of the
upper abdomen are unremarkable. There are no aggressive appearing
lytic or blastic lesions noted in the visualized portions of the
skeleton.
IMPRESSION: 1.  Aortic Atherosclerosis (BYXWZ-G9O.O).
2. Ectasia of ascending thoracic aorta (4.3 cm in diameter).
Recommend annual imaging followup by CTA or MRA. This recommendation
follows 1272 ACCF/AHA/AATS/ACR/ASA/SCA/AUJLA/GLEMAR/THACKERAY/RAFE Guidelines
for the Diagnosis and Management of Patients with Thoracic Aortic
Disease. Circulation. 1272; 121: E266-e369. Aortic aneurysm NOS
(BYXWZ-1P5.2).

## 2021-04-02 NOTE — Telephone Encounter (Signed)
Patient called back. Patient stated he has taken Valium in the past for MRI's. Will forward to covering provider.

## 2021-04-06 ENCOUNTER — Telehealth: Payer: Self-pay | Admitting: Cardiology

## 2021-04-06 MED ORDER — DIAZEPAM 5 MG PO TABS: ORAL_TABLET | ORAL | 0 refills | Status: DC

## 2021-04-06 NOTE — Telephone Encounter (Signed)
Pre Dr. Radford Pax, [place] to prescribe Valium '5mg'$   #1 tablet with no refills to take 30 min before MRI - patient needs someone to take him there and back home on day of study. Called patient's pharmacy and placed order.

## 2021-04-06 NOTE — Telephone Encounter (Signed)
Patient calling back about getting valium called in to his pharmacy for his MRI tomorrow.

## 2021-04-06 NOTE — Telephone Encounter (Signed)
Called patient and informed him that prescription has been sent to his pharmacy. Patient verbalized understanding.

## 2021-04-07 ENCOUNTER — Encounter: Payer: Self-pay | Admitting: Family Medicine

## 2021-04-07 ENCOUNTER — Ambulatory Visit (HOSPITAL_COMMUNITY)
Admission: RE | Admit: 2021-04-07 | Discharge: 2021-04-07 | Disposition: A | Discharge status: Home / Self Care | Payer: Medicare Other | Source: Ambulatory Visit | Attending: Cardiology | Admitting: Cardiology

## 2021-04-07 ENCOUNTER — Ambulatory Visit: Payer: Medicare Other | Admitting: Family Medicine

## 2021-04-07 ENCOUNTER — Other Ambulatory Visit: Payer: Self-pay

## 2021-04-07 DIAGNOSIS — Z85528 Personal history of other malignant neoplasm of kidney: Secondary | ICD-10-CM | POA: Diagnosis not present

## 2021-04-07 DIAGNOSIS — I712 Thoracic aortic aneurysm, without rupture: Secondary | ICD-10-CM | POA: Insufficient documentation

## 2021-04-07 DIAGNOSIS — E1121 Type 2 diabetes mellitus with diabetic nephropathy: Secondary | ICD-10-CM | POA: Diagnosis not present

## 2021-04-07 DIAGNOSIS — Z79899 Other long term (current) drug therapy: Secondary | ICD-10-CM | POA: Insufficient documentation

## 2021-04-07 DIAGNOSIS — E782 Mixed hyperlipidemia: Secondary | ICD-10-CM

## 2021-04-07 DIAGNOSIS — I7121 Aneurysm of the ascending aorta, without rupture: Secondary | ICD-10-CM

## 2021-04-07 DIAGNOSIS — I10 Essential (primary) hypertension: Secondary | ICD-10-CM | POA: Insufficient documentation

## 2021-04-07 DIAGNOSIS — Z905 Acquired absence of kidney: Secondary | ICD-10-CM | POA: Diagnosis not present

## 2021-04-07 MED ORDER — GADOBUTROL 1 MMOL/ML IV SOLN
9.0000 mL | Freq: Once | INTRAVENOUS | Status: AC | PRN
  Administered 2021-04-07: 9 mL via INTRAVENOUS

## 2021-04-07 NOTE — Progress Notes (Signed)
Carelink Summary Report / Loop Recorder 

## 2021-04-07 NOTE — Telephone Encounter (Signed)
Pt had MRI done today 9/7. Will close this encounter due to test being complete.

## 2021-04-08 ENCOUNTER — Encounter: Payer: Self-pay | Admitting: Family Medicine

## 2021-04-12 ENCOUNTER — Other Ambulatory Visit: Payer: Self-pay

## 2021-04-12 DIAGNOSIS — N289 Disorder of kidney and ureter, unspecified: Secondary | ICD-10-CM

## 2021-04-13 ENCOUNTER — Other Ambulatory Visit: Payer: Self-pay

## 2021-04-13 ENCOUNTER — Encounter: Payer: Self-pay | Admitting: Family Medicine

## 2021-04-13 DIAGNOSIS — N289 Disorder of kidney and ureter, unspecified: Secondary | ICD-10-CM

## 2021-04-14 ENCOUNTER — Encounter: Payer: Self-pay | Admitting: Family Medicine

## 2021-04-19 NOTE — Telephone Encounter (Signed)
LVM appointment with Dr Jerline Pain on 09/27 will be cancelled since patient has Urologist appointment

## 2021-04-21 LAB — CUP PACEART REMOTE DEVICE CHECK
Date Time Interrogation Session: 20220919195809
Implantable Pulse Generator Implant Date: 20210819

## 2021-04-27 ENCOUNTER — Ambulatory Visit: Payer: Medicare Other | Admitting: Family Medicine

## 2021-04-27 ENCOUNTER — Telehealth: Payer: Self-pay

## 2021-04-27 NOTE — Telephone Encounter (Signed)
I spoke with the patient and he states he is now being followed by [facility].

## 2021-05-02 ENCOUNTER — Other Ambulatory Visit: Payer: Self-pay | Admitting: Internal Medicine

## 2021-05-12 ENCOUNTER — Encounter: Payer: Self-pay | Admitting: Family Medicine

## 2021-05-13 ENCOUNTER — Encounter: Payer: Self-pay | Admitting: Family Medicine

## 2021-05-13 ENCOUNTER — Other Ambulatory Visit: Payer: Self-pay

## 2021-05-13 DIAGNOSIS — B182 Chronic viral hepatitis C: Secondary | ICD-10-CM

## 2021-05-13 DIAGNOSIS — B1920 Unspecified viral hepatitis C without hepatic coma: Secondary | ICD-10-CM

## 2021-05-13 NOTE — Addendum Note (Signed)
Addended by: Clyde Lundborg A on: 05/13/2021 10:30 AM   Modules accepted: Orders

## 2021-05-19 ENCOUNTER — Other Ambulatory Visit: Payer: Self-pay | Admitting: *Deleted

## 2021-05-19 MED ORDER — HYDROCHLOROTHIAZIDE 25 MG PO TABS: 25.0000 mg | ORAL_TABLET | Freq: Every day | ORAL | 3 refills | Status: DC

## 2021-05-24 ENCOUNTER — Other Ambulatory Visit: Payer: Self-pay

## 2021-05-24 MED ORDER — HYDROCHLOROTHIAZIDE 25 MG PO TABS: 25.0000 mg | ORAL_TABLET | Freq: Every day | ORAL | 0 refills | Status: DC

## 2021-05-25 ENCOUNTER — Other Ambulatory Visit: Payer: Self-pay | Admitting: Family Medicine

## 2021-05-26 ENCOUNTER — Other Ambulatory Visit: Payer: Self-pay | Admitting: Family Medicine

## 2021-05-27 DIAGNOSIS — I4819 Other persistent atrial fibrillation: Secondary | ICD-10-CM | POA: Diagnosis not present

## 2021-05-28 DIAGNOSIS — D3002 Benign neoplasm of left kidney: Secondary | ICD-10-CM | POA: Diagnosis not present

## 2021-05-28 DIAGNOSIS — Z85528 Personal history of other malignant neoplasm of kidney: Secondary | ICD-10-CM | POA: Diagnosis not present

## 2021-05-31 ENCOUNTER — Telehealth: Payer: Self-pay | Admitting: Family Medicine

## 2021-05-31 NOTE — Progress Notes (Signed)
  Care Management   Follow Up Note   05/31/2021 Name: Timothy Dickson MRN: [id number] DOB: August 02, 1945   Referred by: [name], MD Reason for referral : No chief complaint on file.   An unsuccessful telephone outreach was attempted today. The patient was referred to the case management team for assistance with care management and care coordination.   Follow Up Plan: The care management team will reach out to the patient again over the next 7 days.   [place]

## 2021-06-04 ENCOUNTER — Other Ambulatory Visit: Payer: Self-pay | Admitting: *Deleted

## 2021-06-04 ENCOUNTER — Telehealth: Payer: Self-pay | Admitting: Family Medicine

## 2021-06-04 MED ORDER — CARVEDILOL 6.25 MG PO TABS: 6.2500 mg | ORAL_TABLET | Freq: Two times a day (BID) | ORAL | 11 refills | Status: DC

## 2021-06-04 NOTE — Progress Notes (Signed)
  Care Management   Follow Up Note   06/04/2021 Name: Timothy Dickson MRN: [id number] DOB: 1946/02/21   Referred by: [name], MD Reason for referral : No chief complaint on file.   A second unsuccessful telephone outreach was attempted today. The patient was referred to the case management team for assistance with care management and care coordination.   Follow Up Plan: The care management team will reach out to the patient again over the next 7 days.   [name]

## 2021-06-07 ENCOUNTER — Telehealth: Payer: Self-pay | Admitting: Family Medicine

## 2021-06-07 NOTE — Chronic Care Management (AMB) (Signed)
  Care Management   Follow Up Note   06/07/2021 Name: Timothy Dickson MRN: [id number] DOB: 1946-04-13   Referred by: [name], MD Reason for referral : No chief complaint on file.   Successful contact was made with the patient to discuss care management and care coordination services. Patient declines engagement at this time.   Follow Up Plan: No further follow up required:    [place]

## 2021-06-08 ENCOUNTER — Other Ambulatory Visit: Payer: Self-pay | Admitting: Urology

## 2021-06-08 DIAGNOSIS — C642 Malignant neoplasm of left kidney, except renal pelvis: Secondary | ICD-10-CM

## 2021-06-08 NOTE — Progress Notes (Deleted)
Cardiology Office Note:    Date:  06/08/2021   ID:  Timothy Dickson, DOB [birth year], MRN [id number]  PCP:  [name], MD   [facility] Providers Cardiologist:  Ena Dawley, MD (Inactive) Electrophysiologist:  Thompson Grayer, MD {    Referring MD: [name], MD    History of Present Illness:    Timothy Dickson is a 75 y.o. male with a hx of  hypertension, ascending aortic aneurysm, hyperlipidemia, coronary calcifications, persistent atrial fibrillation and SVT who failed medical therapy (Flecainide) and underwent EPS/RFCA of atrial fibrillation in 09/2018 who now presents to clinic for follow-up. The patient was previously followed by Dr. Meda Coffee.  Per review of the record, the patient has a history of paroxysmal Afib/flutter s/p successful ablation in 09/2018 followed by Dr. Rayann Heman. ILR implanted in 2021 for palpitations shows 0% burden of Afib. Flec has been decreased to 50mg  BID per Dr. Rayann Heman. Last saw Dr. Rayann Heman on 12/23/20 where he was doing well without palpitations.  Today, ***  Past Medical History:  Diagnosis Date   Atrial tachycardia ([place])    documented by holter 2016. Followed by Dr. Liane Comber and Dr. Rayann Heman.   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 no further tx other than surgery [facility].   Cirrhosis ([place])    [facility] dx, early stage- follow up visits at [facility].   CRI (chronic renal insufficiency)    s/p R nephrectomy in 1999 for malignancy   Diabetes mellitus without complication (HCC)    NIDDM- dx. 3-4 years ago diet control   Diverticulosis of colon    mild   GERD (gastroesophageal reflux disease)    Headache(784.0)    Hepatitis C    s/p therapy [facility] (curative)/with Harvoni   History of BPH    no recent issues   History of substance abuse ([place])    past history -none in 38 yrs   Hyperglycemia    Hypertension    Osteoarthritis, knee    knees bilaterally    Pneumonia 1975   hx of 35 years ago    Past Surgical History:  Procedure Laterality  Date   Arthroscopy right knee     may '11 (Dr Alvan Dame)   Arthroscopy, left knee,     ATRIAL FIBRILLATION ABLATION N/A 10/11/2018   Procedure: ATRIAL FIBRILLATION ABLATION;  Surgeon: Thompson Grayer, MD;  Location: [place] [facility];  Service: Cardiovascular;  Laterality: N/A;   ATRIAL FIBRILLATION ABLATION N/A 06/19/2020   Procedure: ATRIAL FIBRILLATION ABLATION;  Surgeon: Thompson Grayer, MD;  Location: [place] [facility];  Service: Cardiovascular;  Laterality: N/A;   CARDIAC ELECTROPHYSIOLOGY MAPPING AND ABLATION     history of liver biopsy  2007   History of Nephrectomy Right 1999   implantable loop recorder placement  03/19/2020    Medtronic Reveal Linq model LNQ 22 (RLB [id number] ) implantable loop recorder implanted by Dr Rayann Heman for evaluation of palpitations and afib management post ablation   KNEE ARTHROSCOPY Left 10/10/2012   Procedure: LEFT KNEE ARTHROSCOPY WITH MENSCIAL DEBRIDEMENT AND CHONDROPLASTY;  Surgeon: Gearlean Alf, MD;  Location: [facility];  Service: Orthopedics;  Laterality: Left;   left shoulder repair for bone spur  1990's   TEE WITHOUT CARDIOVERSION N/A 10/11/2018   Procedure: TRANSESOPHAGEAL ECHOCARDIOGRAM (TEE);  Surgeon: Buford Dresser, MD;  Location: [facility];  Service: Cardiovascular;  Laterality: N/A;  ablation to follow at [place] Right 08/15/2016   Procedure: RIGHT TOTAL KNEE ARTHROPLASTY, CORTISONE  INJECTION OF LEFT KNEE;  Surgeon: Gaynelle Arabian, MD;  Location: [facility];  Service: Orthopedics;  Laterality: Right;   TOTAL KNEE ARTHROPLASTY Left 09/04/2017   Procedure: LEFT TOTAL KNEE ARTHROPLASTY;  Surgeon: Gaynelle Arabian, MD;  Location: [facility];  Service: Orthopedics;  Laterality: Left;   Transurethral needle ablation procedure     TUNA procedure      Current Medications: No outpatient medications have been marked as taking for the 06/22/21 encounter (Appointment) with Freada Bergeron, MD.     Allergies:   Tramadol hcl,  Acetaminophen, Nsaids, and Pravastatin   Social History   Socioeconomic History   Marital status: Married    Spouse name: Not on file   Number of children: Not on file   Years of education: Not on file   Highest education level: Not on file  Occupational History   Not on file  Tobacco Use   Smoking status: Former    Packs/day: 1.00    Years: 15.00    Pack years: 15.00    Types: Cigarettes    Quit date: 08/01/1977    Years since quitting: 43.8   Smokeless tobacco: Never  Vaping Use   Vaping Use: Never used  Substance and Sexual Activity   Alcohol use: No    Comment: past history -none in 38 yrs   Drug use: No    Comment: past hx none in 38 yrs   Sexual activity: Yes    Partners: Female  Other Topics Concern   Not on file  Social History Narrative   [place], [place], [place] psych. Married- '84. 2 step-daughters, 4 grandchildren. Work  Retired Radio producer  Marriage in good health.   Two story home   Drinks caffeine   Ambidextrous    Social Determinants of Health   Financial Resource Strain: Not on file  Food Insecurity: Not on file  Transportation Needs: Not on file  Physical Activity: Not on file  Stress: Not on file  Social Connections: Not on file     Family History: The patient's ***family history includes Alcohol abuse in his father, sister, and another family member; Breast cancer in his sister; CVA in his mother; Cancer in his brother; Coronary artery disease in his mother; Diabetes in his sister; Esophageal cancer in his sister; Lung cancer in his sister; Prostate cancer in his brother.  ROS:   Please see the history of present illness.    *** All other systems reviewed and are negative.  EKGs/Labs/Other Studies Reviewed:    The following studies were reviewed today: MRI April 24, 2021: IMPRESSION: Relatively unchanged size and configuration of the thoracic aorta, with the maximum estimated diameter of the ascending aorta 43 mm  on the current MR study. Recommend annual imaging followup by CTA or MRA. This recommendation follows 2010 ACCF/AHA/AATS/ACR/ASA/SCA/SCAI/SIR/STS/SVM Guidelines for the Diagnosis and Management of Patients with Thoracic Aortic Disease. Circulation. 2010; 121: M546-T035. Aortic aneurysm NOS (ICD10-I71.9)   Incidental imaging of exophytic rounded lesion of the left kidney. This may represent a cyst that was identified on prior ultrasound of 2014, however, the current study is nondiagnostic. Nonemergent abdominal ultrasound of the abdomen recommended given the patient's history of right-sided nephrectomy for RCC.  EKG:  EKG is *** ordered today.  The ekg ordered today demonstrates ***  Recent Labs: 03/24/2021: ALT 21; BUN 22; Creatinine, Ser 1.19; Hemoglobin 15.0; Platelets 198.0; Potassium 4.0; Sodium 137; TSH 1.11  Recent Lipid Panel    Component Value Date/Time  CHOL 90 03/24/2021 1100   CHOL 108 04/07/2020 1419   CHOL 206 (H) 12/21/2015 1522   TRIG 81.0 03/24/2021 1100   TRIG 161 (H) 12/21/2015 1522   HDL 42.00 03/24/2021 1100   HDL 48 04/07/2020 1419   HDL 45 12/21/2015 1522   CHOLHDL 2 03/24/2021 1100   VLDL 16.2 03/24/2021 1100   LDLCALC 32 03/24/2021 1100   LDLCALC 41 04/07/2020 1419   LDLCALC 129 12/21/2015 1522     Risk Assessment/Calculations:   {Does this patient have ATRIAL FIBRILLATION?:[phone]}       Physical Exam:    VS:  There were no vitals taken for this visit.    Wt Readings from Last 3 Encounters:  03/24/21 208 lb 12.8 oz (94.7 kg)  12/23/20 212 lb (96.2 kg)  09/18/20 215 lb (97.5 kg)     GEN: *** Well nourished, well developed in no acute distress HEENT: Normal NECK: No JVD; No carotid bruits LYMPHATICS: No lymphadenopathy CARDIAC: ***RRR, no murmurs, rubs, gallops RESPIRATORY:  Clear to auscultation without rales, wheezing or rhonchi  ABDOMEN: Soft, non-tender, non-distended MUSCULOSKELETAL:  No edema; No deformity  SKIN: Warm and  dry NEUROLOGIC:  Alert and oriented x 3 PSYCHIATRIC:  Normal affect   ASSESSMENT:    No diagnosis found. PLAN:    In order of problems listed above:  #Paroxysmal Afib/Flutter: S/p ablation 09/2018 with 0% burden of Afib on ILR. Follwed by Dr. Rayann Heman. CHADs-vasc 3. -Follow-up with EP as scheduled -Continue flex 50mg  BID -Continue eliquis for AC  #HTN: -Continue coreg 6.25mg  BID -Continue HCTZ 25mg  daily  #HLD: -Continue repatha 140mg  q 2weeks  #AAA: Not on ASA due to need for [facility]. On repatha. MRI on 04/07/2021 shows max diameter 17mm which is overall stable. Will need annual monitoring. -Continue annual imaging with MRI/CTA -Continue repatha  -Not on ASA due to need for [facility]  #Exophytic rounded lesion of left kidney: Noted incidentally on MRI. Given history of RCC s/p nephrectomy, will need abdominal ultrasound. -Will need abdominal ultrasound -Follow-up with PCP      {Are you ordering a CV Procedure (e.g. stress test, cath, DCCV, TEE, etc)?   Press F2        :[id number]}    Medication Adjustments/Labs and Tests Ordered: Current medicines are reviewed at length with the patient today.  Concerns regarding medicines are outlined above.  No orders of the defined types were placed in this encounter.  No orders of the defined types were placed in this encounter.   There are no Patient Instructions on file for this visit.   Signed, Freada Bergeron, MD  06/08/2021 8:59 PM    [facility]

## 2021-06-10 DIAGNOSIS — C220 Liver cell carcinoma: Secondary | ICD-10-CM | POA: Diagnosis not present

## 2021-06-10 DIAGNOSIS — K7460 Unspecified cirrhosis of liver: Secondary | ICD-10-CM | POA: Diagnosis not present

## 2021-06-17 ENCOUNTER — Other Ambulatory Visit: Payer: Self-pay | Admitting: Urology

## 2021-06-17 DIAGNOSIS — D3002 Benign neoplasm of left kidney: Secondary | ICD-10-CM

## 2021-06-22 ENCOUNTER — Encounter: Payer: Self-pay | Admitting: Cardiology

## 2021-06-22 ENCOUNTER — Other Ambulatory Visit: Payer: Self-pay

## 2021-06-22 ENCOUNTER — Ambulatory Visit: Payer: Medicare Other | Admitting: Cardiology

## 2021-06-22 VITALS — BP 122/70 | HR 85 | Ht 72.0 in | Wt 213.2 lb

## 2021-06-22 DIAGNOSIS — E782 Mixed hyperlipidemia: Secondary | ICD-10-CM

## 2021-06-22 DIAGNOSIS — I7121 Aneurysm of the ascending aorta, without rupture: Secondary | ICD-10-CM

## 2021-06-22 DIAGNOSIS — I4819 Other persistent atrial fibrillation: Secondary | ICD-10-CM

## 2021-06-22 DIAGNOSIS — I10 Essential (primary) hypertension: Secondary | ICD-10-CM

## 2021-06-22 NOTE — Progress Notes (Signed)
Cardiology Office Note:    Date:  06/22/2021   ID:  Timothy Dickson, DOB 04/22/1946, MRN [id number]  PCP:  [name], MD   [facility] Providers Cardiologist:  Ena Dawley, MD (Inactive) Electrophysiologist:  Thompson Grayer, MD  {    Referring MD: [name], MD    History of Present Illness:    Timothy Dickson is a 75 y.o. male with a hx of  hypertension, ascending aortic aneurysm, hyperlipidemia, coronary calcifications, persistent atrial fibrillation and SVT who failed medical therapy (Flecainide) and underwent EPS/RFCA of atrial fibrillation in 09/2018 who now presents to clinic for follow-up. The patient was previously followed by Dr. Meda Coffee.  Per review of the record, the patient has a history of paroxysmal Afib/flutter s/p successful ablation in 09/2018 followed by Dr. Rayann Heman. ILR implanted in 2021 for palpitations shows 0% burden of Afib. Flec has been decreased to 50mg  BID per Dr. Rayann Heman. Last saw Dr. Rayann Heman on 12/23/20 where he was doing well without palpitations.  Today, he is doing good overall. He switched cardiac electrophysiologist from Dr. Rayann Heman to Dr. Remus Blake at [facility]. He is currently taking and tolerating flecainide. Of note, he is expected to take a treadmill stress test in December. He is taking Repatha shots and doing well.    He is able to walk the dog about 10 blocks without exertional symptoms. Weather permitting, he enjoys biking 6 to 7 miles weekly. He denies any palpitations, chest pain, or shortness of breath, headaches, syncope, orthopnea, PND, or lower extremity edema. No bleeding issues.  Past Medical History:  Diagnosis Date   Atrial tachycardia ([place])    documented by holter 2016. Followed by Dr. Liane Comber and Dr. Rayann Heman.   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 no further tx other than surgery [facility].   Cirrhosis ([place])    [facility] dx, early stage- follow up visits at [facility].   CRI (chronic renal insufficiency)    s/p R  nephrectomy in 1999 for malignancy   Diabetes mellitus without complication (HCC)    NIDDM- dx. 3-4 years ago diet control   Diverticulosis of colon    mild   GERD (gastroesophageal reflux disease)    Headache(784.0)    Hepatitis C    s/p therapy [facility] (curative)/with Harvoni   History of BPH    no recent issues   History of substance abuse ([place])    past history -none in 38 yrs   Hyperglycemia    Hypertension    Osteoarthritis, knee    knees bilaterally    Pneumonia 1975   hx of 35 years ago    Past Surgical History:  Procedure Laterality Date   Arthroscopy right knee     may '11 (Dr Alvan Dame)   Arthroscopy, left knee,     ATRIAL FIBRILLATION ABLATION N/A 10/11/2018   Procedure: ATRIAL FIBRILLATION ABLATION;  Surgeon: Thompson Grayer, MD;  Location: [place] [facility];  Service: Cardiovascular;  Laterality: N/A;   ATRIAL FIBRILLATION ABLATION N/A 06/19/2020   Procedure: ATRIAL FIBRILLATION ABLATION;  Surgeon: Thompson Grayer, MD;  Location: [place] [facility];  Service: Cardiovascular;  Laterality: N/A;   CARDIAC ELECTROPHYSIOLOGY MAPPING AND ABLATION     history of liver biopsy  2007   History of Nephrectomy Right 1999   implantable loop recorder placement  03/19/2020    Medtronic Reveal Linq model LNQ 22 (RLB [id number] ) implantable loop recorder implanted by Dr Rayann Heman for evaluation of palpitations and afib management  post ablation   KNEE ARTHROSCOPY Left 10/10/2012   Procedure: LEFT KNEE ARTHROSCOPY WITH MENSCIAL DEBRIDEMENT AND CHONDROPLASTY;  Surgeon: Gearlean Alf, MD;  Location: [facility];  Service: Orthopedics;  Laterality: Left;   left shoulder repair for bone spur  1990's   TEE WITHOUT CARDIOVERSION N/A 10/11/2018   Procedure: TRANSESOPHAGEAL ECHOCARDIOGRAM (TEE);  Surgeon: Buford Dresser, MD;  Location: [facility] ENDOSCOPY;  Service: Cardiovascular;  Laterality: N/A;  ablation to follow at [place] Right 08/15/2016   Procedure: RIGHT TOTAL KNEE  ARTHROPLASTY, CORTISONE INJECTION OF LEFT KNEE;  Surgeon: Gaynelle Arabian, MD;  Location: [facility];  Service: Orthopedics;  Laterality: Right;   TOTAL KNEE ARTHROPLASTY Left 09/04/2017   Procedure: LEFT TOTAL KNEE ARTHROPLASTY;  Surgeon: Gaynelle Arabian, MD;  Location: [facility];  Service: Orthopedics;  Laterality: Left;   Transurethral needle ablation procedure     TUNA procedure      Current Medications: Current Meds  Medication Sig   acetaminophen (TYLENOL) 650 MG CR tablet Take 650 mg by mouth See admin instructions. Take 650 mg at 11 am and take 650 mg at 4-5 am (take with gabapentin)   allopurinol (ZYLOPRIM) 100 MG tablet Take 1 tablet (100 mg total) by mouth daily.   Aloe-Sodium Chloride (AYR SALINE NASAL GEL NA) Place 1 application into the nose at bedtime.   carvedilol (COREG) 6.25 MG tablet Take 1 tablet (6.25 mg total) by mouth 2 (two) times daily.   ELIQUIS 5 MG TABS tablet TAKE 1 TABLET BY MOUTH TWICE DAILY.   Evolocumab (REPATHA SURECLICK) 742 MG/ML SOAJ INJECT CONTENTS OF 1 PEN SUBCUTANEOUSLY EVERY 14 DAYS.   famotidine (PEPCID) 20 MG tablet Take 20 mg by mouth daily as needed for heartburn.    flecainide (TAMBOCOR) 100 MG tablet Take 100 mg by mouth 2 (two) times daily.   gabapentin (NEURONTIN) 800 MG tablet TAKE ONE TABLET BY MOUTH TWICE DAILY.   hydrochlorothiazide (HYDRODIURIL) 25 MG tablet Take 1 tablet (25 mg total) by mouth daily.   Multiple Vitamin (MULTIVITAMIN) capsule Take 1 capsule by mouth daily.   omeprazole (PRILOSEC) 20 MG capsule Take 20 mg by mouth every other day.    sodium chloride (OCEAN) 0.65 % SOLN nasal spray Place 1 spray into both nostrils daily.     Allergies:   Tramadol hcl, Acetaminophen, Nsaids, and Pravastatin   Social History   Socioeconomic History   Marital status: Married    Spouse name: Not on file   Number of children: Not on file   Years of education: Not on file   Highest education level: Not on file  Occupational History   Not on file   Tobacco Use   Smoking status: Former    Packs/day: 1.00    Years: 15.00    Pack years: 15.00    Types: Cigarettes    Quit date: 08/01/1977    Years since quitting: 43.9   Smokeless tobacco: Never  Vaping Use   Vaping Use: Never used  Substance and Sexual Activity   Alcohol use: No    Comment: past history -none in 38 yrs   Drug use: No    Comment: past hx none in 38 yrs   Sexual activity: Yes    Partners: Female  Other Topics Concern   Not on file  Social History Narrative   [place], [place], [place] psych. Married- '84. 2 step-daughters, 4 grandchildren. Work  Retired Radio producer  Marriage in good health.  Two story home   Drinks caffeine   Ambidextrous    Social Determinants of Health   Financial Resource Strain: Not on file  Food Insecurity: Not on file  Transportation Needs: Not on file  Physical Activity: Not on file  Stress: Not on file  Social Connections: Not on file     Family History: The patient's family history includes Alcohol abuse in his father, sister, and another family member; Breast cancer in his sister; CVA in his mother; Cancer in his brother; Coronary artery disease in his mother; Diabetes in his sister; Esophageal cancer in his sister; Lung cancer in his sister; Prostate cancer in his brother.  ROS:   Please see the history of present illness.    Review of Systems  Constitutional:  Negative for chills and fever.  HENT:  Negative for congestion and sore throat.   Eyes:  Negative for blurred vision and pain.  Respiratory:  Negative for cough and shortness of breath.   Cardiovascular:  Negative for chest pain, palpitations, orthopnea, claudication, leg swelling and PND.  Gastrointestinal:  Negative for heartburn and vomiting.  Genitourinary:  Negative for frequency and urgency.  Musculoskeletal:  Negative for back pain and myalgias.  Skin:  Negative for itching and rash.  Neurological:  Positive for dizziness. Negative  for headaches.  Endo/Heme/Allergies:  Negative for polydipsia. Does not bruise/bleed easily.  Psychiatric/Behavioral:  Negative for memory loss. The patient does not have insomnia.    All other systems reviewed and are negative.  EKGs/Labs/Other Studies Reviewed:    The following studies were reviewed today: MRI 04-27-21: IMPRESSION: Relatively unchanged size and configuration of the thoracic aorta, with the maximum estimated diameter of the ascending aorta 43 mm on the current MR study. Recommend annual imaging followup by CTA or MRA. This recommendation follows 2010 ACCF/AHA/AATS/ACR/ASA/SCA/SCAI/SIR/STS/SVM Guidelines for the Diagnosis and Management of Patients with Thoracic Aortic Disease. Circulation. 2010; 121: X937-J696. Aortic aneurysm NOS (ICD10-I71.9) Incidental imaging of exophytic rounded lesion of the left kidney. This may represent a cyst that was identified on prior ultrasound of 2014, however, the current study is nondiagnostic. Nonemergent abdominal ultrasound of the abdomen recommended given the patient's history of right-sided nephrectomy for RCC.  CT Cardiac Morph 06/15/20 1. There is normal pulmonary vein drainage into the left atrium. 2. The left atrial appendage is large with chicken wing morphology, 1 major lobe and ostial size 37 x 24 mm and length 56 mm. There is no thrombus in the left atrial appendage. 3. The esophagus runs to the left from the left atrial midline and is in the proximity to the ostia of the LUPV and LLPV. 4. Mild ascending aortic aneurysm with maximum diameter 41 mm. No dissection, mild diffuse atherosclerotic plaque and calcifications. 5. 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  Echo TEE 10/11/18 1. The left ventricle has low normal systolic function, with an ejection fraction of 50-55%. The cavity size was normal. No evidence of left ventricular regional wall motion abnormalities.   2. The right ventricle has normal systolc  function. The cavity was normal. There is no increase in right ventricular wall thickness.   3. Left atrial size was mildly dilated.   4. Right atrial size was mildly dilated.   5. No evidence of mitral valve stenosis. No mitral valve vegetation visualized.   6. The aortic valve is tricuspid.   7. There is evidence of mild plaque in the descending aorta.   EKG:   06/22/21: EKG was not ordered today  Recent Labs: 03/24/2021: ALT 21; BUN 22; Creatinine, Ser 1.19; Hemoglobin 15.0; Platelets 198.0; Potassium 4.0; Sodium 137; TSH 1.11  Recent Lipid Panel    Component Value Date/Time   CHOL 90 03/24/2021 1100   CHOL 108 04/07/2020 1419   CHOL 206 (H) 12/21/2015 1522   TRIG 81.0 03/24/2021 1100   TRIG 161 (H) 12/21/2015 1522   HDL 42.00 03/24/2021 1100   HDL 48 04/07/2020 1419   HDL 45 12/21/2015 1522   CHOLHDL 2 03/24/2021 1100   VLDL 16.2 03/24/2021 1100   LDLCALC 32 03/24/2021 1100   LDLCALC 41 04/07/2020 1419   LDLCALC 129 12/21/2015 1522     Risk Assessment/Calculations:    CHA2DS2-VASc Score = 3   This indicates a 3.2% annual risk of stroke. The patient's score is based upon: CHF History: 0 HTN History: 1 Diabetes History: 0 Stroke History: 0 Vascular Disease History: 0 Age Score: 2 Gender Score: 0         Physical Exam:    VS:  BP 122/70   Pulse 85   Ht 6' (1.829 m)   Wt 213 lb 3.2 oz (96.7 kg)   SpO2 98%   BMI 28.92 kg/m     Wt Readings from Last 3 Encounters:  06/22/21 213 lb 3.2 oz (96.7 kg)  03/24/21 208 lb 12.8 oz (94.7 kg)  12/23/20 212 lb (96.2 kg)     GEN:  Well nourished, well developed in no acute distress HEENT: Normal NECK: No JVD; No carotid bruits CARDIAC: RRR, no murmurs, rubs, gallops RESPIRATORY:  Clear to auscultation without rales, wheezing or rhonchi  ABDOMEN: Soft, non-tender, non-distended MUSCULOSKELETAL:  No edema; No deformity  SKIN: Warm and dry NEUROLOGIC:  Alert and oriented x 3 PSYCHIATRIC:  Normal affect    ASSESSMENT:    1. Essential hypertension   2. Persistent atrial fibrillation ([place])   3. Aneurysm of ascending aorta without rupture   4. Mixed hyperlipidemia   5. Essential (primary) hypertension    PLAN:    In order of problems listed above:  #Paroxysmal Afib/Flutter: S/p ablation 09/2018 with 0% burden of Afib on ILR. Follwed by Dr. Remus Blake currently; previously followed by Dr. Rayann Heman. CHADs-vasc 3. -Follow-up with EP as scheduled -Planned for ETT with EP -Continue flec 100mg  BID -Continue eliquis for [facility]  #HTN: Controlled. -Continue coreg 6.25mg  BID -Continue HCTZ 25mg  daily  #HLD: -Continue repatha 140mg  q 2weeks  #AAA: Not on ASA due to need for [facility]. On repatha. MRI on 04/07/2021 shows max diameter 31mm which is overall stable. Will need annual monitoring. -Continue annual imaging with MRA/CTA -Continue repatha  -Not on ASA due to need for [facility]  #Exophytic rounded lesion of left kidney: Noted incidentally on MRI. Given history of RCC s/p nephrectomy, will need abdominal ultrasound. Being monitored by Urology. -Follow-up with urology as scheduled      Medication Adjustments/Labs and Tests Ordered: Current medicines are reviewed at length with the patient today.  Concerns regarding medicines are outlined above.  No orders of the defined types were placed in this encounter.  No orders of the defined types were placed in this encounter.   Patient Instructions  Medication Instructions:   Your physician recommends that you continue on your current medications as directed. Please refer to the Current Medication list given to you today.  *If you need a refill on your cardiac medications before your next appointment, please call your pharmacy*    Follow-Up: At [facility], you and your health needs  are our priority.  As part of our continuing mission to provide you with exceptional heart care, we have created designated Provider Care Teams.  These Care Teams  include your primary Cardiologist (physician) and Advanced Practice Providers (APPs -  Physician Assistants and Nurse Practitioners) who all work together to provide you with the care you need, when you need it.  We recommend signing up for the patient portal called "MyChart".  Sign up information is provided on this After Visit Summary.  MyChart is used to connect with patients for Virtual Visits (Telemedicine).  Patients are able to view lab/test results, encounter notes, upcoming appointments, etc.  Non-urgent messages can be sent to your provider as well.   To learn more about what you can do with MyChart, go to [email or web address].    Your next appointment:   1 year(s)  The format for your next appointment:   In Person  Provider:    WITH DR. Reece Leader as a scribe for Freada Bergeron, MD.,have documented all relevant documentation on the behalf of Freada Bergeron, MD,as directed by  Freada Bergeron, MD while in the presence of Freada Bergeron, MD.  I, Freada Bergeron, MD, have reviewed all documentation for this visit. The documentation on 06/22/21 for the exam, diagnosis, procedures, and orders are all accurate and complete.   Signed, Freada Bergeron, MD  06/22/2021 3:06 PM    [place]

## 2021-06-22 NOTE — Addendum Note (Signed)
Addended by: Nuala Alpha on: 06/22/2021 03:16 PM   Modules accepted: Orders

## 2021-06-22 NOTE — Patient Instructions (Signed)
Medication Instructions:   Your physician recommends that you continue on your current medications as directed. Please refer to the Current Medication list given to you today.  *If you need a refill on your cardiac medications before your next appointment, please call your pharmacy*   Testing/Procedures:  MRA OF THE CHEST WITH/ WITHOUT CONTRAST TO BE DONE IN September 2023 PER DR. Johney Frame   Follow-Up: At [facility], you and your health needs are our priority.  As part of our continuing mission to provide you with exceptional heart care, we have created designated Provider Care Teams.  These Care Teams include your primary Cardiologist (physician) and Advanced Practice Providers (APPs -  Physician Assistants and Nurse Practitioners) who all work together to provide you with the care you need, when you need it.  We recommend signing up for the patient portal called "MyChart".  Sign up information is provided on this After Visit Summary.  MyChart is used to connect with patients for Virtual Visits (Telemedicine).  Patients are able to view lab/test results, encounter notes, upcoming appointments, etc.  Non-urgent messages can be sent to your provider as well.   To learn more about what you can do with MyChart, go to [email or web address].    Your next appointment:   1 year(s)  The format for your next appointment:   In Person  Provider:    DR. Johney Frame

## 2021-06-28 DIAGNOSIS — I4819 Other persistent atrial fibrillation: Secondary | ICD-10-CM | POA: Diagnosis not present

## 2021-06-30 ENCOUNTER — Encounter: Payer: Medicare Other | Admitting: Internal Medicine

## 2021-07-01 ENCOUNTER — Encounter: Payer: Self-pay | Admitting: *Deleted

## 2021-07-01 NOTE — Telephone Encounter (Signed)
-----   Message from Armando Gang sent at 06/30/2021 11:45 AM EST ----- Regarding: RE: MRA OF CHEST W/WO CONTRAST TO BE DONE IN SEPT 2023 [name] - he is schedule for 04-08-22 @ 12:00   /labs 04-05-22/ will need med for claustrophobic - he is to bring the med with him @ 11:15   ----- Message ----- From: Nuala Alpha, LPN Sent: 03/50/0938   4:41 PM EST To: Armando Gang, [facility] Subject: FW: MRA OF CHEST W/WO CONTRAST TO BE DONE IN#   ----- Message ----- From: Nuala Alpha, LPN Sent: 18/29/9371   3:18 PM EST To: Nuala Alpha, LPN, Armando Gang, # Subject: MRA OF CHEST W/WO CONTRAST TO BE DONE IN SEP#  Dr. Johney Frame saw this pt in clinic today and added on this order after he left.  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  Order is in as well as BMET.  Can you please call him and schedule this, then shoot me the date when scheduled so I can tell Dr. Johney Frame?  Thanks for all you do, [name]

## 2021-07-01 NOTE — Telephone Encounter (Signed)
Pt aware to notify our office closer to his 04/08/22 MRA test date, to have [place] send in pre-meds for claustrophobia and notify imaging dept that pt request IV Team to be present for MRA for access, due to be a hard stick. Pt is aware to mychart or call us the week of his MRA, so these things can be done.

## 2021-07-07 ENCOUNTER — Other Ambulatory Visit: Payer: Medicare Other

## 2021-07-08 DIAGNOSIS — I4891 Unspecified atrial fibrillation: Secondary | ICD-10-CM | POA: Diagnosis not present

## 2021-07-08 DIAGNOSIS — Z79899 Other long term (current) drug therapy: Secondary | ICD-10-CM | POA: Diagnosis not present

## 2021-07-08 DIAGNOSIS — Z5181 Encounter for therapeutic drug level monitoring: Secondary | ICD-10-CM | POA: Diagnosis not present

## 2021-07-12 DIAGNOSIS — D485 Neoplasm of uncertain behavior of skin: Secondary | ICD-10-CM | POA: Diagnosis not present

## 2021-07-12 DIAGNOSIS — L570 Actinic keratosis: Secondary | ICD-10-CM | POA: Diagnosis not present

## 2021-07-12 DIAGNOSIS — D044 Carcinoma in situ of skin of scalp and neck: Secondary | ICD-10-CM | POA: Diagnosis not present

## 2021-07-12 DIAGNOSIS — Z85828 Personal history of other malignant neoplasm of skin: Secondary | ICD-10-CM | POA: Diagnosis not present

## 2021-07-14 ENCOUNTER — Telehealth: Payer: Self-pay | Admitting: Family Medicine

## 2021-07-14 NOTE — Telephone Encounter (Signed)
Copied from [place] [phone]. Topic: Medicare AWV >> Jul 14, 2021 11:07 AM Harris-Coley, Hannah Beat wrote: Reason for CRM: Left message for patient to schedule Annual Wellness Visit.  Please schedule with Nurse Health Advisor Charlott Rakes, RN at [facility].  Please call [phone] ask for [facility]

## 2021-07-17 ENCOUNTER — Ambulatory Visit
Admission: RE | Admit: 2021-07-17 | Discharge: 2021-07-17 | Disposition: A | Discharge status: Home / Self Care | Payer: Medicare Other | Source: Ambulatory Visit | Attending: Urology | Admitting: Urology

## 2021-07-17 ENCOUNTER — Other Ambulatory Visit: Payer: Self-pay

## 2021-07-17 DIAGNOSIS — Z85528 Personal history of other malignant neoplasm of kidney: Secondary | ICD-10-CM | POA: Diagnosis not present

## 2021-07-17 DIAGNOSIS — N281 Cyst of kidney, acquired: Secondary | ICD-10-CM | POA: Diagnosis not present

## 2021-07-17 DIAGNOSIS — D3002 Benign neoplasm of left kidney: Secondary | ICD-10-CM

## 2021-07-17 DIAGNOSIS — Z905 Acquired absence of kidney: Secondary | ICD-10-CM | POA: Diagnosis not present

## 2021-07-17 MED ORDER — GADOBENATE DIMEGLUMINE 529 MG/ML IV SOLN
20.0000 mL | Freq: Once | INTRAVENOUS | Status: AC | PRN
  Administered 2021-07-17: 20 mL via INTRAVENOUS

## 2021-07-29 DIAGNOSIS — Z4509 Encounter for adjustment and management of other cardiac device: Secondary | ICD-10-CM | POA: Diagnosis not present

## 2021-07-29 DIAGNOSIS — I4891 Unspecified atrial fibrillation: Secondary | ICD-10-CM | POA: Diagnosis not present

## 2021-08-30 DIAGNOSIS — I4891 Unspecified atrial fibrillation: Secondary | ICD-10-CM | POA: Diagnosis not present

## 2021-08-30 DIAGNOSIS — Z4509 Encounter for adjustment and management of other cardiac device: Secondary | ICD-10-CM | POA: Diagnosis not present

## 2021-09-01 DIAGNOSIS — Z1159 Encounter for screening for other viral diseases: Secondary | ICD-10-CM | POA: Diagnosis not present

## 2021-09-01 DIAGNOSIS — B182 Chronic viral hepatitis C: Secondary | ICD-10-CM | POA: Diagnosis not present

## 2021-09-01 DIAGNOSIS — Z5111 Encounter for antineoplastic chemotherapy: Secondary | ICD-10-CM | POA: Diagnosis not present

## 2021-09-16 ENCOUNTER — Encounter: Payer: Self-pay | Admitting: Family Medicine

## 2021-09-17 ENCOUNTER — Other Ambulatory Visit: Payer: Self-pay | Admitting: *Deleted

## 2021-09-17 DIAGNOSIS — Z1211 Encounter for screening for malignant neoplasm of colon: Secondary | ICD-10-CM

## 2021-09-17 NOTE — Telephone Encounter (Signed)
Ok to place referral.  Algis Greenhouse. Jerline Pain, MD 09/17/2021 11:05 AM

## 2021-09-17 NOTE — Telephone Encounter (Signed)
Referral placed.

## 2021-09-17 NOTE — Telephone Encounter (Signed)
See note

## 2021-09-21 NOTE — Telephone Encounter (Signed)
Send information to Lisa referral coordinator  ?

## 2021-09-28 ENCOUNTER — Encounter: Payer: Self-pay | Admitting: Family Medicine

## 2021-09-28 ENCOUNTER — Ambulatory Visit (INDEPENDENT_AMBULATORY_CARE_PROVIDER_SITE_OTHER): Payer: Medicare Other | Admitting: Family Medicine

## 2021-09-28 ENCOUNTER — Other Ambulatory Visit: Payer: Self-pay

## 2021-09-28 VITALS — BP 110/69 | HR 61 | Temp 98.2°F | Ht 72.0 in | Wt 211.6 lb

## 2021-09-28 DIAGNOSIS — G629 Polyneuropathy, unspecified: Secondary | ICD-10-CM | POA: Diagnosis not present

## 2021-09-28 DIAGNOSIS — E1159 Type 2 diabetes mellitus with other circulatory complications: Secondary | ICD-10-CM | POA: Diagnosis not present

## 2021-09-28 DIAGNOSIS — Z1211 Encounter for screening for malignant neoplasm of colon: Secondary | ICD-10-CM

## 2021-09-28 DIAGNOSIS — E119 Type 2 diabetes mellitus without complications: Secondary | ICD-10-CM | POA: Diagnosis not present

## 2021-09-28 DIAGNOSIS — K7469 Other cirrhosis of liver: Secondary | ICD-10-CM

## 2021-09-28 DIAGNOSIS — B1920 Unspecified viral hepatitis C without hepatic coma: Secondary | ICD-10-CM

## 2021-09-28 DIAGNOSIS — I152 Hypertension secondary to endocrine disorders: Secondary | ICD-10-CM

## 2021-09-28 LAB — POCT GLYCOSYLATED HEMOGLOBIN (HGB A1C): Hemoglobin A1C: 6.7 % — AB (ref 4.0–5.6)

## 2021-09-28 NOTE — Assessment & Plan Note (Signed)
His previous GI physician at [facility] has retired. Will place referral to new GI.  He will need updated EGD and screening colonoscopy.

## 2021-09-28 NOTE — Assessment & Plan Note (Signed)
A1c well controlled 6.7 off medications.  Managing sugar with lifestyle modifications.  Recheck in 6 months.

## 2021-09-28 NOTE — Assessment & Plan Note (Signed)
At goal on coreg 6.25mg  twice daily and HCTZ 25mg  daily.

## 2021-09-28 NOTE — Assessment & Plan Note (Signed)
No red flags. Doubt this is diabetes related given his typical well control. Discussed further work up with imaging, nerve conduction studies, etc however patient deferred. Will continue with watchful waiting. Discussed reasons to care.

## 2021-09-28 NOTE — Patient Instructions (Addendum)
It was very nice to see you today!  Your A1c is 6.7.  No medication changes today.   Come back in 6 months for your annual check up with labs.   Come back sooner if needed.   Take care, Dr Jerline Pain  PLEASE NOTE:  If you had any lab tests please let us know if you have not heard back within a few days. You may see your results on mychart before we have a chance to review them but we will give you a call once they are reviewed by [place]. If we ordered any referrals today, please let us know if you have not heard from their office within the next week.   Please try these tips to maintain a healthy lifestyle:  Eat at least 3 REAL meals and 1-2 snacks per day.  Aim for no more than 5 hours between eating.  If you eat breakfast, please do so within one hour of getting up.   Each meal should contain half fruits/vegetables, one quarter protein, and one quarter carbs (no bigger than a computer mouse)  Cut down on sweet beverages. This includes juice, soda, and sweet tea.   Drink at least 1 glass of water with each meal and aim for at least 8 glasses per day  Exercise at least 150 minutes every week.

## 2021-09-28 NOTE — Progress Notes (Signed)
° °  Timothy Dickson is a 76 y.o. male who presents today for an office visit.  Assessment/Plan:  Chronic Problems Addressed Today: Hypertension associated with diabetes ([place]) At goal on coreg 6.25mg  twice daily and HCTZ 25mg  daily.  Neuropathy No red flags. Doubt this is diabetes related given his typical well control. Discussed further work up with imaging, nerve conduction studies, etc however patient deferred. Will continue with watchful waiting. Discussed reasons to care.   Controlled type 2 diabetes mellitus without complication, without long-term current use of insulin (HCC) A1c well controlled 6.7 off medications.  Managing sugar with lifestyle modifications.  Recheck in 6 months.  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 Will place referral to new GI.  He will need updated EGD and screening colonoscopy.    Subjective:  HPI:  Patient here to 6 months follow up on diabetes mellitus.   His A1c was 6.7 at office today. He has been managing his diabetes through diet. He is currently not on any medication. He has been trying to cut down on sugar. He likes to do bicycling and gardening. No reported fatigue. Denies any hypoglycemic events. Denies chest pain or shortness of breath.  Additionally, he has been feeling numbness and tingling sensation. He notes he feel hot sometimes. He described this "crawling" feeling. He is concerned that it might be neuropathy. He denies any burning pain in the feet. No difficulty walking or weakness.  He is requesting referral to colonoscopy. His last colonoscopy was about 10 years ago. Had some polyps but otherwise was normal.        Objective:  Physical Exam: BP 110/69 (BP Location: Left Arm)    Pulse 61    Temp 98.2 F (36.8 C) (Temporal)    Ht 6' (1.829 m)    Wt 211 lb 9.6 oz (96 kg)    SpO2 96%    BMI 28.70 kg/m   Gen: No acute distress, resting comfortably CV: Regular rate  and rhythm with no murmurs appreciated Pulm: Normal work of breathing, clear to auscultation bilaterally with no crackles, wheezes, or rhonchi Neuro: Grossly normal, moves all extremities Psych: Normal affect and thought content       I,Savera Zaman,acting as a scribe for Dimas Chyle, MD.,have documented all relevant documentation on the behalf of Dimas Chyle, MD,as directed by  Dimas Chyle, MD while in the presence of Dimas Chyle, MD.   I, Dimas Chyle, MD, have reviewed all documentation for this visit. The documentation on 09/28/21 for the exam, diagnosis, procedures, and orders are all accurate and complete.  Algis Greenhouse. Jerline Pain, MD 09/28/2021 2:40 PM

## 2021-09-30 DIAGNOSIS — Z4509 Encounter for adjustment and management of other cardiac device: Secondary | ICD-10-CM | POA: Diagnosis not present

## 2021-09-30 DIAGNOSIS — I4891 Unspecified atrial fibrillation: Secondary | ICD-10-CM | POA: Diagnosis not present

## 2021-10-01 DIAGNOSIS — Z4509 Encounter for adjustment and management of other cardiac device: Secondary | ICD-10-CM | POA: Diagnosis not present

## 2021-10-05 ENCOUNTER — Telehealth: Payer: Self-pay | Admitting: Family Medicine

## 2021-10-05 NOTE — Telephone Encounter (Signed)
Copied from [place] [phone]. Topic: Medicare AWV ?>> Oct 05, 2021  9:49 AM Harris-Coley, Hannah Beat wrote: ?Reason for CRM: Left message for patient to schedule Annual Wellness Visit.  Please schedule with Nurse Health Advisor Charlott Rakes, RN at [facility].  Please call [phone] ask for Juliann Pulse ?

## 2021-10-11 ENCOUNTER — Encounter: Payer: Self-pay | Admitting: Internal Medicine

## 2021-10-11 ENCOUNTER — Telehealth: Payer: Self-pay | Admitting: Gastroenterology

## 2021-10-11 NOTE — Telephone Encounter (Signed)
Good afternoon Dr. Havery Moros, ? ?This patient called requesting a transfer of care to you.  His records are in Epic for your review.  He said he'd like to make this transfer due to the convenience of our office, as it is only three blocks from his house and much more convenient for him.  Please let me know if you approve the transfer. ? ?Thank you. ?

## 2021-10-11 NOTE — Telephone Encounter (Signed)
Happy to see him if he wants to establish care with [place].  Thanks ?

## 2021-10-12 ENCOUNTER — Encounter: Payer: Self-pay | Admitting: Cardiology

## 2021-10-12 ENCOUNTER — Encounter: Payer: Self-pay | Admitting: Gastroenterology

## 2021-10-12 DIAGNOSIS — H5203 Hypermetropia, bilateral: Secondary | ICD-10-CM | POA: Diagnosis not present

## 2021-10-12 LAB — HM DIABETES EYE EXAM

## 2021-10-14 ENCOUNTER — Encounter: Payer: Self-pay | Admitting: Internal Medicine

## 2021-10-19 NOTE — Telephone Encounter (Signed)
He requested to see Dr. Lovena Le because of the letter ?

## 2021-10-20 ENCOUNTER — Encounter: Payer: Self-pay | Admitting: Family Medicine

## 2021-10-21 ENCOUNTER — Encounter: Payer: Self-pay | Admitting: Cardiology

## 2021-10-22 ENCOUNTER — Ambulatory Visit: Payer: Medicare Other | Admitting: Physician Assistant

## 2021-10-22 ENCOUNTER — Encounter: Payer: Self-pay | Admitting: Physician Assistant

## 2021-10-22 ENCOUNTER — Other Ambulatory Visit: Payer: Self-pay

## 2021-10-22 ENCOUNTER — Encounter: Payer: Self-pay | Admitting: Internal Medicine

## 2021-10-22 VITALS — BP 114/64 | HR 74 | Ht 72.0 in | Wt 212.8 lb

## 2021-10-22 DIAGNOSIS — I4819 Other persistent atrial fibrillation: Secondary | ICD-10-CM

## 2021-10-22 DIAGNOSIS — Z5181 Encounter for therapeutic drug level monitoring: Secondary | ICD-10-CM

## 2021-10-22 DIAGNOSIS — H539 Unspecified visual disturbance: Secondary | ICD-10-CM

## 2021-10-22 DIAGNOSIS — Z79899 Other long term (current) drug therapy: Secondary | ICD-10-CM

## 2021-10-22 DIAGNOSIS — I10 Essential (primary) hypertension: Secondary | ICD-10-CM | POA: Diagnosis not present

## 2021-10-22 LAB — BASIC METABOLIC PANEL
BUN/Creatinine Ratio: 19 (ref 10–24)
BUN: 22 mg/dL (ref 8–27)
CO2: 25 mmol/L (ref 20–29)
Calcium: 9.6 mg/dL (ref 8.6–10.2)
Chloride: 95 mmol/L — ABNORMAL LOW (ref 96–106)
Creatinine, Ser: 1.15 mg/dL (ref 0.76–1.27)
Glucose: 185 mg/dL — ABNORMAL HIGH (ref 70–99)
Potassium: 4.1 mmol/L (ref 3.5–5.2)
Sodium: 136 mmol/L (ref 134–144)
eGFR: 66 mL/min/{1.73_m2} (ref >59)

## 2021-10-22 LAB — CUP PACEART INCLINIC DEVICE CHECK
Date Time Interrogation Session: 20230324121301
Implantable Pulse Generator Implant Date: 20210819

## 2021-10-22 LAB — SEDIMENTATION RATE: Sed Rate: 3 mm/hr (ref 0–30)

## 2021-10-22 NOTE — Progress Notes (Signed)
? ?Cardiology Office Note ?Date:  10/22/2021  ?Patient ID:  Timothy Dickson, Timothy Dickson 1946/01/02, MRN [id number] ?PCP:  [name], MD  ?Cardiologist:  Dr. Johney Frame ?Electrophysiologist: Dr Rayann Heman >> Dr. Lovena Le ? ?  ?Chief Complaint: re-establish EP care and loop management ? ?History of Present Illness: ?IZEYAH Dickson is a 76 y.o. male with history of Hep C, HTN, GERD, SVT, AFib, RCC (s/p R nephrectomy), DM. ? ?He comes in today to be seen for dr. Lovena Le, previously followed by Dr. Rayann Heman, last seen by him May 2022, at that time, no symptoms, reported that he had tried to again stop flecainide though felt some brief arrhythmia and resumed. ?Dose was reduced to '50mg'$  BID with essentially 0% burden on his loop ?Eliquis continued. ? ?Subsequently he transferred Ep care to Dr. Remus Blake at [facility], discussed perhaps [place] or repeat ablation oif burden increased. ? ?He saw Dr. Johney Frame Nov 2022, doing well, walking the dogs, riding bike,  reported planned for EST at [facility].  Was pending urology follow up for a lesion on his L kidney ? ?07/08/21: EST done, mentioned RBBB on his resting EKG ? ?The patient reached out with some symptoms, numerous back/forth my chart messages, seems with some frustration about symptoms, lack of quick access perhaps, unclear why he could not or did not see his EP team at [facility], ultimately requesting to resume his EP care with [place], requested Dr. Lovena Le. ? ?TODAY ?Generally he feels well. ?No symptoms of Afib back on '100mg'$  BID of Flecainide since his AFib back in May 2022 ?NO CP, palpitations ?No SOB ?No dizzy spells, near syncope or syncope. ?No bleeding or signs of bleeding ? ?He notes in the evening his HR gets into the 50's ? ?His primary concern in in the last 2-3 days (evenings) he has developed a strang flickering in his vision that is in conjunction with his pulse. ?It has only happened when reclined to some degree, in bed, reading. ?This Am again happened, and he adjusted himself in  bed, sitting more upright and resolved, but the flickering in his vision is for certain with his pulse.  He does not feel like it is irregular, or fats, checks his BP and HR and both are and have been normal. ?He has not had tunnel vision, no headache, no trauma ?No focal weaknesses. ? ?He would like to resume his EP care here, and with Dr. Lovena Le ? ? ?Device information ?MDT LINQ II implanted 03/19/2020 ? 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 ?Past Medical History:  ?Diagnosis Date  ? Atrial tachycardia ([place])   ? documented by holter 2016. Followed by Dr. Liane Comber and Dr. Rayann Heman.  ? Cancer of kidney ([place])   ? right kidney-removed. no further tx other than surgery [facility].  ? Cirrhosis ([place])   ? [facility] dx, early stage- follow up visits at [facility].  ? CRI (chronic renal insufficiency)   ? s/p R nephrectomy in 1999 for malignancy  ? Diabetes mellitus without complication ([place])   ? NIDDM- dx. 3-4 years ago diet control  ? Diverticulosis of colon   ? mild  ? GERD (gastroesophageal reflux disease)   ? Headache(784.0)   ? Hepatitis C   ? s/p therapy [facility] (curative)/with Harvoni  ? History of BPH   ? no recent issues  ? History of substance  abuse ([place])   ? past history -none in 38 yrs  ? Hyperglycemia   ? Hypertension   ? Osteoarthritis, knee   ? knees bilaterally   ? Pneumonia 1975  ? hx of 35 years ago  ? ? ?Past Surgical History:  ?Procedure Laterality Date  ? Arthroscopy right knee    ? may '11 (Dr Alvan Dame)  ? Arthroscopy, left knee,    ? ATRIAL FIBRILLATION ABLATION N/A 10/11/2018  ? Procedure: ATRIAL FIBRILLATION ABLATION;  Surgeon: Thompson Grayer, MD;  Location: [place] [facility];  Service: Cardiovascular;  Laterality: N/A;  ? ATRIAL FIBRILLATION ABLATION N/A 06/19/2020  ? Procedure: ATRIAL FIBRILLATION ABLATION;  Surgeon:  Thompson Grayer, MD;  Location: [place] [facility];  Service: Cardiovascular;  Laterality: N/A;  ? CARDIAC ELECTROPHYSIOLOGY MAPPING AND ABLATION    ? history of liver biopsy  2007  ? History of Nephrectomy Right 1999  ? implantable loop recorder placement  03/19/2020  ?  Medtronic Reveal Linq model LNQ 22 (RLB [id number] ) implantable loop recorder implanted by Dr Rayann Heman for evaluation of palpitations and afib management post ablation  ? KNEE ARTHROSCOPY Left 10/10/2012  ? Procedure: LEFT KNEE ARTHROSCOPY WITH MENSCIAL DEBRIDEMENT AND CHONDROPLASTY;  Surgeon: Gearlean Alf, MD;  [facility]: [facility];  Service: Orthopedics;  Laterality: Left;  ? left shoulder repair for bone spur  1990's  ? TEE WITHOUT CARDIOVERSION N/A 10/11/2018  ? Procedure: TRANSESOPHAGEAL ECHOCARDIOGRAM (TEE);  Surgeon: Buford Dresser, MD;  Location: [facility] ENDOSCOPY;  Service: Cardiovascular;  Laterality: N/A;  ablation to follow at 1030  ? TOTAL KNEE ARTHROPLASTY Right 08/15/2016  ? Procedure: RIGHT TOTAL KNEE ARTHROPLASTY, CORTISONE INJECTION OF LEFT KNEE;  Surgeon: Gaynelle Arabian, MD;  Location: [facility];  Service: Orthopedics;  Laterality: Right;  ? TOTAL KNEE ARTHROPLASTY Left 09/04/2017  ? Procedure: LEFT TOTAL KNEE ARTHROPLASTY;  Surgeon: Gaynelle Arabian, MD;  [facility];  Service: Orthopedics;  Laterality: Left;  ? Transurethral needle ablation procedure    ? TUNA procedure    ? ? ?Current Outpatient Medications  ?Medication Sig Dispense Refill  ? acetaminophen (TYLENOL) 650 MG CR tablet Take 650 mg by mouth See admin instructions. Take 650 mg at 11 am and take 650 mg at 4-5 am (take with gabapentin)    ? allopurinol (ZYLOPRIM) 100 MG tablet Take 1 tablet (100 mg total) by mouth daily. 90 tablet 3  ? Aloe-Sodium Chloride (AYR SALINE NASAL GEL NA) Place 1 application into the nose at bedtime.    ? carvedilol (COREG) 6.25 MG tablet Take 1 tablet (6.25 mg total) by mouth 2 (two) times daily. 60 tablet 11  ? ELIQUIS 5 MG TABS tablet TAKE 1  TABLET BY MOUTH TWICE DAILY. 60 tablet 10  ? Evolocumab (REPATHA SURECLICK) 332 MG/ML SOAJ INJECT CONTENTS OF 1 PEN SUBCUTANEOUSLY EVERY 14 DAYS. 2 mL 11  ? famotidine (PEPCID) 20 MG tablet Take 20 mg by mouth daily as needed for heartburn.     ? flecainide (TAMBOCOR) 100 MG tablet Take 100 mg by mouth 2 (two) times daily.    ? gabapentin (NEURONTIN) 800 MG tablet TAKE ONE TABLET BY MOUTH TWICE DAILY. (Patient taking differently: 800 mg 2 (two) times daily.) 180 tablet 1  ? hydrochlorothiazide (HYDRODIURIL) 25 MG tablet Take 1 tablet (25 mg total) by mouth daily. 90 tablet 0  ? Multiple Vitamin (MULTIVITAMIN) capsule Take 1 capsule by mouth daily.    ? omeprazole (PRILOSEC) 20 MG capsule Take 20 mg by mouth every other day.     ?  sodium chloride (OCEAN) 0.65 % SOLN nasal spray Place 1 spray into both nostrils daily.    ? ?No current facility-administered medications for this visit.  ? ? ?Allergies:   Tramadol hcl, Acetaminophen, Nsaids, and Pravastatin  ? ?Social History:  The patient  reports that he quit smoking about 44 years ago. His smoking use included cigarettes. He has a 15.00 pack-year smoking history. He has never used smokeless tobacco. He reports that he does not drink alcohol and does not use drugs.  ? ?Family History:  The patient's family history includes Alcohol abuse in his father, sister, and another family member; Breast cancer in his sister; CVA in his mother; Cancer in his brother; Coronary artery disease in his mother; Diabetes in his sister; Esophageal cancer in his sister; Lung cancer in his sister; Prostate cancer in his brother. ? ?ROS:  Please see the history of present illness.    ?All other systems are reviewed and otherwise negative.  ? ?PHYSICAL EXAM:  ?VS:  BP 114/64   Pulse 74   Ht 6' (1.829 m)   Wt 212 lb 12.8 oz (96.5 kg)   SpO2 91%   BMI 28.86 kg/m?  BMI: Body mass index is 28.86 kg/m?. ?Well nourished, well developed, in no acute distress ?HEENT: normocephalic,  atraumatic ?Neck: no JVD, carotid bruits or masses ?Cardiac:  RRR; no significant murmurs, no rubs, or gallops ?Lungs:  CTA b/l, no wheezing, rhonchi or rales ?Abd: soft, nontender ?MS: no deformity or atrophy ?Ext:  no edem

## 2021-10-22 NOTE — Patient Instructions (Addendum)
Medication Instructions:  ? ?Your physician recommends that you continue on your current medications as directed. Please refer to the Current Medication list given to you today. ? ?*If you need a refill on your cardiac medications before your next appointment, please call your pharmacy* ? ? ?Lab Work:  BMET AND  [place]  ? ?If you have labs (blood work) drawn today and your tests are completely normal, you will receive your results only by: ?MyChart Message (if you have MyChart) OR ?A paper copy in the mail ?If you have any lab test that is abnormal or we need to change your treatment, we will call you to review the results. ? ? ?Testing/Procedures: Non-Cardiac CT Angiography (CTA), is a special type of CT scan that uses a computer to produce multi-dimensional views of major blood vessels throughout the body. In CT angiography, a contrast material is injected through an IV to help visualize the blood vessels ? ? ?Follow-Up: ?At [facility], you and your health needs are our priority.  As part of our continuing mission to provide you with exceptional heart care, we have created designated Provider Care Teams.  These Care Teams include your primary Cardiologist (physician) and Advanced Practice Providers (APPs -  Physician Assistants and Nurse Practitioners) who all work together to provide you with the care you need, when you need it. ? ?We recommend signing up for the patient portal called "MyChart".  Sign up information is provided on this After Visit Summary.  MyChart is used to connect with patients for Virtual Visits (Telemedicine).  Patients are able to view lab/test results, encounter notes, upcoming appointments, etc.  Non-urgent messages can be sent to your provider as well.   ?To learn more about what you can do with MyChart, go to [email or web address].   ? ?Your next appointment:    AS SCHEDULED  ? ? ?Other Instructions ? ?

## 2021-10-23 ENCOUNTER — Other Ambulatory Visit: Payer: Self-pay | Admitting: Family Medicine

## 2021-10-25 NOTE — Addendum Note (Signed)
Addended by: Willeen Cass A on: 10/25/2021 07:09 AM ? ? Modules accepted: Orders ? ?

## 2021-10-26 DIAGNOSIS — B0089 Other herpesviral infection: Secondary | ICD-10-CM | POA: Diagnosis not present

## 2021-10-26 DIAGNOSIS — Z85828 Personal history of other malignant neoplasm of skin: Secondary | ICD-10-CM | POA: Diagnosis not present

## 2021-10-26 DIAGNOSIS — N281 Cyst of kidney, acquired: Secondary | ICD-10-CM | POA: Diagnosis not present

## 2021-10-26 DIAGNOSIS — L570 Actinic keratosis: Secondary | ICD-10-CM | POA: Diagnosis not present

## 2021-10-28 ENCOUNTER — Ambulatory Visit (INDEPENDENT_AMBULATORY_CARE_PROVIDER_SITE_OTHER)
Admission: RE | Admit: 2021-10-28 | Discharge: 2021-10-28 | Disposition: A | Discharge status: Home / Self Care | Payer: Medicare Other | Source: Ambulatory Visit | Attending: Physician Assistant | Admitting: Physician Assistant

## 2021-10-28 ENCOUNTER — Ambulatory Visit
Admission: RE | Admit: 2021-10-28 | Discharge: 2021-10-28 | Disposition: A | Discharge status: Home / Self Care | Payer: Medicare Other | Source: Ambulatory Visit | Attending: Physician Assistant | Admitting: Physician Assistant

## 2021-10-28 ENCOUNTER — Encounter: Payer: Self-pay | Admitting: Family Medicine

## 2021-10-28 DIAGNOSIS — I6503 Occlusion and stenosis of bilateral vertebral arteries: Secondary | ICD-10-CM | POA: Diagnosis not present

## 2021-10-28 DIAGNOSIS — H539 Unspecified visual disturbance: Secondary | ICD-10-CM | POA: Diagnosis not present

## 2021-10-28 DIAGNOSIS — I4891 Unspecified atrial fibrillation: Secondary | ICD-10-CM | POA: Diagnosis not present

## 2021-10-28 DIAGNOSIS — M4802 Spinal stenosis, cervical region: Secondary | ICD-10-CM | POA: Diagnosis not present

## 2021-10-28 DIAGNOSIS — Q283 Other malformations of cerebral vessels: Secondary | ICD-10-CM | POA: Diagnosis not present

## 2021-10-28 DIAGNOSIS — I6523 Occlusion and stenosis of bilateral carotid arteries: Secondary | ICD-10-CM | POA: Diagnosis not present

## 2021-10-28 MED ORDER — IOHEXOL 350 MG/ML SOLN
75.0000 mL | Freq: Once | INTRAVENOUS | Status: AC | PRN
  Administered 2021-10-28: 75 mL via INTRAVENOUS

## 2021-10-29 ENCOUNTER — Telehealth: Payer: Self-pay | Admitting: Internal Medicine

## 2021-10-29 ENCOUNTER — Telehealth: Payer: Self-pay | Admitting: Family Medicine

## 2021-10-29 ENCOUNTER — Other Ambulatory Visit: Payer: Self-pay

## 2021-10-29 DIAGNOSIS — I771 Stricture of artery: Secondary | ICD-10-CM

## 2021-10-29 NOTE — Telephone Encounter (Signed)
Referral to [facility] [facility] was placed and patient was notified via [place] and advised to give them 7-10 business days to call to schedule. Advised pt of their contact information as well.  ?

## 2021-10-29 NOTE — Telephone Encounter (Signed)
Please see note and advise  

## 2021-10-29 NOTE — Telephone Encounter (Signed)
Pt had a CT of his head & neck which was ordered by Dr. Rayann Heman, who is out of town. Pt is concerned and is asking to have Dr Jerline Pain to look at the CT to make sure there are no immediate issues. Pt is leaving town on 4/3.  ?

## 2021-10-29 NOTE — Telephone Encounter (Signed)
Ok to place referral.

## 2021-10-29 NOTE — Telephone Encounter (Signed)
Sent MyChart message to Pt advising Dr. Lovena Le is not available today. ? ?Will have him review next week and will notify Pt of recommendations. ? ?

## 2021-10-29 NOTE — Telephone Encounter (Signed)
Patient calling in with questions/concerning regarding his results. Please advise ?

## 2021-10-29 NOTE — Telephone Encounter (Signed)
See mychart message. ? ?Timothy Dickson. Jerline Pain, MD ?10/29/2021 2:57 PM  ? ?

## 2021-10-29 NOTE — Telephone Encounter (Signed)
Referral placed and pt notified

## 2021-10-29 NOTE — Telephone Encounter (Signed)
It looks like he has moderate to severe narrowing in some of the arteries in the back of his neck. We do not need to do anything urgently but it would probably be a good idea for him to see vascular surgery. ? ?Timothy Dickson. Jerline Pain, MD ?10/29/2021 2:31 PM  ? ?

## 2021-10-29 NOTE — Telephone Encounter (Signed)
Ok to place referral. ? ?Algis Greenhouse. Jerline Pain, MD ?10/29/2021 2:57 PM  ? ?

## 2021-11-01 DIAGNOSIS — Z95818 Presence of other cardiac implants and grafts: Secondary | ICD-10-CM | POA: Diagnosis not present

## 2021-11-01 DIAGNOSIS — I4891 Unspecified atrial fibrillation: Secondary | ICD-10-CM | POA: Diagnosis not present

## 2021-11-04 ENCOUNTER — Telehealth: Payer: Self-pay | Admitting: Physician Assistant

## 2021-11-04 DIAGNOSIS — Z4509 Encounter for adjustment and management of other cardiac device: Secondary | ICD-10-CM | POA: Diagnosis not present

## 2021-11-04 NOTE — Telephone Encounter (Signed)
Patient returned your call.

## 2021-11-11 ENCOUNTER — Encounter: Payer: Self-pay | Admitting: Internal Medicine

## 2021-11-15 ENCOUNTER — Encounter: Payer: Medicare Other | Admitting: Physician Assistant

## 2021-11-23 ENCOUNTER — Encounter: Payer: Self-pay | Admitting: Gastroenterology

## 2021-11-23 ENCOUNTER — Ambulatory Visit: Payer: Medicare Other | Admitting: Gastroenterology

## 2021-11-23 VITALS — BP 104/62 | HR 64 | Ht 72.0 in | Wt 212.1 lb

## 2021-11-23 DIAGNOSIS — Z1211 Encounter for screening for malignant neoplasm of colon: Secondary | ICD-10-CM

## 2021-11-23 DIAGNOSIS — K219 Gastro-esophageal reflux disease without esophagitis: Secondary | ICD-10-CM

## 2021-11-23 DIAGNOSIS — Z8619 Personal history of other infectious and parasitic diseases: Secondary | ICD-10-CM

## 2021-11-23 DIAGNOSIS — Z7901 Long term (current) use of anticoagulants: Secondary | ICD-10-CM | POA: Diagnosis not present

## 2021-11-23 DIAGNOSIS — K7460 Unspecified cirrhosis of liver: Secondary | ICD-10-CM | POA: Diagnosis not present

## 2021-11-23 NOTE — Progress Notes (Signed)
? ?HPI :  ?76 year old male with a history of hepatitis C associated cirrhosis, history of renal neoplasm, GERD, history of atrial fibrillation status post ablation, on chronic flecainide and Eliquis, referred by Dr. Dimas Chyle to establish care for cirrhosis of the liver and colonoscopy screening. ? ?Patient states he had a history of hepatitis C for years.  He reports he was treated for this eventually at [facility] around 2014/2015 with Harvoni.  He has had eradication of it at that time and no recurrence.  He endorses having compensated cirrhosis over the years.  He denies any jaundice, no ascites, no esophageal varices, no hepatic encephalopathy.  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  He has had an ultrasound of his liver last in November 2022 showing cirrhosis of the liver.  Interestingly he had an MRI of his abdomen in December 2022 to follow-up his renal neoplasm, his liver was reportedly normal on that imaging.  Platelets have been normal, no anemia. ? ?He has a history of atrial fibrillation, had ablation therapy in March 2020 and November 2021.  States unfortunately this did not work and he has been on flecainide since that time.  He is also been on chronic Eliquis.  As part of his management he is also on carvedilol which she has been on for some time.  He states he last had an EGD 3 years ago which showed no evidence of esophageal varices.  He denies any history of Barrett's esophagus etc. as well.  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  He is in remission of that. ? ?He endorses a history of GERD.  Currently takes omeprazole 20 mg on average every other day.  This controls his symptoms pretty well, denies any breakthrough.  If he has any breakthrough he might take Pepcid but is not frequent.  Denies any dysphagia. ? ?His last colonoscopy was in April 2014, he had some diverticulosis but no polyps.  No family history of colon cancer.   Denies any problems with his bowels that bother him.  No blood in the stools.  He inquires about the timing of his next colonoscopy.  He currently denies any cardiopulmonary symptoms and is feeling well.  He had labs in February showing normal liver function testing, normal blood counts, normal platelets. AFP normal. ? ?Prior workup: ?EGD 10/2012: Dr. Earlean Shawl - normal ? ?Colonoscopy 10/2012: - Dr. Earlean Shawl - diverticulosis, otherwise normal ? ? ?MRI abdomen 07/19/21 - normal liver?IMPRESSION: ?Benign Bosniak category 1 left renal cysts, corresponding to lesion ?seen on recent chest MRA. No evidence of left renal neoplasm or ?hydronephrosis. ?  ?Prior right nephrectomy. No evidence of recurrent or metastatic ?disease within the abdomen. ? ?Echo 10/11/18 - EF 50-55% ? ?Exercise stress 07/08/21 - [facility] ? ?Past Medical History:  ?Diagnosis Date  ? Atrial fibrillation ([place]) 2019  ? Atrial tachycardia ([place])   ? documented by holter 2016. Followed by Dr. Liane Comber and Dr. Rayann Heman.  ? Cancer of kidney ([place])   ? right kidney-removed. no further tx other than surgery [facility].  ? Cirrhosis ([place])   ? [facility] dx, early stage- follow up visits at [facility].  ? CRI (chronic renal insufficiency)   ? s/p R nephrectomy in 1999 for malignancy  ? Diabetes mellitus without complication ([place])   ? NIDDM- dx. 3-4 years ago diet control  ? Diverticulosis of colon   ? mild  ? GERD (gastroesophageal reflux disease)   ?  Headache(784.0)   ? Hepatitis C   ? s/p therapy [facility] (curative)/with Harvoni  ? History of BPH   ? no recent issues  ? History of substance abuse ([place])   ? past history -none in 38 yrs  ? Hyperglycemia   ? Hyperlipemia   ? Hypertension   ? Osteoarthritis, knee   ? knees bilaterally   ? Pneumonia 1975  ? hx of 35 years ago  ? ? ? ?Past Surgical History:  ?Procedure Laterality Date  ? Arthroscopy right knee    ? may '11 (Dr Alvan Dame)  ? Arthroscopy, left knee,    ? ATRIAL FIBRILLATION ABLATION N/A 10/11/2018  ? Procedure: ATRIAL FIBRILLATION ABLATION;   Surgeon: Thompson Grayer, MD;  Location: [place] [facility];  Service: Cardiovascular;  Laterality: N/A;  ? ATRIAL FIBRILLATION ABLATION N/A 06/19/2020  ? Procedure: ATRIAL FIBRILLATION ABLATION;  Surgeon: Thompson Grayer, MD;  Location: [place] [facility];  Service: Cardiovascular;  Laterality: N/A;  ? CARDIAC ELECTROPHYSIOLOGY MAPPING AND ABLATION    ? history of liver biopsy  2007  ? History of Nephrectomy Right 1999  ? implantable loop recorder placement  03/19/2020  ?  Medtronic Reveal Linq model LNQ 22 (RLB [id number] ) implantable loop recorder implanted by Dr Rayann Heman for evaluation of palpitations and afib management post ablation  ? KNEE ARTHROSCOPY Left 10/10/2012  ? Procedure: LEFT KNEE ARTHROSCOPY WITH MENSCIAL DEBRIDEMENT AND CHONDROPLASTY;  Surgeon: Gearlean Alf, MD;  [facility];  Service: Orthopedics;  Laterality: Left;  ? left shoulder repair for bone spur  1990's  ? TEE WITHOUT CARDIOVERSION N/A 10/11/2018  ? Procedure: TRANSESOPHAGEAL ECHOCARDIOGRAM (TEE);  Surgeon: Buford Dresser, MD;  Location: [facility] ENDOSCOPY;  Service: Cardiovascular;  Laterality: N/A;  ablation to follow at 1030  ? TOTAL KNEE ARTHROPLASTY Right 08/15/2016  ? Procedure: RIGHT TOTAL KNEE ARTHROPLASTY, CORTISONE INJECTION OF LEFT KNEE;  Surgeon: Gaynelle Arabian, MD;  [facility];  Service: Orthopedics;  Laterality: Right;  ? TOTAL KNEE ARTHROPLASTY Left 09/04/2017  ? Procedure: LEFT TOTAL KNEE ARTHROPLASTY;  Surgeon: Gaynelle Arabian, MD;  [facility];  Service: Orthopedics;  Laterality: Left;  ? Transurethral needle ablation procedure    ? TUNA procedure    ? ?Family History  ?Problem Relation Age of Onset  ? Coronary artery disease Mother   ? CVA Mother   ? Alcohol abuse Father   ?     variceal hemorrhage  ? Liver disease Father   ?     alcohol related  ? Alcohol abuse Sister   ? Diabetes Sister   ? Breast cancer Sister   ? Lung cancer Sister   ? Esophageal cancer Sister   ? Cancer Brother   ?     head and neck  ?  Alcohol abuse Other   ?     Strong family Hx  ? Colon cancer Neg Hx   ? Pancreatic cancer Neg Hx   ? ?Social History  ? ?Tobacco Use  ? Smoking status: Former  ?  Packs/day: 1.00  ?  Years: 15.00  ?  Pack years: 15.00  ?  Types: Cigarettes  ?  Quit date: 08/01/1977  ?  Years since quitting: 44.3  ? Smokeless tobacco: Never  ?Vaping Use  ? Vaping Use: Never used  ?Substance Use Topics  ? Alcohol use: No  ?  Comment: past history -none in 38 yrs  ? Drug use: No  ?  Comment: past hx none in 38 yrs  ? ?  Current Outpatient Medications  ?Medication Sig Dispense Refill  ? acetaminophen (TYLENOL) 650 MG CR tablet Take 650 mg by mouth See admin instructions. Take 650 mg at 11 am and take 650 mg at 4-5 am (take with gabapentin)    ? allopurinol (ZYLOPRIM) 100 MG tablet TAKE ONE TABLET BY MOUTH DAILY 90 tablet 3  ? Aloe-Sodium Chloride (AYR SALINE NASAL GEL NA) Place 1 application into the nose at bedtime.    ? carvedilol (COREG) 6.25 MG tablet Take 1 tablet (6.25 mg total) by mouth 2 (two) times daily. 60 tablet 11  ? ELIQUIS 5 MG TABS tablet TAKE 1 TABLET BY MOUTH TWICE DAILY. 60 tablet 10  ? Evolocumab (REPATHA SURECLICK) 940 MG/ML SOAJ INJECT CONTENTS OF 1 PEN SUBCUTANEOUSLY EVERY 14 DAYS. 2 mL 11  ? famotidine (PEPCID) 20 MG tablet Take 20 mg by mouth daily as needed for heartburn.     ? flecainide (TAMBOCOR) 100 MG tablet Take 100 mg by mouth 2 (two) times daily.    ? gabapentin (NEURONTIN) 800 MG tablet TAKE ONE TABLET BY MOUTH TWICE DAILY. (Patient taking differently: 800 mg 2 (two) times daily.) 180 tablet 1  ? hydrochlorothiazide (HYDRODIURIL) 25 MG tablet Take 1 tablet (25 mg total) by mouth daily. 90 tablet 0  ? Multiple Vitamin (MULTIVITAMIN) capsule Take 1 capsule by mouth daily.    ? omeprazole (PRILOSEC) 20 MG capsule Take 20 mg by mouth every other day.     ? sodium chloride (OCEAN) 0.65 % SOLN nasal spray Place 1 spray into both nostrils daily.    ? ?No current facility-administered medications for this visit.   ? ?Allergies  ?Allergen Reactions  ? Tramadol Hcl   ?  Other reaction(s): ineffective  ? Acetaminophen Other (See Comments)  ?  Pt states he was told by [facility] MD's to keep tylenol dosage to 2000 mg per 24 hours

## 2021-11-23 NOTE — Patient Instructions (Addendum)
If you are age 76 or older, your body mass index should be between 23-30. Your Body mass index is 28.77 kg/m?[name] If this is out of the aforementioned range listed, please consider follow up with your Primary Care Provider. ? ?If you are age 26 or younger, your body mass index should be between 19-25. Your Body mass index is 28.77 kg/m?[name] If this is out of the aformentioned range listed, please consider follow up with your Primary Care Provider.  ? ?________________________________________________________ ? ?The [place] [facility] providers would like to encourage you to use [facility] to communicate with providers for non-urgent requests or questions.  Due to long hold times on the telephone, sending your provider a message by [facility] may be a faster and more efficient way to get a response.  Please allow 48 business hours for a response.  Please remember that this is for non-urgent requests.  ?_______________________________________________________ ? ?You have been scheduled for an abdominal ultrasound at [facility] (1st floor of hospital) on Wednesday, 01-12-22 at 9:00 am. Please arrive 30 minutes prior to your appointment for registration. Make certain not to have anything to eat or drink 6 hours prior to your appointment. Should you need to reschedule your appointment, please contact radiology at [phone]. This test typically takes about 30 minutes to perform.  ? ?You will be due for labs in August (CBC, CMET and AFP) ? ?You will be due for a recall colonoscopy in 10-2022. We will send you a reminder in the mail when it gets closer to that time. ? ?Please follow up in 6 months. ? ?Thank you for entrusting me with your care and for choosing [employer], ?Dr. Linden Cellar ? ? ? ? ? ?

## 2021-11-24 ENCOUNTER — Ambulatory Visit: Payer: Medicare Other | Admitting: Family

## 2021-11-24 ENCOUNTER — Ambulatory Visit (INDEPENDENT_AMBULATORY_CARE_PROVIDER_SITE_OTHER): Payer: Medicare Other

## 2021-11-24 ENCOUNTER — Encounter: Payer: Self-pay | Admitting: Family

## 2021-11-24 VITALS — BP 138/87 | HR 58 | Temp 97.7°F | Ht 72.0 in | Wt 211.0 lb

## 2021-11-24 DIAGNOSIS — J0110 Acute frontal sinusitis, unspecified: Secondary | ICD-10-CM

## 2021-11-24 DIAGNOSIS — I4819 Other persistent atrial fibrillation: Secondary | ICD-10-CM | POA: Diagnosis not present

## 2021-11-24 MED ORDER — AMOXICILLIN-POT CLAVULANATE 875-125 MG PO TABS: 1.0000 | ORAL_TABLET | Freq: Two times a day (BID) | ORAL | 0 refills | Status: DC

## 2021-11-24 NOTE — Patient Instructions (Signed)
It was very nice to see you today! ? ?I have sent an antibiotic to your pharmacy, Augmentin. Start this today after eating. ?OK to stop after 5 days if symptoms are mostly resolved. ?Continue to use saline nasal spray 3 times per day and over the counter antihistamine (generic Xyzal or Claritin) daily to help with your sinus drainage.  ? ? ? ? ?PLEASE NOTE: ? ?If you had any lab tests please let us know if you have not heard back within a few days. You may see your results on MyChart before we have a chance to review them but we will give you a call once they are reviewed by [place]. If we ordered any referrals today, please let us know if you have not heard from their office within the next week.  ? ?Please try these tips to maintain a healthy lifestyle: ? ?Eat most of your calories during the day when you are active. Eliminate processed foods including packaged sweets (pies, cakes, cookies), reduce intake of potatoes, white bread, white pasta, and white rice. Look for whole grain options, oat flour or almond flour. ? ?Each meal should contain half fruits/vegetables, one quarter protein, and one quarter carbs (no bigger than a computer mouse). ? ?Cut down on sweet beverages. This includes juice, soda, and sweet tea. Also watch fruit intake, though this is a healthier sweet option, it still contains natural sugar! Limit to 3 servings daily. ? ?Drink at least 1 glass of water with each meal and aim for at least 8 glasses per day ? ?Exercise at least 150 minutes every week.  ? ?

## 2021-11-24 NOTE — Progress Notes (Signed)
? ?Subjective:  ? ? ? Patient ID: Timothy Dickson, male    DOB: 1945-10-12, 76 y.o.   MRN: [id number] ? ?Chief Complaint  ?Patient presents with  ? Cough  ?  Pt c/o cough and nasal congestion, green mucus and has been going on for 3 weeks. Has tried tylenol and robitussin which did help at night.   ? ?HPI: ?Sinusitis: Patient complains of headache described as frontal, nasal congestion, no  fever, non productive cough, post nasal drip, purulent nasal discharge, sinus pressure, and sore throat, with no fever, chills, night sweats or weight loss. Onset of symptoms was 3 weeks ago, unchanged since that time. He is drinking moderate amounts of fluids.  Past history is significant for no history of pneumonia or bronchitis. Patient is non-smoker. ? ? ?Assessment & Plan:  ? ?Problem List Items Addressed This Visit   ?None ?Visit Diagnoses   ? ? Acute non-recurrent frontal sinusitis    -  Primary  ? advised on use & SE of medication. Continue saline nasal spray, OTC antihistamine, Tylenol for aches, fever. ? ?Relevant Medications  ? amoxicillin-clavulanate (AUGMENTIN) 875-125 MG tablet  ? ?  ? ? ?Outpatient Medications Prior to Visit  ?Medication Sig Dispense Refill  ? acetaminophen (TYLENOL) 650 MG CR tablet Take 650 mg by mouth See admin instructions. Take 650 mg at 11 am and take 650 mg at 4-5 am (take with gabapentin)    ? allopurinol (ZYLOPRIM) 100 MG tablet TAKE ONE TABLET BY MOUTH DAILY 90 tablet 3  ? Aloe-Sodium Chloride (AYR SALINE NASAL GEL NA) Place 1 application into the nose at bedtime.    ? carvedilol (COREG) 6.25 MG tablet Take 1 tablet (6.25 mg total) by mouth 2 (two) times daily. 60 tablet 11  ? ELIQUIS 5 MG TABS tablet TAKE 1 TABLET BY MOUTH TWICE DAILY. 60 tablet 10  ? Evolocumab (REPATHA SURECLICK) 423 MG/ML SOAJ INJECT CONTENTS OF 1 PEN SUBCUTANEOUSLY EVERY 14 DAYS. 2 mL 11  ? famotidine (PEPCID) 20 MG tablet Take 20 mg by mouth daily as needed for heartburn.     ? flecainide (TAMBOCOR) 100 MG tablet Take  100 mg by mouth 2 (two) times daily.    ? gabapentin (NEURONTIN) 800 MG tablet TAKE ONE TABLET BY MOUTH TWICE DAILY. (Patient taking differently: 800 mg 2 (two) times daily.) 180 tablet 1  ? hydrochlorothiazide (HYDRODIURIL) 25 MG tablet Take 1 tablet (25 mg total) by mouth daily. 90 tablet 0  ? Multiple Vitamin (MULTIVITAMIN) capsule Take 1 capsule by mouth daily.    ? omeprazole (PRILOSEC) 20 MG capsule Take 20 mg by mouth every other day.     ? sodium chloride (OCEAN) 0.65 % SOLN nasal spray Place 1 spray into both nostrils daily.    ? ?No facility-administered medications prior to visit.  ? ? ?Past Medical History:  ?Diagnosis Date  ? Atrial fibrillation ([place]) 2019  ? Atrial tachycardia ([place])   ? documented by holter 2016. Followed by Dr. Liane Comber and Dr. Rayann Heman.  ? Cancer of kidney ([place])   ? right kidney-removed. no further tx other than surgery [facility].  ? Cirrhosis ([place])   ? [facility] dx, early stage- follow up visits at [facility].  ? CRI (chronic renal insufficiency)   ? s/p R nephrectomy in 1999 for malignancy  ? Diabetes mellitus without complication ([place])   ? NIDDM- dx. 3-4 years ago diet control  ? Diverticulosis of colon   ? mild  ? GERD (gastroesophageal reflux disease)   ?  Headache(784.0)   ? Hepatitis C   ? s/p therapy [facility] (curative)/with Harvoni  ? History of BPH   ? no recent issues  ? History of substance abuse ([place])   ? past history -none in 38 yrs  ? Hyperglycemia   ? Hyperlipemia   ? Hypertension   ? Osteoarthritis, knee   ? knees bilaterally   ? Pneumonia 1975  ? hx of 35 years ago  ? ? ?Past Surgical History:  ?Procedure Laterality Date  ? Arthroscopy right knee    ? may '11 (Dr Alvan Dame)  ? Arthroscopy, left knee,    ? ATRIAL FIBRILLATION ABLATION N/A 10/11/2018  ? Procedure: ATRIAL FIBRILLATION ABLATION;  Surgeon: Thompson Grayer, MD;  Location: [place] [facility];  Service: Cardiovascular;  Laterality: N/A;  ? ATRIAL FIBRILLATION ABLATION N/A 06/19/2020  ? Procedure: ATRIAL FIBRILLATION ABLATION;  Surgeon:  Thompson Grayer, MD;  Location: [place] CV LAB;  Service: Cardiovascular;  Laterality: N/A;  ? CARDIAC ELECTROPHYSIOLOGY MAPPING AND ABLATION    ? history of liver biopsy  2007  ? History of Nephrectomy Right 1999  ? implantable loop recorder placement  03/19/2020  ?  Medtronic Reveal Linq model LNQ 22 (RLB [id number] ) implantable loop recorder implanted by Dr Rayann Heman for evaluation of palpitations and afib management post ablation  ? KNEE ARTHROSCOPY Left 10/10/2012  ? Procedure: LEFT KNEE ARTHROSCOPY WITH MENSCIAL DEBRIDEMENT AND CHONDROPLASTY;  Surgeon: Gearlean Alf, MD;  Location: [facility];  Service: Orthopedics;  Laterality: Left;  ? left shoulder repair for bone spur  1990's  ? TEE WITHOUT CARDIOVERSION N/A 10/11/2018  ? Procedure: TRANSESOPHAGEAL ECHOCARDIOGRAM (TEE);  Surgeon: Buford Dresser, MD;  Location: [facility] ENDOSCOPY;  Service: Cardiovascular;  Laterality: N/A;  ablation to follow at 1030  ? TOTAL KNEE ARTHROPLASTY Right 08/15/2016  ? Procedure: RIGHT TOTAL KNEE ARTHROPLASTY, CORTISONE INJECTION OF LEFT KNEE;  Surgeon: Gaynelle Arabian, MD;  Location: [facility];  Service: Orthopedics;  Laterality: Right;  ? TOTAL KNEE ARTHROPLASTY Left 09/04/2017  ? Procedure: LEFT TOTAL KNEE ARTHROPLASTY;  Surgeon: Gaynelle Arabian, MD;  Location: [facility];  Service: Orthopedics;  Laterality: Left;  ? Transurethral needle ablation procedure    ? TUNA procedure    ? ? ?Allergies  ?Allergen Reactions  ? Tramadol Hcl   ?  Other reaction(s): ineffective  ? Acetaminophen Other (See Comments)  ?  Pt states he was told by [facility] MD's to keep tylenol dosage to 2000 mg per 24 hours due to cirrhosis of liver.  ? Nsaids Other (See Comments)  ?  Avoid due to having 1 kidney   ? Pravastatin Other (See Comments)  ?  Pt reports this med makes him generally feel unwell.  ? ? ?   ?Objective:  ?  ?Physical Exam ?Vitals and nursing note reviewed.  ?Constitutional:   ?   General: He is not in acute distress. ?   Appearance: Normal appearance.   ?HENT:  ?   Head: Normocephalic.  ?   Right Ear: There is impacted cerumen.  ?   Left Ear: There is impacted cerumen.  ?   Nose:  ?   Right Sinus: Frontal sinus tenderness present.  ?   Left Sinus: Frontal sinus tenderness present.  ?   Mouth/Throat:  ?   Mouth: Mucous membranes are moist.  ?   Pharynx: Oropharyngeal exudate, posterior oropharyngeal erythema and uvula swelling present. No pharyngeal swelling.  ?Cardiovascular:  ?   Rate and Rhythm: Normal rate and regular rhythm.  ?  Pulmonary:  ?   Effort: Pulmonary effort is normal.  ?   Breath sounds: Normal breath sounds.  ?Musculoskeletal:     ?   General: Normal range of motion.  ?   Cervical back: Normal range of motion.  ?Skin: ?   General: Skin is warm and dry.  ?Neurological:  ?   Mental Status: He is alert and oriented to person, place, and time.  ?Psychiatric:     ?   Mood and Affect: Mood normal.  ? ? ?BP 138/87 (BP Location: Left Arm, Patient Position: Sitting, Cuff Size: Large)   Pulse (!) 58   Temp 97.7 ?F (36.5 ?C) (Temporal)   Ht 6' (1.829 m)   Wt 211 lb (95.7 kg)   SpO2 98%   BMI 28.62 kg/m?  ?Wt Readings from Last 3 Encounters:  ?11/24/21 211 lb (95.7 kg)  ?11/23/21 212 lb 2 oz (96.2 kg)  ?10/22/21 212 lb 12.8 oz (96.5 kg)  ? ? ?   ?Jeanie Sewer, NP ? ?

## 2021-11-25 LAB — CUP PACEART REMOTE DEVICE CHECK
Date Time Interrogation Session: 20230426195740
Implantable Pulse Generator Implant Date: 20210819

## 2021-11-30 ENCOUNTER — Encounter: Payer: Self-pay | Admitting: Vascular Surgery

## 2021-11-30 ENCOUNTER — Ambulatory Visit: Payer: Medicare Other | Admitting: Vascular Surgery

## 2021-11-30 VITALS — BP 116/75 | HR 67 | Temp 98.7°F | Resp 20 | Ht 72.0 in | Wt 212.0 lb

## 2021-11-30 DIAGNOSIS — I6503 Occlusion and stenosis of bilateral vertebral arteries: Secondary | ICD-10-CM | POA: Diagnosis not present

## 2021-11-30 NOTE — Progress Notes (Signed)
[facility] [place] ? ?ASSESSMENT / PLAN: ?76 y.o. 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  I think these are asymptomatic.  He describes no symptoms consistent with vertebrobasilar insufficiency or posterior stroke.  The event that prompted his work-up was found out to be actually electrical in nature, and not related to any kind of neurologic disturbance.  Recommend best medical therapy for atherosclerosis which he is already taking.  I would only offer intervention for these lesions if he developed vertebrobasilar insufficiency or posterior stroke attributable to these lesions. ? ?CHIEF COMPLAINT: Follow-up CT scan ? ?HISTORY OF PRESENT ILLNESS: ?Timothy Dickson is a 76 y.o. male referred to clinic by Dr. Dimas Chyle for evaluation of bilateral vertebral atherosclerosis.  The patient was resting at home several weeks ago when he noticed flickering lights in his bedroom.  He was concerned he had a visual disturbance.  This initiated a work-up by his primary care physician.  A CT angiogram of his head neck was performed.  This showed bilateral vertebral artery atherosclerotic disease causing moderate stenosis.  Later, the patient found out that the lights were indeed flickering, and electrical issue, making it less likely that he was having active neurologic problems.  He describes no symptoms consistent with vertebrobasilar insufficiency.  He describes no dizziness when using his upper extremities.  He has never had a "drop attack." ? ?Past Medical History:  ?Diagnosis Date  ? Atrial fibrillation ([place]) 2019  ? Atrial tachycardia ([place])   ? documented by holter 2016. Followed by Dr. Liane Comber and Dr. Rayann Heman.  ? 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 right kidney-removed. no further tx other than surgery [facility].  ? Cirrhosis ([place])   ? [facility] dx, early stage- follow up visits at [facility].  ? CRI (chronic renal insufficiency)   ? s/p R nephrectomy in 1999 for malignancy  ?  Diabetes mellitus without complication ([place])   ? NIDDM- dx. 3-4 years ago diet control  ? Diverticulosis of colon   ? mild  ? GERD (gastroesophageal reflux disease)   ? Headache(784.0)   ? Hepatitis C   ? s/p therapy [facility] (curative)/with Harvoni  ? History of BPH   ? no recent issues  ? History of substance abuse ([place])   ? past history -none in 38 yrs  ? Hyperglycemia   ? Hyperlipemia   ? Hypertension   ? Osteoarthritis, knee   ? knees bilaterally   ? Pneumonia 1975  ? hx of 35 years ago  ? ? ?Past Surgical History:  ?Procedure Laterality Date  ? Arthroscopy right knee    ? may '11 (Dr Alvan Dame)  ? Arthroscopy, left knee,    ? ATRIAL FIBRILLATION ABLATION N/A 10/11/2018  ? Procedure: ATRIAL FIBRILLATION ABLATION;  Surgeon: Thompson Grayer, MD;  Location: [place] CV LAB;  Service: Cardiovascular;  Laterality: N/A;  ? ATRIAL FIBRILLATION ABLATION N/A 06/19/2020  ? Procedure: ATRIAL FIBRILLATION ABLATION;  Surgeon: Thompson Grayer, MD;  Location: [place] CV LAB;  Service: Cardiovascular;  Laterality: N/A;  ? CARDIAC ELECTROPHYSIOLOGY MAPPING AND ABLATION    ? history of liver biopsy  2007  ? History of Nephrectomy Right 1999  ? implantable loop recorder placement  03/19/2020  ?  Medtronic Reveal Linq model LNQ 22 (RLB [id number] ) implantable loop recorder implanted by Dr Rayann Heman for evaluation of palpitations and afib management post ablation  ? KNEE ARTHROSCOPY Left 10/10/2012  ? Procedure: LEFT KNEE ARTHROSCOPY WITH MENSCIAL DEBRIDEMENT AND CHONDROPLASTY;  Surgeon: Gearlean Alf, MD;  Location[facility];  Service: Orthopedics;  Laterality: Left;  ? left shoulder repair for bone spur  1990's  ? TEE WITHOUT CARDIOVERSION N/A 10/11/2018  ? Procedure: TRANSESOPHAGEAL ECHOCARDIOGRAM (TEE);  Surgeon: Buford Dresser, MD;  Location: [facility] ENDOSCOPY;  Service: Cardiovascular;  Laterality: N/A;  ablation to follow at 1030  ? TOTAL KNEE ARTHROPLASTY Right 08/15/2016  ? Procedure: RIGHT TOTAL KNEE ARTHROPLASTY, CORTISONE INJECTION  OF LEFT KNEE;  Surgeon: Gaynelle Arabian, MD;  [facility];  Service: Orthopedics;  Laterality: Right;  ? TOTAL KNEE ARTHROPLASTY Left 09/04/2017  ? Procedure: LEFT TOTAL KNEE ARTHROPLASTY;  Surgeon: Gaynelle Arabian, MD;  [facility];  Service: Orthopedics;  Laterality: Left;  ? Transurethral needle ablation procedure    ? TUNA procedure    ? ? ?Family History  ?Problem Relation Age of Onset  ? Coronary artery disease Mother   ? CVA Mother   ? Alcohol abuse Father   ?     variceal hemorrhage  ? Liver disease Father   ?     alcohol related  ? Alcohol abuse Sister   ? Diabetes Sister   ? Breast cancer Sister   ? Lung cancer Sister   ? Esophageal cancer Sister   ? Cancer Brother   ?     head and neck  ? Alcohol abuse Other   ?     Strong family Hx  ? Colon cancer Neg Hx   ? Pancreatic cancer Neg Hx   ? ? ?Social History  ? ?Socioeconomic History  ? Marital status: Married  ?  Spouse name: Not on file  ? Number of children: Not on file  ? Years of education: Not on file  ? Highest education level: Not on file  ?Occupational History  ? Not on file  ?Tobacco Use  ? Smoking status: Former  ?  Packs/day: 1.00  ?  Years: 15.00  ?  Pack years: 15.00  ?  Types: Cigarettes  ?  Quit date: 08/01/1977  ?  Years since quitting: 44.3  ? Smokeless tobacco: Never  ?Vaping Use  ? Vaping Use: Never used  ?Substance and Sexual Activity  ? Alcohol use: No  ?  Comment: past history -none in 38 yrs  ? Drug use: No  ?  Comment: past hx none in 38 yrs  ? Sexual activity: Yes  ?  Partners: Female  ?Other Topics Concern  ? Not on file  ?Social History Narrative  ? [place] BA, [place], [place] psych. Married- '84. 2 step-daughters, 4 grandchildren. Work  Retired Radio producer  Marriage in good health.  ? Two story home  ? Drinks caffeine  ? Ambidextrous   ? ?Social Determinants of Health  ? ?Financial Resource Strain: Not on file  ?Food Insecurity: Not on file  ?Transportation Needs: Not on file  ?Physical Activity: Not on  file  ?Stress: Not on file  ?Social Connections: Not on file  ?Intimate Partner Violence: Not on file  ? ? ?Allergies  ?Allergen Reactions  ? Tramadol Hcl   ?  Other reaction(s): ineffective  ? Acetaminophen Other (See Comments)  ?  Pt states he was told by [facility] MD's to keep tylenol dosage to 2000 mg per 24 hours due to cirrhosis of liver.  ? Nsaids Other (See Comments)  ?  Avoid due to having 1 kidney   ? Pravastatin Other (See Comments)  ?  Pt reports this med makes him  generally feel unwell.  ? ? ?Current Outpatient Medications  ?Medication Sig Dispense Refill  ? acetaminophen (TYLENOL) 650 MG CR tablet Take 650 mg by mouth See admin instructions. Take 650 mg at 11 am and take 650 mg at 4-5 am (take with gabapentin)    ? allopurinol (ZYLOPRIM) 100 MG tablet TAKE ONE TABLET BY MOUTH DAILY 90 tablet 3  ? Aloe-Sodium Chloride (AYR SALINE NASAL GEL NA) Place 1 application into the nose at bedtime.    ? amoxicillin-clavulanate (AUGMENTIN) 875-125 MG tablet Take 1 tablet by mouth 2 (two) times daily after a meal. 14 tablet 0  ? carvedilol (COREG) 6.25 MG tablet Take 1 tablet (6.25 mg total) by mouth 2 (two) times daily. 60 tablet 11  ? ELIQUIS 5 MG TABS tablet TAKE 1 TABLET BY MOUTH TWICE DAILY. 60 tablet 10  ? Evolocumab (REPATHA SURECLICK) 443 MG/ML SOAJ INJECT CONTENTS OF 1 PEN SUBCUTANEOUSLY EVERY 14 DAYS. 2 mL 11  ? famotidine (PEPCID) 20 MG tablet Take 20 mg by mouth daily as needed for heartburn.     ? flecainide (TAMBOCOR) 100 MG tablet Take 100 mg by mouth 2 (two) times daily.    ? gabapentin (NEURONTIN) 800 MG tablet TAKE ONE TABLET BY MOUTH TWICE DAILY. (Patient taking differently: 800 mg 2 (two) times daily.) 180 tablet 1  ? hydrochlorothiazide (HYDRODIURIL) 25 MG tablet Take 1 tablet (25 mg total) by mouth daily. 90 tablet 0  ? Multiple Vitamin (MULTIVITAMIN) capsule Take 1 capsule by mouth daily.    ? omeprazole (PRILOSEC) 20 MG capsule Take 20 mg by mouth every other day.     ? sodium chloride (OCEAN)  0.65 % SOLN nasal spray Place 1 spray into both nostrils daily.    ? ?No current facility-administered medications for this visit.  ? ? ?PHYSICAL EXAM ?Vitals:  ? 11/30/21 1057  ?BP: 116/75  ?Pulse: 67  ?

## 2021-12-09 NOTE — Progress Notes (Signed)
Carelink Summary Report / Loop Recorder 

## 2021-12-20 ENCOUNTER — Other Ambulatory Visit: Payer: Self-pay | Admitting: *Deleted

## 2021-12-20 MED ORDER — FLECAINIDE ACETATE 100 MG PO TABS: 100.0000 mg | ORAL_TABLET | Freq: Two times a day (BID) | ORAL | 1 refills | Status: DC

## 2021-12-28 ENCOUNTER — Ambulatory Visit (INDEPENDENT_AMBULATORY_CARE_PROVIDER_SITE_OTHER): Payer: Medicare Other

## 2021-12-28 DIAGNOSIS — I4819 Other persistent atrial fibrillation: Secondary | ICD-10-CM

## 2021-12-28 LAB — CUP PACEART REMOTE DEVICE CHECK
Date Time Interrogation Session: 20230529195823
Implantable Pulse Generator Implant Date: 20210819

## 2022-01-02 ENCOUNTER — Other Ambulatory Visit: Payer: Self-pay | Admitting: Family Medicine

## 2022-01-06 ENCOUNTER — Encounter: Payer: Self-pay | Admitting: Cardiology

## 2022-01-07 ENCOUNTER — Other Ambulatory Visit: Payer: Self-pay | Admitting: Pharmacist

## 2022-01-07 DIAGNOSIS — E1169 Type 2 diabetes mellitus with other specified complication: Secondary | ICD-10-CM

## 2022-01-07 MED ORDER — REPATHA SURECLICK 140 MG/ML ~~LOC~~ SOAJ: SUBCUTANEOUS | 3 refills | Status: DC

## 2022-01-11 ENCOUNTER — Encounter: Payer: Self-pay | Admitting: Family Medicine

## 2022-01-11 DIAGNOSIS — L821 Other seborrheic keratosis: Secondary | ICD-10-CM | POA: Diagnosis not present

## 2022-01-11 DIAGNOSIS — L570 Actinic keratosis: Secondary | ICD-10-CM | POA: Diagnosis not present

## 2022-01-11 DIAGNOSIS — Z85828 Personal history of other malignant neoplasm of skin: Secondary | ICD-10-CM | POA: Diagnosis not present

## 2022-01-12 ENCOUNTER — Other Ambulatory Visit: Payer: Self-pay | Admitting: Pharmacist

## 2022-01-12 ENCOUNTER — Ambulatory Visit (HOSPITAL_COMMUNITY)
Admission: RE | Admit: 2022-01-12 | Discharge: 2022-01-12 | Disposition: A | Discharge status: Home / Self Care | Payer: Medicare Other | Source: Ambulatory Visit | Attending: Gastroenterology | Admitting: Gastroenterology

## 2022-01-12 DIAGNOSIS — Z1211 Encounter for screening for malignant neoplasm of colon: Secondary | ICD-10-CM | POA: Diagnosis not present

## 2022-01-12 DIAGNOSIS — Z8619 Personal history of other infectious and parasitic diseases: Secondary | ICD-10-CM | POA: Diagnosis not present

## 2022-01-12 DIAGNOSIS — Z7901 Long term (current) use of anticoagulants: Secondary | ICD-10-CM | POA: Diagnosis not present

## 2022-01-12 DIAGNOSIS — E1169 Type 2 diabetes mellitus with other specified complication: Secondary | ICD-10-CM

## 2022-01-12 DIAGNOSIS — K7460 Unspecified cirrhosis of liver: Secondary | ICD-10-CM | POA: Diagnosis not present

## 2022-01-12 MED ORDER — REPATHA SURECLICK 140 MG/ML ~~LOC~~ SOAJ: SUBCUTANEOUS | 3 refills | Status: DC

## 2022-01-13 NOTE — Progress Notes (Signed)
Done

## 2022-01-13 NOTE — Progress Notes (Signed)
Carelink Summary Report / Loop Recorder 

## 2022-01-27 ENCOUNTER — Telehealth: Payer: Self-pay | Admitting: Family Medicine

## 2022-01-27 NOTE — Telephone Encounter (Signed)
Copied from [place] [phone]. Topic: Medicare AWV >> Jan 27, 2022 10:53 AM Devoria Glassing wrote: Reason for CRM: Called patient to schedule Annual Wellness Visit.  Please schedule with Nurse Health Advisor Charlott Rakes, RN at [facility].  Please call [phone] ask for [facility]

## 2022-01-29 LAB — CUP PACEART REMOTE DEVICE CHECK
Date Time Interrogation Session: 20230701195521
Implantable Pulse Generator Implant Date: 20210819

## 2022-01-30 ENCOUNTER — Encounter: Payer: Self-pay | Admitting: Gastroenterology

## 2022-01-31 ENCOUNTER — Ambulatory Visit (INDEPENDENT_AMBULATORY_CARE_PROVIDER_SITE_OTHER): Payer: Medicare Other

## 2022-01-31 DIAGNOSIS — I4819 Other persistent atrial fibrillation: Secondary | ICD-10-CM | POA: Diagnosis not present

## 2022-02-04 ENCOUNTER — Ambulatory Visit (INDEPENDENT_AMBULATORY_CARE_PROVIDER_SITE_OTHER): Payer: Medicare Other | Admitting: Internal Medicine

## 2022-02-04 ENCOUNTER — Encounter: Payer: Self-pay | Admitting: Internal Medicine

## 2022-02-04 VITALS — BP 100/64 | HR 60 | Ht 72.0 in | Wt 211.8 lb

## 2022-02-04 DIAGNOSIS — I4819 Other persistent atrial fibrillation: Secondary | ICD-10-CM | POA: Diagnosis not present

## 2022-02-04 MED ORDER — FLECAINIDE ACETATE 100 MG PO TABS: 100.0000 mg | ORAL_TABLET | Freq: Two times a day (BID) | ORAL | 3 refills | Status: DC

## 2022-02-04 MED ORDER — APIXABAN 5 MG PO TABS: 5.0000 mg | ORAL_TABLET | Freq: Two times a day (BID) | ORAL | 11 refills | Status: DC

## 2022-02-04 MED ORDER — CARVEDILOL 6.25 MG PO TABS: 6.2500 mg | ORAL_TABLET | Freq: Two times a day (BID) | ORAL | 3 refills | Status: DC

## 2022-02-04 MED ORDER — HYDROCHLOROTHIAZIDE 25 MG PO TABS: 25.0000 mg | ORAL_TABLET | Freq: Every day | ORAL | 3 refills | Status: DC

## 2022-02-04 NOTE — Patient Instructions (Signed)

## 2022-02-06 NOTE — Progress Notes (Signed)
HPI Timothy Dickson returns today for followup. He is a pleasant 76 yo man with persistent atrial fib. He has a h/o HTN, DM, s/p ablation. He had recurrent atrial fib requiring that he go back on flecainide but since then he has been stable. He was seen a couple of months ago with visual changes and workup has been unremarkable. Review of his ILR demonstrates 99.9% NSR. He has not had syncope and denies chest pain or sob. He does feel his atrial fib.  Allergies  Allergen Reactions   Tramadol Hcl     Other reaction(s): ineffective   Acetaminophen Other (See Comments)    Pt states he was told by [facility] to keep tylenol dosage to 2000 mg per 24 hours due to cirrhosis of liver.   Nsaids Other (See Comments)    Avoid due to having 1 kidney    Pravastatin Other (See Comments)    Pt reports this med makes him generally feel unwell.     Current Outpatient Medications  Medication Sig Dispense Refill   acetaminophen (TYLENOL) 650 MG CR tablet Take 650 mg by mouth See admin instructions. Take 650 mg at 11 am and take 650 mg at 4-5 am (take with gabapentin)     allopurinol (ZYLOPRIM) 100 MG tablet TAKE ONE TABLET BY MOUTH DAILY 90 tablet 3   Aloe-Sodium Chloride (AYR SALINE NASAL GEL NA) Place 1 application into the nose at bedtime.     amoxicillin-clavulanate (AUGMENTIN) 875-125 MG tablet Take 1 tablet by mouth 2 (two) times daily after a meal. 14 tablet 0   Evolocumab (REPATHA SURECLICK) 749 MG/ML SOAJ INJECT CONTENTS OF 1 PEN SUBCUTANEOUSLY EVERY 14 DAYS. 6 mL 3   famotidine (PEPCID) 20 MG tablet Take 20 mg by mouth daily as needed for heartburn.      gabapentin (NEURONTIN) 800 MG tablet TAKE ONE TABLET BY MOUTH TWICE DAILY 180 tablet 1   Multiple Vitamin (MULTIVITAMIN) capsule Take 1 capsule by mouth daily.     omeprazole (PRILOSEC) 20 MG capsule Take 20 mg by mouth every other day.      sodium chloride (OCEAN) 0.65 % SOLN nasal spray Place 1 spray into both nostrils daily.     apixaban  (ELIQUIS) 5 MG TABS tablet Take 1 tablet (5 mg total) by mouth 2 (two) times daily. 60 tablet 11   carvedilol (COREG) 6.25 MG tablet Take 1 tablet (6.25 mg total) by mouth 2 (two) times daily. 180 tablet 3   flecainide (TAMBOCOR) 100 MG tablet Take 1 tablet (100 mg total) by mouth 2 (two) times daily. 180 tablet 3   hydrochlorothiazide (HYDRODIURIL) 25 MG tablet Take 1 tablet (25 mg total) by mouth daily. 90 tablet 3   No current facility-administered medications for this visit.     Past Medical History:  Diagnosis Date   Atrial fibrillation ([place]) 2019   Atrial tachycardia ([place])    documented by holter 2016. Followed by Dr. Liane Comber and Dr. Rayann Heman.   Cancer of kidney ([place])    right kidney-removed. no further tx other than surgery [facility].   Cirrhosis ([place])    [facility] dx, early stage- follow up visits at [facility].   CRI (chronic renal insufficiency)    s/p R nephrectomy in 1999 for malignancy   Diabetes mellitus without complication (HCC)    NIDDM- dx. 3-4 years ago diet control   Diverticulosis of colon    mild   GERD (gastroesophageal reflux disease)    Headache(784.0)  Hepatitis C    s/p therapy [facility] (curative)/with Harvoni   History of BPH    no recent issues   History of substance abuse ([place])    past history -none in 38 yrs   Hyperglycemia    Hyperlipemia    Hypertension    Osteoarthritis, knee    knees bilaterally    Pneumonia 1975   hx of 35 years ago    ROS:   All systems reviewed and negative except as noted in the HPI.   Past Surgical History:  Procedure Laterality Date   Arthroscopy right knee     may '11 (Dr Alvan Dame)   Arthroscopy, left knee,     ATRIAL FIBRILLATION ABLATION N/A 10/11/2018   Procedure: ATRIAL FIBRILLATION ABLATION;  Surgeon: Thompson Grayer, MD;  Location: [place] [facility];  Service: Cardiovascular;  Laterality: N/A;   ATRIAL FIBRILLATION ABLATION N/A 06/19/2020   Procedure: ATRIAL FIBRILLATION ABLATION;  Surgeon: Thompson Grayer, MD;  Location: [place] [facility];  Service: Cardiovascular;  Laterality: N/A;   CARDIAC ELECTROPHYSIOLOGY MAPPING AND ABLATION     history of liver biopsy  2007   History of Nephrectomy Right 1999   implantable loop recorder placement  03/19/2020    Medtronic Reveal Linq model LNQ 22 (RLB [id number] ) implantable loop recorder implanted by Dr Rayann Heman for evaluation of palpitations and afib management post ablation   KNEE ARTHROSCOPY Left 10/10/2012   Procedure: LEFT KNEE ARTHROSCOPY WITH MENSCIAL DEBRIDEMENT AND CHONDROPLASTY;  Surgeon: Gearlean Alf, MD;  [facility];  Service: Orthopedics;  Laterality: Left;   left shoulder repair for bone spur  1990's   TEE WITHOUT CARDIOVERSION N/A 10/11/2018   Procedure: TRANSESOPHAGEAL ECHOCARDIOGRAM (TEE);  Surgeon: Buford Dresser, MD;  Location: [facility] ENDOSCOPY;  Service: Cardiovascular;  Laterality: N/A;  ablation to follow at [place] Right 08/15/2016   Procedure: RIGHT TOTAL KNEE ARTHROPLASTY, CORTISONE INJECTION OF LEFT KNEE;  Surgeon: Gaynelle Arabian, MD;  [facility];  Service: Orthopedics;  Laterality: Right;   TOTAL KNEE ARTHROPLASTY Left 09/04/2017   Procedure: LEFT TOTAL KNEE ARTHROPLASTY;  Surgeon: Gaynelle Arabian, MD;  [facility];  Service: Orthopedics;  Laterality: Left;   Transurethral needle ablation procedure     TUNA procedure       Family History  Problem Relation Age of Onset   Coronary artery disease Mother    CVA Mother    Alcohol abuse Father        variceal hemorrhage   Liver disease Father        alcohol related   Alcohol abuse Sister    Diabetes Sister    Breast cancer Sister    Lung cancer Sister    Esophageal cancer Sister    Cancer Brother        head and neck   Alcohol abuse Other        Strong family Hx   Colon cancer Neg Hx    Pancreatic cancer Neg Hx      Social History   Socioeconomic History   Marital status: Married    Spouse name: Not on file   Number of children: Not on file    Years of education: Not on file   Highest education level: Not on file  Occupational History   Not on file  Tobacco Use   Smoking status: Former    Packs/day: 1.00    Years: 15.00    Total pack years: 15.00    Types:  Cigarettes    Quit date: 08/01/1977    Years since quitting: 44.5   Smokeless tobacco: Never  Vaping Use   Vaping Use: Never used  Substance and Sexual Activity   Alcohol use: No    Comment: past history -none in 38 yrs   Drug use: No    Comment: past hx none in 38 yrs   Sexual activity: Yes    Partners: Female  Other Topics Concern   Not on file  Social History Narrative   [place] [employer], [place], [place] psych. Married- '84. 2 step-daughters, 4 grandchildren. Work  Retired Radio producer  Marriage in good health.   Two story home   Drinks caffeine   Ambidextrous    Social Determinants of Health   Financial Resource Strain: Not on file  Food Insecurity: Not on file  Transportation Needs: Not on file  Physical Activity: Not on file  Stress: Not on file  Social Connections: Not on file  Intimate Partner Violence: Not on file     BP 100/64   Pulse 60   Ht 6' (1.829 m)   Wt 211 lb 12.8 oz (96.1 kg)   SpO2 96%   BMI 28.73 kg/m   Physical Exam:  Well appearing NAD HEENT: Unremarkable Neck:  No JVD, no thyromegally Lymphatics:  No adenopathy Back:  No CVA tenderness Lungs:  Clear with no wheezes HEART:  Regular rate rhythm, no murmurs, no rubs, no clicks Abd:  soft, positive bowel sounds, no organomegally, no rebound, no guarding Ext:  2 plus pulses, no edema, no cyanosis, no clubbing Skin:  No rashes no nodules Neuro:  CN II through XII intact, motor grossly intact  EKG- NSR at 60/min  DEVICE  Normal device function.  See PaceArt for details.  ILR as above.  Assess/Plan:  PAF - he is s/p ablation with recurrent atrial fib who has maintained NSR nicely back on flecainide. His ECG is reassuring. HTN - his bp is well  controlled. Overweight - I encouraged him to try and get under 200 lbs. Coags - he has not had any bleeding on Eliquis. Continue  Carleene Overlie Morene Cecilio,MD

## 2022-02-24 NOTE — Progress Notes (Signed)
Carelink Summary Report / Loop Recorder 

## 2022-02-28 ENCOUNTER — Telehealth: Payer: Self-pay | Admitting: Family Medicine

## 2022-02-28 NOTE — Telephone Encounter (Signed)
Copied from [place] [id number]. Topic: Medicare AWV >> Feb 28, 2022 10:31 AM Devoria Glassing wrote: Reason for CRM: Left message for patient to schedule Annual Wellness Visit.  Please schedule with Nurse Health Advisor Charlott Rakes, RN at [facility]. This appt can be telephone or office visit. Please call [phone] ask for [facility]

## 2022-03-02 ENCOUNTER — Telehealth: Payer: Self-pay

## 2022-03-02 DIAGNOSIS — Z8619 Personal history of other infectious and parasitic diseases: Secondary | ICD-10-CM

## 2022-03-02 DIAGNOSIS — K7460 Unspecified cirrhosis of liver: Secondary | ICD-10-CM

## 2022-03-02 NOTE — Telephone Encounter (Signed)
-----   Message from Roetta Sessions, [place] sent at 11/23/2021  2:51 PM EDT ----- Regarding: due for labs Call patient and MyChart to have go to the lab for CBC, CMET and AFP in August Dx: Cirrhosis, Hx of Hep C

## 2022-03-02 NOTE — Telephone Encounter (Signed)
Patient due for labs in August. Orders entered. MyChart message sent to patient and Left message on phone

## 2022-03-03 ENCOUNTER — Other Ambulatory Visit (INDEPENDENT_AMBULATORY_CARE_PROVIDER_SITE_OTHER): Payer: Medicare Other

## 2022-03-03 DIAGNOSIS — K7460 Unspecified cirrhosis of liver: Secondary | ICD-10-CM | POA: Diagnosis not present

## 2022-03-03 DIAGNOSIS — Z8619 Personal history of other infectious and parasitic diseases: Secondary | ICD-10-CM

## 2022-03-03 LAB — COMPREHENSIVE METABOLIC PANEL
ALT: 15 U/L (ref 0–53)
AST: 22 U/L (ref 0–37)
Albumin: 4.4 g/dL (ref 3.5–5.2)
Alkaline Phosphatase: 58 U/L (ref 39–117)
BUN: 22 mg/dL (ref 6–23)
CO2: 28 mEq/L (ref 19–32)
Calcium: 9.5 mg/dL (ref 8.4–10.5)
Chloride: 97 mEq/L (ref 96–112)
Creatinine, Ser: 1.25 mg/dL (ref 0.40–1.50)
GFR: 55.98 mL/min — ABNORMAL LOW (ref >60.00)
Glucose, Bld: 180 mg/dL — ABNORMAL HIGH (ref 70–99)
Potassium: 4 mEq/L (ref 3.5–5.1)
Sodium: 134 mEq/L — ABNORMAL LOW (ref 135–145)
Total Bilirubin: 0.8 mg/dL (ref 0.2–1.2)
Total Protein: 7 g/dL (ref 6.0–8.3)

## 2022-03-03 LAB — CBC WITH DIFFERENTIAL/PLATELET
Basophils Absolute: 0.1 10*3/uL (ref 0.0–0.1)
Basophils Relative: 1 % (ref 0.0–3.0)
Eosinophils Absolute: 0.2 10*3/uL (ref 0.0–0.7)
Eosinophils Relative: 2.6 % (ref 0.0–5.0)
HCT: 43.5 % (ref 39.0–52.0)
Hemoglobin: 14.8 g/dL (ref 13.0–17.0)
Lymphocytes Relative: 17.4 % (ref 12.0–46.0)
Lymphs Abs: 1.2 10*3/uL (ref 0.7–4.0)
MCHC: 34.1 g/dL (ref 30.0–36.0)
MCV: 92.5 fl (ref 78.0–100.0)
Monocytes Absolute: 0.4 10*3/uL (ref 0.1–1.0)
Monocytes Relative: 6.5 % (ref 3.0–12.0)
Neutro Abs: 5 10*3/uL (ref 1.4–7.7)
Neutrophils Relative %: 72.5 % (ref 43.0–77.0)
Platelets: 191 10*3/uL (ref 150.0–400.0)
RBC: 4.71 Mil/uL (ref 4.22–5.81)
RDW: 13.1 % (ref 11.5–15.5)
WBC: 6.9 10*3/uL (ref 4.0–10.5)

## 2022-03-04 LAB — CUP PACEART REMOTE DEVICE CHECK
Date Time Interrogation Session: 20230803195707
Implantable Pulse Generator Implant Date: 20210819

## 2022-03-06 LAB — AFP TUMOR MARKER: AFP-Tumor Marker: 2.4 ng/mL (ref <6.1)

## 2022-03-07 ENCOUNTER — Ambulatory Visit (INDEPENDENT_AMBULATORY_CARE_PROVIDER_SITE_OTHER): Payer: Medicare Other

## 2022-03-07 DIAGNOSIS — I4819 Other persistent atrial fibrillation: Secondary | ICD-10-CM

## 2022-03-16 ENCOUNTER — Encounter: Payer: Self-pay | Admitting: Family Medicine

## 2022-03-29 ENCOUNTER — Ambulatory Visit (INDEPENDENT_AMBULATORY_CARE_PROVIDER_SITE_OTHER): Payer: Medicare Other | Admitting: Family Medicine

## 2022-03-29 ENCOUNTER — Encounter: Payer: Self-pay | Admitting: Family Medicine

## 2022-03-29 VITALS — BP 133/83 | HR 64 | Temp 97.9°F | Ht 72.0 in | Wt 211.8 lb

## 2022-03-29 DIAGNOSIS — E1169 Type 2 diabetes mellitus with other specified complication: Secondary | ICD-10-CM

## 2022-03-29 DIAGNOSIS — Z0001 Encounter for general adult medical examination with abnormal findings: Secondary | ICD-10-CM

## 2022-03-29 DIAGNOSIS — E785 Hyperlipidemia, unspecified: Secondary | ICD-10-CM

## 2022-03-29 DIAGNOSIS — I152 Hypertension secondary to endocrine disorders: Secondary | ICD-10-CM

## 2022-03-29 DIAGNOSIS — E119 Type 2 diabetes mellitus without complications: Secondary | ICD-10-CM

## 2022-03-29 DIAGNOSIS — E1159 Type 2 diabetes mellitus with other circulatory complications: Secondary | ICD-10-CM | POA: Diagnosis not present

## 2022-03-29 LAB — TSH: TSH: 1.03 u[IU]/mL (ref 0.35–5.50)

## 2022-03-29 LAB — CBC
HCT: 44.3 % (ref 39.0–52.0)
Hemoglobin: 15.1 g/dL (ref 13.0–17.0)
MCHC: 34 g/dL (ref 30.0–36.0)
MCV: 92.4 fl (ref 78.0–100.0)
Platelets: 212 10*3/uL (ref 150.0–400.0)
RBC: 4.8 Mil/uL (ref 4.22–5.81)
RDW: 12.9 % (ref 11.5–15.5)
WBC: 7.9 10*3/uL (ref 4.0–10.5)

## 2022-03-29 LAB — HEMOGLOBIN A1C: Hgb A1c MFr Bld: 7.2 % — ABNORMAL HIGH (ref 4.6–6.5)

## 2022-03-29 NOTE — Progress Notes (Signed)
Chief Complaint:  Timothy Dickson is a 76 y.o. male who presents today for his annual comprehensive physical exam.    Assessment/Plan:  Chronic Problems Addressed Today: Dyslipidemia associated with type 2 diabetes mellitus ([place]) Follows with cardiology.  On Repatha.  Hypertension associated with diabetes ([place]) At goal today on Coreg 6.25 mg twice daily and HCTZ 25 mg daily.  Controlled type 2 diabetes mellitus without complication, without long-term current use of insulin (HCC) Check A1c.  Discussed lifestyle modifications.  Not currently on any medications.   Preventative Healthcare: Check labs.  Will get flu vaccine, COVID-vaccine, and RSV vaccine later this year.  Patient Counseling(The following topics were reviewed and/or handout was given):  -Nutrition: Stressed importance of moderation in sodium/caffeine intake, saturated fat and cholesterol, caloric balance, sufficient intake of fresh fruits, vegetables, and fiber.  -Stressed the importance of regular exercise.   -Substance Abuse: Discussed cessation/primary prevention of tobacco, alcohol, or other drug use; driving or other dangerous activities under the influence; availability of treatment for abuse.   -Injury prevention: Discussed safety belts, safety helmets, smoke detector, smoking near bedding or upholstery.   -Sexuality: Discussed sexually transmitted diseases, partner selection, use of condoms, avoidance of unintended pregnancy and contraceptive alternatives.   -Dental health: Discussed importance of regular tooth brushing, flossing, and dental visits.  -Health maintenance and immunizations reviewed. Please refer to Health maintenance section.  Return to care in 1 year for next preventative visit.     Subjective:  HPI:  He has no acute complaints today.   Lifestyle Diet: Balanced. Plenty of fruits and vegetables.  Exercise: Likes to ride bike.      03/29/2022    1:05 PM  Depression screen PHQ 2/9   Decreased Interest 0  Down, Depressed, Hopeless 0  PHQ - 2 Score 0    Health Maintenance Due  Topic Date Due   URINE MICROALBUMIN  09/11/2021   HEMOGLOBIN A1C  03/28/2022     ROS: Per HPI, otherwise a complete review of systems was negative.   PMH:  The following were reviewed and entered/updated in epic: Past Medical History:  Diagnosis Date   Atrial fibrillation ([place]) 2019   Atrial tachycardia ([place])    documented by holter 2016. Followed by Dr. Liane Comber and Dr. Rayann Heman.   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 no further tx other than surgery [facility].   Cirrhosis ([place])    [facility] dx, early stage- [facility] visits at [facility]   CRI (chronic renal insufficiency)    s/p R nephrectomy in 1999 for malignancy   Diabetes mellitus without complication ([place])    NIDDM- dx. 3-4 years ago diet control   Diverticulosis of colon    mild   GERD (gastroesophageal reflux disease)    Headache(784.0)    Hepatitis C    s/p therapy [facility] (curative)/with Harvoni   History of BPH    no recent issues   History of substance abuse ([place])    past history -none in 38 yrs   Hyperglycemia    Hyperlipemia    Hypertension    Osteoarthritis, knee    knees bilaterally    Pneumonia 1975   hx of 35 years ago   Patient Active Problem List   Diagnosis Date Noted   Neuropathy 09/28/2021   Controlled type 2 diabetes mellitus without complication, without long-term current use of insulin ([place]) 09/11/2020   Gout 09/11/2020   Persistent atrial fibrillation ([place]) 09/06/2019   Secondary hypercoagulable state ([place]) 09/06/2019  AI (aortic insufficiency) 12/21/2015   Ascending aortic aneurysm ([place]) 12/21/2015   History of renal cell cancer s/p right nephrectomy 1999 04/16/2015   Ulnar neuropathy of both upper extremities 10/13/2014   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) ([place]) 08/29/2014   Dyslipidemia associated with type 2 diabetes mellitus ([place]) 10/28/2013   Hypertension  associated with diabetes ([place]) 10/04/2012   Chronic low back pain 05/22/2012   OA (osteoarthritis) of knee 10/26/2007   Past Surgical History:  Procedure Laterality Date   Arthroscopy right knee     may '11 (Dr Alvan Dame)   Arthroscopy, left knee,     ATRIAL FIBRILLATION ABLATION N/A 10/11/2018   Procedure: ATRIAL FIBRILLATION ABLATION;  Surgeon: Thompson Grayer, MD;  Location: [place] [facility];  Service: Cardiovascular;  Laterality: N/A;   ATRIAL FIBRILLATION ABLATION N/A 06/19/2020   Procedure: ATRIAL FIBRILLATION ABLATION;  Surgeon: Thompson Grayer, MD;  Location: [place] [facility];  Service: Cardiovascular;  Laterality: N/A;   CARDIAC ELECTROPHYSIOLOGY MAPPING AND ABLATION     history of liver biopsy  2007   History of Nephrectomy Right 1999   implantable loop recorder placement  03/19/2020    Medtronic Reveal Linq model LNQ 22 (RLB [id number] ) implantable loop recorder implanted by Dr Rayann Heman for evaluation of palpitations and afib management post ablation   KNEE ARTHROSCOPY Left 10/10/2012   Procedure: LEFT KNEE ARTHROSCOPY WITH MENSCIAL DEBRIDEMENT AND CHONDROPLASTY;  Surgeon: Gearlean Alf, MD;  [facility];  Service: Orthopedics;  Laterality: Left;   left shoulder repair for bone spur  1990's   TEE WITHOUT CARDIOVERSION N/A 10/11/2018   Procedure: TRANSESOPHAGEAL ECHOCARDIOGRAM (TEE);  Surgeon: Buford Dresser, MD;  Location: [facility] ENDOSCOPY;  Service: Cardiovascular;  Laterality: N/A;  ablation to follow at [place] Right 08/15/2016   Procedure: RIGHT TOTAL KNEE ARTHROPLASTY, CORTISONE INJECTION OF LEFT KNEE;  Surgeon: Gaynelle Arabian, MD;  [facility];  Service: Orthopedics;  Laterality: Right;   TOTAL KNEE ARTHROPLASTY Left 09/04/2017   Procedure: LEFT TOTAL KNEE ARTHROPLASTY;  Surgeon: Gaynelle Arabian, MD;  [facility];  Service: Orthopedics;  Laterality: Left;   Transurethral needle ablation procedure     TUNA procedure      Family History   Problem Relation Age of Onset   Coronary artery disease Mother    CVA Mother    Alcohol abuse Father        variceal hemorrhage   Liver disease Father        alcohol related   Alcohol abuse Sister    Diabetes Sister    Breast cancer Sister    Lung cancer Sister    Esophageal cancer Sister    Cancer Brother        head and neck   Alcohol abuse Other        Strong family Hx   Colon cancer Neg Hx    Pancreatic cancer Neg Hx     Medications- reviewed and updated Current Outpatient Medications  Medication Sig Dispense Refill   acetaminophen (TYLENOL) 650 MG CR tablet Take 650 mg by mouth See admin instructions. Take 650 mg at 11 am and take 650 mg at 4-5 am (take with gabapentin)     allopurinol (ZYLOPRIM) 100 MG tablet TAKE ONE TABLET BY MOUTH DAILY 90 tablet 3   Aloe-Sodium Chloride (AYR SALINE NASAL GEL NA) Place 1 application into the nose at bedtime.     apixaban (ELIQUIS) 5 MG TABS tablet Take 1 tablet (  5 mg total) by mouth 2 (two) times daily. 60 tablet 11   b complex vitamins capsule Take 1 capsule by mouth daily.     carvedilol (COREG) 6.25 MG tablet Take 1 tablet (6.25 mg total) by mouth 2 (two) times daily. 180 tablet 3   Evolocumab (REPATHA SURECLICK) 481 MG/ML SOAJ INJECT CONTENTS OF 1 PEN SUBCUTANEOUSLY EVERY 14 DAYS. 6 mL 3   famotidine (PEPCID) 20 MG tablet Take 20 mg by mouth daily as needed for heartburn.      flecainide (TAMBOCOR) 100 MG tablet Take 1 tablet (100 mg total) by mouth 2 (two) times daily. 180 tablet 3   gabapentin (NEURONTIN) 800 MG tablet TAKE ONE TABLET BY MOUTH TWICE DAILY 180 tablet 1   hydrochlorothiazide (HYDRODIURIL) 25 MG tablet Take 1 tablet (25 mg total) by mouth daily. 90 tablet 3   omeprazole (PRILOSEC) 20 MG capsule Take 20 mg by mouth every other day.      sodium chloride (OCEAN) 0.65 % SOLN nasal spray Place 1 spray into both nostrils daily.     No current facility-administered medications for this visit.    Allergies-reviewed and  updated Allergies  Allergen Reactions   Tramadol Hcl     Other reaction(s): ineffective   Acetaminophen Other (See Comments)    Pt states he was told by [facility] MD's to keep tylenol dosage to 2000 mg per 24 hours due to cirrhosis of liver.   Nsaids Other (See Comments)    Avoid due to having 1 kidney    Pravastatin Other (See Comments)    Pt reports this med makes him generally feel unwell.    Social History   Socioeconomic History   Marital status: Married    Spouse name: Not on file   Number of children: Not on file   Years of education: Not on file   Highest education level: Not on file  Occupational History   Not on file  Tobacco Use   Smoking status: Former    Packs/day: 1.00    Years: 15.00    Total pack years: 15.00    Types: Cigarettes    Quit date: 08/01/1977    Years since quitting: 44.6   Smokeless tobacco: Never  Vaping Use   Vaping Use: Never used  Substance and Sexual Activity   Alcohol use: No    Comment: past history -none in 38 yrs   Drug use: No    Comment: past hx none in 38 yrs   Sexual activity: Yes    Partners: Female  Other Topics Concern   Not on file  Social History Narrative   [place], [place], [place] psych. Married- '84. 2 step-daughters, 4 grandchildren. Work  Retired Radio producer  Marriage in good health.   Two story home   Drinks caffeine   Ambidextrous    Social Determinants of Health   Financial Resource Strain: Not on file  Food Insecurity: Not on file  Transportation Needs: Not on file  Physical Activity: Not on file  Stress: Not on file  Social Connections: Not on file        Objective:  Physical Exam: BP 133/83   Pulse 64   Temp 97.9 F (36.6 C) (Temporal)   Ht 6' (1.829 m)   Wt 211 lb 12.8 oz (96.1 kg)   SpO2 96%   BMI 28.73 kg/m   Body mass index is 28.73 kg/m. Wt Readings from Last 3 Encounters:  03/29/22 211 lb 12.8  oz (96.1 kg)  02/04/22 211 lb 12.8 oz (96.1 kg)  11/30/21 212 lb  (96.2 kg)   Gen: NAD, resting comfortably HEENT: TMs normal bilaterally. OP clear. No thyromegaly noted.  CV: RRR with no murmurs appreciated Pulm: NWOB, CTAB with no crackles, wheezes, or rhonchi GI: Normal bowel sounds present. Soft, Nontender, Nondistended. MSK: no edema, cyanosis, or clubbing noted Skin: warm, dry Neuro: CN2-12 grossly intact. Strength 5/5 in upper and lower extremities. Reflexes symmetric and intact bilaterally.  Psych: Normal affect and thought content     Jhene Westmoreland M. Jerline Pain, MD 03/29/2022 1:28 PM

## 2022-03-29 NOTE — Assessment & Plan Note (Signed)
At goal today on Coreg 6.25 mg twice daily and HCTZ 25 mg daily.

## 2022-03-29 NOTE — Patient Instructions (Signed)
It was very nice to see you today!  We will check blood work and a urine sample today.  Please continue to work on diet and exercise.  I will see you back in 6 months.  Come back sooner if needed.  Take care, Dr Jerline Pain  PLEASE NOTE:  If you had any lab tests please let us know if you have not heard back within a few days. You may see your results on mychart before we have a chance to review them but we will give you a call once they are reviewed by [place]. If we ordered any referrals today, please let us know if you have not heard from their office within the next week.   Please try these tips to maintain a healthy lifestyle:  Eat at least 3 REAL meals and 1-2 snacks per day.  Aim for no more than 5 hours between eating.  If you eat breakfast, please do so within one hour of getting up.   Each meal should contain half fruits/vegetables, one quarter protein, and one quarter carbs (no bigger than a computer mouse)  Cut down on sweet beverages. This includes juice, soda, and sweet tea.   Drink at least 1 glass of water with each meal and aim for at least 8 glasses per day  Exercise at least 150 minutes every week.    Preventive Care 67 Years and Older, Male Preventive care refers to lifestyle choices and visits with your health care provider that can promote health and wellness. Preventive care visits are also called wellness exams. What can I expect for my preventive care visit? Counseling During your preventive care visit, your health care provider may ask about your: Medical history, including: Past medical problems. Family medical history. History of falls. Current health, including: Emotional well-being. Home life and relationship well-being. Sexual activity. Memory and ability to understand (cognition). Lifestyle, including: Alcohol, nicotine or tobacco, and drug use. Access to firearms. Diet, exercise, and sleep habits. Work and work Statistician. Sunscreen use. Safety  issues such as seatbelt and bike helmet use. Physical exam Your health care provider will check your: Height and weight. These may be used to calculate your BMI (body mass index). BMI is a measurement that tells if you are at a healthy weight. Waist circumference. This measures the distance around your waistline. This measurement also tells if you are at a healthy weight and may help predict your risk of certain diseases, such as type 2 diabetes and high blood pressure. Heart rate and blood pressure. Body temperature. Skin for abnormal spots. What immunizations do I need?  Vaccines are usually given at various ages, according to a schedule. Your health care provider will recommend vaccines for you based on your age, medical history, and lifestyle or other factors, such as travel or where you work. What tests do I need? Screening Your health care provider may recommend screening tests for certain conditions. This may include: Lipid and cholesterol levels. Diabetes screening. This is done by checking your blood sugar (glucose) after you have not eaten for a while (fasting). Hepatitis C test. Hepatitis B test. HIV (human immunodeficiency virus) test. STI (sexually transmitted infection) testing, if you are at risk. Lung cancer screening. Colorectal cancer screening. Prostate cancer screening. Abdominal aortic aneurysm (AAA) screening. You may need this if you are a current or former smoker. Talk with your health care provider about your test results, treatment options, and if necessary, the need for more tests. Follow these instructions at home:  Eating and drinking  Eat a diet that includes fresh fruits and vegetables, whole grains, lean protein, and low-fat dairy products. Limit your intake of foods with high amounts of sugar, saturated fats, and salt. Take vitamin and mineral supplements as recommended by your health care provider. Do not drink alcohol if your health care provider tells  you not to drink. If you drink alcohol: Limit how much you have to 0-2 drinks a day. Know how much alcohol is in your drink. In the [place] one drink equals one 12 oz bottle of beer (355 mL), one 5 oz glass of wine (148 mL), or one 1 oz glass of hard liquor (44 mL). Lifestyle Brush your teeth every morning and night with fluoride toothpaste. Floss one time each day. Exercise for at least 30 minutes 5 or more days each week. Do not use any products that contain nicotine or tobacco. These products include cigarettes, chewing tobacco, and vaping devices, such as e-cigarettes. If you need help quitting, ask your health care provider. Do not use drugs. If you are sexually active, practice safe sex. Use a condom or other form of protection to prevent STIs. Take aspirin only as told by your health care provider. Make sure that you understand how much to take and what form to take. Work with your health care provider to find out whether it is safe and beneficial for you to take aspirin daily. Ask your health care provider if you need to take a cholesterol-lowering medicine (statin). Find healthy ways to manage stress, such as: Meditation, yoga, or listening to music. Journaling. Talking to a trusted person. Spending time with friends and family. Safety Always wear your seat belt while driving or riding in a vehicle. Do not drive: If you have been drinking alcohol. Do not ride with someone who has been drinking. When you are tired or distracted. While texting. If you have been using any mind-altering substances or drugs. Wear a helmet and other protective equipment during sports activities. If you have firearms in your house, make sure you follow all gun safety procedures. Minimize exposure to UV radiation to reduce your risk of skin cancer. What's next? Visit your health care provider once a year for an annual wellness visit. Ask your health care provider how often you should have your eyes and  teeth checked. Stay up to date on all vaccines. This information is not intended to replace advice given to you by your health care provider. Make sure you discuss any questions you have with your health care provider. Document Revised: 01/13/2021 Document Reviewed: 01/13/2021 Elsevier Patient Education  [place].

## 2022-03-29 NOTE — Assessment & Plan Note (Signed)
Follows with cardiology.  On Repatha.

## 2022-03-29 NOTE — Assessment & Plan Note (Signed)
Check A1c.  Discussed lifestyle modifications.  Not currently on any medications.

## 2022-03-30 LAB — COMPREHENSIVE METABOLIC PANEL
ALT: 16 U/L (ref 0–53)
AST: 22 U/L (ref 0–37)
Albumin: 4.7 g/dL (ref 3.5–5.2)
Alkaline Phosphatase: 59 U/L (ref 39–117)
BUN: 25 mg/dL — ABNORMAL HIGH (ref 6–23)
CO2: 25 mEq/L (ref 19–32)
Calcium: 9.8 mg/dL (ref 8.4–10.5)
Chloride: 95 mEq/L — ABNORMAL LOW (ref 96–112)
Creatinine, Ser: 1.18 mg/dL (ref 0.40–1.50)
GFR: 59.96 mL/min — ABNORMAL LOW (ref >60.00)
Glucose, Bld: 112 mg/dL — ABNORMAL HIGH (ref 70–99)
Potassium: 3.9 mEq/L (ref 3.5–5.1)
Sodium: 133 mEq/L — ABNORMAL LOW (ref 135–145)
Total Bilirubin: 0.8 mg/dL (ref 0.2–1.2)
Total Protein: 7.5 g/dL (ref 6.0–8.3)

## 2022-03-30 LAB — LIPID PANEL
Cholesterol: 104 mg/dL (ref 0–200)
HDL: 47.3 mg/dL (ref >39.00)
LDL Cholesterol: 40 mg/dL (ref 0–99)
NonHDL: 56.88
Total CHOL/HDL Ratio: 2
Triglycerides: 82 mg/dL (ref 0.0–149.0)
VLDL: 16.4 mg/dL (ref 0.0–40.0)

## 2022-03-30 LAB — MICROALBUMIN / CREATININE URINE RATIO
Creatinine,U: 23.5 mg/dL
Microalb Creat Ratio: 3 mg/g (ref 0.0–30.0)
Microalb, Ur: <0.7 mg/dL (ref 0.0–1.9)

## 2022-03-31 ENCOUNTER — Other Ambulatory Visit: Payer: Self-pay | Admitting: Cardiology

## 2022-03-31 ENCOUNTER — Encounter: Payer: Self-pay | Admitting: Cardiology

## 2022-03-31 MED ORDER — DIAZEPAM 5 MG PO TABS: 5.0000 mg | ORAL_TABLET | Freq: Once | ORAL | 0 refills | Status: AC

## 2022-03-31 NOTE — Telephone Encounter (Signed)
Phoned in the pts pre-medication for Valium 5 mg--take 1 tablet by mouth for one dose, take 1 hour prior to MRA.   Spoke with Pharmacist Brooke at [facility] and phoned this in for the pt.   Pt is aware of this.  Pt aware we will cancel his 9/5 lab appt with our office, being he had this done recently by his PCP (visible in epic).  Pt aware of plan and agrees with this.

## 2022-03-31 NOTE — Progress Notes (Signed)
Please inform patient of the following:  Labs are all stable.  Do not need to make any changes to treatment plan at this time.  We will see him back in 6 months.

## 2022-04-05 ENCOUNTER — Other Ambulatory Visit: Payer: Medicare Other

## 2022-04-06 ENCOUNTER — Encounter: Payer: Self-pay | Admitting: Cardiology

## 2022-04-06 ENCOUNTER — Encounter: Payer: Self-pay | Admitting: Family Medicine

## 2022-04-06 NOTE — Progress Notes (Signed)
Carelink Summary Report / Loop Recorder 

## 2022-04-08 ENCOUNTER — Ambulatory Visit (HOSPITAL_COMMUNITY)
Admission: RE | Admit: 2022-04-08 | Discharge: 2022-04-08 | Disposition: A | Discharge status: Home / Self Care | Payer: Medicare Other | Source: Ambulatory Visit | Attending: Cardiology | Admitting: Cardiology

## 2022-04-08 ENCOUNTER — Telehealth: Payer: Self-pay | Admitting: *Deleted

## 2022-04-08 DIAGNOSIS — I712 Thoracic aortic aneurysm, without rupture, unspecified: Secondary | ICD-10-CM | POA: Diagnosis not present

## 2022-04-08 DIAGNOSIS — I7121 Aneurysm of the ascending aorta, without rupture: Secondary | ICD-10-CM

## 2022-04-08 DIAGNOSIS — Z0189 Encounter for other specified special examinations: Secondary | ICD-10-CM

## 2022-04-08 MED ORDER — GADOBUTROL 1 MMOL/ML IV SOLN
10.0000 mL | Freq: Once | INTRAVENOUS | Status: AC | PRN
Start: 2022-04-08 — End: 2022-04-08
  Administered 2022-04-08: 10 mL via INTRAVENOUS

## 2022-04-08 NOTE — Telephone Encounter (Signed)
The patient has been notified of the result and verbalized understanding.  All questions (if any) were answered.  Pt aware I will go ahead and place the order for repeat MRA of the Chest to be done in one year.  Pt aware that I will send a message to our [facility] Schedulers to call him back and arrange this appt for that time.  Will place future bmet as well.  Pt verbalized understanding and agrees with this plan.

## 2022-04-08 NOTE — Telephone Encounter (Signed)
-----   Message from Freada Bergeron, MD sent at 04/08/2022  4:13 PM EDT ----- The MRA scan of his aorta shows stable aorta size at 41m. Will continue with annual monitoring with MRAs.

## 2022-04-11 ENCOUNTER — Ambulatory Visit (INDEPENDENT_AMBULATORY_CARE_PROVIDER_SITE_OTHER): Payer: Medicare Other

## 2022-04-11 DIAGNOSIS — I4819 Other persistent atrial fibrillation: Secondary | ICD-10-CM | POA: Diagnosis not present

## 2022-04-12 LAB — CUP PACEART REMOTE DEVICE CHECK
Date Time Interrogation Session: 20230911081727
Implantable Pulse Generator Implant Date: 20210819

## 2022-04-23 ENCOUNTER — Encounter (HOSPITAL_COMMUNITY): Payer: Self-pay | Admitting: *Deleted

## 2022-04-23 ENCOUNTER — Ambulatory Visit (HOSPITAL_COMMUNITY)
Admission: EM | Admit: 2022-04-23 | Discharge: 2022-04-23 | Disposition: A | Discharge status: Home / Self Care | Payer: Medicare Other | Attending: Physician Assistant | Admitting: Physician Assistant

## 2022-04-23 ENCOUNTER — Other Ambulatory Visit: Payer: Self-pay

## 2022-04-23 DIAGNOSIS — U071 COVID-19: Secondary | ICD-10-CM | POA: Insufficient documentation

## 2022-04-23 DIAGNOSIS — J069 Acute upper respiratory infection, unspecified: Secondary | ICD-10-CM

## 2022-04-23 MED ORDER — FLUTICASONE PROPIONATE 50 MCG/ACT NA SUSP: 1.0000 | Freq: Every day | NASAL | 0 refills | Status: DC

## 2022-04-23 MED ORDER — BENZONATATE 100 MG PO CAPS: 100.0000 mg | ORAL_CAPSULE | Freq: Three times a day (TID) | ORAL | 0 refills | Status: DC

## 2022-04-23 NOTE — ED Provider Notes (Signed)
[facility]    CSN: [id number] Arrival date & time: 04/23/22  1430      History   Chief Complaint Chief Complaint  Patient presents with   Shortness of Breath   sinus pressure   Cough    HPI Timothy Dickson is a 76 y.o. male.   Patient presents today with a 24-hour history of URI symptoms.  He recently traveled from [place] after visiting family who have also become ill.  He did not take an at-home COVID test.  He had a COVID several years ago.  He has had vaccines.  He reports shortness of breath, cough, sinus pressure.  Denies any fever, chest pain, nausea, vomiting, weakness.  He has been using over-the-counter medication including Tylenol and sinus medication without improvement of symptoms.  He does have a history of cardiovascular history and is on Eliquis and flecainide.  He denies any recent antibiotic or steroid use.    Past Medical History:  Diagnosis Date   Atrial fibrillation ([place]) 2019   Atrial tachycardia ([place])    documented by holter 2016. Followed by Dr. Liane Comber and Dr. Rayann Heman.   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 no further tx other than surgery [facility]   Cirrhosis ([place])    [facility] dx, early stage- follow up visits at [facility].   CRI (chronic renal insufficiency)    s/p R nephrectomy in 1999 for malignancy   Diabetes mellitus without complication (HCC)    NIDDM- dx. 3-4 years ago diet control   Diverticulosis of colon    mild   GERD (gastroesophageal reflux disease)    Headache(784.0)    Hepatitis C    s/p therapy [facility] (curative)/with Harvoni   History of BPH    no recent issues   History of substance abuse ([place])    past history -none in 38 yrs   Hyperglycemia    Hyperlipemia    Hypertension    Osteoarthritis, knee    knees bilaterally    Pneumonia 1975   hx of 35 years ago    Patient Active Problem List   Diagnosis Date Noted   Neuropathy 09/28/2021   Controlled type 2 diabetes mellitus without complication, without  long-term current use of insulin ([place]) 09/11/2020   Gout 09/11/2020   Persistent atrial fibrillation ([place]) 09/06/2019   Secondary hypercoagulable state ([place]) 09/06/2019   AI (aortic insufficiency) 12/21/2015   Ascending aortic aneurysm ([place]) 12/21/2015   History of renal cell cancer s/p right nephrectomy 1999 04/16/2015   Ulnar neuropathy of both upper extremities 10/13/2014   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) ([place]) 08/29/2014   Dyslipidemia associated with type 2 diabetes mellitus ([place]) 10/28/2013   Hypertension associated with diabetes ([place]) 10/04/2012   Chronic low back pain 05/22/2012   OA (osteoarthritis) of knee 10/26/2007    Past Surgical History:  Procedure Laterality Date   Arthroscopy right knee     may '11 (Dr Alvan Dame)   Arthroscopy, left knee,     ATRIAL FIBRILLATION ABLATION N/A 10/11/2018   Procedure: ATRIAL FIBRILLATION ABLATION;  Surgeon: Thompson Grayer, MD;  Location: [facility];  Service: Cardiovascular;  Laterality: N/A;   ATRIAL FIBRILLATION ABLATION N/A 06/19/2020   Procedure: ATRIAL FIBRILLATION ABLATION;  Surgeon: Thompson Grayer, MD;  Location: [facility];  Service: Cardiovascular;  Laterality: N/A;   CARDIAC ELECTROPHYSIOLOGY MAPPING AND ABLATION     history of liver biopsy  2007   History of Nephrectomy Right 1999  implantable loop recorder placement  03/19/2020    Medtronic Reveal Linq model LNQ 22 ([id number] ) implantable loop recorder implanted by Dr Rayann Heman for evaluation of palpitations and afib management post ablation   KNEE ARTHROSCOPY Left 10/10/2012   Procedure: LEFT KNEE ARTHROSCOPY WITH MENSCIAL DEBRIDEMENT AND CHONDROPLASTY;  Surgeon: Gearlean Alf, MD;  Location: [facility];  Service: Orthopedics;  Laterality: Left;   left shoulder repair for bone spur  1990's   TEE WITHOUT CARDIOVERSION N/A 10/11/2018   Procedure: TRANSESOPHAGEAL ECHOCARDIOGRAM (TEE);  Surgeon: Buford Dresser, MD;  Location: [facility];   Service: Cardiovascular;  Laterality: N/A;  ablation to follow at [facility] Right 08/15/2016   Procedure: RIGHT TOTAL KNEE ARTHROPLASTY, CORTISONE INJECTION OF LEFT KNEE;  Surgeon: Gaynelle Arabian, MD;  Location: [facility];  Service: Orthopedics;  Laterality: Right;   TOTAL KNEE ARTHROPLASTY Left 09/04/2017   Procedure: LEFT TOTAL KNEE ARTHROPLASTY;  Surgeon: Gaynelle Arabian, MD;  Location: [facility];  Service: Orthopedics;  Laterality: Left;   Transurethral needle ablation procedure     TUNA procedure         Home Medications    Prior to Admission medications   Medication Sig Start Date End Date Taking? Authorizing Provider  benzonatate (TESSALON) 100 MG capsule Take 1 capsule (100 mg total) by mouth every 8 (eight) hours. 04/23/22  Yes Zorina Mallin K, PA-C  fluticasone (FLONASE) 50 MCG/ACT nasal spray Place 1 spray into both nostrils daily. 04/23/22  Yes Mylon Mabey, Derry Skill, PA-C  acetaminophen (TYLENOL) 650 MG CR tablet Take 650 mg by mouth See admin instructions. Take 650 mg at 11 am and take 650 mg at 4-5 am (take with gabapentin)    [provider]  allopurinol (ZYLOPRIM) 100 MG tablet TAKE ONE TABLET BY MOUTH DAILY 10/25/21   [name], MD  Aloe-Sodium Chloride (AYR SALINE NASAL GEL NA) Place 1 application into the nose at bedtime.    [provider]  apixaban (ELIQUIS) 5 MG TABS tablet Take 1 tablet (5 mg total) by mouth 2 (two) times daily. 02/04/22   Evans Lance, MD  b complex vitamins capsule Take 1 capsule by mouth daily.    [provider]  carvedilol (COREG) 6.25 MG tablet Take 1 tablet (6.25 mg total) by mouth 2 (two) times daily. 02/04/22   Evans Lance, MD  Evolocumab (REPATHA SURECLICK) 423 MG/ML SOAJ INJECT CONTENTS OF 1 PEN SUBCUTANEOUSLY EVERY 14 DAYS. 01/12/22   Freada Bergeron, MD  famotidine (PEPCID) 20 MG tablet Take 20 mg by mouth daily as needed for heartburn.     [provider]  flecainide (TAMBOCOR) 100 MG  tablet Take 1 tablet (100 mg total) by mouth 2 (two) times daily. 02/04/22   Evans Lance, MD  gabapentin (NEURONTIN) 800 MG tablet TAKE ONE TABLET BY MOUTH TWICE DAILY 01/03/22   [name], MD  hydrochlorothiazide (HYDRODIURIL) 25 MG tablet Take 1 tablet (25 mg total) by mouth daily. 02/04/22   Evans Lance, MD  omeprazole (PRILOSEC) 20 MG capsule Take 20 mg by mouth every other day.     [provider]  sodium chloride (OCEAN) 0.65 % SOLN nasal spray Place 1 spray into both nostrils daily.    [provider]    Family History Family History  Problem Relation Age of Onset   Coronary artery disease Mother    CVA Mother    Alcohol abuse Father  variceal hemorrhage   Liver disease Father        alcohol related   Alcohol abuse Sister    Diabetes Sister    Breast cancer Sister    Lung cancer Sister    Esophageal cancer Sister    Cancer Brother        head and neck   Alcohol abuse Other        Strong family Hx   Colon cancer Neg Hx    Pancreatic cancer Neg Hx     Social History Social History   Tobacco Use   Smoking status: Former    Packs/day: 1.00    Years: 15.00    Total pack years: 15.00    Types: Cigarettes    Quit date: 08/01/1977    Years since quitting: 44.7   Smokeless tobacco: Never  Vaping Use   Vaping Use: Never used  Substance Use Topics   Alcohol use: No    Comment: past history -none in 41 yrs   Drug use: No    Comment: past hx none in 38 yrs     Allergies   Tramadol hcl, Acetaminophen, Nsaids, and Pravastatin   Review of Systems Review of Systems  Constitutional:  Positive for activity change. Negative for appetite change, fatigue and fever.  HENT:  Positive for congestion and sore throat. Negative for sinus pressure and sneezing.   Respiratory:  Positive for cough and shortness of breath.   Cardiovascular:  Negative for chest pain.  Gastrointestinal:  Negative for abdominal pain, diarrhea, nausea and vomiting.   Neurological:  Negative for dizziness, light-headedness and headaches.     Physical Exam Triage Vital Signs ED Triage Vitals  Enc Vitals Group     BP 04/23/22 1521 129/71     Pulse Rate 04/23/22 1521 (!) 51     Resp 04/23/22 1521 20     Temp 04/23/22 1521 98.7 F (37.1 C)     Temp src --      SpO2 04/23/22 1521 94 %     Weight --      Height --      Head Circumference --      Peak Flow --      Pain Score 04/23/22 1517 7     Pain Loc --      Pain Edu? --      Excl. in [place]? --    No data found.  Updated Vital Signs BP 129/71   Pulse (!) 51   Temp 98.7 F (37.1 C)   Resp 20   SpO2 94%   Visual Acuity Right Eye Distance:   Left Eye Distance:   Bilateral Distance:    Right Eye Near:   Left Eye Near:    Bilateral Near:     Physical Exam Vitals reviewed.  Constitutional:      General: He is awake.     Appearance: Normal appearance. He is well-developed. He is not ill-appearing.     Comments: Very pleasant male appears stated age in no acute distress sitting comfortably in exam room  HENT:     Head: Normocephalic and atraumatic.     Right Ear: Tympanic membrane, ear canal and external ear normal. Tympanic membrane is not erythematous or bulging.     Left Ear: Tympanic membrane, ear canal and external ear normal. Tympanic membrane is not erythematous or bulging.     Nose: Nose normal.     Mouth/Throat:     Pharynx: Uvula midline. Posterior oropharyngeal  erythema present. No oropharyngeal exudate.  Cardiovascular:     Rate and Rhythm: Normal rate and regular rhythm.     Heart sounds: Normal heart sounds, S1 normal and S2 normal. No murmur heard. Pulmonary:     Effort: Pulmonary effort is normal. No accessory muscle usage or respiratory distress.     Breath sounds: Normal breath sounds. No stridor. No wheezing, rhonchi or rales.     Comments: Clear to auscultation bilaterally Abdominal:     General: Bowel sounds are normal.     Palpations: Abdomen is soft.      Tenderness: There is no abdominal tenderness.  Neurological:     Mental Status: He is alert.  Psychiatric:        Behavior: Behavior is cooperative.      UC Treatments / Results  Labs (all labs ordered are listed, but only abnormal results are displayed) Labs Reviewed  RESP PANEL BY RT-PCR (RSV, FLU A&B, COVID)  [id number]    EKG   Radiology No results found.  Procedures Procedures (including critical care time)  Medications Ordered in UC Medications - No data to display  Initial Impression / Assessment and Plan / UC Course  I have reviewed the triage vital signs and the nursing notes.  Pertinent labs & imaging results that were available during my care of the patient were reviewed by me and considered in my medical decision making (see chart for details).     Patient is well-appearing, afebrile, nontoxic, nontachycardic.  Suspect viral etiology.  COVID/flu/RSV testing was obtained today-results pending.  Patient would benefit from Tamiflu if positive for influenza.  He would also benefit from antivirals for COVID-19 if positive for COVID.  Unfortunately, given his medications including flecainide he is unable to take Paxlovid.  Would consider molnupiravir.  He was given Flonase and Tessalon for cough.  He is to rest and drink plenty of fluid.  Discussed that if his symptoms worsen in any way he needs to return for reevaluation.  If symptoms not improving by next week he should see his primary care.  If he develops any chest pain, shortness of breath, nausea, vomiting, weakness he needs to go to the emergency room.  Strict return precautions given.  Final Clinical Impressions(s) / UC Diagnoses   Final diagnoses:  Viral URI     Discharge Instructions      Monitor your MyChart for your results.  We will contact you if anything is positive.  If you are interested, look up molnupiravir as you would maybe be a candidate for this if you are positive for COVID.  Use Tessalon for  cough.  Use Flonase for congestion.  Drink lots of fluid.  Use over-the-counter medications as needed.  If symptoms are not proving by next week return for reevaluation.  If anything worsens including chest pain, shortness of breath, headache, vision change, dizziness, weakness you need to go to the emergency room immediately.     ED Prescriptions     Medication Sig Dispense Auth. Provider   fluticasone (FLONASE) 50 MCG/ACT nasal spray Place 1 spray into both nostrils daily. 16 g Octavius Shin K, PA-C   benzonatate (TESSALON) 100 MG capsule Take 1 capsule (100 mg total) by mouth every 8 (eight) hours. 21 capsule Kamen Hanken K, PA-C      PDMP not reviewed this encounter.   Terrilee Croak, PA-C 04/23/22 1639

## 2022-04-23 NOTE — Discharge Instructions (Signed)
Monitor your MyChart for your results.  We will contact you if anything is positive.  If you are interested, look up molnupiravir as you would maybe be a candidate for this if you are positive for COVID.  Use Tessalon for cough.  Use Flonase for congestion.  Drink lots of fluid.  Use over-the-counter medications as needed.  If symptoms are not proving by next week return for reevaluation.  If anything worsens including chest pain, shortness of breath, headache, vision change, dizziness, weakness you need to go to the emergency room immediately.

## 2022-04-23 NOTE — ED Triage Notes (Signed)
Pt reports being on a plan 3 days ago. Pt now has [place] and sever sinus pressure.

## 2022-04-24 ENCOUNTER — Telehealth (HOSPITAL_COMMUNITY): Payer: Self-pay | Admitting: Physician Assistant

## 2022-04-24 LAB — RESP PANEL BY RT-PCR (RSV, FLU A&B, COVID)  RVPGX2
Influenza A by PCR: NEGATIVE
Influenza B by PCR: NEGATIVE
Resp Syncytial Virus by PCR: NEGATIVE
SARS Coronavirus 2 by RT PCR: POSITIVE — AB

## 2022-04-24 MED ORDER — MOLNUPIRAVIR EUA 200MG CAPSULE: 4.0000 | ORAL_CAPSULE | Freq: Two times a day (BID) | ORAL | 0 refills | Status: AC

## 2022-04-24 NOTE — Telephone Encounter (Signed)
Called and discussed positive results with patient and wife.  Molnupiravir sent to pharmacy given medication interactions with Paxlovid.  Discussed that if they have any worsening symptoms need to be seen immediately.  All questions answered to patient satisfaction.

## 2022-04-28 NOTE — Progress Notes (Signed)
Carelink Summary Report / Loop Recorder 

## 2022-05-11 LAB — CUP PACEART REMOTE DEVICE CHECK
Date Time Interrogation Session: 20231008195647
Implantable Pulse Generator Implant Date: 20210819

## 2022-05-15 NOTE — Progress Notes (Unsigned)
Cardiology Office Note:    Date:  05/15/2022   ID:  Timothy Dickson, DOB [birth year], MRN [id number]  PCP:  [name], MD   [facility] Providers Cardiologist:  Ena Dawley, MD Electrophysiologist:  Thompson Grayer, MD  {    Referring MD: [name], MD    History of Present Illness:    Timothy Dickson is a 76 y.o. male with a hx of  hypertension, ascending aortic aneurysm, hyperlipidemia, coronary calcifications, persistent atrial fibrillation and SVT who failed medical therapy (Flecainide) and underwent EPS/RFCA of atrial fibrillation in 09/2018 who now presents to clinic for follow-up. The patient was previously followed by Dr. Meda Coffee.  Per review of the record, the patient has a history of paroxysmal Afib/flutter s/p successful ablation in 09/2018 followed by Dr. Rayann Heman. ILR implanted in 2021 for palpitations shows 0% burden of Afib. Flec has been decreased to '50mg'$  BID per Dr. Rayann Heman. Last saw Dr. Rayann Heman on 12/23/20 where he was doing well without palpitations.  Was seen by Timothy Dickson on 03/242/23 where he was having vision changes. ILR showed 99/9% NSR.   Was last seen in clinic by Dr. Lovena Le on 02/04/22 where he was doing well. Was maintained on Flex '100mg'$  BID.  Today, ***  Past Medical History:  Diagnosis Date   Atrial fibrillation ([place]) 2019   Atrial tachycardia ([place])    documented by holter 2016. Followed by Dr. Liane Comber and Dr. Rayann Heman.   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 no further tx other than surgery [facility]   Cirrhosis ([place])    [facility] dx, early stage- follow up visits at [facility].   CRI (chronic renal insufficiency)    s/p R nephrectomy in 1999 for malignancy   Diabetes mellitus without complication (HCC)    NIDDM- dx. 3-4 years ago diet control   Diverticulosis of colon    mild   GERD (gastroesophageal reflux disease)    Headache(784.0)    Hepatitis C    s/p therapy [facility] (curative)/with Harvoni   History of BPH    no recent issues    History of substance abuse ([place])    past history -none in 38 yrs   Hyperglycemia    Hyperlipemia    Hypertension    Osteoarthritis, knee    knees bilaterally    Pneumonia 1975   hx of 35 years ago    Past Surgical History:  Procedure Laterality Date   Arthroscopy right knee     may '11 (Dr Alvan Dame)   Arthroscopy, left knee,     ATRIAL FIBRILLATION ABLATION N/A 10/11/2018   Procedure: ATRIAL FIBRILLATION ABLATION;  Surgeon: Thompson Grayer, MD;  Location: [place] [facility];  Service: Cardiovascular;  Laterality: N/A;   ATRIAL FIBRILLATION ABLATION N/A 06/19/2020   Procedure: ATRIAL FIBRILLATION ABLATION;  Surgeon: Thompson Grayer, MD;  Location: [place] [facility];  Service: Cardiovascular;  Laterality: N/A;   CARDIAC ELECTROPHYSIOLOGY MAPPING AND ABLATION     history of liver biopsy  2007   History of Nephrectomy Right 1999   implantable loop recorder placement  03/19/2020    Medtronic Reveal Linq model LNQ 22 (RLB [id number] ) implantable loop recorder implanted by Dr Rayann Heman for evaluation of palpitations and afib management post ablation   KNEE ARTHROSCOPY Left 10/10/2012   Procedure: LEFT KNEE ARTHROSCOPY WITH MENSCIAL DEBRIDEMENT AND CHONDROPLASTY;  Surgeon: Gearlean Alf, MD;  Location: [facility];  Service: Orthopedics;  Laterality: Left;   left shoulder repair for bone spur  1990's   TEE WITHOUT CARDIOVERSION N/A 10/11/2018   Procedure: TRANSESOPHAGEAL ECHOCARDIOGRAM (TEE);  Surgeon: Buford Dresser, MD;  Location: [facility];  Service: Cardiovascular;  Laterality: N/A;  ablation to follow at [place] Right 08/15/2016   Procedure: RIGHT TOTAL KNEE ARTHROPLASTY, CORTISONE INJECTION OF LEFT KNEE;  Surgeon: Gaynelle Arabian, MD;  Location: [facility];  Service: Orthopedics;  Laterality: Right;   TOTAL KNEE ARTHROPLASTY Left 09/04/2017   Procedure: LEFT TOTAL KNEE ARTHROPLASTY;  Surgeon: Gaynelle Arabian, MD;  Location: [facility];  Service: Orthopedics;  Laterality: Left;    Transurethral needle ablation procedure     TUNA procedure      Current Medications: No outpatient medications have been marked as taking for the 05/17/22 encounter (Appointment) with Freada Bergeron, MD.     Allergies:   Tramadol hcl, Acetaminophen, Nsaids, and Pravastatin   Social History   Socioeconomic History   Marital status: Married    Spouse name: Not on file   Number of children: Not on file   Years of education: Not on file   Highest education level: Not on file  Occupational History   Not on file  Tobacco Use   Smoking status: Former    Packs/day: 1.00    Years: 15.00    Total pack years: 15.00    Types: Cigarettes    Quit date: 08/01/1977    Years since quitting: 44.8   Smokeless tobacco: Never  Vaping Use   Vaping Use: Never used  Substance and Sexual Activity   Alcohol use: No    Comment: past history -none in 38 yrs   Drug use: No    Comment: past hx none in 38 yrs   Sexual activity: Yes    Partners: Female  Other Topics Concern   Not on file  Social History Narrative   [place], [place], [place] psych. Married- '84. 2 step-daughters, 4 grandchildren. Work  Retired Radio producer  Marriage in good health.   Two story home   Drinks caffeine   Ambidextrous    Social Determinants of Health   Financial Resource Strain: Not on file  Food Insecurity: Not on file  Transportation Needs: Not on file  Physical Activity: Not on file  Stress: Not on file  Social Connections: Not on file     Family History: The patient's family history includes Alcohol abuse in his father, sister, and another family member; Breast cancer in his sister; CVA in his mother; Cancer in his brother; Coronary artery disease in his mother; Diabetes in his sister; Esophageal cancer in his sister; Liver disease in his father; Lung cancer in his sister. There is no history of Colon cancer or Pancreatic cancer.  ROS:   Please see the history of present  illness.    Review of Systems  Constitutional:  Negative for chills and fever.  HENT:  Negative for congestion and sore throat.   Eyes:  Negative for blurred vision and pain.  Respiratory:  Negative for cough and shortness of breath.   Cardiovascular:  Negative for chest pain, palpitations, orthopnea, claudication, leg swelling and PND.  Gastrointestinal:  Negative for heartburn and vomiting.  Genitourinary:  Negative for frequency and urgency.  Musculoskeletal:  Negative for back pain and myalgias.  Skin:  Negative for itching and rash.  Neurological:  Positive for dizziness. Negative for headaches.  Endo/Heme/Allergies:  Negative for polydipsia. Does not bruise/bleed easily.  Psychiatric/Behavioral:  Negative for memory loss. The patient  does not have insomnia.     All other systems reviewed and are negative.  EKGs/Labs/Other Studies Reviewed:    The following studies were reviewed today: MRA 04/11/2022: IMPRESSION: Relatively unchanged size and configuration of the ascending aorta, estimated 43 cm on the current study. Recommend annual imaging followup by CTA or MRA. This recommendation follows 2010 ACCF/AHA/AATS/ACR/ASA/SCA/SCAI/SIR/STS/SVM Guidelines for the Diagnosis and Management of Patients with Thoracic Aortic Disease. Circulation. 2010; 121: W098-J191. Aortic aneurysm NOS (ICD10-I71.9) MRI 04/07/2021: IMPRESSION: Relatively unchanged size and configuration of the thoracic aorta, with the maximum estimated diameter of the ascending aorta 43 mm on the current MR study. Recommend annual imaging followup by CTA or MRA. This recommendation follows 2010 ACCF/AHA/AATS/ACR/ASA/SCA/SCAI/SIR/STS/SVM Guidelines for the Diagnosis and Management of Patients with Thoracic Aortic Disease. Circulation. 2010; 121: Y782-N562. Aortic aneurysm NOS (ICD10-I71.9) Incidental imaging of exophytic rounded lesion of the left kidney. This may represent a cyst that was identified on prior  ultrasound of 2014, however, the current study is nondiagnostic. Nonemergent abdominal ultrasound of the abdomen recommended given the patient's history of right-sided nephrectomy for RCC.  CT Cardiac Morph 06/15/20 1. There is normal pulmonary vein drainage into the left atrium. 2. The left atrial appendage is large with chicken wing morphology, 1 major lobe and ostial size 37 x 24 mm and length 56 mm. There is no thrombus in the left atrial appendage. 3. The esophagus runs to the left from the left atrial midline and is in the proximity to the ostia of the LUPV and LLPV. 4. Mild ascending aortic aneurysm with maximum diameter 41 mm. No dissection, mild diffuse atherosclerotic plaque and calcifications. 5. 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  Echo TEE 10/11/18 1. The left ventricle has low normal systolic function, with an ejection fraction of 50-55%. The cavity size was normal. No evidence of left ventricular regional wall motion abnormalities.   2. The right ventricle has normal systolc function. The cavity was normal. There is no increase in right ventricular wall thickness.   3. Left atrial size was mildly dilated.   4. Right atrial size was mildly dilated.   5. No evidence of mitral valve stenosis. No mitral valve vegetation visualized.   6. The aortic valve is tricuspid.   7. There is evidence of mild plaque in the descending aorta.   EKG:   06/22/21: EKG was not ordered today  Recent Labs: 03/29/2022: ALT 16; BUN 25; Creatinine, Ser 1.18; Hemoglobin 15.1; Platelets 212.0; Potassium 3.9; Sodium 133; TSH 1.03  Recent Lipid Panel    Component Value Date/Time   CHOL 104 03/29/2022 1333   CHOL 108 04/07/2020 1419   CHOL 206 (H) 12/21/2015 1522   TRIG 82.0 03/29/2022 1333   TRIG 161 (H) 12/21/2015 1522   HDL 47.30 03/29/2022 1333   HDL 48 04/07/2020 1419   HDL 45 12/21/2015 1522   CHOLHDL 2 03/29/2022 1333   VLDL 16.4 03/29/2022 1333   LDLCALC 40 03/29/2022 1333    LDLCALC 41 04/07/2020 1419   LDLCALC 129 12/21/2015 1522     Risk Assessment/Calculations:    CHA2DS2-VASc Score = 3   This indicates a 3.2% annual risk of stroke. The patient's score is based upon: CHF History: 0 HTN History: 1 Diabetes History: 0 Stroke History: 0 Vascular Disease History: 0 Age Score: 2 Gender Score: 0          Physical Exam:    VS:  There were no vitals taken for this visit.    Wt Readings  from Last 3 Encounters:  03/29/22 211 lb 12.8 oz (96.1 kg)  02/04/22 211 lb 12.8 oz (96.1 kg)  11/30/21 212 lb (96.2 kg)     GEN:  Well nourished, well developed in no acute distress HEENT: Normal NECK: No JVD; No carotid bruits CARDIAC: RRR, no murmurs, rubs, gallops RESPIRATORY:  Clear to auscultation without rales, wheezing or rhonchi  ABDOMEN: Soft, non-tender, non-distended MUSCULOSKELETAL:  No edema; No deformity  SKIN: Warm and dry NEUROLOGIC:  Alert and oriented x 3 PSYCHIATRIC:  Normal affect   ASSESSMENT:    No diagnosis found.  PLAN:    In order of problems listed above:  #Paroxysmal Afib/Flutter: CHADs-vasc 3. S/p ablation 09/2018 with <1% burden of Afib on ILR. On flex '100mg'$  BID and apixaban. Now following with Dr. Lovena Le -Follow-up with EP as scheduled -Continue flec '100mg'$  BID -Continue eliquis for [facility]  #HTN: Controlled. -Continue coreg 6.'25mg'$  BID -Continue HCTZ '25mg'$  daily  #HLD: -Continue repatha '140mg'$  q 2weeks  #AAA: Not on ASA due to need for [facility]. On repatha. MRI on 04/07/2022 shows max diameter 14m which is overall stable from prior.  Will need annual monitoring. -Continue annual imaging with MRA/CTA -Continue repatha  [facility] #Exophytic rounded lesion of left kidney: Noted incidentally on MRI. Follow-up ultrasound with no hepatic mass or acute process. -Follow-up with urology as scheduled      Medication Adjustments/Labs and Tests Ordered: Current medicines are reviewed at length with the  patient today.  Concerns regarding medicines are outlined above.  No orders of the defined types were placed in this encounter.  No orders of the defined types were placed in this encounter.   There are no Patient Instructions on file for this visit.    I,Mykaella Javier,acting as a scribe for HFreada Bergeron MD.,have documented all relevant documentation on the behalf of HFreada Bergeron MD,as directed by  HFreada Bergeron MD while in the presence of HFreada Bergeron MD.  I, HFreada Bergeron MD, have reviewed all documentation for this visit. The documentation on 05/15/22 for the exam, diagnosis, procedures, and orders are all accurate and complete.   Signed, HFreada Bergeron MD  05/15/2022 7:24 AM    [place]

## 2022-05-16 ENCOUNTER — Ambulatory Visit (INDEPENDENT_AMBULATORY_CARE_PROVIDER_SITE_OTHER): Payer: Medicare Other

## 2022-05-16 DIAGNOSIS — I4819 Other persistent atrial fibrillation: Secondary | ICD-10-CM

## 2022-05-17 ENCOUNTER — Ambulatory Visit: Payer: Medicare Other | Attending: Cardiology | Admitting: Cardiology

## 2022-05-17 ENCOUNTER — Encounter: Payer: Self-pay | Admitting: Cardiology

## 2022-05-17 VITALS — BP 114/70 | HR 66 | Ht 72.0 in | Wt 210.6 lb

## 2022-05-17 DIAGNOSIS — I7121 Aneurysm of the ascending aorta, without rupture: Secondary | ICD-10-CM

## 2022-05-17 DIAGNOSIS — E782 Mixed hyperlipidemia: Secondary | ICD-10-CM

## 2022-05-17 DIAGNOSIS — I10 Essential (primary) hypertension: Secondary | ICD-10-CM | POA: Diagnosis not present

## 2022-05-17 DIAGNOSIS — D6869 Other thrombophilia: Secondary | ICD-10-CM

## 2022-05-17 DIAGNOSIS — I4819 Other persistent atrial fibrillation: Secondary | ICD-10-CM | POA: Diagnosis not present

## 2022-05-17 NOTE — Patient Instructions (Signed)
Medication Instructions:  Your physician recommends that you continue on your current medications as directed. Please refer to the Current Medication list given to you today.  *If you need a refill on your cardiac medications before your next appointment, please call your pharmacy*   Lab Work: None ordered.  If you have labs (blood work) drawn today and your tests are completely normal, you will receive your results only by: [place] (if you have MyChart) OR A paper copy in the mail If you have any lab test that is abnormal or we need to change your treatment, we will call you to review the results.   Testing/Procedures: None ordered.    Follow-Up: At [facility], you and your health needs are our priority.  As part of our continuing mission to provide you with exceptional heart care, we have created designated Provider Care Teams.  These Care Teams include your primary Cardiologist (physician) and Advanced Practice Providers (APPs -  Physician Assistants and Nurse Practitioners) who all work together to provide you with the care you need, when you need it.  We recommend signing up for the patient portal called "MyChart".  Sign up information is provided on this After Visit Summary.  MyChart is used to connect with patients for Virtual Visits (Telemedicine).  Patients are able to view lab/test results, encounter notes, upcoming appointments, etc.  Non-urgent messages can be sent to your provider as well.   To learn more about what you can do with MyChart, go to [email or web address].    Your next appointment:   6 months with Dr Johney Frame  Important Information About Sugar

## 2022-05-17 NOTE — Progress Notes (Signed)
Cardiology Office Note:    Date:  05/17/2022   ID:  Timothy Dickson, DOB 04-Nov-1945, MRN [id number]  PCP:  [name], MD   [facility] Providers Cardiologist:  Ena Dawley, MD Electrophysiologist:  Thompson Grayer, MD  {    Referring MD: [name], MD    History of Present Illness:    Timothy Dickson is a 76 y.o. male with a hx of  hypertension, ascending aortic aneurysm, hyperlipidemia, coronary calcifications, persistent atrial fibrillation and SVT who failed medical therapy (Flecainide) and underwent EPS/RFCA of atrial fibrillation in 09/2018 who now presents to clinic for follow-up. The patient was previously followed by Dr. Meda Coffee.  Per review of the record, the patient has a history of paroxysmal Afib/flutter s/p successful ablation in 09/2018 followed by Dr. Rayann Heman. ILR implanted in 2021 for palpitations shows 0% burden of Afib. Flec has been decreased to '50mg'$  BID per Dr. Rayann Heman. Last saw Dr. Rayann Heman on 12/23/20 where he was doing well without palpitations.  Was seen by Tommye Standard on 03/242/23 where he was having vision changes. ILR showed 99.9% NSR.   Was last seen in clinic by Dr. Lovena Le on 02/04/22 where he was doing well. Was maintained on Flec '100mg'$  BID.  Today, the patient feels overall well. Continues to have intermittent episodes of Afib with longest episode lasting 1 hour. He has been compliant with his flecanide and apixaban. No bleeding issues. ILR with 0.4% Afib burnden.  Otherwise, he has been recovering after having Covid last month after traveling to [place]. Had some congestion and SOB at the time but is no significantly improved. No current chest pain or exertional dyspnea.  He is working on losing weight and has started the keto diet.  He denies any  chest pain, shortness of breath, or peripheral edema. No lightheadedness, headaches, syncope, orthopnea, or PND.   Past Medical History:  Diagnosis Date   Atrial fibrillation ([place]) 2019    Atrial tachycardia    documented by holter 2016. Followed by Dr. Liane Comber and Dr. Rayann Heman.   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 no further tx other than surgery [facility].   Cirrhosis ([place])    [facility] dx, early stage- follow up visits at [facility].   CRI (chronic renal insufficiency)    s/p R nephrectomy in 1999 for malignancy   Diabetes mellitus without complication (HCC)    NIDDM- dx. 3-4 years ago diet control   Diverticulosis of colon    mild   GERD (gastroesophageal reflux disease)    Headache(784.0)    Hepatitis C    s/p therapy [facility] (curative)/with Harvoni   History of BPH    no recent issues   History of substance abuse ([place])    past history -none in 38 yrs   Hyperglycemia    Hyperlipemia    Hypertension    Osteoarthritis, knee    knees bilaterally    Pneumonia 1975   hx of 35 years ago    Past Surgical History:  Procedure Laterality Date   Arthroscopy right knee     may '11 (Dr Alvan Dame)   Arthroscopy, left knee,     ATRIAL FIBRILLATION ABLATION N/A 10/11/2018   Procedure: ATRIAL FIBRILLATION ABLATION;  Surgeon: Thompson Grayer, MD;  Location: [place] [facility];  Service: Cardiovascular;  Laterality: N/A;   ATRIAL FIBRILLATION ABLATION N/A 06/19/2020   Procedure: ATRIAL FIBRILLATION ABLATION;  Surgeon: Thompson Grayer, MD;  Location: [place] [facility];  Service: Cardiovascular;  Laterality:  N/A;   CARDIAC ELECTROPHYSIOLOGY MAPPING AND ABLATION     history of liver biopsy  2007   History of Nephrectomy Right 1999   implantable loop recorder placement  03/19/2020    Medtronic Reveal Linq model LNQ 22 ([id number] ) implantable loop recorder implanted by Dr Rayann Heman for evaluation of palpitations and afib management post ablation   KNEE ARTHROSCOPY Left 10/10/2012   Procedure: LEFT KNEE ARTHROSCOPY WITH MENSCIAL DEBRIDEMENT AND CHONDROPLASTY;  Surgeon: Gearlean Alf, MD;  Location: [facility];  Service: Orthopedics;  Laterality: Left;   left shoulder repair for bone spur  1990's    TEE WITHOUT CARDIOVERSION N/A 10/11/2018   Procedure: TRANSESOPHAGEAL ECHOCARDIOGRAM (TEE);  Surgeon: Buford Dresser, MD;  Location: [facility];  Service: Cardiovascular;  Laterality: N/A;  ablation to follow at [place] Right 08/15/2016   Procedure: RIGHT TOTAL KNEE ARTHROPLASTY, CORTISONE INJECTION OF LEFT KNEE;  Surgeon: Gaynelle Arabian, MD;  Location: [facility];  Service: Orthopedics;  Laterality: Right;   TOTAL KNEE ARTHROPLASTY Left 09/04/2017   Procedure: LEFT TOTAL KNEE ARTHROPLASTY;  Surgeon: Gaynelle Arabian, MD;  Location: [facility];  Service: Orthopedics;  Laterality: Left;   Transurethral needle ablation procedure     TUNA procedure      Current Medications: Current Meds  Medication Sig   acetaminophen (TYLENOL) 650 MG CR tablet Take 650 mg by mouth See admin instructions. Take 650 mg at 11 am and take 650 mg at 4-5 am (take with gabapentin)   allopurinol (ZYLOPRIM) 100 MG tablet TAKE ONE TABLET BY MOUTH DAILY   Aloe-Sodium Chloride (AYR SALINE NASAL GEL NA) Place 1 application into the nose at bedtime.   apixaban (ELIQUIS) 5 MG TABS tablet Take 1 tablet (5 mg total) by mouth 2 (two) times daily.   b complex vitamins capsule Take 1 capsule by mouth daily.   carvedilol (COREG) 6.25 MG tablet Take 1 tablet (6.25 mg total) by mouth 2 (two) times daily.   Evolocumab (REPATHA SURECLICK) 294 MG/ML SOAJ INJECT CONTENTS OF 1 PEN SUBCUTANEOUSLY EVERY 14 DAYS.   famotidine (PEPCID) 20 MG tablet Take 20 mg by mouth daily as needed for heartburn.    flecainide (TAMBOCOR) 100 MG tablet Take 1 tablet (100 mg total) by mouth 2 (two) times daily.   fluticasone (FLONASE) 50 MCG/ACT nasal spray Place 1 spray into both nostrils daily.   gabapentin (NEURONTIN) 800 MG tablet TAKE ONE TABLET BY MOUTH TWICE DAILY   hydrochlorothiazide (HYDRODIURIL) 25 MG tablet Take 1 tablet (25 mg total) by mouth daily.   omeprazole (PRILOSEC) 20 MG capsule Take 20 mg by mouth every other day.     sodium chloride (OCEAN) 0.65 % SOLN nasal spray Place 1 spray into both nostrils daily.     Allergies:   Tramadol hcl, Acetaminophen, Nsaids, and Pravastatin   Social History   Socioeconomic History   Marital status: Married    Spouse name: Not on file   Number of children: Not on file   Years of education: Not on file   Highest education level: Not on file  Occupational History   Not on file  Tobacco Use   Smoking status: Former    Packs/day: 1.00    Years: 15.00    Total pack years: 15.00    Types: Cigarettes    Quit date: 08/01/1977    Years since quitting: 44.8   Smokeless tobacco: Never  Vaping Use   Vaping Use: Never used  Substance and Sexual Activity  Alcohol use: No    Comment: past history -none in 38 yrs   Drug use: No    Comment: past hx none in 38 yrs   Sexual activity: Yes    Partners: Female  Other Topics Concern   Not on file  Social History Narrative   [employer] BA, [place], [place] psych. Married- '84. 2 step-daughters, 4 grandchildren. Work  Retired Radio producer  Marriage in good health.   Two story home   Drinks caffeine   Ambidextrous    Social Determinants of Health   Financial Resource Strain: Not on file  Food Insecurity: Not on file  Transportation Needs: Not on file  Physical Activity: Not on file  Stress: Not on file  Social Connections: Not on file     Family History: The patient's family history includes Alcohol abuse in his father, sister, and another family member; Breast cancer in his sister; CVA in his mother; Cancer in his brother; Coronary artery disease in his mother; Diabetes in his sister; Esophageal cancer in his sister; Liver disease in his father; Lung cancer in his sister. There is no history of Colon cancer or Pancreatic cancer.  ROS:   Please see the history of present illness.    Review of Systems  Constitutional:  Negative for chills and fever.  HENT:  Negative for congestion and sore throat.    Eyes:  Negative for blurred vision and pain.  Respiratory:  Negative for cough and shortness of breath.   Cardiovascular:  Positive for palpitations. Negative for chest pain, orthopnea, claudication, leg swelling and PND.  Gastrointestinal:  Negative for blood in stool and melena.  Genitourinary:  Negative for hematuria.  Skin:  Negative for itching and rash.  Neurological:  Negative for dizziness and headaches.  Endo/Heme/Allergies:  Negative for polydipsia. Does not bruise/bleed easily.  Psychiatric/Behavioral:  Negative for memory loss. The patient does not have insomnia.     All other systems reviewed and are negative.  EKGs/Labs/Other Studies Reviewed:    The following studies were reviewed today:  MRA 04/08/22: IMPRESSION: Relatively unchanged size and configuration of the ascending aorta, estimated 43 cm on the current study. Recommend annual imaging followup by CTA or MRA. This recommendation follows 2010 ACCF/AHA/AATS/ACR/ASA/SCA/SCAI/SIR/STS/SVM Guidelines for the Diagnosis and Management of Patients with Thoracic Aortic Disease. Circulation. 2010; 121: L572-I203. Aortic aneurysm NOS (ICD10-I71.9)  MRI 04/07/2021: IMPRESSION: Relatively unchanged size and configuration of the thoracic aorta, with the maximum estimated diameter of the ascending aorta 43 mm on the current MR study. Recommend annual imaging followup by CTA or MRA. This recommendation follows 2010 ACCF/AHA/AATS/ACR/ASA/SCA/SCAI/SIR/STS/SVM Guidelines for the Diagnosis and Management of Patients with Thoracic Aortic Disease. Circulation. 2010; 121: T597-C163. Aortic aneurysm NOS (ICD10-I71.9) Incidental imaging of exophytic rounded lesion of the left kidney. This may represent a cyst that was identified on prior ultrasound of 2014, however, the current study is nondiagnostic. Nonemergent abdominal ultrasound of the abdomen recommended given the patient's history of right-sided nephrectomy for RCC.  CT  Cardiac Morph 06/15/20 1. There is normal pulmonary vein drainage into the left atrium. 2. The left atrial appendage is large with chicken wing morphology, 1 major lobe and ostial size 37 x 24 mm and length 56 mm. There is no thrombus in the left atrial appendage. 3. The esophagus runs to the left from the left atrial midline and is in the proximity to the ostia of the LUPV and LLPV. 4. Mild ascending aortic aneurysm with maximum diameter 41 mm.  No dissection, mild diffuse atherosclerotic plaque and calcifications. 5. 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  Echo TEE 10/11/18 1. The left ventricle has low normal systolic function, with an ejection fraction of 50-55%. The cavity size was normal. No evidence of left ventricular regional wall motion abnormalities.   2. The right ventricle has normal systolc function. The cavity was normal. There is no increase in right ventricular wall thickness.   3. Left atrial size was mildly dilated.   4. Right atrial size was mildly dilated.   5. No evidence of mitral valve stenosis. No mitral valve vegetation visualized.   6. The aortic valve is tricuspid.   7. There is evidence of mild plaque in the descending aorta.   EKG:  EKG is personally reviewed. 05/17/2022: EKG was not ordered.  02/04/2022: NSR. Rate 60 bpm.  06/22/21: EKG was not ordered.  Recent Labs: 03/29/2022: ALT 16; BUN 25; Creatinine, Ser 1.18; Hemoglobin 15.1; Platelets 212.0; Potassium 3.9; Sodium 133; TSH 1.03  Recent Lipid Panel    Component Value Date/Time   CHOL 104 03/29/2022 1333   CHOL 108 04/07/2020 1419   CHOL 206 (H) 12/21/2015 1522   TRIG 82.0 03/29/2022 1333   TRIG 161 (H) 12/21/2015 1522   HDL 47.30 03/29/2022 1333   HDL 48 04/07/2020 1419   HDL 45 12/21/2015 1522   CHOLHDL 2 03/29/2022 1333   VLDL 16.4 03/29/2022 1333   LDLCALC 40 03/29/2022 1333   LDLCALC 41 04/07/2020 1419   LDLCALC 129 12/21/2015 1522     Risk Assessment/Calculations:    CHA2DS2-VASc  Score = 3   This indicates a 3.2% annual risk of stroke. The patient's score is based upon: CHF History: 0 HTN History: 1 Diabetes History: 0 Stroke History: 0 Vascular Disease History: 0 Age Score: 2 Gender Score: 0          Physical Exam:    VS:  BP 114/70   Pulse 66   Ht 6' (1.829 m)   Wt 210 lb 9.6 oz (95.5 kg)   SpO2 97%   BMI 28.56 kg/m     Wt Readings from Last 3 Encounters:  05/17/22 210 lb 9.6 oz (95.5 kg)  03/29/22 211 lb 12.8 oz (96.1 kg)  02/04/22 211 lb 12.8 oz (96.1 kg)     GEN:  Comfortable, NAD HEENT: Normal NECK: No JVD; No carotid bruits CARDIAC: RRR, no murmurs, rubs, gallops RESPIRATORY:  Clear to auscultation without rales, wheezing or rhonchi  ABDOMEN: Soft, non-tender, non-distended MUSCULOSKELETAL:  No edema; No deformity  SKIN: Warm and dry NEUROLOGIC:  Alert and oriented x 3 PSYCHIATRIC:  Normal affect   ASSESSMENT:    1. Persistent atrial fibrillation ([place])   2. Aneurysm of ascending aorta without rupture ([place])   3. Mixed hyperlipidemia   4. Essential (primary) hypertension   5. Acquired thrombophilia ([place])   6. Secondary hypercoagulable state ([place])     PLAN:    In order of problems listed above:  #Paroxysmal Afib/Flutter: CHADs-vasc 3. S/p ablation 09/2018 with <1% burden of Afib on ILR. On flec '100mg'$  BID and apixaban. Now following with Dr. Lovena Le -Follow-up with EP as scheduled -Continue flec '100mg'$  BID -Continue eliquis for [facility]  #HTN: Controlled and at goal <130/90. -Continue coreg 6.'25mg'$  BID -Continue HCTZ '25mg'$  daily  #HLD: -Continue repatha '140mg'$  q 2weeks -LDL at goal of 40  #AAA: Not on ASA due to need for [facility]. On repatha. MRI on 04/07/2022 shows max diameter 55m which is overall stable  from prior.  Will need annual monitoring. -Continue annual imaging with MRA/CTA -Continue repatha  -Not on ASA due to need for [facility]  #Exophytic rounded lesion of left kidney: Noted incidentally on MRI. Follow-up ultrasound with  no hepatic mass or acute process. -Deemed benign with no further work-up needed at this time  Follow up in 6 months.  Medication Adjustments/Labs and Tests Ordered: Current medicines are reviewed at length with the patient today.  Concerns regarding medicines are outlined above.  No orders of the defined types were placed in this encounter.  No orders of the defined types were placed in this encounter.   Patient Instructions  Medication Instructions:  Your physician recommends that you continue on your current medications as directed. Please refer to the Current Medication list given to you today.  *If you need a refill on your cardiac medications before your next appointment, please call your pharmacy*   Lab Work: None ordered.  If you have labs (blood work) drawn today and your tests are completely normal, you will receive your results only by: [place] (if you have MyChart) OR A paper copy in the mail If you have any lab test that is abnormal or we need to change your treatment, we will call you to review the results.   Testing/Procedures: None ordered.    Follow-Up: At [facility], you and your health needs are our priority.  As part of our continuing mission to provide you with exceptional heart care, we have created designated Provider Care Teams.  These Care Teams include your primary Cardiologist (physician) and Advanced Practice Providers (APPs -  Physician Assistants and Nurse Practitioners) who all work together to provide you with the care you need, when you need it.  We recommend signing up for the patient portal called "MyChart".  Sign up information is provided on this After Visit Summary.  MyChart is used to connect with patients for Virtual Visits (Telemedicine).  Patients are able to view lab/test results, encounter notes, upcoming appointments, etc.  Non-urgent messages can be sent to your provider as well.   To learn more about what you can do  with MyChart, go to [email or web address].    Your next appointment:   6 months with Dr Johney Frame  Important Information About Sugar          I,Mykaella Javier,acting as a scribe for Freada Bergeron, MD.,have documented all relevant documentation on the behalf of Freada Bergeron, MD,as directed by  Freada Bergeron, MD while in the presence of Freada Bergeron, MD.  I, Freada Bergeron, MD, have reviewed all documentation for this visit. The documentation on 05/17/22 for the exam, diagnosis, procedures, and orders are all accurate and complete.    I,Rachel Rivera,acting as a scribe for Freada Bergeron, MD.,have documented all relevant documentation on the behalf of Freada Bergeron, MD,as directed by  Freada Bergeron, MD while in the presence of Freada Bergeron, MD.   I, Freada Bergeron, MD, have reviewed all documentation for this visit. The documentation on 05/17/22 for the exam, diagnosis, procedures, and orders are all accurate and complete.   Signed, Freada Bergeron, MD  05/17/2022 9:27 AM    [place]

## 2022-05-18 ENCOUNTER — Telehealth: Payer: Self-pay | Admitting: *Deleted

## 2022-05-18 NOTE — Patient Outreach (Signed)
  Care Coordination   05/18/2022 Name: Timothy Dickson MRN: [id number] DOB: 1945/09/17   Care Coordination Outreach Attempts:  An unsuccessful telephone outreach was attempted today to offer the patient information about available care coordination services as a benefit of their health plan.   Follow Up Plan:  Additional outreach attempts will be made to offer the patient care coordination information and services.   Encounter Outcome:  No Answer  Care Coordination Interventions Activated:  No   Care Coordination Interventions:  No, not indicated    Raina Mina, RN Care Management Coordinator [place] Office [phone]

## 2022-05-19 ENCOUNTER — Encounter: Payer: Self-pay | Admitting: Family Medicine

## 2022-05-24 ENCOUNTER — Telehealth: Payer: Self-pay | Admitting: *Deleted

## 2022-05-24 NOTE — Patient Outreach (Signed)
  Care Coordination   05/24/2022 Name: Timothy Dickson MRN: [id number] DOB: [birth year]   Care Coordination Outreach Attempts:  A second unsuccessful outreach was attempted today to offer the patient with information about available care coordination services as a benefit of their health plan.     Follow Up Plan:  Additional outreach attempts will be made to offer the patient care coordination information and services.   Encounter Outcome:  No Answer  Care Coordination Interventions Activated:  No   Care Coordination Interventions:  No, not indicated    Raina Mina, RN Care Management Coordinator [place] Office [phone]

## 2022-05-30 ENCOUNTER — Ambulatory Visit (INDEPENDENT_AMBULATORY_CARE_PROVIDER_SITE_OTHER): Payer: Medicare Other | Admitting: Family Medicine

## 2022-05-30 ENCOUNTER — Encounter: Payer: Self-pay | Admitting: Family Medicine

## 2022-05-30 VITALS — BP 109/71 | HR 74 | Temp 98.2°F | Ht 72.0 in | Wt 209.0 lb

## 2022-05-30 DIAGNOSIS — H6122 Impacted cerumen, left ear: Secondary | ICD-10-CM

## 2022-05-30 DIAGNOSIS — E1159 Type 2 diabetes mellitus with other circulatory complications: Secondary | ICD-10-CM | POA: Diagnosis not present

## 2022-05-30 DIAGNOSIS — I4819 Other persistent atrial fibrillation: Secondary | ICD-10-CM | POA: Diagnosis not present

## 2022-05-30 DIAGNOSIS — I152 Hypertension secondary to endocrine disorders: Secondary | ICD-10-CM | POA: Diagnosis not present

## 2022-05-30 NOTE — Progress Notes (Signed)
   Timothy Dickson is a 76 y.o. male who presents today for an office visit.  Assessment/Plan:  New/Acute Problems: Cerumen impaction Successfully irrigated by CMA today.  He tolerated well without any complications.  He can use Debrox or hydrogen peroxide in the future to prevent wax buildup.  Chronic Problems Addressed Today: Hypertension associated with diabetes ([place]) Blood pressure at goal on Coreg 6.25 mg twice daily and HCTZ 25 mg daily.  Persistent atrial fibrillation ([place]) Follows with cardiology.  Recently had a brief episode of A-fib.  He is rate controlled on Coreg and anticoagulated on Eliquis.     Subjective:  HPI:  Patient here with concern for decreased hearing. This started a couple of days ago. Comes and goes. Mostly in left ear but then progressed to right ear.  No treatments tried.  No injuries.  See A/P for status of chronic conditions.       Objective:  Physical Exam: BP 109/71 (BP Location: Left Arm, Patient Position: Sitting)   Pulse 74   Temp 98.2 F (36.8 C) (Temporal)   Ht 6' (1.829 m)   Wt 209 lb (94.8 kg)   SpO2 95%   BMI 28.35 kg/m   Gen: No acute distress, resting comfortably HEENT: Bilateral EAC obscured by cerumen.  After successful irrigation by CMA TMs were visualized without any abnormalities. CV: Regular rate and rhythm with no murmurs appreciated Pulm: Normal work of breathing, clear to auscultation bilaterally with no crackles, wheezes, or rhonchi Neuro: Grossly normal, moves all extremities Psych: Normal affect and thought content      Enora Trillo M. Jerline Pain, MD 05/30/2022 12:16 PM

## 2022-05-30 NOTE — Assessment & Plan Note (Signed)
Follows with cardiology.  Recently had a brief episode of A-fib.  He is rate controlled on Coreg and anticoagulated on Eliquis.

## 2022-05-30 NOTE — Patient Instructions (Addendum)
It was very nice to see you today!  We cleaned out the earwax in your ear today.  You can use a small amount of hydrogen peroxide or Debrox once or twice weekly to prevent wax buildup.  We will see you back in February.  Please come back to see [place] sooner if needed.  Take care, Dr Jerline Pain  PLEASE NOTE:  If you had any lab tests please let us know if you have not heard back within a few days. You may see your results on mychart before we have a chance to review them but we will give you a call once they are reviewed by [place]. If we ordered any referrals today, please let us know if you have not heard from their office within the next week.   Please try these tips to maintain a healthy lifestyle:  Eat at least 3 REAL meals and 1-2 snacks per day.  Aim for no more than 5 hours between eating.  If you eat breakfast, please do so within one hour of getting up.   Each meal should contain half fruits/vegetables, one quarter protein, and one quarter carbs (no bigger than a computer mouse)  Cut down on sweet beverages. This includes juice, soda, and sweet tea.   Drink at least 1 glass of water with each meal and aim for at least 8 glasses per day  Exercise at least 150 minutes every week.

## 2022-05-30 NOTE — Assessment & Plan Note (Signed)
Blood pressure at goal on Coreg 6.25 mg twice daily and HCTZ 25 mg daily. 

## 2022-06-02 NOTE — Progress Notes (Signed)
Carelink Summary Report / Loop Recorder 

## 2022-06-09 ENCOUNTER — Encounter: Payer: Self-pay | Admitting: Cardiology

## 2022-06-10 ENCOUNTER — Encounter: Payer: Self-pay | Admitting: Cardiology

## 2022-06-11 ENCOUNTER — Encounter: Payer: Self-pay | Admitting: Cardiology

## 2022-06-17 ENCOUNTER — Ambulatory Visit: Payer: Medicare Other | Admitting: Cardiology

## 2022-06-20 ENCOUNTER — Ambulatory Visit (INDEPENDENT_AMBULATORY_CARE_PROVIDER_SITE_OTHER): Payer: Medicare Other

## 2022-06-20 DIAGNOSIS — I4819 Other persistent atrial fibrillation: Secondary | ICD-10-CM | POA: Diagnosis not present

## 2022-06-21 LAB — CUP PACEART REMOTE DEVICE CHECK
Date Time Interrogation Session: 20231119230443
Implantable Pulse Generator Implant Date: 20210819

## 2022-07-03 ENCOUNTER — Encounter: Payer: Self-pay | Admitting: Internal Medicine

## 2022-07-08 ENCOUNTER — Ambulatory Visit: Payer: Medicare Other | Admitting: Gastroenterology

## 2022-07-08 ENCOUNTER — Telehealth: Payer: Self-pay

## 2022-07-08 ENCOUNTER — Encounter: Payer: Self-pay | Admitting: Gastroenterology

## 2022-07-08 VITALS — BP 102/67 | HR 64 | Ht 72.0 in | Wt 207.0 lb

## 2022-07-08 DIAGNOSIS — Z8619 Personal history of other infectious and parasitic diseases: Secondary | ICD-10-CM | POA: Diagnosis not present

## 2022-07-08 DIAGNOSIS — Z7901 Long term (current) use of anticoagulants: Secondary | ICD-10-CM

## 2022-07-08 DIAGNOSIS — Z1211 Encounter for screening for malignant neoplasm of colon: Secondary | ICD-10-CM

## 2022-07-08 DIAGNOSIS — K7460 Unspecified cirrhosis of liver: Secondary | ICD-10-CM | POA: Diagnosis not present

## 2022-07-08 MED ORDER — PLENVU 140 G PO SOLR: 1.0000 | Freq: Once | ORAL | 0 refills | Status: AC

## 2022-07-08 NOTE — Telephone Encounter (Signed)
[facility] Pre-operative Risk Assessment     Request for surgical clearance:     Endoscopy Procedure  What type of surgery is being performed?     COLONOSCOPY   When is this surgery scheduled?     09-09-22  What type of clearance is required ?   Pharmacy  Are there any medications that need to be held prior to surgery and how long? ELIQUIS 2 DAYS  Practice name and name of physician performing surgery?   DR [facility]  What is your office phone and fax number?      Phone- [phone]  Fax- [phone] ATTN: Tia Alert, CMA  Anesthesia type (None, local, MAC, general) ?       MAC   THANK YOU

## 2022-07-08 NOTE — Telephone Encounter (Signed)
Patient with diagnosis of afib on Eliquis for anticoagulation.    Procedure: colonoscopy Date of procedure: 09/09/22  CHA2DS2-VASc Score = 5  This indicates a 7.2% annual risk of stroke. The patient's score is based upon: CHF History: 0 HTN History: 1 Diabetes History: 1 Stroke History: 0 Vascular Disease History: 1 Age Score: 2 Gender Score: 0   CrCl 45m/min Platelet count 212K  Per office protocol, patient can hold Eliquis for 2 days prior to procedure as requested.    **This guidance is not considered finalized until pre-operative APP has relayed final recommendations.**

## 2022-07-08 NOTE — Patient Instructions (Signed)
If you are age 76 or older, your body mass index should be between 23-30. Your Body mass index is 28.07 kg/m. If this is out of the aforementioned range listed, please consider follow up with your Primary Care Provider.  If you are age 62 or younger, your body mass index should be between 19-25. Your Body mass index is 28.07 kg/m. If this is out of the aformentioned range listed, please consider follow up with your Primary Care Provider.   ________________________________________________________  Timothy Dickson have been scheduled for a colonoscopy. Please follow written instructions given to you at your visit today.  Please pick up your prep supplies at the pharmacy within the next 1-3 days. If you use inhalers (even only as needed), please bring them with you on the day of your procedure.  You will be contacted by our office prior to your procedure for directions on holding your ELIQUIS.  If you do not hear from our office 1 week prior to your scheduled procedure, please call [phone] to discuss.    You will be due for labs in February.  We will remind you when it is time to go.  You will be contacted by [facility] in the next 2 days to arrange a Liver Ultrasound.  The number on your caller ID will be [phone], please answer when they call.  If you have not heard from them in 2 days please call [phone] to schedule.    You have been scheduled for an abdominal ultrasound at [facility] (1st floor of hospital) on _______________ at _________________. Please arrive 30 minutes prior to your appointment for registration. Make certain not to have anything to eat or drink 6 hours prior to your appointment. Should you need to reschedule your appointment, please contact radiology at [phone]. This test typically takes about 30 minutes to perform.   Thank you for entrusting me with your care and for choosing [facility], Dr. Bonneauville Cellar

## 2022-07-08 NOTE — Progress Notes (Signed)
HPI :  77 year old male with a history of hepatitis C associated cirrhosis, history of renal neoplasm, GERD, here for a follow up visit for hep C related cirrhosis.  He also has atrial fibrillation on Eliquis.  See last intake visit for full details of his history.  Hep C eradicated back in 2015 at [facility], he followed up with them for several years and transitioned his care to [place] in recent years.  He has done really well following eradication of hep C.  His cirrhosis is compensated.  Interestingly, he has multiple ultrasounds at [facility] in 2022 showing cirrhosis of the liver.  He had an elastography done this past February which showed low risk fibrotic change.  He has not had any decompensating events.  He takes Coreg for his cardiovascular issues so we have not performed variceal screening, he also has had normal platelets and suspicion for significant portal hypertension has been low.  He had a right upper quadrant ultrasound this past June for [facility] screening which was normal.  He has been doing well from a cardiovascular perspective, generally denies any cardiopulmonary complaints.  Recall he has a history of reflux, has been on omeprazole in the past.  Currently takes it every other day which works pretty well to control symptoms.  Recall his last EGD showed no Barrett's esophagus.  He denies any dysphagia at this time.  He is moving his bowels regularly, no blood in his stools, no family history of colon cancer.  His last colonoscopy was in April 2014 with Dr. Earlean Shawl which was normal without polyps, remarkable for diverticulosis..    Prior workup: EGD 10/2012: Dr. Earlean Shawl - normal   Colonoscopy 10/2012: - Dr. Earlean Shawl - diverticulosis, otherwise normal    Echo 10/11/18 - EF 50-55%   Exercise stress 07/08/21 - [facility]  RUQ [place] 01/12/22: IMPRESSION: No hepatic mass or acute process identified.  Past Medical History:  Diagnosis Date   Atrial fibrillation ([place]) 2019   Atrial tachycardia     documented by holter 2016. Followed by Dr. Liane Comber and Dr. Rayann Heman.   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 no further tx other than surgery [facility].   Cirrhosis ([place])    [facility] dx, early stage- follow up visits at [facility].   CRI (chronic renal insufficiency)    s/p R nephrectomy in 1999 for malignancy   Diabetes mellitus without complication (HCC)    NIDDM- dx. 3-4 years ago diet control   Diverticulosis of colon    mild   GERD (gastroesophageal reflux disease)    Headache(784.0)    Hepatitis C    s/p therapy [facility] (curative)/with Harvoni   History of BPH    no recent issues   History of substance abuse ([place])    past history -none in 38 yrs   Hyperglycemia    Hyperlipemia    Hypertension    Osteoarthritis, knee    knees bilaterally    Pneumonia 1975   hx of 35 years ago     Past Surgical History:  Procedure Laterality Date   Arthroscopy right knee     may '11 (Dr Alvan Dame)   Arthroscopy, left knee,     ATRIAL FIBRILLATION ABLATION N/A 10/11/2018   Procedure: ATRIAL FIBRILLATION ABLATION;  Surgeon: Thompson Grayer, MD;  Location: [place] CV LAB;  Service: Cardiovascular;  Laterality: N/A;   ATRIAL FIBRILLATION ABLATION N/A 06/19/2020   Procedure: ATRIAL FIBRILLATION ABLATION;  Surgeon: Thompson Grayer, MD;  Location: [place] CV LAB;  Service: Cardiovascular;  Laterality: N/A;   CARDIAC ELECTROPHYSIOLOGY MAPPING AND ABLATION     history of liver biopsy  2007   History of Nephrectomy Right 1999   implantable loop recorder placement  03/19/2020    Medtronic Reveal Linq model LNQ 22 (RLB [id number] ) implantable loop recorder implanted by Dr Rayann Heman for evaluation of palpitations and afib management post ablation   KNEE ARTHROSCOPY Left 10/10/2012   Procedure: LEFT KNEE ARTHROSCOPY WITH MENSCIAL DEBRIDEMENT AND CHONDROPLASTY;  Surgeon: Gearlean Alf, MD;  [facility];  Service: Orthopedics;  Laterality: Left;   left shoulder repair for bone spur  1990's   TEE WITHOUT  CARDIOVERSION N/A 10/11/2018   Procedure: TRANSESOPHAGEAL ECHOCARDIOGRAM (TEE);  Surgeon: Buford Dresser, MD;  Location: [facility] ENDOSCOPY;  Service: Cardiovascular;  Laterality: N/A;  ablation to follow at [place] Right 08/15/2016   Procedure: RIGHT TOTAL KNEE ARTHROPLASTY, CORTISONE INJECTION OF LEFT KNEE;  Surgeon: Gaynelle Arabian, MD;  [facility];  Service: Orthopedics;  Laterality: Right;   TOTAL KNEE ARTHROPLASTY Left 09/04/2017   Procedure: LEFT TOTAL KNEE ARTHROPLASTY;  Surgeon: Gaynelle Arabian, MD;  [facility];  Service: Orthopedics;  Laterality: Left;   Transurethral needle ablation procedure     TUNA procedure     Family History  Problem Relation Age of Onset   Coronary artery disease Mother    CVA Mother    Alcohol abuse Father        variceal hemorrhage   Liver disease Father        alcohol related   Alcohol abuse Sister    Diabetes Sister    Breast cancer Sister    Lung cancer Sister    Esophageal cancer Sister    Cancer Brother        head and neck   Alcohol abuse Other        Strong family Hx   Colon cancer Neg Hx    Pancreatic cancer Neg Hx    Social History   Tobacco Use   Smoking status: Former    Packs/day: 1.00    Years: 15.00    Total pack years: 15.00    Types: Cigarettes    Quit date: 08/01/1977    Years since quitting: 44.9   Smokeless tobacco: Never  Vaping Use   Vaping Use: Never used  Substance Use Topics   Alcohol use: No    Comment: past history -none in 89 yrs   Drug use: No    Comment: past hx none in 38 yrs   Current Outpatient Medications  Medication Sig Dispense Refill   acetaminophen (TYLENOL) 650 MG CR tablet Take 650 mg by mouth See admin instructions. Take 650 mg at 11 am and take 650 mg at 4-5 am (take with gabapentin)     allopurinol (ZYLOPRIM) 100 MG tablet TAKE ONE TABLET BY MOUTH DAILY 90 tablet 3   Aloe-Sodium Chloride (AYR SALINE NASAL GEL NA) Place 1 application into the nose at bedtime.      apixaban (ELIQUIS) 5 MG TABS tablet Take 1 tablet (5 mg total) by mouth 2 (two) times daily. 60 tablet 11   b complex vitamins capsule Take 1 capsule by mouth daily.     carvedilol (COREG) 6.25 MG tablet Take 1 tablet (6.25 mg total) by mouth 2 (two) times daily. 180 tablet 3   Evolocumab (REPATHA SURECLICK) 876 MG/ML SOAJ INJECT CONTENTS OF 1 PEN SUBCUTANEOUSLY EVERY 14 DAYS. 6 mL 3   famotidine (  PEPCID) 20 MG tablet Take 20 mg by mouth daily as needed for heartburn.      flecainide (TAMBOCOR) 100 MG tablet Take 1 tablet (100 mg total) by mouth 2 (two) times daily. 180 tablet 3   gabapentin (NEURONTIN) 800 MG tablet TAKE ONE TABLET BY MOUTH TWICE DAILY 180 tablet 1   hydrochlorothiazide (HYDRODIURIL) 25 MG tablet Take 1 tablet (25 mg total) by mouth daily. 90 tablet 3   omeprazole (PRILOSEC) 20 MG capsule Take 20 mg by mouth every other day.      sodium chloride (OCEAN) 0.65 % SOLN nasal spray Place 1 spray into both nostrils daily.     fluticasone (FLONASE) 50 MCG/ACT nasal spray Place 1 spray into both nostrils daily. (Patient not taking: Reported on 07/08/2022) 16 g 0   No current facility-administered medications for this visit.   Allergies  Allergen Reactions   Tramadol Hcl     Other reaction(s): ineffective   Acetaminophen Other (See Comments)    Pt states he was told by [facility]'s to keep tylenol dosage to 2000 mg per 24 hours due to cirrhosis of liver.   Nsaids Other (See Comments)    Avoid due to having 1 kidney    Pravastatin Other (See Comments)    Pt reports this med makes him generally feel unwell.     Review of Systems: All systems reviewed and negative except where noted in HPI.   Lab Results  Component Value Date   WBC 7.9 03/29/2022   HGB 15.1 03/29/2022   HCT 44.3 03/29/2022   MCV 92.4 03/29/2022   PLT 212.0 03/29/2022    Lab Results  Component Value Date   CREATININE 1.18 03/29/2022   BUN 25 (H) 03/29/2022   NA 133 (L) 03/29/2022   K 3.9 03/29/2022    CL 95 (L) 03/29/2022   CO2 25 03/29/2022    Lab Results  Component Value Date   ALT 16 03/29/2022   AST 22 03/29/2022   ALKPHOS 59 03/29/2022   BILITOT 0.8 03/29/2022    Lab Results  Component Value Date   INR 1.03 03/18/2018   INR 1.06 08/23/2017   INR 1.00 08/04/2016     Physical Exam: BP 102/67   Pulse 64   Ht 6' (1.829 m)   Wt 207 lb (93.9 kg)   BMI 28.07 kg/m  Constitutional: Pleasant,well-developed, male in no acute distress. Neurological: Alert and oriented to person place and time. Psychiatric: Normal mood and affect. Behavior is normal.   ASSESSMENT: 76 y.o. male here for assessment of the following  1. Cirrhosis of liver without ascites, unspecified hepatic cirrhosis type ([place])   2. History of hepatitis C   3. Colon cancer screening   4. Anticoagulated    Hep C is eradicated, his liver disease is well compensated. Interestingly numerous prior US at [facility] shows cirrhosis but fibroscan earlier this year looked okay.  Regardless with his history of cirrhosis/fibrosis, he wants to continue [facility] screening, is due for right upper quadrant ultrasound.  He is due for liver labs this February.  He understands he is at risk for decompensation over time however hopefully that risk is minimal with hep C being eradicated.  Given he is on Coreg he does not need an endoscopy.  He is otherwise due for screening colonoscopy.  We discussed at his age if he wanted to pursue any further screening exams.  We discussed that most patients stop screening at age 50 if they have no significant  history of polyps or family history.  He understands this however is not comfortable stopping screening and would like 1 more exam prior to stopping.  I offered this to him given he is otherwise doing very well in regards to his comorbidities.  I discussed risk benefits of anesthesia and colonoscopy with him and he wants to proceed.  He will need approval from his cardiologist to hold Eliquis for 2 days  prior to the exam.  PLAN: - schedule RUQ [place] for [facility] screening this month - due for follow up labs in February - on Coreg, does not need EGD - schedule for colonoscopy at the [facility] in February following good discussion as above, he wishes to do one more exam prior to stopping, assuming no high risk lesions - will ask for approval to hold Eliquis for 2 days prior to the exam.  Jolly Mango, MD [facility] Gastroenterology

## 2022-07-08 NOTE — Telephone Encounter (Signed)
   Patient Name: Timothy Dickson  DOB: 1945/09/21 MRN: [id number]  Primary Cardiologist: Ena Dawley, MD  Clinical pharmacists have reviewed the patient's past medical history, labs, and current medications as part of preoperative protocol coverage. The following recommendations have been made:  Patient with diagnosis of afib on Eliquis for anticoagulation.     Procedure: colonoscopy Date of procedure: 09/09/22   CHA2DS2-VASc Score = 5  This indicates a 7.2% annual risk of stroke. The patient's score is based upon: CHF History: 0 HTN History: 1 Diabetes History: 1 Stroke History: 0 Vascular Disease History: 1 Age Score: 2 Gender Score: 0   CrCl 63m/min Platelet count 212K   Per office protocol, patient can hold Eliquis for 2 days prior to procedure as requested.  Please resume Eliquis as soon as possible postprocedure, at the discretion of the surgeon.    I will route this recommendation to the requesting party via Epic fax function and remove from pre-op pool.  Please call with questions.  ELenna Sciara NP 07/08/2022, 2:32 PM

## 2022-07-13 DIAGNOSIS — D485 Neoplasm of uncertain behavior of skin: Secondary | ICD-10-CM | POA: Diagnosis not present

## 2022-07-13 DIAGNOSIS — D0462 Carcinoma in situ of skin of left upper limb, including shoulder: Secondary | ICD-10-CM | POA: Diagnosis not present

## 2022-07-13 DIAGNOSIS — Z85828 Personal history of other malignant neoplasm of skin: Secondary | ICD-10-CM | POA: Diagnosis not present

## 2022-07-13 DIAGNOSIS — L570 Actinic keratosis: Secondary | ICD-10-CM | POA: Diagnosis not present

## 2022-07-13 DIAGNOSIS — L821 Other seborrheic keratosis: Secondary | ICD-10-CM | POA: Diagnosis not present

## 2022-07-26 ENCOUNTER — Ambulatory Visit (INDEPENDENT_AMBULATORY_CARE_PROVIDER_SITE_OTHER): Payer: Medicare Other

## 2022-07-26 DIAGNOSIS — I4819 Other persistent atrial fibrillation: Secondary | ICD-10-CM | POA: Diagnosis not present

## 2022-07-26 LAB — CUP PACEART REMOTE DEVICE CHECK
Date Time Interrogation Session: 20231222230011
Implantable Pulse Generator Implant Date: 20210819

## 2022-08-02 ENCOUNTER — Encounter: Payer: Self-pay | Admitting: Gastroenterology

## 2022-08-02 NOTE — Progress Notes (Signed)
Carelink Summary Report / Loop Recorder 

## 2022-08-10 ENCOUNTER — Ambulatory Visit (HOSPITAL_COMMUNITY)
Admission: RE | Admit: 2022-08-10 | Discharge: 2022-08-10 | Disposition: A | Discharge status: Home / Self Care | Payer: Medicare Other | Source: Ambulatory Visit | Attending: Gastroenterology | Admitting: Gastroenterology

## 2022-08-10 DIAGNOSIS — Z8619 Personal history of other infectious and parasitic diseases: Secondary | ICD-10-CM | POA: Diagnosis not present

## 2022-08-10 DIAGNOSIS — K7460 Unspecified cirrhosis of liver: Secondary | ICD-10-CM | POA: Diagnosis not present

## 2022-08-10 DIAGNOSIS — Z7901 Long term (current) use of anticoagulants: Secondary | ICD-10-CM | POA: Diagnosis not present

## 2022-08-10 DIAGNOSIS — Z1211 Encounter for screening for malignant neoplasm of colon: Secondary | ICD-10-CM | POA: Diagnosis not present

## 2022-08-11 ENCOUNTER — Encounter: Payer: Self-pay | Admitting: Gastroenterology

## 2022-08-13 ENCOUNTER — Other Ambulatory Visit: Payer: Self-pay | Admitting: Family Medicine

## 2022-08-18 NOTE — Progress Notes (Signed)
Carelink Summary Report / Loop Recorder

## 2022-08-22 NOTE — Telephone Encounter (Signed)
Called patient. He understands to hold Eliquis on the 7th and 8th of February for procedure on the 9th

## 2022-08-29 ENCOUNTER — Ambulatory Visit: Payer: Medicare Other

## 2022-08-29 ENCOUNTER — Telehealth: Payer: Self-pay

## 2022-08-29 DIAGNOSIS — K219 Gastro-esophageal reflux disease without esophagitis: Secondary | ICD-10-CM

## 2022-08-29 DIAGNOSIS — Z8619 Personal history of other infectious and parasitic diseases: Secondary | ICD-10-CM

## 2022-08-29 DIAGNOSIS — I4819 Other persistent atrial fibrillation: Secondary | ICD-10-CM

## 2022-08-29 DIAGNOSIS — K7460 Unspecified cirrhosis of liver: Secondary | ICD-10-CM

## 2022-08-29 NOTE — Telephone Encounter (Signed)
Patient due for labs.

## 2022-08-29 NOTE — Telephone Encounter (Signed)
-----  Message from Roetta Sessions, [place] sent at 03/03/2022  2:55 PM EDT ----- Regarding: labs due in Feb CBC, CMet, AFP, INR in early Feb (6 months)

## 2022-08-30 LAB — CUP PACEART REMOTE DEVICE CHECK
Date Time Interrogation Session: 20240128230556
Implantable Pulse Generator Implant Date: 20210819

## 2022-08-31 ENCOUNTER — Other Ambulatory Visit (INDEPENDENT_AMBULATORY_CARE_PROVIDER_SITE_OTHER): Payer: Medicare Other

## 2022-08-31 DIAGNOSIS — K219 Gastro-esophageal reflux disease without esophagitis: Secondary | ICD-10-CM

## 2022-08-31 DIAGNOSIS — Z8619 Personal history of other infectious and parasitic diseases: Secondary | ICD-10-CM

## 2022-08-31 DIAGNOSIS — K7460 Unspecified cirrhosis of liver: Secondary | ICD-10-CM | POA: Diagnosis not present

## 2022-08-31 LAB — COMPREHENSIVE METABOLIC PANEL
ALT: 14 U/L (ref 0–53)
AST: 22 U/L (ref 0–37)
Albumin: 4.7 g/dL (ref 3.5–5.2)
Alkaline Phosphatase: 55 U/L (ref 39–117)
BUN: 19 mg/dL (ref 6–23)
CO2: 31 mEq/L (ref 19–32)
Calcium: 10 mg/dL (ref 8.4–10.5)
Chloride: 95 mEq/L — ABNORMAL LOW (ref 96–112)
Creatinine, Ser: 1.24 mg/dL (ref 0.40–1.50)
GFR: 56.32 mL/min — ABNORMAL LOW (ref >60.00)
Glucose, Bld: 168 mg/dL — ABNORMAL HIGH (ref 70–99)
Potassium: 3.9 mEq/L (ref 3.5–5.1)
Sodium: 134 mEq/L — ABNORMAL LOW (ref 135–145)
Total Bilirubin: 0.8 mg/dL (ref 0.2–1.2)
Total Protein: 7.1 g/dL (ref 6.0–8.3)

## 2022-08-31 LAB — CBC WITH DIFFERENTIAL/PLATELET
Basophils Absolute: 0 10*3/uL (ref 0.0–0.1)
Basophils Relative: 0.7 % (ref 0.0–3.0)
Eosinophils Absolute: 0.2 10*3/uL (ref 0.0–0.7)
Eosinophils Relative: 2.4 % (ref 0.0–5.0)
HCT: 44.6 % (ref 39.0–52.0)
Hemoglobin: 15.4 g/dL (ref 13.0–17.0)
Lymphocytes Relative: 19.5 % (ref 12.0–46.0)
Lymphs Abs: 1.4 10*3/uL (ref 0.7–4.0)
MCHC: 34.5 g/dL (ref 30.0–36.0)
MCV: 94 fl (ref 78.0–100.0)
Monocytes Absolute: 0.5 10*3/uL (ref 0.1–1.0)
Monocytes Relative: 6.9 % (ref 3.0–12.0)
Neutro Abs: 5 10*3/uL (ref 1.4–7.7)
Neutrophils Relative %: 70.5 % (ref 43.0–77.0)
Platelets: 211 10*3/uL (ref 150.0–400.0)
RBC: 4.75 Mil/uL (ref 4.22–5.81)
RDW: 12.9 % (ref 11.5–15.5)
WBC: 7.1 10*3/uL (ref 4.0–10.5)

## 2022-08-31 LAB — PROTIME-INR
INR: 1.3 ratio — ABNORMAL HIGH (ref 0.8–1.0)
Prothrombin Time: 14 s — ABNORMAL HIGH (ref 9.6–13.1)

## 2022-09-02 ENCOUNTER — Encounter: Payer: Self-pay | Admitting: Gastroenterology

## 2022-09-02 LAB — AFP TUMOR MARKER: AFP-Tumor Marker: 2.7 ng/mL (ref <6.1)

## 2022-09-04 ENCOUNTER — Encounter: Payer: Self-pay | Admitting: Certified Registered Nurse Anesthetist

## 2022-09-08 ENCOUNTER — Encounter: Payer: Self-pay | Admitting: Internal Medicine

## 2022-09-09 ENCOUNTER — Encounter: Payer: Self-pay | Admitting: Gastroenterology

## 2022-09-09 ENCOUNTER — Ambulatory Visit (AMBULATORY_SURGERY_CENTER): Payer: Medicare Other | Admitting: Gastroenterology

## 2022-09-09 VITALS — BP 111/60 | HR 59 | Temp 97.5°F | Resp 16 | Ht 72.0 in | Wt 207.0 lb

## 2022-09-09 DIAGNOSIS — K635 Polyp of colon: Secondary | ICD-10-CM | POA: Diagnosis not present

## 2022-09-09 DIAGNOSIS — D123 Benign neoplasm of transverse colon: Secondary | ICD-10-CM | POA: Diagnosis not present

## 2022-09-09 DIAGNOSIS — Z1211 Encounter for screening for malignant neoplasm of colon: Secondary | ICD-10-CM

## 2022-09-09 DIAGNOSIS — D122 Benign neoplasm of ascending colon: Secondary | ICD-10-CM

## 2022-09-09 MED ORDER — SODIUM CHLORIDE 0.9 % IV SOLN: 500.0000 mL | Freq: Once | INTRAVENOUS | Status: DC

## 2022-09-09 NOTE — Progress Notes (Signed)
[facility] History and Physical   Primary Care Physician:  [name], MD   Reason for Procedure:   Colon cancer screening  Plan:    colonoscopy     HPI: Timothy Dickson is a 77 y.o. male  here for colonoscopy screening - last exam 10 years ago. Patient denies any bowel symptoms at this time. No family history of colon cancer known. Otherwise feels well without any cardiopulmonary symptoms. Eliquis held for 2 days for this exam  I have discussed risks / benefits of anesthesia and endoscopic procedure with [name] and they wish to proceed with the exams as outlined today.    Past Medical History:  Diagnosis Date   Atrial fibrillation ([place]) 2019   Atrial tachycardia    documented by holter 2016. Followed by Dr. Liane Comber and Dr. Rayann Heman.   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 no further tx other than surgery [facility].   Cirrhosis ([place])    [facility] dx, early stage- follow up visits at [facility].   CRI (chronic renal insufficiency)    s/p R nephrectomy in 1999 for malignancy   Diabetes mellitus without complication (HCC)    NIDDM- dx. 3-4 years ago diet control   Diverticulosis of colon    mild   GERD (gastroesophageal reflux disease)    Headache(784.0)    Hepatitis C    s/p therapy [facility] (curative)/with Harvoni   History of BPH    no recent issues   History of substance abuse ([place])    past history -none in 38 yrs   Hyperglycemia    Hyperlipemia    Hypertension    Osteoarthritis, knee    knees bilaterally    Pneumonia 1975   hx of 35 years ago    Past Surgical History:  Procedure Laterality Date   Arthroscopy right knee     may '11 (Dr Alvan Dame)   Arthroscopy, left knee,     ATRIAL FIBRILLATION ABLATION N/A 10/11/2018   Procedure: ATRIAL FIBRILLATION ABLATION;  Surgeon: Thompson Grayer, MD;  Location: [place] CV LAB;  Service: Cardiovascular;  Laterality: N/A;   ATRIAL FIBRILLATION ABLATION N/A 06/19/2020   Procedure: ATRIAL FIBRILLATION  ABLATION;  Surgeon: Thompson Grayer, MD;  Location: [place] CV LAB;  Service: Cardiovascular;  Laterality: N/A;   CARDIAC ELECTROPHYSIOLOGY MAPPING AND ABLATION     history of liver biopsy  2007   History of Nephrectomy Right 1999   implantable loop recorder placement  03/19/2020    Medtronic Reveal Linq model LNQ 22 (RLB [id number] ) implantable loop recorder implanted by Dr Rayann Heman for evaluation of palpitations and afib management post ablation   KNEE ARTHROSCOPY Left 10/10/2012   Procedure: LEFT KNEE ARTHROSCOPY WITH MENSCIAL DEBRIDEMENT AND CHONDROPLASTY;  Surgeon: Gearlean Alf, MD;  [facility];  Service: Orthopedics;  Laterality: Left;   left shoulder repair for bone spur  1990's   TEE WITHOUT CARDIOVERSION N/A 10/11/2018   Procedure: TRANSESOPHAGEAL ECHOCARDIOGRAM (TEE);  Surgeon: Buford Dresser, MD;  Location: [facility] ENDOSCOPY;  Service: Cardiovascular;  Laterality: N/A;  ablation to follow at [place] Right 08/15/2016   Procedure: RIGHT TOTAL KNEE ARTHROPLASTY, CORTISONE INJECTION OF LEFT KNEE;  Surgeon: Gaynelle Arabian, MD;  [facility];  Service: Orthopedics;  Laterality: Right;   TOTAL KNEE ARTHROPLASTY Left 09/04/2017   Procedure: LEFT TOTAL KNEE ARTHROPLASTY;  Surgeon: Gaynelle Arabian, MD;  [facility];  Service: Orthopedics;  Laterality: Left;   Transurethral needle ablation  procedure     TUNA procedure      Prior to Admission medications   Medication Sig Start Date End Date Taking? Authorizing Provider  acetaminophen (TYLENOL) 650 MG CR tablet Take 650 mg by mouth See admin instructions. Take 650 mg at 11 am and take 650 mg at 4-5 am (take with gabapentin)   Yes [provider]  allopurinol (ZYLOPRIM) 100 MG tablet TAKE ONE TABLET BY MOUTH DAILY 10/25/21  Yes [name], MD  Aloe-Sodium Chloride (AYR SALINE NASAL GEL NA) Place 1 application into the nose at bedtime.   Yes [provider]  b complex vitamins capsule Take  1 capsule by mouth daily.   Yes [provider]  carvedilol (COREG) 6.25 MG tablet Take 1 tablet (6.25 mg total) by mouth 2 (two) times daily. 02/04/22  Yes Evans Lance, MD  famotidine (PEPCID) 20 MG tablet Take 20 mg by mouth daily as needed for heartburn.    Yes [provider]  flecainide (TAMBOCOR) 100 MG tablet Take 1 tablet (100 mg total) by mouth 2 (two) times daily. 02/04/22  Yes Evans Lance, MD  gabapentin (NEURONTIN) 800 MG tablet TAKE ONE TABLET BY MOUTH TWICE DAILY 08/15/22  Yes [name], MD  hydrochlorothiazide (HYDRODIURIL) 25 MG tablet Take 1 tablet (25 mg total) by mouth daily. 02/04/22  Yes Evans Lance, MD  omeprazole (PRILOSEC) 20 MG capsule Take 20 mg by mouth every other day.    Yes [provider]  sodium chloride (OCEAN) 0.65 % SOLN nasal spray Place 1 spray into both nostrils daily.   Yes [provider]  apixaban (ELIQUIS) 5 MG TABS tablet Take 1 tablet (5 mg total) by mouth 2 (two) times daily. 02/04/22   Evans Lance, MD  Evolocumab (REPATHA SURECLICK) [id number] MG/ML SOAJ INJECT CONTENTS OF 1 PEN SUBCUTANEOUSLY EVERY 14 DAYS. 01/12/22   Freada Bergeron, MD  fluticasone (FLONASE) 50 MCG/ACT nasal spray Place 1 spray into both nostrils daily. Patient not taking: Reported on 07/08/2022 04/23/22   Raspet, Derry Skill, PA-C    Current Outpatient Medications  Medication Sig Dispense Refill   acetaminophen (TYLENOL) 650 MG CR tablet Take 650 mg by mouth See admin instructions. Take 650 mg at 11 am and take 650 mg at 4-5 am (take with gabapentin)     allopurinol (ZYLOPRIM) 100 MG tablet TAKE ONE TABLET BY MOUTH DAILY 90 tablet 3   Aloe-Sodium Chloride (AYR SALINE NASAL GEL NA) Place 1 application into the nose at bedtime.     b complex vitamins capsule Take 1 capsule by mouth daily.     carvedilol (COREG) 6.25 MG tablet Take 1 tablet (6.25 mg total) by mouth 2 (two) times daily. 180 tablet 3   famotidine (PEPCID) 20 MG tablet Take 20 mg  by mouth daily as needed for heartburn.      flecainide (TAMBOCOR) 100 MG tablet Take 1 tablet (100 mg total) by mouth 2 (two) times daily. 180 tablet 3   gabapentin (NEURONTIN) 800 MG tablet TAKE ONE TABLET BY MOUTH TWICE DAILY 180 tablet 1   hydrochlorothiazide (HYDRODIURIL) 25 MG tablet Take 1 tablet (25 mg total) by mouth daily. 90 tablet 3   omeprazole (PRILOSEC) 20 MG capsule Take 20 mg by mouth every other day.      sodium chloride (OCEAN) 0.65 % SOLN nasal spray Place 1 spray into both nostrils daily.     apixaban (ELIQUIS) 5 MG TABS tablet Take 1 tablet (  5 mg total) by mouth 2 (two) times daily. 60 tablet 11   Evolocumab (REPATHA SURECLICK) [id number] MG/ML SOAJ INJECT CONTENTS OF 1 PEN SUBCUTANEOUSLY EVERY 14 DAYS. 6 mL 3   fluticasone (FLONASE) 50 MCG/ACT nasal spray Place 1 spray into both nostrils daily. (Patient not taking: Reported on 07/08/2022) 16 g 0   Current Facility-Administered Medications  Medication Dose Route Frequency Provider Last Rate Last Admin   0.9 %  sodium chloride infusion  500 mL Intravenous Once Debe Anfinson, Carlota Raspberry, MD        Allergies as of 09/09/2022 - Review Complete 09/09/2022  Allergen Reaction Noted   Tramadol hcl  07/01/2020   Acetaminophen Other (See Comments)    Nsaids Other (See Comments) 10/08/2018   Pravastatin Other (See Comments) 03/21/2019    Family History  Problem Relation Age of Onset   Coronary artery disease Mother    CVA Mother    Alcohol abuse Father        variceal hemorrhage   Liver disease Father        alcohol related   Alcohol abuse Sister    Diabetes Sister    Breast cancer Sister    Lung cancer Sister    Esophageal cancer Sister    Cancer Brother        head and neck   Alcohol abuse Other        Strong family Hx   Colon cancer Neg Hx    Pancreatic cancer Neg Hx    Stomach cancer Neg Hx    Rectal cancer Neg Hx     Social History   Socioeconomic History   Marital status: Married    Spouse name: Not on file    Number of children: Not on file   Years of education: Not on file   Highest education level: Not on file  Occupational History   Not on file  Tobacco Use   Smoking status: Former    Packs/day: 1.00    Years: 15.00    Total pack years: 15.00    Types: Cigarettes    Quit date: 08/01/1977    Years since quitting: 45.1   Smokeless tobacco: Never  Vaping Use   Vaping Use: Never used  Substance and Sexual Activity   Alcohol use: No    Comment: past history -none in 9 yrs   Drug use: No    Comment: past hx none in 16 yrs   Sexual activity: Yes    Partners: Female  Other Topics Concern   Not on file  Social History Narrative   [place] BA, [place], [place] psych. Married- '84. 2 step-daughters, 4 grandchildren. Work  Retired Radio producer  Marriage in good health.   Two story home   Drinks caffeine   Ambidextrous    Social Determinants of Health   Financial Resource Strain: Not on file  Food Insecurity: Not on file  Transportation Needs: Not on file  Physical Activity: Not on file  Stress: Not on file  Social Connections: Not on file  Intimate Partner Violence: Not on file    Review of Systems: All other review of systems negative except as mentioned in the HPI.  Physical Exam: Vital signs BP 120/74   Pulse 76   Temp (!) 97.5 F (36.4 C)   Ht 6' (1.829 m)   Wt 207 lb (93.9 kg)   SpO2 97%   BMI 28.07 kg/m   General:   Alert,  Well-developed, pleasant and  cooperative in NAD Lungs:  Clear throughout to auscultation.   Heart:  Regular rate and rhythm Abdomen:  Soft, nontender and nondistended.   Neuro/Psych:  Alert and cooperative. Normal mood and affect. A and O x 3  Jolly Mango, MD [facility]astroenterology

## 2022-09-09 NOTE — Progress Notes (Signed)
Report given to PACU, vss 

## 2022-09-09 NOTE — Patient Instructions (Signed)
YOU HAD AN ENDOSCOPIC PROCEDURE TODAY AT [facility]:   Refer to the procedure report that was given to you for any specific questions about what was found during the examination.  If the procedure report does not answer your questions, please call your gastroenterologist to clarify.  If you requested that your care partner not be given the details of your procedure findings, then the procedure report has been included in a sealed envelope for you to review at your convenience later.  Resume previous diet. Continue present medications. Resume Eliquis tomorrow. Await pathology results.  Please read over handouts about polyps, diverticulosis and hemorrhoids  YOU SHOULD EXPECT: Some feelings of bloating in the abdomen. Passage of more gas than usual.  Walking can help get rid of the air that was put into your GI tract during the procedure and reduce the bloating. If you had a lower endoscopy (such as a colonoscopy or flexible sigmoidoscopy) you may notice spotting of blood in your stool or on the toilet paper. If you underwent a bowel prep for your procedure, you may not have a normal bowel movement for a few days.  Please Note:  You might notice some irritation and congestion in your nose or some drainage.  This is from the oxygen used during your procedure.  There is no need for concern and it should clear up in a day or so.  SYMPTOMS TO REPORT IMMEDIATELY:  Following lower endoscopy (colonoscopy or flexible sigmoidoscopy):  Excessive amounts of blood in the stool  Significant tenderness or worsening of abdominal pains  Swelling of the abdomen that is new, acute  Fever of 100F or higher  Following upper endoscopy (EGD)  Vomiting of blood or coffee ground material  New chest pain or pain under the shoulder blades  Painful or persistently difficult swallowing  New shortness of breath  Fever of 100F or higher  Black, tarry-looking stools  For urgent or emergent issues, a  gastroenterologist can be reached at any hour by calling [phone]. Do not use MyChart messaging for urgent concerns.    DIET:  We do recommend a small meal at first, but then you may proceed to your regular diet.  Drink plenty of fluids but you should avoid alcoholic beverages for 24 hours.  ACTIVITY:  You should plan to take it easy for the rest of today and you should NOT DRIVE or use heavy machinery until tomorrow (because of the sedation medicines used during the test).    FOLLOW UP: Our staff will call the number listed on your records the next business day following your procedure.  We will call around 7:15- 8:00 am to check on you and address any questions or concerns that you may have regarding the information given to you following your procedure. If we do not reach you, we will leave a message.     If any biopsies were taken you will be contacted by phone or by letter within the next 1-3 weeks.  Please call us at [phone] if you have not heard about the biopsies in 3 weeks.    SIGNATURES/CONFIDENTIALITY: You and/or your care partner have signed paperwork which will be entered into your electronic medical record.  These signatures attest to the fact that that the information above on your After Visit Summary has been reviewed and is understood.  Full responsibility of the confidentiality of this discharge information lies with you and/or your care-partner.

## 2022-09-09 NOTE — Op Note (Signed)
[facility] Patient Name: Timothy Dickson Procedure Date: 09/09/2022 11:48 AM MRN: [id number] Endoscopist: Remo Lipps P. Havery Moros , MD, [id number] Age: 77 Referring MD:  Date of Birth: 05-15-1946 Gender: Male Account #: [id number] Procedure:                Colonoscopy Indications:              Screening for colorectal malignant neoplasm Medicines:                Monitored Anesthesia Care Procedure:                Pre-Anesthesia Assessment:                           - Prior to the procedure, a History and Physical                            was performed, and patient medications and                            allergies were reviewed. The patient's tolerance of                            previous anesthesia was also reviewed. The risks                            and benefits of the procedure and the sedation                            options and risks were discussed with the patient.                            All questions were answered, and informed consent                            was obtained. Prior Anticoagulants: The patient has                            taken Eliquis (apixaban), last dose was 2 days                            prior to procedure. ASA Grade Assessment: III - A                            patient with severe systemic disease. After                            reviewing the risks and benefits, the patient was                            deemed in satisfactory condition to undergo the                            procedure.  After obtaining informed consent, the colonoscope                            was passed under direct vision. Throughout the                            procedure, the patient's blood pressure, pulse, and                            oxygen saturations were monitored continuously. The                            CF [id number] was introduced through the anus                            and advanced to the the cecum,  identified by                            appendiceal orifice and ileocecal valve. The                            colonoscopy was performed without difficulty. The                            patient tolerated the procedure well. The quality                            of the bowel preparation was adequate. The                            ileocecal valve, appendiceal orifice, and rectum                            were photographed. Scope In: 12:04:52 PM Scope Out: 12:25:19 PM Scope Withdrawal Time: 0 hours 16 minutes 55 seconds  Total Procedure Duration: 0 hours 20 minutes 27 seconds  Findings:                 The perianal and digital rectal examinations were                            normal.                           The terminal ileum appeared normal.                           A 3 mm polyp was found in the ascending colon. The                            polyp was sessile. The polyp was removed with a                            cold snare. Resection and retrieval were complete.  Three flat and sessile polyps were found in the                            hepatic flexure. The polyps were 2 to 4 mm in size.                            These polyps were removed with a cold snare.                            Resection and retrieval were complete.                           A few small-mouthed diverticula were found in the                            sigmoid colon.                           Internal hemorrhoids were found during                            retroflexion. The hemorrhoids were small.                           The exam was otherwise without abnormality. Complications:            No immediate complications. Estimated blood loss:                            Minimal. Estimated Blood Loss:     Estimated blood loss was minimal. Impression:               - The examined portion of the ileum was normal.                           - One 3 mm polyp in the ascending colon,  removed                            with a cold snare. Resected and retrieved.                           - Three 2 to 4 mm polyps at the hepatic flexure,                            removed with a cold snare. Resected and retrieved.                           - Diverticulosis in the sigmoid colon.                           - Internal hemorrhoids.                           - The examination was otherwise normal. Recommendation:           -  Patient has a contact number available for                            emergencies. The signs and symptoms of potential                            delayed complications were discussed with the                            patient. Return to normal activities tomorrow.                            Written discharge instructions were provided to the                            patient.                           - Resume previous diet.                           - Continue present medications.                           - Resume Eliquis tomorrow.                           - Await pathology results. Remo Lipps P. Havery Moros, MD 09/09/2022 12:29:54 PM This report has been signed electronically.

## 2022-09-09 NOTE — Progress Notes (Signed)
Called to room to assist during endoscopic procedure.  Patient ID and intended procedure confirmed with present staff. Received instructions for my participation in the procedure from the performing physician.  

## 2022-09-12 ENCOUNTER — Telehealth: Payer: Self-pay

## 2022-09-12 NOTE — Telephone Encounter (Signed)
Left message on answering machine. 

## 2022-09-18 ENCOUNTER — Encounter: Payer: Self-pay | Admitting: Gastroenterology

## 2022-09-21 ENCOUNTER — Ambulatory Visit: Payer: Medicare Other | Admitting: Family

## 2022-09-21 ENCOUNTER — Other Ambulatory Visit: Payer: Self-pay

## 2022-09-21 ENCOUNTER — Ambulatory Visit (HOSPITAL_COMMUNITY)
Admission: EM | Admit: 2022-09-21 | Discharge: 2022-09-21 | Disposition: A | Discharge status: Home / Self Care | Payer: Medicare Other | Attending: Emergency Medicine | Admitting: Emergency Medicine

## 2022-09-21 ENCOUNTER — Encounter (HOSPITAL_COMMUNITY): Payer: Self-pay | Admitting: Emergency Medicine

## 2022-09-21 DIAGNOSIS — J0110 Acute frontal sinusitis, unspecified: Secondary | ICD-10-CM | POA: Diagnosis not present

## 2022-09-21 DIAGNOSIS — I4891 Unspecified atrial fibrillation: Secondary | ICD-10-CM

## 2022-09-21 DIAGNOSIS — I10 Essential (primary) hypertension: Secondary | ICD-10-CM

## 2022-09-21 MED ORDER — KETOROLAC TROMETHAMINE 30 MG/ML IJ SOLN
INTRAMUSCULAR | Status: AC
  Filled 2022-09-21: qty 1

## 2022-09-21 MED ORDER — KETOROLAC TROMETHAMINE 60 MG/2ML IM SOLN
30.0000 mg | Freq: Once | INTRAMUSCULAR | Status: AC
  Administered 2022-09-21: 30 mg via INTRAMUSCULAR

## 2022-09-21 MED ORDER — AMOXICILLIN-POT CLAVULANATE 875-125 MG PO TABS: 1.0000 | ORAL_TABLET | Freq: Two times a day (BID) | ORAL | 0 refills | Status: DC

## 2022-09-21 NOTE — ED Provider Notes (Signed)
[facility]    CSN: [id number] Arrival date & time: 09/21/22  1023      History   Chief Complaint Chief Complaint  Patient presents with   URI    HPI Timothy Dickson is a 77 y.o. male.   Sore throat Sunday and Monday, reports severe sinus pressure and a headache that kept him up all night. Mild cough. Denies fevers, has felt cold chills the past few days. Current pain 7/10, worse at night. No recent sick contacts.   He can only take Tylenol, has been unable to take NSAIDs d/t comorbid.   The history is provided by the patient.  URI Presenting symptoms: no cough, no ear pain, no fever and no sore throat   Associated symptoms: sinus pain   Associated symptoms: no arthralgias     Past Medical History:  Diagnosis Date   Atrial fibrillation ([place]) 2019   Atrial tachycardia    documented by holter 2016. Followed by Dr. Liane Comber and Dr. Rayann Heman.   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 no further tx other than surgery [facility].   Cirrhosis ([place])    [facility] dx, early stage- follow up visits at [facility].   CRI (chronic renal insufficiency)    s/p R nephrectomy in 1999 for malignancy   Diabetes mellitus without complication (HCC)    NIDDM- dx. 3-4 years ago diet control   Diverticulosis of colon    mild   GERD (gastroesophageal reflux disease)    Headache(784.0)    Hepatitis C    s/p therapy [facility] (curative)/with Harvoni   History of BPH    no recent issues   History of substance abuse ([place])    past history -none in 38 yrs   Hyperglycemia    Hyperlipemia    Hypertension    Osteoarthritis, knee    knees bilaterally    Pneumonia 1975   hx of 35 years ago    Patient Active Problem List   Diagnosis Date Noted   Neuropathy 09/28/2021   Controlled type 2 diabetes mellitus without complication, without long-term current use of insulin ([place]) 09/11/2020   Gout 09/11/2020   Persistent atrial fibrillation ([place]) 09/06/2019   Secondary hypercoagulable state ([place])  09/06/2019   AI (aortic insufficiency) 12/21/2015   Ascending aortic aneurysm ([place]) 12/21/2015   History of renal cell cancer s/p right nephrectomy 1999 04/16/2015   Ulnar neuropathy of both upper extremities 10/13/2014   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) ([place]) 08/29/2014   Dyslipidemia associated with type 2 diabetes mellitus ([place]) 10/28/2013   Hypertension associated with diabetes ([place]) 10/04/2012   Chronic low back pain 05/22/2012   OA (osteoarthritis) of knee 10/26/2007    Past Surgical History:  Procedure Laterality Date   Arthroscopy right knee     may '11 (Dr Alvan Dame)   Arthroscopy, left knee,     ATRIAL FIBRILLATION ABLATION N/A 10/11/2018   Procedure: ATRIAL FIBRILLATION ABLATION;  Surgeon: Thompson Grayer, MD;  Location: [facility];  Service: Cardiovascular;  Laterality: N/A;   ATRIAL FIBRILLATION ABLATION N/A 06/19/2020   Procedure: ATRIAL FIBRILLATION ABLATION;  Surgeon: Thompson Grayer, MD;  Location: [facility];  Service: Cardiovascular;  Laterality: N/A;   CARDIAC ELECTROPHYSIOLOGY MAPPING AND ABLATION     history of liver biopsy  2007   History of Nephrectomy Right 1999   implantable loop recorder placement  03/19/2020    Medtronic Reveal Linq model LNQ 22 ([id number] ) implantable loop recorder  implanted by Dr Rayann Heman for evaluation of palpitations and afib management post ablation   KNEE ARTHROSCOPY Left 10/10/2012   Procedure: LEFT KNEE ARTHROSCOPY WITH MENSCIAL DEBRIDEMENT AND CHONDROPLASTY;  Surgeon: Gearlean Alf, MD;  Location: [facility];  Service: Orthopedics;  Laterality: Left;   left shoulder repair for bone spur  1990's   TEE WITHOUT CARDIOVERSION N/A 10/11/2018   Procedure: TRANSESOPHAGEAL ECHOCARDIOGRAM (TEE);  Surgeon: Buford Dresser, MD;  Location[facility];  Service: Cardiovascular;  Laterality: N/A;  ablation to follow at [facility] Right 08/15/2016   Procedure: RIGHT TOTAL KNEE ARTHROPLASTY,  CORTISONE INJECTION OF LEFT KNEE;  Surgeon: Gaynelle Arabian, MD;  Location: [facility];  Service: Orthopedics;  Laterality: Right;   TOTAL KNEE ARTHROPLASTY Left 09/04/2017   Procedure: LEFT TOTAL KNEE ARTHROPLASTY;  Surgeon: Gaynelle Arabian, MD;  Location: [facility];  Service: Orthopedics;  Laterality: Left;   Transurethral needle ablation procedure     TUNA procedure         Home Medications    Prior to Admission medications   Medication Sig Start Date End Date Taking? Authorizing Provider  amoxicillin-clavulanate (AUGMENTIN) 875-125 MG tablet Take 1 tablet by mouth every 12 (twelve) hours. 09/21/22  Yes Louretta Shorten, [place] N, FNP  acetaminophen (TYLENOL) 650 MG CR tablet Take 650 mg by mouth See admin instructions. Take 650 mg at 11 am and take 650 mg at 4-5 am (take with gabapentin)    [provider]  allopurinol (ZYLOPRIM) 100 MG tablet TAKE ONE TABLET BY MOUTH DAILY 10/25/21   [name], MD  Aloe-Sodium Chloride (AYR SALINE NASAL GEL NA) Place 1 application into the nose at bedtime.    [provider]  apixaban (ELIQUIS) 5 MG TABS tablet Take 1 tablet (5 mg total) by mouth 2 (two) times daily. 02/04/22   Evans Lance, MD  b complex vitamins capsule Take 1 capsule by mouth daily.    [provider]  carvedilol (COREG) 6.25 MG tablet Take 1 tablet (6.25 mg total) by mouth 2 (two) times daily. 02/04/22   Evans Lance, MD  Evolocumab (REPATHA SURECLICK) [id number] MG/ML SOAJ INJECT CONTENTS OF 1 PEN SUBCUTANEOUSLY EVERY 14 DAYS. 01/12/22   Freada Bergeron, MD  famotidine (PEPCID) 20 MG tablet Take 20 mg by mouth daily as needed for heartburn.     [provider]  flecainide (TAMBOCOR) 100 MG tablet Take 1 tablet (100 mg total) by mouth 2 (two) times daily. 02/04/22   Evans Lance, MD  fluticasone (FLONASE) 50 MCG/ACT nasal spray Place 1 spray into both nostrils daily. 04/23/22   Raspet, Derry Skill, PA-C  gabapentin (NEURONTIN) 800 MG tablet TAKE ONE TABLET BY MOUTH  TWICE DAILY 08/15/22   [name], MD  hydrochlorothiazide (HYDRODIURIL) 25 MG tablet Take 1 tablet (25 mg total) by mouth daily. 02/04/22   Evans Lance, MD  omeprazole (PRILOSEC) 20 MG capsule Take 20 mg by mouth every other day.     [provider]  sodium chloride (OCEAN) 0.65 % SOLN nasal spray Place 1 spray into both nostrils daily.    [provider]    Family History Family History  Problem Relation Age of Onset   Coronary artery disease Mother    CVA Mother    Alcohol abuse Father        variceal hemorrhage   Liver disease Father        alcohol related   Alcohol abuse Sister  Diabetes Sister    Breast cancer Sister    Lung cancer Sister    Esophageal cancer Sister    Cancer Brother        head and neck   Alcohol abuse Other        Strong family Hx   Colon cancer Neg Hx    Pancreatic cancer Neg Hx    Stomach cancer Neg Hx    Rectal cancer Neg Hx     Social History Social History   Tobacco Use   Smoking status: Former    Packs/day: 1.00    Years: 15.00    Total pack years: 15.00    Types: Cigarettes    Quit date: 08/01/1977    Years since quitting: 45.1   Smokeless tobacco: Never  Vaping Use   Vaping Use: Never used  Substance Use Topics   Alcohol use: No    Comment: past history -none in 73 yrs   Drug use: No    Comment: past hx none in 38 yrs     Allergies   Tramadol hcl, Acetaminophen, Nsaids, and Pravastatin   Review of Systems Review of Systems  Constitutional:  Negative for chills and fever.  HENT:  Positive for postnasal drip, sinus pressure and sinus pain. Negative for ear pain and sore throat.   Eyes:  Negative for pain and visual disturbance.  Respiratory:  Negative for cough and shortness of breath.   Cardiovascular:  Negative for chest pain and palpitations.  Gastrointestinal:  Negative for abdominal pain and vomiting.  Musculoskeletal:  Negative for arthralgias and back pain.  All other systems reviewed and  are negative.    Physical Exam Triage Vital Signs ED Triage Vitals  Enc Vitals Group     BP 09/21/22 1221 117/76     Pulse Rate 09/21/22 1221 61     Resp 09/21/22 1221 20     Temp 09/21/22 1221 98 F (36.7 C)     Temp Source 09/21/22 1221 Oral     SpO2 09/21/22 1221 95 %     Weight --      Height --      Head Circumference --      Peak Flow --      Pain Score 09/21/22 1218 7     Pain Loc --      Pain Edu? --      Excl. in [place]? --    No data found.  Updated Vital Signs BP 117/76 (BP Location: Right Arm)   Pulse 61   Temp 98 F (36.7 C) (Oral)   Resp 20   SpO2 95%   Visual Acuity Right Eye Distance:   Left Eye Distance:   Bilateral Distance:    Right Eye Near:   Left Eye Near:    Bilateral Near:     Physical Exam Vitals and nursing note reviewed.  Constitutional:      General: He is not in acute distress.    Appearance: Normal appearance. He is well-developed.     Comments: Pleasant 77 year old male who appears stated age.  HENT:     Head: Normocephalic and atraumatic.     Comments: Frontal and maxillary sinus tenderness on palpation.    Right Ear: Tympanic membrane, ear canal and external ear normal.     Left Ear: Tympanic membrane, ear canal and external ear normal.     Nose: Congestion present.     Mouth/Throat:     Mouth: Mucous membranes are moist.  Pharynx: Posterior oropharyngeal erythema present.  Eyes:     Conjunctiva/sclera: Conjunctivae normal.  Cardiovascular:     Rate and Rhythm: Normal rate and regular rhythm.     Heart sounds: Normal heart sounds. No murmur heard. Pulmonary:     Effort: Pulmonary effort is normal. No respiratory distress.     Breath sounds: Normal breath sounds.  Musculoskeletal:        General: No swelling.     Cervical back: Neck supple.  Skin:    General: Skin is warm and dry.     Capillary Refill: Capillary refill takes less than 2 seconds.  Neurological:     Mental Status: He is alert.  Psychiatric:         Mood and Affect: Mood normal.        Behavior: Behavior is cooperative.      UC Treatments / Results  Labs (all labs ordered are listed, but only abnormal results are displayed) Labs Reviewed - No data to display  EKG   Radiology No results found.  Procedures Procedures (including critical care time)  Medications Ordered in UC Medications  ketorolac (TORADOL) injection 30 mg (has no administration in time range)    Initial Impression / Assessment and Plan / UC Course  I have reviewed the triage vital signs and the nursing notes.  Pertinent labs & imaging results that were available during my care of the patient were reviewed by me and considered in my medical decision making (see chart for details).  Vitals and triage note reviewed, patient is hemodynamically stable.  Discussed sinusitis is likely viral in nature.  Frontal and maxillary sinus tenderness with posterior pharynx erythema, nasal congestion.  Headache 7 out of 10, given 1 dose of 30 mg IM Toradol.  Recent GFR and creatinine within normal limits.  Advised to continue with Tylenol as needed for aches and pains.  Patient is traveling soon, given printed prescription for Augmentin if symptoms persist over the next 3 to 4 days to cover for bacterial sinusitis.  Plan of care discussed, return precautions given, patient verbalized understanding.    Final Clinical Impressions(s) / UC Diagnoses   Final diagnoses:  Acute non-recurrent frontal sinusitis  Atrial fibrillation, unspecified type [facility])  Essential hypertension     Discharge Instructions      You appear to have sinusitis on your exam today with sinus tenderness.  You are out of the window for flu testing and had a negative home COVID test.  Your symptoms started Sunday, at this point it is too early for antibiotics.  Since you are traveling soon, I will give you a printed prescription for Augmentin if your symptoms persist over the next few days.  If you start  this, please take antibiotic as prescribed.  You can take it with food to help with GI upset.  You can do sinus rinses with a Nettie pot and distilled water.  You can also sleep with a humidifier to help with congestion.  You can take Tylenol as needed for aches and pains.     ED Prescriptions     Medication Sig Dispense Auth. Provider   amoxicillin-clavulanate (AUGMENTIN) 875-125 MG tablet Take 1 tablet by mouth every 12 (twelve) hours. 14 tablet Shamar Kracke, [place] N, [place]      I have reviewed the PDMP during this encounter.   Jamani Eley, [place] N, [place] 09/21/22 1246

## 2022-09-21 NOTE — ED Triage Notes (Signed)
Slight sore throat on Sunday and gradually symptoms have increased and worsened.  Complains of headache in forehead and face, sore throat is minimal, slight cough.  Reports productive cough in morning-reports greenish-yellow production thought to be coming from sinus.    Patient has taken tylenol.  Patient reports he is on a blood thinner daily.

## 2022-09-21 NOTE — Discharge Instructions (Addendum)
You appear to have sinusitis on your exam today with sinus tenderness.  You are out of the window for flu testing and had a negative home COVID test.  Your symptoms started Sunday, at this point it is too early for antibiotics.  Since you are traveling soon, I will give you a printed prescription for Augmentin if your symptoms persist over the next few days.  If you start this, please take antibiotic as prescribed.  You can take it with food to help with GI upset.  You can do sinus rinses with a Nettie pot and distilled water.  You can also sleep with a humidifier to help with congestion.  You can take Tylenol as needed for aches and pains.

## 2022-09-29 ENCOUNTER — Ambulatory Visit (INDEPENDENT_AMBULATORY_CARE_PROVIDER_SITE_OTHER): Payer: Medicare Other | Admitting: Family Medicine

## 2022-09-29 ENCOUNTER — Encounter: Payer: Self-pay | Admitting: Family Medicine

## 2022-09-29 VITALS — BP 119/66 | HR 57 | Temp 98.2°F | Ht 72.0 in | Wt 206.2 lb

## 2022-09-29 DIAGNOSIS — R059 Cough, unspecified: Secondary | ICD-10-CM | POA: Diagnosis not present

## 2022-09-29 DIAGNOSIS — I152 Hypertension secondary to endocrine disorders: Secondary | ICD-10-CM | POA: Diagnosis not present

## 2022-09-29 DIAGNOSIS — E119 Type 2 diabetes mellitus without complications: Secondary | ICD-10-CM | POA: Diagnosis not present

## 2022-09-29 DIAGNOSIS — E1159 Type 2 diabetes mellitus with other circulatory complications: Secondary | ICD-10-CM

## 2022-09-29 LAB — POCT GLYCOSYLATED HEMOGLOBIN (HGB A1C): Hemoglobin A1C: 6.5 % — AB (ref 4.0–5.6)

## 2022-09-29 LAB — POC COVID19 BINAXNOW: SARS Coronavirus 2 Ag: NEGATIVE

## 2022-09-29 NOTE — Patient Instructions (Signed)
It was very nice to see you today!  Your A1c today is at goal.  Keep up the good work with your diet and exercise!  Will see back in 6 months for your annual physical.  Come back sooner if needed.  Take care, Dr Jerline Pain  PLEASE NOTE:  If you had any lab tests, please let us know if you have not heard back within a few days. You may see your results on mychart before we have a chance to review them but we will give you a call once they are reviewed by [place].   If we ordered any referrals today, please let us know if you have not heard from their office within the next week.   If you had any urgent prescriptions sent in today, please check with the pharmacy within an hour of our visit to make sure the prescription was transmitted appropriately.   Please try these tips to maintain a healthy lifestyle:  Eat at least 3 REAL meals and 1-2 snacks per day.  Aim for no more than 5 hours between eating.  If you eat breakfast, please do so within one hour of getting up.   Each meal should contain half fruits/vegetables, one quarter protein, and one quarter carbs (no bigger than a computer mouse)  Cut down on sweet beverages. This includes juice, soda, and sweet tea.   Drink at least 1 glass of water with each meal and aim for at least 8 glasses per day  Exercise at least 150 minutes every week.

## 2022-09-29 NOTE — Assessment & Plan Note (Signed)
A1c well-controlled 6.5 off meds.  Continue lifestyle modifications.  Recheck in 6 months for CPE.

## 2022-09-29 NOTE — Assessment & Plan Note (Signed)
Blood pressure at goal on Coreg 6.25 mg twice daily and HCTZ 25 mg daily.

## 2022-09-29 NOTE — Progress Notes (Signed)
   Timothy Dickson is a 77 y.o. male who presents today for an office visit.  Assessment/Plan:  New/Acute Problems: Cough No red flags.  Has completed course of Augmentin.  Likely has viral URI and now postinfectious cough.  He has been using over-the-counter meds with improvement.  We discussed prescription cough medication however he declined.  He can continue using over-the-counter meds.  Encouraged hydration.  We discussed reasons to return to care.  Follow-up as needed.  Chronic Problems Addressed Today: Controlled type 2 diabetes mellitus without complication, without long-term current use of insulin (HCC) A1c well-controlled 6.5 off meds.  Continue lifestyle modifications.  Recheck in 6 months for CPE.  Hypertension associated with diabetes ([place]) Blood pressure at goal on Coreg 6.25 mg twice daily and HCTZ 25 mg daily.     Subjective:  HPI:  See A/p for status of chronic conditions.   He has been dealing with URI symptoms over the last week or so. Went to urgent care and was given antibiotics.  He is not sure..  Still has persistent cough but symptoms do seem to be improving.  No fevers or chills.  No difficulty breathing.       Objective:  Physical Exam: BP 119/66   Pulse (!) 57   Temp 98.2 F (36.8 C) (Temporal)   Ht 6' (1.829 m)   Wt 206 lb 3.2 oz (93.5 kg)   SpO2 98%   BMI 27.97 kg/m   Gen: No acute distress, resting comfortably HEENT: OP erythematous.  No exudate CV: Regular rate and rhythm with no murmurs appreciated Pulm: Normal work of breathing, clear to auscultation bilaterally with no crackles, wheezes, or rhonchi Neuro: Grossly normal, moves all extremities Psych: Normal affect and thought content      Davon Abdelaziz M. Jerline Pain, MD 09/29/2022 2:27 PM

## 2022-10-03 ENCOUNTER — Ambulatory Visit (INDEPENDENT_AMBULATORY_CARE_PROVIDER_SITE_OTHER): Payer: Medicare Other

## 2022-10-03 DIAGNOSIS — I4819 Other persistent atrial fibrillation: Secondary | ICD-10-CM | POA: Diagnosis not present

## 2022-10-03 LAB — CUP PACEART REMOTE DEVICE CHECK
Date Time Interrogation Session: 20240303230348
Implantable Pulse Generator Implant Date: 20210819

## 2022-10-11 NOTE — Progress Notes (Signed)
Carelink Summary Report / Loop Recorder 

## 2022-10-24 ENCOUNTER — Encounter: Payer: Self-pay | Admitting: Cardiology

## 2022-10-24 ENCOUNTER — Ambulatory Visit: Payer: Medicare Other | Attending: Cardiology | Admitting: Cardiology

## 2022-10-24 VITALS — BP 112/68 | HR 70 | Ht 72.0 in | Wt 204.2 lb

## 2022-10-24 DIAGNOSIS — D6869 Other thrombophilia: Secondary | ICD-10-CM

## 2022-10-24 DIAGNOSIS — E782 Mixed hyperlipidemia: Secondary | ICD-10-CM | POA: Diagnosis not present

## 2022-10-24 DIAGNOSIS — I4819 Other persistent atrial fibrillation: Secondary | ICD-10-CM

## 2022-10-24 DIAGNOSIS — I10 Essential (primary) hypertension: Secondary | ICD-10-CM

## 2022-10-24 DIAGNOSIS — I7121 Aneurysm of the ascending aorta, without rupture: Secondary | ICD-10-CM

## 2022-10-24 NOTE — Progress Notes (Signed)
Cardiology Office Note:    Date:  10/24/2022   ID:  BERKAY PIGNONE, DOB [birth year], MRN [id number]  PCP:  [name], MD   [facility] Providers Cardiologist:  Ena Dawley, MD Electrophysiologist:  Thompson Grayer, MD (Inactive)  {    Referring MD: [name], MD    History of Present Illness:    TORONTO TRABERT is a 77 y.o. male with a hx of  hypertension, ascending aortic aneurysm, hyperlipidemia, coronary calcifications, persistent atrial fibrillation and SVT who failed medical therapy (Flecainide) and underwent EPS/RFCA of atrial fibrillation in 09/2018 who now presents to clinic for follow-up. The patient was previously followed by Dr. Meda Coffee.  Per review of the record, the patient has a history of paroxysmal Afib/flutter s/p successful ablation in 09/2018 followed by Dr. Rayann Heman. ILR implanted in 2021 for palpitations shows 0% burden of Afib. Flec has been decreased to 50mg  BID per Dr. Rayann Heman. Last saw Dr. Rayann Heman on 12/23/20 where he was doing well without palpitations.  Was seen by Tommye Standard on 03/242/23 where he was having vision changes. ILR showed 99.9% NSR.   Was last seen in clinic 05/2022 where he was doing well from a CV standpoint.  Today, the patient overall feels well. No chest pain, SOB, LE edema, orthopnea or palpitations. Remains active without exertional symptoms. Blood pressure is well controlled. Tolerating apixaban without bleeding issues.   Past Medical History:  Diagnosis Date   Atrial fibrillation ([place]) 2019   Atrial tachycardia    documented by holter 2016. Followed by Dr. Liane Comber and Dr. Rayann Heman.   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 no further tx other than surgery [facility].   Cirrhosis ([place])    [facility] dx, early stage- follow up visits at [facility].   CRI (chronic renal insufficiency)    s/p R nephrectomy in 1999 for malignancy   Diabetes mellitus without complication (HCC)    NIDDM- dx. 3-4 years ago diet control   Diverticulosis of  colon    mild   GERD (gastroesophageal reflux disease)    Headache(784.0)    Hepatitis C    s/p therapy [facility] (curative)/with Harvoni   History of BPH    no recent issues   History of substance abuse ([place])    past history -none in 38 yrs   Hyperglycemia    Hyperlipemia    Hypertension    Osteoarthritis, knee    knees bilaterally    Pneumonia 1975   hx of 35 years ago    Past Surgical History:  Procedure Laterality Date   Arthroscopy right knee     may '11 (Dr Alvan Dame)   Arthroscopy, left knee,     ATRIAL FIBRILLATION ABLATION N/A 10/11/2018   Procedure: ATRIAL FIBRILLATION ABLATION;  Surgeon: Thompson Grayer, MD;  Location: [place] [facility];  Service: Cardiovascular;  Laterality: N/A;   ATRIAL FIBRILLATION ABLATION N/A 06/19/2020   Procedure: ATRIAL FIBRILLATION ABLATION;  Surgeon: Thompson Grayer, MD;  Location: [place] [facility];  Service: Cardiovascular;  Laterality: N/A;   CARDIAC ELECTROPHYSIOLOGY MAPPING AND ABLATION     history of liver biopsy  2007   History of Nephrectomy Right 1999   implantable loop recorder placement  03/19/2020    Medtronic Reveal Linq model LNQ 22 (RLB [id number] ) implantable loop recorder implanted by Dr Rayann Heman for evaluation of palpitations and afib management post ablation   KNEE ARTHROSCOPY Left 10/10/2012   Procedure: LEFT KNEE ARTHROSCOPY WITH MENSCIAL DEBRIDEMENT AND CHONDROPLASTY;  Surgeon: Gearlean Alf, MD;  Location: [facility];  Service: Orthopedics;  Laterality: Left;   left shoulder repair for bone spur  1990's   TEE WITHOUT CARDIOVERSION N/A 10/11/2018   Procedure: TRANSESOPHAGEAL ECHOCARDIOGRAM (TEE);  Surgeon: Buford Dresser, MD;  Location[facility];  Service: Cardiovascular;  Laterality: N/A;  ablation to follow at [place] Right 08/15/2016   Procedure: RIGHT TOTAL KNEE ARTHROPLASTY, CORTISONE INJECTION OF LEFT KNEE;  Surgeon: Gaynelle Arabian, MD;  Location: [facility];  Service: Orthopedics;  Laterality: Right;    TOTAL KNEE ARTHROPLASTY Left 09/04/2017   Procedure: LEFT TOTAL KNEE ARTHROPLASTY;  Surgeon: Gaynelle Arabian, MD;  Location: [facility];  Service: Orthopedics;  Laterality: Left;   Transurethral needle ablation procedure     TUNA procedure      Current Medications: Current Meds  Medication Sig   acetaminophen (TYLENOL) 650 MG CR tablet Take 650 mg by mouth See admin instructions. Take 650 mg at 11 am and take 650 mg at 4-5 am (take with gabapentin)   allopurinol (ZYLOPRIM) 100 MG tablet TAKE ONE TABLET BY MOUTH DAILY   Aloe-Sodium Chloride (AYR SALINE NASAL GEL NA) Place 1 application into the nose at bedtime.   apixaban (ELIQUIS) 5 MG TABS tablet Take 1 tablet (5 mg total) by mouth 2 (two) times daily.   b complex vitamins capsule Take 1 capsule by mouth daily.   carvedilol (COREG) 6.25 MG tablet Take 1 tablet (6.25 mg total) by mouth 2 (two) times daily.   Evolocumab (REPATHA SURECLICK) [id number] MG/ML SOAJ INJECT CONTENTS OF 1 PEN SUBCUTANEOUSLY EVERY 14 DAYS.   famotidine (PEPCID) 20 MG tablet Take 20 mg by mouth daily as needed for heartburn.    flecainide (TAMBOCOR) 100 MG tablet Take 1 tablet (100 mg total) by mouth 2 (two) times daily.   gabapentin (NEURONTIN) 800 MG tablet TAKE ONE TABLET BY MOUTH TWICE DAILY   hydrochlorothiazide (HYDRODIURIL) 25 MG tablet Take 1 tablet (25 mg total) by mouth daily.   omeprazole (PRILOSEC) 20 MG capsule Take 20 mg by mouth every other day.    sodium chloride (OCEAN) 0.65 % SOLN nasal spray Place 1 spray into both nostrils daily.     Allergies:   Tramadol hcl, Acetaminophen, Nsaids, and Pravastatin   Social History   Socioeconomic History   Marital status: Married    Spouse name: Not on file   Number of children: Not on file   Years of education: Not on file   Highest education level: Not on file  Occupational History   Not on file  Tobacco Use   Smoking status: Former    Packs/day: 1.00    Years: 15.00    Additional pack years: 0.00    Total  pack years: 15.00    Types: Cigarettes    Quit date: 08/01/1977    Years since quitting: 45.2   Smokeless tobacco: Never  Vaping Use   Vaping Use: Never used  Substance and Sexual Activity   Alcohol use: No    Comment: past history -none in 38 yrs   Drug use: No    Comment: past hx none in 38 yrs   Sexual activity: Yes    Partners: Female  Other Topics Concern   Not on file  Social History Narrative   [place], [place], [place] psych. Married- '84. 2 step-daughters, 4 grandchildren. Work  Retired Radio producer  Marriage in good health.   Two story home  Drinks caffeine   Ambidextrous    Social Determinants of Radio broadcast assistant Strain: Not on file  Food Insecurity: Not on file  Transportation Needs: Not on file  Physical Activity: Not on file  Stress: Not on file  Social Connections: Not on file     Family History: The patient's family history includes Alcohol abuse in his father, sister, and another family member; Breast cancer in his sister; CVA in his mother; Cancer in his brother; Coronary artery disease in his mother; Diabetes in his sister; Esophageal cancer in his sister; Liver disease in his father; Lung cancer in his sister. There is no history of Colon cancer, Pancreatic cancer, Stomach cancer, or Rectal cancer.  ROS:   Please see the history of present illness.    Review of Systems  Constitutional:  Negative for chills and fever.  HENT:  Negative for congestion and sore throat.   Eyes:  Negative for blurred vision and pain.  Respiratory:  Negative for cough and shortness of breath.   Cardiovascular:  Negative for chest pain, palpitations, orthopnea, claudication, leg swelling and PND.  Gastrointestinal:  Negative for blood in stool and melena.  Genitourinary:  Negative for hematuria.  Skin:  Negative for itching and rash.  Neurological:  Negative for dizziness and headaches.  Psychiatric/Behavioral:  Negative for memory loss. The  patient does not have insomnia.     All other systems reviewed and are negative.  EKGs/Labs/Other Studies Reviewed:    The following studies were reviewed today:  MRA 04/08/22: IMPRESSION: Relatively unchanged size and configuration of the ascending aorta, estimated 43 cm on the current study. Recommend annual imaging followup by CTA or MRA. This recommendation follows 2010 ACCF/AHA/AATS/ACR/ASA/SCA/SCAI/SIR/STS/SVM Guidelines for the Diagnosis and Management of Patients with Thoracic Aortic Disease. Circulation. 2010; 121JN:9224643. Aortic aneurysm NOS (ICD10-I71.9)  MRI 04/07/2021: IMPRESSION: Relatively unchanged size and configuration of the thoracic aorta, with the maximum estimated diameter of the ascending aorta 43 mm on the current MR study. Recommend annual imaging followup by CTA or MRA. This recommendation follows 2010 ACCF/AHA/AATS/ACR/ASA/SCA/SCAI/SIR/STS/SVM Guidelines for the Diagnosis and Management of Patients with Thoracic Aortic Disease. Circulation. 2010; [id number]:9224643. Aortic aneurysm NOS (ICD10-I71.9) Incidental imaging of exophytic rounded lesion of the left kidney. This may represent a cyst that was identified on prior ultrasound of 2014, however, the current study is nondiagnostic. Nonemergent abdominal ultrasound of the abdomen recommended given the patient's history of right-sided nephrectomy for RCC.  CT Cardiac Morph 06/15/20 1. There is normal pulmonary vein drainage into the left atrium. 2. The left atrial appendage is large with chicken wing morphology, 1 major lobe and ostial size 37 x 24 mm and length 56 mm. There is no thrombus in the left atrial appendage. 3. The esophagus runs to the left from the left atrial midline and is in the proximity to the ostia of the LUPV and LLPV. 4. Mild ascending aortic aneurysm with maximum diameter 41 mm. No dissection, mild diffuse atherosclerotic plaque and calcifications. 5. 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  Echo TEE 10/11/18 1. The left ventricle has low normal systolic function, with an ejection fraction of 50-55%. The cavity size was normal. No evidence of left ventricular regional wall motion abnormalities.   2. The right ventricle has normal systolc function. The cavity was normal. There is no increase in right ventricular wall thickness.   3. Left atrial size was mildly dilated.   4. Right atrial size was mildly dilated.   5.  No evidence of mitral valve stenosis. No mitral valve vegetation visualized.   6. The aortic valve is tricuspid.   7. There is evidence of mild plaque in the descending aorta.   EKG:  No new ECG  Recent Labs: 03/29/2022: TSH 1.03 08/31/2022: ALT 14; BUN 19; Creatinine, Ser 1.24; Hemoglobin 15.4; Platelets 211.0; Potassium 3.9; Sodium 134  Recent Lipid Panel    Component Value Date/Time   CHOL 104 03/29/2022 1333   CHOL 108 04/07/2020 1419   CHOL 206 (H) 12/21/2015 1522   TRIG 82.0 03/29/2022 1333   TRIG 161 (H) 12/21/2015 1522   HDL 47.30 03/29/2022 1333   HDL 48 04/07/2020 1419   HDL 45 12/21/2015 1522   CHOLHDL 2 03/29/2022 1333   VLDL 16.4 03/29/2022 1333   LDLCALC 40 03/29/2022 1333   LDLCALC 41 04/07/2020 1419   LDLCALC 129 12/21/2015 1522     Risk Assessment/Calculations:    CHA2DS2-VASc Score = 5   This indicates a 7.2% annual risk of stroke. The patient's score is based upon: CHF History: 0 HTN History: 1 Diabetes History: 1 Stroke History: 0 Vascular Disease History: 1 Age Score: 2 Gender Score: 0          Physical Exam:    VS:  BP 112/68   Pulse 70   Ht 6' (1.829 m)   Wt 204 lb 3.2 oz (92.6 kg)   SpO2 96%   BMI 27.69 kg/m     Wt Readings from Last 3 Encounters:  10/24/22 204 lb 3.2 oz (92.6 kg)  09/29/22 206 lb 3.2 oz (93.5 kg)  09/09/22 207 lb (93.9 kg)     GEN:  Comfortable, NAD HEENT: Normal NECK: No JVD; No carotid bruits CARDIAC: RRR, no murmurs, rubs, gallops RESPIRATORY:  CTAB, no  wheezes ABDOMEN: Soft, non-tender, non-distended MUSCULOSKELETAL:  No edema; No deformity  SKIN: Warm and dry NEUROLOGIC:  Alert and oriented x 3 PSYCHIATRIC:  Normal affect   ASSESSMENT:    1. Persistent atrial fibrillation ([place])   2. Essential (primary) hypertension   3. Mixed hyperlipidemia   4. Secondary hypercoagulable state ([place])   5. Aneurysm of ascending aorta without rupture (HCC)     PLAN:    In order of problems listed above:  #Paroxysmal Afib/Flutter: CHADs-vasc 3. S/p ablation 09/2018 with <1% burden of Afib on ILR. On flec 100mg  BID and apixaban. Now following with Dr. Lovena Le -Follow-up with EP as scheduled -Continue flec 100mg  BID -Continue eliquis for [facility]  #HTN: Controlled and at goal <130/90. -Continue coreg 6.25mg  BID -Continue HCTZ 25mg  daily  #HLD: -Continue repatha 140mg  q 2weeks -LDL at goal of 40  #AAA: Not on ASA due to need for [facility]. On repatha. MRI on 04/07/2022 shows max diameter 65mm which is overall stable from prior.  Will need annual monitoring. -Continue annual imaging with MRA/CTA -Continue repatha  -Not on ASA due to need for [facility]  #Exophytic rounded lesion of left kidney: Noted incidentally on MRI. Follow-up ultrasound with no hepatic mass or acute process. -Deemed benign with no further work-up needed at this time  Follow up in 6 months.  Medication Adjustments/Labs and Tests Ordered: Current medicines are reviewed at length with the patient today.  Concerns regarding medicines are outlined above.  No orders of the defined types were placed in this encounter.  No orders of the defined types were placed in this encounter.   There are no Patient Instructions on file for this visit.     Signed, Nira Conn  Renae Fickle, MD  10/24/2022 4:11 PM    [facility]

## 2022-10-24 NOTE — Patient Instructions (Signed)
Medication Instructions:   Your physician recommends that you continue on your current medications as directed. Please refer to the Current Medication list given to you today.  *If you need a refill on your cardiac medications before your next appointment, please call your pharmacy*     Follow-Up: At [facility], you and your health needs are our priority.  As part of our continuing mission to provide you with exceptional heart care, we have created designated Provider Care Teams.  These Care Teams include your primary Cardiologist (physician) and Advanced Practice Providers (APPs -  Physician Assistants and Nurse Practitioners) who all work together to provide you with the care you need, when you need it.  We recommend signing up for the patient portal called "MyChart".  Sign up information is provided on this After Visit Summary.  MyChart is used to connect with patients for Virtual Visits (Telemedicine).  Patients are able to view lab/test results, encounter notes, upcoming appointments, etc.  Non-urgent messages can be sent to your provider as well.   To learn more about what you can do with MyChart, go to [email or web address].    Your next appointment:   6 month(s)  Provider:   Dr. Pemberton   

## 2022-10-27 DIAGNOSIS — H524 Presbyopia: Secondary | ICD-10-CM | POA: Diagnosis not present

## 2022-10-27 LAB — HM DIABETES EYE EXAM

## 2022-10-28 ENCOUNTER — Other Ambulatory Visit: Payer: Self-pay | Admitting: Family Medicine

## 2022-11-01 DIAGNOSIS — H524 Presbyopia: Secondary | ICD-10-CM | POA: Diagnosis not present

## 2022-11-07 ENCOUNTER — Ambulatory Visit (INDEPENDENT_AMBULATORY_CARE_PROVIDER_SITE_OTHER): Payer: Medicare Other

## 2022-11-07 DIAGNOSIS — I4819 Other persistent atrial fibrillation: Secondary | ICD-10-CM

## 2022-11-07 LAB — CUP PACEART REMOTE DEVICE CHECK
Date Time Interrogation Session: 20240405230043
Implantable Pulse Generator Implant Date: 20210819

## 2022-11-08 NOTE — Progress Notes (Signed)
Carelink Summary Report / Loop Recorder 

## 2022-11-11 DIAGNOSIS — G5622 Lesion of ulnar nerve, left upper limb: Secondary | ICD-10-CM | POA: Diagnosis not present

## 2022-11-12 ENCOUNTER — Encounter: Payer: Self-pay | Admitting: Internal Medicine

## 2022-11-14 ENCOUNTER — Telehealth: Payer: Self-pay

## 2022-11-14 MED ORDER — HYDROCHLOROTHIAZIDE 25 MG PO TABS: 25.0000 mg | ORAL_TABLET | Freq: Every day | ORAL | 0 refills | Status: DC

## 2022-11-14 NOTE — Patient Outreach (Signed)
  Care Coordination   11/14/2022 Name: Timothy Dickson MRN: [id number] DOB: 12-03-1945   Care Coordination Outreach Attempts:  An unsuccessful telephone outreach was attempted today to offer the patient information about available care coordination services as a benefit of their health plan.   Follow Up Plan:  Additional outreach attempts will be made to offer the patient care coordination information and services.   Encounter Outcome:  No Answer   Care Coordination Interventions:  No, not indicated    Bevelyn Ngo, BSW, CDP Social Worker, Certified Dementia Practitioner [facility]  Care Coordination [phone]

## 2022-11-16 ENCOUNTER — Telehealth: Payer: Self-pay

## 2022-11-16 NOTE — Patient Outreach (Signed)
  Care Coordination   11/16/2022 Name: Timothy Dickson MRN: [id number] DOB: 1946-04-25   Care Coordination Outreach Attempts:  A second unsuccessful outreach was attempted today to offer the patient with information about available care coordination services as a benefit of their health plan.     Follow Up Plan:  Additional outreach attempts will be made to offer the patient care coordination information and services.   Encounter Outcome:  No Answer   Care Coordination Interventions:  No, not indicated    Bevelyn Ngo, BSW, CDP Social Worker, Certified Dementia Practitioner [facility] Care Management  Care Coordination [phone]

## 2022-11-23 ENCOUNTER — Telehealth: Payer: Self-pay

## 2022-11-23 NOTE — Patient Outreach (Signed)
  Care Coordination   11/23/2022 Name: Timothy Dickson MRN: [id number] DOB: 1945-10-10   Care Coordination Outreach Attempts:  A third unsuccessful outreach was attempted today to offer the patient with information about available care coordination services as a benefit of their health plan.   Follow Up Plan:  No further outreach attempts will be made at this time. We have been unable to contact the patient to offer or enroll patient in care coordination services  Encounter Outcome:  No Answer   Care Coordination Interventions:  No, not indicated    Bevelyn Ngo, BSW, CDP Social Worker, Certified Dementia Practitioner [facility] Care Management  Care Coordination [phone]

## 2022-12-06 ENCOUNTER — Ambulatory Visit (INDEPENDENT_AMBULATORY_CARE_PROVIDER_SITE_OTHER): Payer: Medicare Other | Admitting: Family Medicine

## 2022-12-06 ENCOUNTER — Encounter: Payer: Self-pay | Admitting: Family Medicine

## 2022-12-06 VITALS — BP 124/69 | HR 55 | Temp 98.0°F | Ht 72.0 in | Wt 204.8 lb

## 2022-12-06 DIAGNOSIS — E1159 Type 2 diabetes mellitus with other circulatory complications: Secondary | ICD-10-CM

## 2022-12-06 DIAGNOSIS — I152 Hypertension secondary to endocrine disorders: Secondary | ICD-10-CM | POA: Diagnosis not present

## 2022-12-06 DIAGNOSIS — E119 Type 2 diabetes mellitus without complications: Secondary | ICD-10-CM | POA: Diagnosis not present

## 2022-12-06 DIAGNOSIS — G5623 Lesion of ulnar nerve, bilateral upper limbs: Secondary | ICD-10-CM | POA: Diagnosis not present

## 2022-12-06 NOTE — Assessment & Plan Note (Signed)
Follows with orthopedics.  Has upcoming surgery.

## 2022-12-06 NOTE — Assessment & Plan Note (Signed)
Blood pressure at goal on Coreg 6.25 mg twice daily and HCTZ 25 mg daily. 

## 2022-12-06 NOTE — Progress Notes (Signed)
   Timothy Dickson is a 77 y.o. male who presents today for an office visit.  Assessment/Plan:  New/Acute Problems: Lower Rib Pain  Reassuring exam today.  He is likely having recurrent issues with costochondritis versus lower rib pain syndrome.  This has been an intermittent issue for years.  It is reassuring that symptoms have began to improve over the last couple of days.  He has had extensive imaging over the last 1 to 2 years including multiple right upper quadrant ultrasounds to monitor his hepatic steatosis as well as an abdominal MRI about 18 months ago.  There is no obvious lesions or abnormalities with liver, gallbladder or pancreas on any of these imaging tests.  Additionally symptoms do get worse with activity and motion-likely represent musculoskeletal etiology.  Do not think we need to obtain any additional imaging at this point. We did discuss trial of prednisone and physical therapy however he deferred for now given that symptoms are improving.  He will let us know if symptoms worsen.  Would consider referral to PT or sports medicine at that time.  Chronic Problems Addressed Today: Hypertension associated with diabetes (HCC) Blood pressure at goal on Coreg 6.25 mg twice daily and HCTZ 25 mg daily.  Ulnar neuropathy of both upper extremities Follows with orthopedics.  Has upcoming surgery.   Controlled type 2 diabetes mellitus without complication, without long-term current use of insulin (HCC) Last A1c well-controlled 6.5 without medications.  Will recheck A1c when he comes back in for CPE in a few months.     Subjective:  HPI:  See A/P for status of chronic conditions.  Patient is here with right upper quadrant abdominal pain.  This has been happening intermittently for years. Recently flared up about a month ago. He has been doing more yard work recently and doing heavy lifting. He has tried taking gabapentin and tylenol at baseline and thinks that this may help.  He does note  that symptoms usually flareup in the spring and summer when he is more active with yard work.  Has been recurrent for years but recently seems to be the worst he has had.  Does seem to be improving for the last several days.  He does note that pain gets much worse with certain motions such as twisting and lifting heavy objects.  No nausea or vomiting.        Objective:  Physical Exam: BP 124/69   Pulse (!) 55   Temp 98 F (36.7 C) (Temporal)   Ht 6' (1.829 m)   Wt 204 lb 12.8 oz (92.9 kg)   SpO2 96%   BMI 27.78 kg/m   Gen: No acute distress, resting comfortably CV: Regular rate and rhythm with no murmurs appreciated Pulm: Normal work of breathing, clear to auscultation bilaterally with no crackles, wheezes, or rhonchi GI: Bowel sounds present, soft, nontender, nondistended.  Murphy sign negative.  Right lower ribs nonpainful to palpation. Neuro: Grossly normal, moves all extremities Psych: Normal affect and thought content      Timothy Dickson M. Jimmey Ralph, MD 12/06/2022 2:23 PM

## 2022-12-06 NOTE — Patient Instructions (Addendum)
It was very nice to see you today!  You have inflammation in your lower ribs.  Your recent ultrasound and MRI were all reassuring without any other obvious causes.  No masses or tumors.  No fractures.  Please let us know if your symptoms worsen we can try prednisone and physical therapy.  Return if symptoms worsen or fail to improve.   Take care, Dr Jimmey Ralph  PLEASE NOTE:  If you had any lab tests, please let us know if you have not heard back within a few days. You may see your results on mychart before we have a chance to review them but we will give you a call once they are reviewed by [place].   If we ordered any referrals today, please let us know if you have not heard from their office within the next week.   If you had any urgent prescriptions sent in today, please check with the pharmacy within an hour of our visit to make sure the prescription was transmitted appropriately.   Please try these tips to maintain a healthy lifestyle:  Eat at least 3 REAL meals and 1-2 snacks per day.  Aim for no more than 5 hours between eating.  If you eat breakfast, please do so within one hour of getting up.   Each meal should contain half fruits/vegetables, one quarter protein, and one quarter carbs (no bigger than a computer mouse)  Cut down on sweet beverages. This includes juice, soda, and sweet tea.   Drink at least 1 glass of water with each meal and aim for at least 8 glasses per day  Exercise at least 150 minutes every week.

## 2022-12-06 NOTE — Assessment & Plan Note (Signed)
Last A1c well-controlled 6.5 without medications.  Will recheck A1c when he comes back in for CPE in a few months.

## 2022-12-07 ENCOUNTER — Ambulatory Visit (INDEPENDENT_AMBULATORY_CARE_PROVIDER_SITE_OTHER): Payer: Medicare Other

## 2022-12-07 DIAGNOSIS — I4819 Other persistent atrial fibrillation: Secondary | ICD-10-CM | POA: Diagnosis not present

## 2022-12-08 LAB — CUP PACEART REMOTE DEVICE CHECK
Date Time Interrogation Session: 20240508230407
Implantable Pulse Generator Implant Date: 20210819

## 2022-12-13 NOTE — Progress Notes (Signed)
Carelink Summary Report / Loop Recorder 

## 2022-12-20 ENCOUNTER — Encounter: Payer: Self-pay | Admitting: Family Medicine

## 2022-12-21 NOTE — Telephone Encounter (Signed)
Ok to order chest xray though recommend physical therapy or sports medicine referral if he is still having issues.  Katina Degree. Jimmey Ralph, MD 12/21/2022 11:13 AM

## 2022-12-21 NOTE — Telephone Encounter (Signed)
Please advise 

## 2022-12-22 ENCOUNTER — Encounter: Payer: Self-pay | Admitting: Cardiology

## 2022-12-22 ENCOUNTER — Telehealth: Payer: Self-pay

## 2022-12-22 ENCOUNTER — Other Ambulatory Visit: Payer: Self-pay | Admitting: *Deleted

## 2022-12-22 DIAGNOSIS — R059 Cough, unspecified: Secondary | ICD-10-CM

## 2022-12-22 DIAGNOSIS — R079 Chest pain, unspecified: Secondary | ICD-10-CM

## 2022-12-22 NOTE — Telephone Encounter (Signed)
Pharmacy please advise on holding Eliquis prior to elbow ulnar nerve release scheduled for 12/28/2022. Thank you.

## 2022-12-22 NOTE — Telephone Encounter (Signed)
   Pre-operative Risk Assessment    Patient Name: Timothy Dickson  DOB: 1945/10/15 MRN: [id number]      Request for Surgical Clearance    Procedure:   Left elbow ulnar nerve release and /or transposition  Date of Surgery:  Clearance 12/28/22                                 Surgeon:  Dr.Fred Melvyn Novas Surgeon's Group or Practice Name: [facility] Phone number:  [phone] Fax number:  [phone]   Type of Clearance Requested:   - Medical  - Pharmacy:  Hold Apixaban (Eliquis)     Type of Anesthesia:   Regional and IV Sedation    Additional requests/questions:   N/A  [name]   12/22/2022, 4:17 PM

## 2022-12-22 NOTE — Telephone Encounter (Signed)
RE: Referral Received: Today Meriam Sprague, MD  Richelle Ito, RN; Loa Socks, LPN Thank you!!

## 2022-12-23 ENCOUNTER — Encounter: Payer: Self-pay | Admitting: Family Medicine

## 2022-12-23 NOTE — Telephone Encounter (Signed)
Left message to return call to our office at their convenience if any question  X-Ray order placed on 12/21/2021

## 2022-12-23 NOTE — Telephone Encounter (Signed)
Patient with diagnosis of afib on Eliquis for anticoagulation.    Procedure: Left elbow ulnar nerve release and /or transposition  Date of procedure: 12/28/22  CHA2DS2-VASc Score = 5  This indicates a 7.2% annual risk of stroke. The patient's score is based upon: CHF History: 0 HTN History: 1 Diabetes History: 1 Stroke History: 0 Vascular Disease History: 1 Age Score: 2 Gender Score: 0   CrCl 17mL/min Platelet count 211K  Per office protocol, patient can hold Eliquis for 2 days prior to procedure.    **This guidance is not considered finalized until pre-operative APP has relayed final recommendations.**

## 2022-12-23 NOTE — Telephone Encounter (Signed)
Pt has appt with [name], [facility] 12/27/22; per appt notes 12/22/22: *EKG-c/o chest discomfort/sob with movement-ongoing for years-see mychart 5/23-[place] pt    I will update all parties involved. Procedure may possibly need to be post poned until the pt has been seen by cardiology.

## 2022-12-23 NOTE — Telephone Encounter (Signed)
   Name: Timothy Dickson  DOB: 1946/01/19  MRN: [id number]  Primary Cardiologist: Tobias Alexander, MD   Preoperative team, please contact this patient and set up a phone call appointment for further preoperative risk assessment. Please obtain consent and complete medication review. Thank you for your help.  I confirm that guidance regarding antiplatelet and oral anticoagulation therapy has been completed and, if necessary, noted below.  Per office protocol, patient can hold Eliquis for 2 days prior to procedure.    Napoleon Form, Leodis Rains, NP 12/23/2022, 8:21 AM [facility]

## 2022-12-24 ENCOUNTER — Encounter: Payer: Self-pay | Admitting: Family Medicine

## 2022-12-26 DIAGNOSIS — R079 Chest pain, unspecified: Secondary | ICD-10-CM | POA: Insufficient documentation

## 2022-12-26 DIAGNOSIS — R072 Precordial pain: Secondary | ICD-10-CM | POA: Insufficient documentation

## 2022-12-26 DIAGNOSIS — Z01810 Encounter for preprocedural cardiovascular examination: Secondary | ICD-10-CM | POA: Insufficient documentation
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
## 2022-12-27 ENCOUNTER — Ambulatory Visit: Payer: Medicare Other | Attending: Physician Assistant | Admitting: Physician Assistant

## 2022-12-27 ENCOUNTER — Ambulatory Visit
Admission: RE | Admit: 2022-12-27 | Discharge: 2022-12-27 | Disposition: A | Discharge status: Home / Self Care | Payer: Medicare Other | Source: Ambulatory Visit | Attending: Physician Assistant | Admitting: Physician Assistant

## 2022-12-27 ENCOUNTER — Encounter: Payer: Self-pay | Admitting: Physician Assistant

## 2022-12-27 VITALS — BP 121/60 | HR 62 | Ht 71.0 in | Wt 207.6 lb

## 2022-12-27 DIAGNOSIS — I152 Hypertension secondary to endocrine disorders: Secondary | ICD-10-CM

## 2022-12-27 DIAGNOSIS — I2510 Atherosclerotic heart disease of native coronary artery without angina pectoris: Secondary | ICD-10-CM | POA: Diagnosis not present

## 2022-12-27 DIAGNOSIS — R0781 Pleurodynia: Secondary | ICD-10-CM

## 2022-12-27 DIAGNOSIS — R079 Chest pain, unspecified: Secondary | ICD-10-CM

## 2022-12-27 DIAGNOSIS — I4819 Other persistent atrial fibrillation: Secondary | ICD-10-CM

## 2022-12-27 DIAGNOSIS — E1159 Type 2 diabetes mellitus with other circulatory complications: Secondary | ICD-10-CM

## 2022-12-27 DIAGNOSIS — Z01810 Encounter for preprocedural cardiovascular examination: Secondary | ICD-10-CM

## 2022-12-27 DIAGNOSIS — I7121 Aneurysm of the ascending aorta, without rupture: Secondary | ICD-10-CM

## 2022-12-27 DIAGNOSIS — R072 Precordial pain: Secondary | ICD-10-CM

## 2022-12-27 NOTE — Telephone Encounter (Signed)
LVM Xray order placed on 12/22/2022 Left address and information to patient voice mail

## 2022-12-27 NOTE — Assessment & Plan Note (Signed)
Status post ablation in 2020 and again in 2021.  He has maintained sinus rhythm on flecainide.  Continue Eliquis 5 mg twice daily, flecainide 100 mg twice daily, Coreg 6.25 mg twice daily.

## 2022-12-27 NOTE — Assessment & Plan Note (Addendum)
Mr. Hutchings perioperative risk of a major cardiac event is 0.4% according to the Revised Cardiac Risk Index (RCRI).  Therefore, he is at low risk for perioperative complications.   His functional capacity is good at 4.31 METs according to the Duke Activity Status Index (DASI). Recommendations: According to ACC/AHA guidelines, no further cardiovascular testing needed.  The patient may proceed to surgery at acceptable risk.   Antiplatelet and/or Anticoagulation Recommendations: Eliquis (Apixaban) can be held for 2 days prior to surgery.  Please resume post op when felt to be safe.
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
## 2022-12-27 NOTE — Assessment & Plan Note (Signed)
Mildly elevated CAC score in 2017. Myoview in 2018 was low risk and ETT in 2019 was low risk. As noted, his current R lower chest pain is not cardiac. He does not need further ischemic evaluation.  He is not on antiplatelet therapy as he is on Eliquis.  Continue Repatha 140 mg every 14 days.

## 2022-12-27 NOTE — Patient Instructions (Signed)
Medication Instructions:  Your physician recommends that you continue on your current medications as directed. Please refer to the Current Medication list given to you today.  *If you need a refill on your cardiac medications before your next appointment, please call your pharmacy*   Lab Work: None ordered  If you have labs (blood work) drawn today and your tests are completely normal, you will receive your results only by: MyChart Message (if you have MyChart) OR A paper copy in the mail If you have any lab test that is abnormal or we need to change your treatment, we will call you to review the results.   Testing/Procedures: A chest x-ray takes a picture of the organs and structures inside the chest, including the heart, lungs, and blood vessels. This test can show several things, including, whether the heart is enlarges; whether fluid is building up in the lungs; and whether pacemaker / defibrillator leads are still in place. GO TO [facility] [address] TODAY   Follow-Up: At [facility], you and your health needs are our priority.  As part of our continuing mission to provide you with exceptional heart care, we have created designated Provider Care Teams.  These Care Teams include your primary Cardiologist (physician) and Advanced Practice Providers (APPs -  Physician Assistants and Nurse Practitioners) who all work together to provide you with the care you need, when you need it.  We recommend signing up for the patient portal called "MyChart".  Sign up information is provided on this After Visit Summary.  MyChart is used to connect with patients for Virtual Visits (Telemedicine).  Patients are able to view lab/test results, encounter notes, upcoming appointments, etc.  Non-urgent messages can be sent to your provider as well.   To learn more about what you can do with MyChart, go to [email or web address].    Your next appointment:   4 month(s)  Provider:    [name], PA-C         Other Instructions

## 2022-12-27 NOTE — Assessment & Plan Note (Signed)
4.3 cm on recent MRI in September 2023.  He has annual surveillance.

## 2022-12-27 NOTE — Assessment & Plan Note (Signed)
Blood pressure is well-controlled.  Continue Coreg 6.25 mg twice daily, HCTZ 25 mg daily.

## 2022-12-28 DIAGNOSIS — G8918 Other acute postprocedural pain: Secondary | ICD-10-CM | POA: Diagnosis not present

## 2022-12-28 DIAGNOSIS — G5622 Lesion of ulnar nerve, left upper limb: Secondary | ICD-10-CM | POA: Diagnosis not present

## 2022-12-28 NOTE — Progress Notes (Signed)
Carelink Summary Report / Loop Recorder 

## 2022-12-28 NOTE — Telephone Encounter (Signed)
Notes faxed to surgeon. [name], PA-C    12/28/2022 5:57 PM

## 2022-12-29 ENCOUNTER — Telehealth: Payer: Self-pay | Admitting: *Deleted

## 2022-12-29 DIAGNOSIS — R079 Chest pain, unspecified: Secondary | ICD-10-CM

## 2022-12-29 DIAGNOSIS — R1011 Right upper quadrant pain: Secondary | ICD-10-CM

## 2022-12-29 NOTE — Telephone Encounter (Signed)
-----   Message from Beatrice Lecher, [place] sent at 12/29/2022  8:28 AM EDT ----- No fracture on CXR. PLAN:  -Please arrange chest/abd/pelvis CT with and without contrast (Dx: right chest pain, right upper quadrant pain) [name], PA-C    12/29/2022 8:26 AM

## 2023-01-06 ENCOUNTER — Encounter: Payer: Self-pay | Admitting: Family Medicine

## 2023-01-06 ENCOUNTER — Ambulatory Visit (INDEPENDENT_AMBULATORY_CARE_PROVIDER_SITE_OTHER)
Admission: RE | Admit: 2023-01-06 | Discharge: 2023-01-06 | Disposition: A | Discharge status: Home / Self Care | Payer: Medicare Other | Source: Ambulatory Visit | Attending: Family Medicine | Admitting: Family Medicine

## 2023-01-06 ENCOUNTER — Ambulatory Visit (INDEPENDENT_AMBULATORY_CARE_PROVIDER_SITE_OTHER): Payer: Medicare Other | Admitting: Family Medicine

## 2023-01-06 VITALS — BP 135/76 | HR 60 | Temp 97.7°F | Ht 71.0 in | Wt 207.0 lb

## 2023-01-06 DIAGNOSIS — R0781 Pleurodynia: Secondary | ICD-10-CM | POA: Diagnosis not present

## 2023-01-06 DIAGNOSIS — G8929 Other chronic pain: Secondary | ICD-10-CM | POA: Diagnosis not present

## 2023-01-06 DIAGNOSIS — R059 Cough, unspecified: Secondary | ICD-10-CM

## 2023-01-06 DIAGNOSIS — M549 Dorsalgia, unspecified: Secondary | ICD-10-CM | POA: Diagnosis not present

## 2023-01-06 DIAGNOSIS — E1159 Type 2 diabetes mellitus with other circulatory complications: Secondary | ICD-10-CM

## 2023-01-06 DIAGNOSIS — I152 Hypertension secondary to endocrine disorders: Secondary | ICD-10-CM

## 2023-01-06 DIAGNOSIS — R079 Chest pain, unspecified: Secondary | ICD-10-CM

## 2023-01-06 DIAGNOSIS — M545 Low back pain, unspecified: Secondary | ICD-10-CM | POA: Diagnosis not present

## 2023-01-06 MED ORDER — PREDNISONE 50 MG PO TABS: ORAL_TABLET | ORAL | 0 refills | Status: DC

## 2023-01-06 MED ORDER — BACLOFEN 10 MG PO TABS
10.0000 mg | ORAL_TABLET | Freq: Three times a day (TID) | ORAL | 0 refills | Status: DC
Start: 2023-01-06 — End: 2023-01-12

## 2023-01-06 MED ORDER — METHYLPREDNISOLONE ACETATE 80 MG/ML IJ SUSP
80.0000 mg | Freq: Once | INTRAMUSCULAR | Status: AC
Start: 2023-01-06 — End: 2023-01-06
  Administered 2023-01-06: 80 mg via INTRAMUSCULAR

## 2023-01-06 MED ORDER — HYDROCODONE-ACETAMINOPHEN 5-325 MG PO TABS: 1.0000 | ORAL_TABLET | Freq: Three times a day (TID) | ORAL | 0 refills | Status: AC

## 2023-01-06 NOTE — Assessment & Plan Note (Signed)
Blood pressure at goal on Coreg 6.25 mg twice daily and HCTZ 25 mg daily. 

## 2023-01-06 NOTE — Progress Notes (Signed)
Timothy Dickson is a 77 y.o. male who presents today for an office visit.  Assessment/Plan:  New/Acute Problems: Right Rib Pain He has had workup via cardiology and GI over the last 1 to 2 years which was reassuring.  We did see him about a month ago for this however his symptoms have worsened significantly since yesterday after he had a flareup of his chronic low back pain.  It is possible that his symptoms could be result of thoracic radiculitis.  He is tender on exam today but no obvious deformities.  His neurologic exam is reassuring without any focal deficits.  Will be treating his chronic low back pain flareup as below which should hopefully help some with his potential thoracic radiculitis symptoms as well.  Will give very small supply of hydrocodone to use as needed.  We discussed potential side effects of this.  We will check thoracic spine x-ray today.  He is now agreeable to see sports medicine.  Will refer today.  We discussed reasons to return to care and seek emergent care.  Chronic Problems Addressed Today: Chronic low back pain Patient with recent flareup yesterday.  Has known history of degenerative disease and lumbar spine.  No obvious injury precipitating events for his current flare.  Overall reassuring exam today however given his significant worsening since yesterday and lack of recent imaging it would be reasonable to check films today.  Will place order.  Need to avoid NSAIDs due to being anticoagulated on Eliquis.  We will give 80 mg of Depo-Medrol today and start prednisone burst 50 mg daily for 5 days.  Also start baclofen 10 mg 3 times daily as needed for muscle spasms.  He can use heating pad.  He is now agreeable to see sports medicine and we will refer today.  Given otherwise reassuring exam today do not think we need to do anything emergently however we did discuss reasons to return to care and seek emergent care before he can follow-up with sports  medicine.  Hypertension associated with diabetes (HCC) Blood pressure at goal on Coreg 6.25 mg twice daily and HCTZ 25 mg daily.     Subjective:  HPI:  See A/P for status of chronic conditions.  Patient is here today for follow-up.  He was last seen about a month ago.  At our last visit we discussed his right lower rib pain.  At that time pain has been intermittent for the last 1 to 2 years.  We did discuss trial of prednisone and physical therapy at home however he declined.  A few weeks after our visit, he had reached out via MyChart about obtaining an xray.  This was ordered however he ultimately followed up with cardiology to rule out cardiac issues.  He ended up seeing them and had x-ray performed.  They did not feel like his symptoms were cardiac in etiology.  He does have a CT scan planned for next week.  Symptoms were relatively stable until yesterday when he had sudden worsening right rib pain and low back pain.  Does have a history of recurrent low back pain.  Yesterday, feels like his back "went out".  No obvious injuries or precipitating events.  Now has a significant amount of pain in lower back near the top of his buttocks.  He has also had significant worsening of his right lower rib pain as well.   No weakness or numbness.  No tingling. No urinary retention or incontinence.  No treatments  tried.  He is avoiding NSAIDs due to being anticoagulated on Eliquis.        Objective:  Physical Exam: BP 135/76   Pulse 60   Temp 97.7 F (36.5 C) (Temporal)   Ht 5\' 11"  (1.803 m)   Wt 207 lb (93.9 kg)   SpO2 98%   BMI 28.87 kg/m   Gen: No acute distress, resting comfortably CV: Regular rate and rhythm with no murmurs appreciated Pulm: Normal work of breathing, clear to auscultation bilaterally with no crackles, wheezes, or rhonchi MUSCULOSKELETAL - Back: No deformities.  Tenderness palpation along lower thoracic and lumbar paraspinal muscles.  No significant midline tenderness -  Legs: No deformities.  Full range of motion throughout.  Sensation light touch intact throughout. - rib: Tenderness palpation along right lower rib wall.  No obvious deformities noted. Neuro: Cranial nerves II through XII intact.  Strength 5 out of 5 in upper and lower extremities.  Sensation to light touch intact throughout. Psych: Normal affect and thought content      Timothy Dickson M. Jimmey Ralph, MD 01/06/2023 3:30 PM

## 2023-01-06 NOTE — Assessment & Plan Note (Addendum)
Patient with recent flareup yesterday.  Has known history of degenerative disease and lumbar spine.  No obvious injury precipitating events for his current flare.  Overall reassuring exam today however given his significant worsening since yesterday and lack of recent imaging it would be reasonable to check films today.  Will place order.  Need to avoid NSAIDs due to being anticoagulated on Eliquis.  We will give 80 mg of Depo-Medrol today and start prednisone burst 50 mg daily for 5 days.  Also start baclofen 10 mg 3 times daily as needed for muscle spasms.  He can use heating pad.  He is now agreeable to see sports medicine and we will refer today.  Given otherwise reassuring exam today do not think we need to do anything emergently however we did discuss reasons to return to care and seek emergent care before he can follow-up with sports medicine.

## 2023-01-06 NOTE — Patient Instructions (Signed)
It was very nice to see you today!  I think you may have a pinched nerve in your back.  Please start the prednisone.  Will give you a steroid shot today.  Start muscle relaxer as well  Use the pain medication if needed for severe pain.  Please go to [facility] to get your x-rays done.  You can do this this afternoon  I will refer you to sports medicine for further evaluation.  Return if symptoms worsen or fail to improve.   Take care, Dr Jimmey Ralph  PLEASE NOTE:  If you had any lab tests, please let us know if you have not heard back within a few days. You may see your results on mychart before we have a chance to review them but we will give you a call once they are reviewed by [place].   If we ordered any referrals today, please let us know if you have not heard from their office within the next week.   If you had any urgent prescriptions sent in today, please check with the pharmacy within an hour of our visit to make sure the prescription was transmitted appropriately.   Please try these tips to maintain a healthy lifestyle:  Eat at least 3 REAL meals and 1-2 snacks per day.  Aim for no more than 5 hours between eating.  If you eat breakfast, please do so within one hour of getting up.   Each meal should contain half fruits/vegetables, one quarter protein, and one quarter carbs (no bigger than a computer mouse)  Cut down on sweet beverages. This includes juice, soda, and sweet tea.   Drink at least 1 glass of water with each meal and aim for at least 8 glasses per day  Exercise at least 150 minutes every week.

## 2023-01-09 ENCOUNTER — Ambulatory Visit (INDEPENDENT_AMBULATORY_CARE_PROVIDER_SITE_OTHER): Payer: Medicare Other

## 2023-01-09 DIAGNOSIS — I4819 Other persistent atrial fibrillation: Secondary | ICD-10-CM

## 2023-01-09 LAB — CUP PACEART REMOTE DEVICE CHECK
Date Time Interrogation Session: 20240609230016
Implantable Pulse Generator Implant Date: 20210819

## 2023-01-10 DIAGNOSIS — M25622 Stiffness of left elbow, not elsewhere classified: Secondary | ICD-10-CM | POA: Diagnosis not present

## 2023-01-11 NOTE — Progress Notes (Addendum)
Timothy Dickson D.[facility] [address] [place] [address] Phone: [phone]   Assessment and Plan:    1. Chronic bilateral thoracic back pain 2. Chronic bilateral low back pain without sciatica 3. Rib pain on right side 4. Somatic dysfunction of thoracic region 5. Somatic dysfunction of lumbar region 6. Somatic dysfunction of pelvic region  -Chronic with exacerbation, initial sports medicine visit - Unclear etiology of patient's chronic multiple musculoskeletal complaints which include intermittent upper back pain, intermittent lower back pain, and persistent right-sided rib cage pain which have been present for years with intermittent severe flares - Reviewed patient's x-rays with patient in room.  X-rays do not have official radiology read, but I reviewed imaging with patient.  My interpretation: No acute fracture or dislocation or vertebral collapse.  Lateral lumbar vertebral spurring.  Anterior vertebral spurring at thoracolumbar junction.  Facet arthropathy most prominent at L4-L5 and L5-S1.  Degenerative changes of the upper thoracic spine - Discussed that we do not recommend prolonged NSAIDs or prednisone due to chronic anticoagulation for atrial fibrillation with use of Eliquis - Start HEP for core and low back - Start physical therapy for core and low back - Continue Tylenol 650 mg 3 times a day.  Patient was previously instructed to not use more than 1950 mg of Tylenol in 1 day due to history of hep C status posttreatment - May continue gabapentin as needed for pain relief and neurologic pain control - Patient elected for initial OMT today.  Patient did have a flare of pain with lumbar rotation, but otherwise tolerated well per note below. - Decision today to treat with OMT was based on Physical Exam  After verbal consent patient was treated with HVLA (high velocity low amplitude), ME (muscle energy), FPR (flex positional release), ST  (soft tissue), PC/PD (Pelvic Compression/ Pelvic Decompression) techniques in  thoracic, lumbar, and pelvic areas. Patient tolerated the procedure well with improvement in symptoms.  Patient educated on potential side effects of soreness and recommended to rest, hydrate, and use Tylenol as needed for pain control.     Pertinent previous records reviewed include lumbar x-ray 01/06/2023, thoracic x-ray 01/06/2023, chest x-ray 12/27/2022   Follow Up: 3 to 4 weeks for reevaluation.  Could consider repeat OMT if patient received benefit from treatment.  Would review CT scan of chest abdomen pelvis to see if that shows any explanation for patient's ongoing pain.  If not could consider more advanced imaging of thoracic versus lumbar spine   Subjective:    I, Timothy Dickson, am serving as a scribe for Dr. Aleen Dickson.  Chief Complaint: low back pain   HPI:   01/12/2023 Patient is a 77 year old male complaining of low back pain. Patient states that he has three areas that bother him, mostly the mid back where he has a lot of arthritis, low back is not as bad, and the neck where he has some arthritis. Neck the pain is not all the time is more so every once and awhile. Patient states that the are that is really bothering him tho is his ribs on his right side that radiates to the back. Patient had first noticed this pain a couple years ago during yoga but when he did a twist and he had a pulling sensation. The pain did go away but has come back to where the pain hit so hard that he yelled out. Patient is not aware of the position that makes  the pain worse but has noticed the pain when he is taking a deep breath. Patient would like to know what the x rays show that he had done Friday. No one called him. Patient does have a CT scan scheduled for next Tuesday.  Patient tried hydrocodone and was given a dose of prednisone and a depo injection.  Relevant Historical Information: Persistent atrial fibrillation, history of  hep C status post treatment, DM type II  Additional pertinent review of systems negative.   Current Outpatient Medications:    acetaminophen (TYLENOL) 650 MG CR tablet, Take 650 mg by mouth See admin instructions. Take 650 mg at 11 am and take 650 mg at 4-5 am (take with gabapentin), Disp: , Rfl:    allopurinol (ZYLOPRIM) 100 MG tablet, TAKE ONE TABLET BY MOUTH DAILY, Disp: 90 tablet, Rfl: 3   Aloe-Sodium Chloride (AYR SALINE NASAL GEL NA), Place 1 application into the nose at bedtime., Disp: , Rfl:    apixaban (ELIQUIS) 5 MG TABS tablet, Take 1 tablet (5 mg total) by mouth 2 (two) times daily., Disp: 60 tablet, Rfl: 11   b complex vitamins capsule, Take 1 capsule by mouth daily., Disp: , Rfl:    carvedilol (COREG) 6.25 MG tablet, Take 1 tablet (6.25 mg total) by mouth 2 (two) times daily., Disp: 180 tablet, Rfl: 3   Evolocumab (REPATHA SURECLICK) 140 MG/ML SOAJ, INJECT CONTENTS OF 1 PEN SUBCUTANEOUSLY EVERY 14 DAYS., Disp: 6 mL, Rfl: 3   famotidine (PEPCID) 20 MG tablet, Take 20 mg by mouth daily as needed for heartburn. , Disp: , Rfl:    flecainide (TAMBOCOR) 100 MG tablet, Take 1 tablet (100 mg total) by mouth 2 (two) times daily., Disp: 180 tablet, Rfl: 3   gabapentin (NEURONTIN) 800 MG tablet, TAKE ONE TABLET BY MOUTH TWICE DAILY, Disp: 180 tablet, Rfl: 1   hydrochlorothiazide (HYDRODIURIL) 25 MG tablet, Take 1 tablet (25 mg total) by mouth daily., Disp: 90 tablet, Rfl: 0   omeprazole (PRILOSEC) 20 MG capsule, Take 20 mg by mouth every other day. , Disp: , Rfl:    sodium chloride (OCEAN) 0.65 % SOLN nasal spray, Place 1 spray into both nostrils daily., Disp: , Rfl:    Objective:     Vitals:   01/12/23 1417  BP: 126/60  Pulse: 67  SpO2: 98%  Weight: 203 lb (92.1 kg)  Height: 5\' 11"  (1.803 m)      Body mass index is 28.31 kg/m.    Physical Exam:    Gen: Appears well, nad, nontoxic and pleasant Psych: Alert and oriented, appropriate mood and affect Neuro: sensation intact,  strength is 5/5 in upper and lower extremities, muscle tone wnl Skin: no susupicious lesions or rashes  Back - Normal skin, Spine with normal alignment and no deformity.   No tenderness to vertebral process palpation.   Lumbar paraspinous muscles are   tender and without spasm NTTP gluteal musculature    OMT Physical Exam:  ASIS Compression Test: Positive Right   Rib: Bilateral elevated first rib with TTP Thoracic: TTP paraspinal, worse on right compared to left.  Decreased thoracic motion Lumbar: TTP paraspinal, pain flared from right-sided lumbar rotation Pelvis: Right anterior innominate    Electronically signed by:  Timothy Dickson D.[facility] 3:03 PM 01/12/23

## 2023-01-12 ENCOUNTER — Ambulatory Visit: Payer: Medicare Other | Admitting: Sports Medicine

## 2023-01-12 ENCOUNTER — Encounter: Payer: Self-pay | Admitting: Family Medicine

## 2023-01-12 VITALS — BP 126/60 | HR 67 | Ht 71.0 in | Wt 203.0 lb

## 2023-01-12 DIAGNOSIS — M9902 Segmental and somatic dysfunction of thoracic region: Secondary | ICD-10-CM | POA: Diagnosis not present

## 2023-01-12 DIAGNOSIS — M546 Pain in thoracic spine: Secondary | ICD-10-CM | POA: Diagnosis not present

## 2023-01-12 DIAGNOSIS — R0781 Pleurodynia: Secondary | ICD-10-CM | POA: Diagnosis not present

## 2023-01-12 DIAGNOSIS — M545 Low back pain, unspecified: Secondary | ICD-10-CM

## 2023-01-12 DIAGNOSIS — M9903 Segmental and somatic dysfunction of lumbar region: Secondary | ICD-10-CM | POA: Diagnosis not present

## 2023-01-12 DIAGNOSIS — M9905 Segmental and somatic dysfunction of pelvic region: Secondary | ICD-10-CM

## 2023-01-12 DIAGNOSIS — G8929 Other chronic pain: Secondary | ICD-10-CM

## 2023-01-12 NOTE — Telephone Encounter (Signed)
Please advise 

## 2023-01-12 NOTE — Telephone Encounter (Signed)
We still have not gotten the report from radiology.  I was able to pull up the images.  No signs of any fractures however he does have quite a bit of degenerative changes as well as slight scoliosis.  We should be getting the radiology report soon and those will be available in MyChart.  He should let us know if pain is not improving.  Katina Degree. Jimmey Ralph, MD 01/12/2023 12:59 PM

## 2023-01-12 NOTE — Patient Instructions (Signed)
Good to see you  Physical therapy referral given to go any where local  Core exercises give Tylenol 650 mg 3 times a day  Follow up in 3-4 weeks

## 2023-01-15 ENCOUNTER — Encounter: Payer: Self-pay | Admitting: Cardiology

## 2023-01-16 ENCOUNTER — Telehealth: Payer: Self-pay | Admitting: Pharmacist

## 2023-01-16 ENCOUNTER — Telehealth: Payer: Self-pay

## 2023-01-16 DIAGNOSIS — E1169 Type 2 diabetes mellitus with other specified complication: Secondary | ICD-10-CM

## 2023-01-16 NOTE — Progress Notes (Signed)
Radiology reviewed his xray. They confirmed what we and Dr Jean Rosenthal had seen. Has multiple level degenerative changes.  HE needs to follow up here or with sports medicine if not improving.   Katina Degree. Jimmey Ralph, MD 01/16/2023 7:17 AM

## 2023-01-16 NOTE — Telephone Encounter (Signed)
See results note. 

## 2023-01-16 NOTE — Telephone Encounter (Signed)
PA sent to plan, please see separate encounter for updates on determination. (I will route you back in once a decision has been made)  Mouna Yager, CPhT Pharmacy Patient Advocate Specialist Direct Number: [phone] Fax: [phone]  

## 2023-01-16 NOTE — Telephone Encounter (Signed)
Pharmacy Patient Advocate Encounter   Received notification from [facility] MEDICARE that prior authorization for REPATHA 140 MG/ML INJ is needed.    PA submitted on 01/16/23 Key [id number] Status is pending  Haze Rushing, CPhT Pharmacy Patient Advocate Specialist Direct Number: [phone] Fax: [phone]

## 2023-01-16 NOTE — Telephone Encounter (Signed)
Repatha PA renewal request

## 2023-01-17 ENCOUNTER — Ambulatory Visit (HOSPITAL_BASED_OUTPATIENT_CLINIC_OR_DEPARTMENT_OTHER)
Admission: RE | Admit: 2023-01-17 | Discharge: 2023-01-17 | Disposition: A | Discharge status: Home / Self Care | Payer: Medicare Other | Source: Ambulatory Visit | Attending: Physician Assistant | Admitting: Physician Assistant

## 2023-01-17 DIAGNOSIS — R1011 Right upper quadrant pain: Secondary | ICD-10-CM | POA: Insufficient documentation

## 2023-01-17 DIAGNOSIS — R079 Chest pain, unspecified: Secondary | ICD-10-CM | POA: Diagnosis not present

## 2023-01-17 DIAGNOSIS — K5730 Diverticulosis of large intestine without perforation or abscess without bleeding: Secondary | ICD-10-CM | POA: Diagnosis not present

## 2023-01-17 DIAGNOSIS — N281 Cyst of kidney, acquired: Secondary | ICD-10-CM | POA: Diagnosis not present

## 2023-01-17 LAB — POCT I-STAT CREATININE: Creatinine, Ser: 1.4 mg/dL — ABNORMAL HIGH (ref 0.61–1.24)

## 2023-01-17 MED ORDER — REPATHA SURECLICK 140 MG/ML ~~LOC~~ SOAJ
SUBCUTANEOUS | 3 refills | Status: DC
Start: 2023-01-17 — End: 2023-11-03
  Filled 2023-07-20 – 2023-08-09 (×2): qty 6, 84d supply, fill #0

## 2023-01-17 MED ORDER — IOHEXOL 300 MG/ML  SOLN
100.0000 mL | Freq: Once | INTRAMUSCULAR | Status: AC | PRN
  Administered 2023-01-17: 80 mL via INTRAVENOUS

## 2023-01-17 NOTE — Telephone Encounter (Signed)
  [name] with [employer], she said, prior Berkley Harvey is approved for full year 01/16/23 to 01/16/24. She said, she will be faxing the approval letter as well

## 2023-01-17 NOTE — Telephone Encounter (Signed)
Noted, pt due for refill as well, new rx has been sent in.

## 2023-01-17 NOTE — Addendum Note (Signed)
Addended by: Burnetta Sabin on: 01/17/2023 08:07 AM   Modules accepted: Orders

## 2023-01-18 ENCOUNTER — Telehealth: Payer: Self-pay | Admitting: *Deleted

## 2023-01-18 DIAGNOSIS — Z79899 Other long term (current) drug therapy: Secondary | ICD-10-CM

## 2023-01-18 NOTE — Telephone Encounter (Signed)
-----   Message from Beatrice Lecher, [place] sent at 01/18/2023  7:58 AM EDT ----- Results sent to Maryann Alar via MyChart. See MyChart comments below. PLAN:  -Repeat BMET 2 weeks  Mr. Krog  Your creatinine (kidney function) is mildly elevated but appears overall stable. I will repeat your lab in 2 weeks to keep an eye on this. [name], PA-C

## 2023-01-22 ENCOUNTER — Encounter: Payer: Self-pay | Admitting: Physician Assistant

## 2023-01-23 ENCOUNTER — Telehealth: Payer: Self-pay

## 2023-01-23 DIAGNOSIS — I7121 Aneurysm of the ascending aorta, without rupture: Secondary | ICD-10-CM

## 2023-01-23 DIAGNOSIS — I2510 Atherosclerotic heart disease of native coronary artery without angina pectoris: Secondary | ICD-10-CM

## 2023-01-23 NOTE — Telephone Encounter (Signed)
-----   Message from Beatrice Lecher, [place] sent at 01/22/2023  9:54 PM EDT ----- Results sent to Timothy Dickson via MyChart. See MyChart comments below. I will send a copy to PCP as FYI PLAN:  -Cancel Chest MRA in 1 year -Order Chest CTA (gated) in 1 year (Dx: RUL lung nodule, thoracic aortic aneurysm)  Timothy Dickson  Your CT does not show anything that would explain your pain. I see that your primary care provider sent you to sports medicine to work up your back as a cause. I think this is the most likely culprit since this CT did not show anything. There is a small 4 mm nodule in the right lung. Your aneurysm size is stable. I will change your imaging study from an MRI to a CT in 1 year. This will help re-evaluate the nodule and the aneurysm.  [name], PA-C

## 2023-01-24 DIAGNOSIS — M25622 Stiffness of left elbow, not elsewhere classified: Secondary | ICD-10-CM | POA: Diagnosis not present

## 2023-01-30 NOTE — Progress Notes (Signed)
Carelink Summary Report / Loop Recorder 

## 2023-01-31 DIAGNOSIS — M25622 Stiffness of left elbow, not elsewhere classified: Secondary | ICD-10-CM | POA: Diagnosis not present

## 2023-02-01 ENCOUNTER — Ambulatory Visit: Payer: Medicare Other | Attending: Internal Medicine

## 2023-02-01 DIAGNOSIS — Z79899 Other long term (current) drug therapy: Secondary | ICD-10-CM

## 2023-02-01 LAB — BASIC METABOLIC PANEL
BUN/Creatinine Ratio: 17 (ref 10–24)
BUN: 18 mg/dL (ref 8–27)
CO2: 27 mmol/L (ref 20–29)
Calcium: 9.6 mg/dL (ref 8.6–10.2)
Chloride: 96 mmol/L (ref 96–106)
Creatinine, Ser: 1.07 mg/dL (ref 0.76–1.27)
Glucose: 125 mg/dL — ABNORMAL HIGH (ref 70–99)
Potassium: 4.1 mmol/L (ref 3.5–5.2)
Sodium: 136 mmol/L (ref 134–144)
eGFR: 71 mL/min/{1.73_m2} (ref >59)

## 2023-02-06 ENCOUNTER — Encounter: Payer: Self-pay | Admitting: Cardiology

## 2023-02-06 ENCOUNTER — Encounter: Payer: Self-pay | Admitting: Interventional Cardiology

## 2023-02-07 NOTE — Progress Notes (Unsigned)
Timothy D[facility]on D.[facility] [address] [place] [address] Phone: [phone]   Assessment and Plan:     There are no diagnoses linked to this encounter.  ***   Pertinent previous records reviewed include ***   Follow Up: ***     Subjective:   I, Timothy Dickson, am serving as a Neurosurgeon for Doctor Richardean Sale  Chief Complaint: low back pain    HPI:    01/12/2023 Patient is a 77 year old male complaining of low back pain. Patient states that he has three areas that bother him, mostly the mid back where he has a lot of arthritis, low back is not as bad, and the neck where he has some arthritis. Neck the pain is not all the time is more so every once and awhile. Patient states that the are that is really bothering him tho is his ribs on his right side that radiates to the back. Patient had first noticed this pain a couple years ago during yoga but when he did a twist and he had a pulling sensation. The pain did go away but has come back to where the pain hit so hard that he yelled out. Patient is not aware of the position that makes the pain worse but has noticed the pain when he is taking a deep breath. Patient would like to know what the x rays show that he had done Friday. No one called him. Patient does have a CT scan scheduled for next Tuesday.   Patient tried hydrocodone and was given a dose of prednisone and a depo injection.  02/08/2023 Patient states   Relevant Historical Information: Persistent atrial fibrillation, history of hep C status post treatment, DM type II  Additional pertinent review of systems negative.   Current Outpatient Medications:    acetaminophen (TYLENOL) 650 MG CR tablet, Take 650 mg by mouth See admin instructions. Take 650 mg at 11 am and take 650 mg at 4-5 am (take with gabapentin), Disp: , Rfl:    allopurinol (ZYLOPRIM) 100 MG tablet, TAKE ONE TABLET BY MOUTH DAILY, Disp: 90 tablet, Rfl: 3    Aloe-Sodium Chloride (AYR SALINE NASAL GEL NA), Place 1 application into the nose at bedtime., Disp: , Rfl:    apixaban (ELIQUIS) 5 MG TABS tablet, Take 1 tablet (5 mg total) by mouth 2 (two) times daily., Disp: 60 tablet, Rfl: 11   b complex vitamins capsule, Take 1 capsule by mouth daily., Disp: , Rfl:    carvedilol (COREG) 6.25 MG tablet, Take 1 tablet (6.25 mg total) by mouth 2 (two) times daily., Disp: 180 tablet, Rfl: 3   Evolocumab (REPATHA SURECLICK) 140 MG/ML SOAJ, INJECT CONTENTS OF 1 PEN SUBCUTANEOUSLY EVERY 14 DAYS., Disp: 6 mL, Rfl: 3   famotidine (PEPCID) 20 MG tablet, Take 20 mg by mouth daily as needed for heartburn. , Disp: , Rfl:    flecainide (TAMBOCOR) 100 MG tablet, Take 1 tablet (100 mg total) by mouth 2 (two) times daily., Disp: 180 tablet, Rfl: 3   gabapentin (NEURONTIN) 800 MG tablet, TAKE ONE TABLET BY MOUTH TWICE DAILY, Disp: 180 tablet, Rfl: 1   hydrochlorothiazide (HYDRODIURIL) 25 MG tablet, Take 1 tablet (25 mg total) by mouth daily., Disp: 90 tablet, Rfl: 0   omeprazole (PRILOSEC) 20 MG capsule, Take 20 mg by mouth every other day. , Disp: , Rfl:    sodium chloride (OCEAN) 0.65 % SOLN nasal spray, Place 1 spray into both  nostrils daily., Disp: , Rfl:    Objective:     There were no vitals filed for this visit.    There is no height or weight on file to calculate BMI.    Physical Exam:    ***   Electronically signed by:  Timothy D[facility]on D.[facility] 1:01 PM 02/07/23

## 2023-02-08 ENCOUNTER — Other Ambulatory Visit: Payer: Self-pay

## 2023-02-08 ENCOUNTER — Encounter: Payer: Self-pay | Admitting: Gastroenterology

## 2023-02-08 ENCOUNTER — Ambulatory Visit: Payer: Medicare Other | Admitting: Sports Medicine

## 2023-02-08 VITALS — HR 87 | Ht 71.0 in | Wt 203.0 lb

## 2023-02-08 DIAGNOSIS — R0781 Pleurodynia: Secondary | ICD-10-CM

## 2023-02-08 DIAGNOSIS — M546 Pain in thoracic spine: Secondary | ICD-10-CM | POA: Diagnosis not present

## 2023-02-08 DIAGNOSIS — K760 Fatty (change of) liver, not elsewhere classified: Secondary | ICD-10-CM

## 2023-02-08 DIAGNOSIS — Z85528 Personal history of other malignant neoplasm of kidney: Secondary | ICD-10-CM | POA: Diagnosis not present

## 2023-02-08 DIAGNOSIS — G8929 Other chronic pain: Secondary | ICD-10-CM

## 2023-02-08 DIAGNOSIS — Z8619 Personal history of other infectious and parasitic diseases: Secondary | ICD-10-CM

## 2023-02-08 NOTE — Telephone Encounter (Signed)
RUQ [place] order in epic. Secure staff message sent to radiology scheduling to contact patient to set up appt.

## 2023-02-08 NOTE — Patient Instructions (Addendum)
MRI abdomen w/o Pt referral  4-6 week follow up

## 2023-02-09 DIAGNOSIS — M6281 Muscle weakness (generalized): Secondary | ICD-10-CM | POA: Diagnosis not present

## 2023-02-10 ENCOUNTER — Encounter: Payer: Self-pay | Admitting: Cardiology

## 2023-02-10 DIAGNOSIS — B1920 Unspecified viral hepatitis C without hepatic coma: Secondary | ICD-10-CM

## 2023-02-12 ENCOUNTER — Other Ambulatory Visit: Payer: Self-pay | Admitting: Family Medicine

## 2023-02-13 ENCOUNTER — Other Ambulatory Visit: Payer: Self-pay

## 2023-02-13 ENCOUNTER — Ambulatory Visit: Payer: Medicare Other

## 2023-02-13 DIAGNOSIS — I4819 Other persistent atrial fibrillation: Secondary | ICD-10-CM | POA: Diagnosis not present

## 2023-02-13 LAB — CUP PACEART REMOTE DEVICE CHECK
Date Time Interrogation Session: 20240712230421
Implantable Pulse Generator Implant Date: 20210819

## 2023-02-15 ENCOUNTER — Ambulatory Visit (HOSPITAL_COMMUNITY)
Admission: RE | Admit: 2023-02-15 | Discharge: 2023-02-15 | Disposition: A | Discharge status: Home / Self Care | Payer: Medicare Other | Source: Ambulatory Visit | Attending: Gastroenterology | Admitting: Gastroenterology

## 2023-02-15 DIAGNOSIS — Z8619 Personal history of other infectious and parasitic diseases: Secondary | ICD-10-CM | POA: Insufficient documentation

## 2023-02-15 DIAGNOSIS — K760 Fatty (change of) liver, not elsewhere classified: Secondary | ICD-10-CM | POA: Insufficient documentation

## 2023-02-15 DIAGNOSIS — R932 Abnormal findings on diagnostic imaging of liver and biliary tract: Secondary | ICD-10-CM | POA: Diagnosis not present

## 2023-02-16 ENCOUNTER — Ambulatory Visit: Payer: Medicare Other | Attending: Internal Medicine | Admitting: Internal Medicine

## 2023-02-16 ENCOUNTER — Encounter: Payer: Self-pay | Admitting: Internal Medicine

## 2023-02-16 VITALS — BP 106/68 | HR 68 | Ht 71.5 in | Wt 203.4 lb

## 2023-02-16 DIAGNOSIS — I480 Paroxysmal atrial fibrillation: Secondary | ICD-10-CM | POA: Diagnosis not present

## 2023-02-16 NOTE — Patient Instructions (Signed)
Medication Instructions:  Your physician recommends that you continue on your current medications as directed. Please refer to the Current Medication list given to you today.  *If you need a refill on your cardiac medications before your next appointment, please call your pharmacy*   Lab Work: None ordered.  If you have labs (blood work) drawn today and your tests are completely normal, you will receive your results only by: MyChart Message (if you have MyChart) OR A paper copy in the mail If you have any lab test that is abnormal or we need to change your treatment, we will call you to review the results.   Testing/Procedures: None ordered.    Follow-Up: At [facility], you and your health needs are our priority.  As part of our continuing mission to provide you with exceptional heart care, we have created designated Provider Care Teams.  These Care Teams include your primary Cardiologist (physician) and Advanced Practice Providers (APPs -  Physician Assistants and Nurse Practitioners) who all work together to provide you with the care you need, when you need it.  We recommend signing up for the patient portal called "MyChart".  Sign up information is provided on this After Visit Summary.  MyChart is used to connect with patients for Virtual Visits (Telemedicine).  Patients are able to view lab/test results, encounter notes, upcoming appointments, etc.  Non-urgent messages can be sent to your provider as well.   To learn more about what you can do with MyChart, go to [email or web address].    Your next appointment:   6 months with Dr [name]

## 2023-02-16 NOTE — Progress Notes (Signed)
HPI  Timothy Dickson returns today for followup. He is a pleasant 77 yo man with persistent atrial fib. He has a h/o HTN, DM, s/p ablation. He had recurrent atrial fib requiring that he go back on flecainide but since then he has been stable. He was seen a couple of months ago with visual changes and workup has been unremarkable. Review of his ILR demonstrates 99.9% NSR. He has not had syncope and denies chest pain or sob. He does feel his atrial fib.     Current Outpatient Medications  Medication Sig Dispense Refill   acetaminophen (TYLENOL) 650 MG CR tablet Take 650 mg by mouth See admin instructions. Take 650 mg at 11 am and take 650 mg at 4-5 am (take with gabapentin)     allopurinol (ZYLOPRIM) 100 MG tablet TAKE ONE TABLET BY MOUTH DAILY 90 tablet 3   Aloe-Sodium Chloride (AYR SALINE NASAL GEL NA) Place 1 application into the nose at bedtime.     apixaban (ELIQUIS) 5 MG TABS tablet Take 1 tablet (5 mg total) by mouth 2 (two) times daily. 60 tablet 11   b complex vitamins capsule Take 1 capsule by mouth daily.     carvedilol (COREG) 6.25 MG tablet Take 1 tablet (6.25 mg total) by mouth 2 (two) times daily. 180 tablet 3   Evolocumab (REPATHA SURECLICK) 140 MG/ML SOAJ INJECT CONTENTS OF 1 PEN SUBCUTANEOUSLY EVERY 14 DAYS. 6 mL 3   famotidine (PEPCID) 20 MG tablet Take 20 mg by mouth daily as needed for heartburn.      flecainide (TAMBOCOR) 100 MG tablet Take 1 tablet (100 mg total) by mouth 2 (two) times daily. 180 tablet 3   gabapentin (NEURONTIN) 800 MG tablet TAKE ONE TABLET BY MOUTH TWICE DAILY 180 tablet 1   hydrochlorothiazide (HYDRODIURIL) 25 MG tablet Take 1 tablet (25 mg total) by mouth daily. 90 tablet 0   omeprazole (PRILOSEC) 20 MG capsule Take 20 mg by mouth every other day.      sodium chloride (OCEAN) 0.65 % SOLN nasal spray Place 1 spray into both nostrils daily.     No current facility-administered medications for this visit.     Past Medical History:  Diagnosis  Date   Ascending aortic aneurysm (HCC) 12/21/2015   MR angio chest: 06/2019. Diameter of 4.2cm. annual imaging.   Chest/Abd/Pelvic CTA 01/22/23: 4 mm RUL nodule, 4.1 cm ascending TAA, aortic atherosclerosis    Atrial fibrillation (HCC) 2019   Atrial tachycardia    documented by holter 2016. Followed by Dr. Eloy End and Dr. Johney Frame.   Cancer of kidney (HCC)    right kidney-removed. no further tx other than surgery [facility].   Cirrhosis (HCC)    [facility] dx, early stage- follow up visits at [facility]   CRI (chronic renal insufficiency)    s/p R nephrectomy in 1999 for malignancy   Diabetes mellitus without complication (HCC)    NIDDM- dx. 3-4 years ago diet control   Diverticulosis of colon    mild   GERD (gastroesophageal reflux disease)    Headache(784.0)    Hepatitis C    s/p therapy [facility] (curative)/with Harvoni   History of BPH    no recent issues   History of substance abuse (HCC)    past history -none in 38 yrs   Hyperglycemia    Hyperlipemia    Hypertension    Osteoarthritis, knee    knees bilaterally    Pneumonia 1975   hx of 35  years ago    ROS:   All systems reviewed and negative except as noted in the HPI.   Past Surgical History:  Procedure Laterality Date   Arthroscopy right knee     may '11 (Dr Charlann Boxer)   Arthroscopy, left knee,     ATRIAL FIBRILLATION ABLATION N/A 10/11/2018   Procedure: ATRIAL FIBRILLATION ABLATION;  Surgeon: Hillis Range, MD;  Location: [facility] INVASIVE CV LAB;  Service: Cardiovascular;  Laterality: N/A;   ATRIAL FIBRILLATION ABLATION N/A 06/19/2020   Procedure: ATRIAL FIBRILLATION ABLATION;  Surgeon: Hillis Range, MD;  Location: [facility] INVASIVE CV LAB;  Service: Cardiovascular;  Laterality: N/A;   CARDIAC ELECTROPHYSIOLOGY MAPPING AND ABLATION     history of liver biopsy  2007   History of Nephrectomy Right 1999   implantable loop recorder placement  03/19/2020    Medtronic Reveal Linq model LNQ 22 ([id number] ) implantable loop recorder implanted by Dr  Johney Frame for evaluation of palpitations and afib management post ablation   KNEE ARTHROSCOPY Left 10/10/2012   Procedure: LEFT KNEE ARTHROSCOPY WITH MENSCIAL DEBRIDEMENT AND CHONDROPLASTY;  Surgeon: Loanne Drilling, MD;  Location: [facility] ORS;  Service: Orthopedics;  Laterality: Left;   left shoulder repair for bone spur  1990's   TEE WITHOUT CARDIOVERSION N/A 10/11/2018   Procedure: TRANSESOPHAGEAL ECHOCARDIOGRAM (TEE);  Surgeon: Jodelle Red, MD;  Location: [facility] ENDOSCOPY;  Service: Cardiovascular;  Laterality: N/A;  ablation to follow at 1030   TOTAL KNEE ARTHROPLASTY Right 08/15/2016   Procedure: RIGHT TOTAL KNEE ARTHROPLASTY, CORTISONE INJECTION OF LEFT KNEE;  Surgeon: Ollen Gross, MD;  Location: [facility] ORS;  Service: Orthopedics;  Laterality: Right;   TOTAL KNEE ARTHROPLASTY Left 09/04/2017   Procedure: LEFT TOTAL KNEE ARTHROPLASTY;  Surgeon: Ollen Gross, MD;  Location: [facility] ORS;  Service: Orthopedics;  Laterality: Left;   Transurethral needle ablation procedure     TUNA procedure       Family History  Problem Relation Age of Onset   Coronary artery disease Mother    CVA Mother    Alcohol abuse Father        variceal hemorrhage   Liver disease Father        alcohol related   Alcohol abuse Sister    Diabetes Sister    Breast cancer Sister    Lung cancer Sister    Esophageal cancer Sister    Cancer Brother        head and neck   Alcohol abuse Other        Strong family Hx   Colon cancer Neg Hx    Pancreatic cancer Neg Hx    Stomach cancer Neg Hx    Rectal cancer Neg Hx      Social History   Socioeconomic History   Marital status: Married    Spouse name: Not on file   Number of children: Not on file   Years of education: Not on file   Highest education level: Master's degree (e.g., MA, MS, MEng, MEd, MSW, MBA)  Occupational History   Not on file  Tobacco Use   Smoking status: Former    Current packs/day: 0.00    Average packs/day: 1 pack/day for 15.0 years (15.0 ttl  pk-yrs)    Types: Cigarettes    Start date: 08/01/1962    Quit date: 08/01/1977    Years since quitting: 45.5   Smokeless tobacco: Never  Vaping Use   Vaping status: Never Used  Substance and Sexual Activity   Alcohol use: No  Comment: past history -none in 38 yrs   Drug use: No    Comment: past hx none in 4 yrs   Sexual activity: Yes    Partners: Female  Other Topics Concern   Not on file  Social History Narrative   [employer] BA, [place] [employer], [place] psych. Married- '84. 2 step-daughters, 4 grandchildren. Work  Retired Buyer, retail  Marriage in good health.   Two story home   Drinks caffeine   Ambidextrous    Social Determinants of Health   Financial Resource Strain: Low Risk  (12/04/2022)   Overall Financial Resource Strain (CARDIA)    Difficulty of Paying Living Expenses: Not hard at all  Food Insecurity: No Food Insecurity (12/04/2022)   Hunger Vital Sign    Worried About Running Out of Food in the Last Year: Never true    Ran Out of Food in the Last Year: Never true  Transportation Needs: Unknown (12/04/2022)   PRAPARE - Administrator, Civil Service (Medical): Not on file    Lack of Transportation (Non-Medical): No  Physical Activity: Not on file  Stress: No Stress Concern Present (12/04/2022)   [employer] of Occupational Health - Occupational Stress Questionnaire    Feeling of Stress : Not at all  Social Connections: Socially Integrated (12/04/2022)   Social Connection and Isolation Panel [NHANES]    Frequency of Communication with Friends and Family: Three times a week    Frequency of Social Gatherings with Friends and Family: More than three times a week    Attends Religious Services: More than 4 times per year    Active Member of [employer] or Organizations: Yes    Attends Engineer, structural: More than 4 times per year    Marital Status: Married  Catering manager Violence: Not on file     BP 106/68   Pulse 68   Ht 5' 11.5"  (1.816 m)   Wt 203 lb 6.4 oz (92.3 kg)   SpO2 96%   BMI 27.97 kg/m   Physical Exam:  Well appearing NAD HEENT: Unremarkable Neck:  No JVD, no thyromegally Lymphatics:  No adenopathy Back:  No CVA tenderness Lungs:  Clear HEART:  Regular rate rhythm, no murmurs, no rubs, no clicks Abd:  soft, positive bowel sounds, no organomegally, no rebound, no guarding Ext:  2 plus pulses, no edema, no cyanosis, no clubbing Skin:  No rashes no nodules Neuro:  CN II through XII intact, motor grossly intact  EKG - nsr with PAC's   Assess/Plan: PAF - he is maintaining NSR and will continue flecainide.  HTN - his bp is well controlled.  Orthostasis - we discussed the possibility of stopping hydrochlorothiazide and trying something else for his bp. He will continue as is but might need to try amlodipine.  Lewayne Bunting MD

## 2023-02-24 NOTE — Progress Notes (Signed)
Carelink Summary Report / Loop Recorder 

## 2023-02-27 NOTE — Telephone Encounter (Signed)
-----   Message from [facility] sent at 09/01/2022  4:14 PM EST ----- Regarding: labs due CBC, c-Met, INR, AFP due this week

## 2023-02-27 NOTE — Telephone Encounter (Signed)
MyChart message to patient to go for labs

## 2023-02-28 ENCOUNTER — Other Ambulatory Visit: Payer: Self-pay | Admitting: Internal Medicine

## 2023-02-28 ENCOUNTER — Other Ambulatory Visit (INDEPENDENT_AMBULATORY_CARE_PROVIDER_SITE_OTHER): Payer: Medicare Other

## 2023-02-28 DIAGNOSIS — K7469 Other cirrhosis of liver: Secondary | ICD-10-CM

## 2023-02-28 DIAGNOSIS — B1920 Unspecified viral hepatitis C without hepatic coma: Secondary | ICD-10-CM | POA: Diagnosis not present

## 2023-02-28 LAB — COMPREHENSIVE METABOLIC PANEL
ALT: 17 U/L (ref 0–53)
AST: 23 U/L (ref 0–37)
Albumin: 4.6 g/dL (ref 3.5–5.2)
Alkaline Phosphatase: 62 U/L (ref 39–117)
BUN: 24 mg/dL — ABNORMAL HIGH (ref 6–23)
CO2: 29 mEq/L (ref 19–32)
Calcium: 9.6 mg/dL (ref 8.4–10.5)
Chloride: 95 mEq/L — ABNORMAL LOW (ref 96–112)
Creatinine, Ser: 1.13 mg/dL (ref 0.40–1.50)
GFR: 62.75 mL/min (ref >60.00)
Glucose, Bld: 195 mg/dL — ABNORMAL HIGH (ref 70–99)
Potassium: 4.1 mEq/L (ref 3.5–5.1)
Sodium: 134 mEq/L — ABNORMAL LOW (ref 135–145)
Total Bilirubin: 0.8 mg/dL (ref 0.2–1.2)
Total Protein: 6.8 g/dL (ref 6.0–8.3)

## 2023-02-28 LAB — CBC WITH DIFFERENTIAL/PLATELET
Basophils Absolute: 0.1 10*3/uL (ref 0.0–0.1)
Basophils Relative: 0.8 % (ref 0.0–3.0)
Eosinophils Absolute: 0.1 10*3/uL (ref 0.0–0.7)
Eosinophils Relative: 1.7 % (ref 0.0–5.0)
HCT: 46.6 % (ref 39.0–52.0)
Hemoglobin: 15.4 g/dL (ref 13.0–17.0)
Lymphocytes Relative: 16.7 % (ref 12.0–46.0)
Lymphs Abs: 1.2 10*3/uL (ref 0.7–4.0)
MCHC: 33.1 g/dL (ref 30.0–36.0)
MCV: 94.1 fl (ref 78.0–100.0)
Monocytes Absolute: 0.4 10*3/uL (ref 0.1–1.0)
Monocytes Relative: 6 % (ref 3.0–12.0)
Neutro Abs: 5.3 10*3/uL (ref 1.4–7.7)
Neutrophils Relative %: 74.8 % (ref 43.0–77.0)
Platelets: 209 10*3/uL (ref 150.0–400.0)
RBC: 4.95 Mil/uL (ref 4.22–5.81)
RDW: 13.6 % (ref 11.5–15.5)
WBC: 7.1 10*3/uL (ref 4.0–10.5)

## 2023-02-28 LAB — PROTIME-INR
INR: 1.4 ratio — ABNORMAL HIGH (ref 0.8–1.0)
Prothrombin Time: 14.8 s — ABNORMAL HIGH (ref 9.6–13.1)

## 2023-03-01 ENCOUNTER — Encounter (INDEPENDENT_AMBULATORY_CARE_PROVIDER_SITE_OTHER): Payer: Self-pay

## 2023-03-06 ENCOUNTER — Other Ambulatory Visit: Payer: Self-pay | Admitting: Oncology

## 2023-03-06 DIAGNOSIS — Z006 Encounter for examination for normal comparison and control in clinical research program: Secondary | ICD-10-CM

## 2023-03-09 ENCOUNTER — Other Ambulatory Visit: Payer: Self-pay | Admitting: Internal Medicine

## 2023-03-09 DIAGNOSIS — I480 Paroxysmal atrial fibrillation: Secondary | ICD-10-CM

## 2023-03-09 NOTE — Telephone Encounter (Signed)
Prescription refill request for Eliquis received. Indication: Afib  Last office visit: 02/16/23 [name])  Scr: 1.13 (02/28/23)  Age: 77 Weight: 92.3kg  Appropriate dose. Refill sent.

## 2023-03-16 NOTE — Progress Notes (Deleted)
Timothy Dickson D.[facility] [address] [place] [address] Phone: [phone]   Assessment and Plan:     There are no diagnoses linked to this encounter.  ***   Pertinent previous records reviewed include ***   Follow Up: ***     Subjective:   I,  , am serving as a Neurosurgeon for Doctor Richardean Sale   Chief Complaint: low back pain    HPI:    01/12/2023 Patient is a 77 year old male complaining of low back pain. Patient states that he has three areas that bother him, mostly the mid back where he has a lot of arthritis, low back is not as bad, and the neck where he has some arthritis. Neck the pain is not all the time is more so every once and awhile. Patient states that the are that is really bothering him tho is his ribs on his right side that radiates to the back. Patient had first noticed this pain a couple years ago during yoga but when he did a twist and he had a pulling sensation. The pain did go away but has come back to where the pain hit so hard that he yelled out. Patient is not aware of the position that makes the pain worse but has noticed the pain when he is taking a deep breath. Patient would like to know what the x rays show that he had done Friday. No one called him. Patient does have a CT scan scheduled for next Tuesday.   Patient tried hydrocodone and was given a dose of prednisone and a depo injection.   02/08/2023 Patient states that he is the same    03/20/2023 Patient states   Relevant Historical Information: Persistent atrial fibrillation, history of hep C status post treatment, DM type II  Additional pertinent review of systems negative.   Current Outpatient Medications:    acetaminophen (TYLENOL) 650 MG CR tablet, Take 650 mg by mouth See admin instructions. Take 650 mg at 11 am and take 650 mg at 4-5 am (take with gabapentin), Disp: , Rfl:    allopurinol (ZYLOPRIM) 100 MG tablet, TAKE ONE TABLET  BY MOUTH DAILY, Disp: 90 tablet, Rfl: 3   Aloe-Sodium Chloride (AYR SALINE NASAL GEL NA), Place 1 application into the nose at bedtime., Disp: , Rfl:    b complex vitamins capsule, Take 1 capsule by mouth daily., Disp: , Rfl:    carvedilol (COREG) 6.25 MG tablet, Take 1 tablet (6.25 mg total) by mouth 2 (two) times daily., Disp: 180 tablet, Rfl: 3   ELIQUIS 5 MG TABS tablet, Take 1 tablet (5 mg total) by mouth 2 (two) times daily., Disp: 60 tablet, Rfl: 11   Evolocumab (REPATHA SURECLICK) 140 MG/ML SOAJ, INJECT CONTENTS OF 1 PEN SUBCUTANEOUSLY EVERY 14 DAYS., Disp: 6 mL, Rfl: 3   famotidine (PEPCID) 20 MG tablet, Take 20 mg by mouth daily as needed for heartburn. , Disp: , Rfl:    flecainide (TAMBOCOR) 100 MG tablet, Take 1 tablet (100 mg total) by mouth 2 (two) times daily., Disp: 180 tablet, Rfl: 3   gabapentin (NEURONTIN) 800 MG tablet, TAKE ONE TABLET BY MOUTH TWICE DAILY, Disp: 180 tablet, Rfl: 1   hydrochlorothiazide (HYDRODIURIL) 25 MG tablet, Take 1 tablet (25 mg total) by mouth daily., Disp: 90 tablet, Rfl: 0   omeprazole (PRILOSEC) 20 MG capsule, Take 20 mg by mouth every other day. , Disp: , Rfl:    sodium  chloride (OCEAN) 0.65 % SOLN nasal spray, Place 1 spray into both nostrils daily., Disp: , Rfl:    Objective:     There were no vitals filed for this visit.    There is no height or weight on file to calculate BMI.    Physical Exam:    ***   Electronically signed by:  Timothy Dickson D.[facility] 12:41 PM 03/16/23

## 2023-03-20 ENCOUNTER — Ambulatory Visit: Payer: Medicare Other | Admitting: Sports Medicine

## 2023-03-20 ENCOUNTER — Ambulatory Visit (INDEPENDENT_AMBULATORY_CARE_PROVIDER_SITE_OTHER): Payer: Medicare Other

## 2023-03-20 DIAGNOSIS — I480 Paroxysmal atrial fibrillation: Secondary | ICD-10-CM

## 2023-03-20 LAB — CUP PACEART REMOTE DEVICE CHECK
Date Time Interrogation Session: 20240818230637
Implantable Pulse Generator Implant Date: 20210819

## 2023-03-22 DIAGNOSIS — M5414 Radiculopathy, thoracic region: Secondary | ICD-10-CM | POA: Diagnosis not present

## 2023-03-22 DIAGNOSIS — Z6828 Body mass index (BMI) 28.0-28.9, adult: Secondary | ICD-10-CM | POA: Diagnosis not present

## 2023-03-30 ENCOUNTER — Ambulatory Visit (INDEPENDENT_AMBULATORY_CARE_PROVIDER_SITE_OTHER): Payer: Medicare Other | Admitting: Family Medicine

## 2023-03-30 VITALS — BP 132/84 | HR 56 | Temp 98.2°F | Ht 71.5 in | Wt 207.0 lb

## 2023-03-30 DIAGNOSIS — Z0000 Encounter for general adult medical examination without abnormal findings: Secondary | ICD-10-CM | POA: Diagnosis not present

## 2023-03-30 DIAGNOSIS — G8929 Other chronic pain: Secondary | ICD-10-CM

## 2023-03-30 DIAGNOSIS — I152 Hypertension secondary to endocrine disorders: Secondary | ICD-10-CM

## 2023-03-30 DIAGNOSIS — E119 Type 2 diabetes mellitus without complications: Secondary | ICD-10-CM | POA: Diagnosis not present

## 2023-03-30 DIAGNOSIS — E785 Hyperlipidemia, unspecified: Secondary | ICD-10-CM | POA: Diagnosis not present

## 2023-03-30 DIAGNOSIS — R351 Nocturia: Secondary | ICD-10-CM | POA: Diagnosis not present

## 2023-03-30 DIAGNOSIS — Z0001 Encounter for general adult medical examination with abnormal findings: Secondary | ICD-10-CM | POA: Diagnosis not present

## 2023-03-30 DIAGNOSIS — M545 Low back pain, unspecified: Secondary | ICD-10-CM

## 2023-03-30 DIAGNOSIS — M1000 Idiopathic gout, unspecified site: Secondary | ICD-10-CM | POA: Diagnosis not present

## 2023-03-30 DIAGNOSIS — E1159 Type 2 diabetes mellitus with other circulatory complications: Secondary | ICD-10-CM | POA: Diagnosis not present

## 2023-03-30 DIAGNOSIS — G629 Polyneuropathy, unspecified: Secondary | ICD-10-CM

## 2023-03-30 DIAGNOSIS — E1169 Type 2 diabetes mellitus with other specified complication: Secondary | ICD-10-CM | POA: Diagnosis not present

## 2023-03-30 NOTE — Assessment & Plan Note (Signed)
132/84 today.  He recently decreased both doses of his HCTZ and Coreg per direction of his cardiologist though they would like to get his blood pressure is back down to the 120s over 80s.  He will go back to full dose of Coreg and follow-up with cardiology soon.

## 2023-03-30 NOTE — Assessment & Plan Note (Signed)
Check lipids.  On Repatha per cardiology

## 2023-03-30 NOTE — Assessment & Plan Note (Signed)
Stable on gabapentin 800mg  twice daily.

## 2023-03-30 NOTE — Assessment & Plan Note (Signed)
No recent flares.  Check uric acid level.  Continue allopurinol 100 mg daily.

## 2023-03-30 NOTE — Assessment & Plan Note (Signed)
Has seen sports medicine for this but is now back with neurosurgery and pain management.  Has upcoming MRI in a few weeks and may be getting epidural steroid injections soon.

## 2023-03-30 NOTE — Assessment & Plan Note (Signed)
Check A1c.  Not currently on any medications. 

## 2023-03-30 NOTE — Progress Notes (Signed)
Carelink Summary Report / Loop Recorder 

## 2023-03-30 NOTE — Progress Notes (Signed)
Chief Complaint:  Timothy Dickson is a 77 y.o. male who presents today for his annual comprehensive physical exam.    Assessment/Plan:  Chronic Problems Addressed Today: Chronic low back pain Has seen sports medicine for this but is now back with neurosurgery and pain management.  Has upcoming MRI in a few weeks and may be getting epidural steroid injections soon.   Hypertension associated with diabetes (HCC) 132/84 today.  He recently decreased both doses of his HCTZ and Coreg per direction of his cardiologist though they would like to get his blood pressure is back down to the 120s over 80s.  He will go back to full dose of Coreg and follow-up with cardiology soon.  Dyslipidemia associated with type 2 diabetes mellitus (HCC) Check lipids.  On Repatha per cardiology  Controlled type 2 diabetes mellitus without complication, without long-term current use of insulin (HCC) Check A1c.  Not currently on any medications.  Gout No recent flares.  Check uric acid level.  Continue allopurinol 100 mg daily.  Neuropathy Stable on gabapentin 800mg  twice daily.    Preventative Healthcare: Will be getting flu and COVID-vaccine in a month or 2.  Check labs today.  Up-to-date on colon cancer screening.  Patient Counseling(The following topics were reviewed and/or handout was given):  -Nutrition: Stressed importance of moderation in sodium/caffeine intake, saturated fat and cholesterol, caloric balance, sufficient intake of fresh fruits, vegetables, and fiber.  -Stressed the importance of regular exercise.   -Substance Abuse: Discussed cessation/primary prevention of tobacco, alcohol, or other drug use; driving or other dangerous activities under the influence; availability of treatment for abuse.   -Injury prevention: Discussed safety belts, safety helmets, smoke detector, smoking near bedding or upholstery.   -Sexuality: Discussed sexually transmitted diseases, partner selection, use of condoms,  avoidance of unintended pregnancy and contraceptive alternatives.   -Dental health: Discussed importance of regular tooth brushing, flossing, and dental visits.  -Health maintenance and immunizations reviewed. Please refer to Health maintenance section.  Return to care in 1 year for next preventative visit.     Subjective:  HPI:  He has no acute complaints today.   Lifestyle Diet: Balanced. Plenty of fruits and vegables.  Exercise: Likes to ride bikes but limited      03/30/2023    2:06 PM  Depression screen PHQ 2/9  Decreased Interest 0  Down, Depressed, Hopeless 0  PHQ - 2 Score 0    Health Maintenance Due  Topic Date Due   COVID-19 Vaccine (9 - 2023-24 season) 07/08/2022   Diabetic kidney evaluation - Urine ACR  03/30/2023   Medicare Annual Wellness (AWV)  03/30/2023   HEMOGLOBIN A1C  03/30/2023     ROS: Per HPI, otherwise a complete review of systems was negative.   PMH:  The following were reviewed and entered/updated in epic: Past Medical History:  Diagnosis Date   Ascending aortic aneurysm (HCC) 12/21/2015   MR angio chest: 06/2019. Diameter of 4.2cm. annual imaging.   Chest/Abd/Pelvic CTA 01/22/23: 4 mm RUL nodule, 4.1 cm ascending TAA, aortic atherosclerosis    Atrial fibrillation (HCC) 2019   Atrial tachycardia    documented by holter 2016. Followed by Dr. Eloy End and Dr. Johney Frame.   Cancer of kidney (HCC)    right kidney-removed. no further tx other than surgery [facility].   Cirrhosis (HCC)    [facility] dx, early stage- follow up visits at [facility].   CRI (chronic renal insufficiency)    s/p R nephrectomy in 1999 for malignancy  Diabetes mellitus without complication (HCC)    NIDDM- dx. 3-4 years ago diet control   Diverticulosis of colon    mild   GERD (gastroesophageal reflux disease)    Headache(784.0)    Hepatitis C    s/p therapy [facility] (curative)/with Harvoni   History of BPH    no recent issues   History of substance abuse (HCC)    past history -none in  38 yrs   Hyperglycemia    Hyperlipemia    Hypertension    Osteoarthritis, knee    knees bilaterally    Pneumonia 1975   hx of 35 years ago   Patient Active Problem List   Diagnosis Date Noted   Coronary artery calcification seen on CT scan 12/27/2022   Chest pain 12/26/2022   Neuropathy 09/28/2021   Controlled type 2 diabetes mellitus without complication, without long-term current use of insulin (HCC) 09/11/2020   Gout 09/11/2020   Persistent atrial fibrillation (HCC) 09/06/2019   Secondary hypercoagulable state (HCC) 09/06/2019   AI (aortic insufficiency) 12/21/2015   Ascending aortic aneurysm (HCC) 12/21/2015   History of renal cell cancer s/p right nephrectomy 1999 04/16/2015   Ulnar neuropathy of both upper extremities 10/13/2014   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) 08/29/2014   Dyslipidemia associated with type 2 diabetes mellitus (HCC) 10/28/2013   Hypertension associated with diabetes (HCC) 10/04/2012   Chronic low back pain 05/22/2012   OA (osteoarthritis) of knee 10/26/2007   Past Surgical History:  Procedure Laterality Date   Arthroscopy right knee     may '11 (Dr Charlann Boxer)   Arthroscopy, left knee,     ATRIAL FIBRILLATION ABLATION N/A 10/11/2018   Procedure: ATRIAL FIBRILLATION ABLATION;  Surgeon: Hillis Range, MD;  Location: [facility];  Service: Cardiovascular;  Laterality: N/A;   ATRIAL FIBRILLATION ABLATION N/A 06/19/2020   Procedure: ATRIAL FIBRILLATION ABLATION;  Surgeon: Hillis Range, MD;  Location: [facility] CV [facility];  Service: Cardiovascular;  Laterality: N/A;   CARDIAC ELECTROPHYSIOLOGY MAPPING AND ABLATION     history of liver biopsy  2007   History of Nephrectomy Right 1999   implantable loop recorder placement  03/19/2020    Medtronic Reveal Linq model LNQ 22 (RLB [id number] ) implantable loop recorder implanted by Dr Johney Frame for evaluation of palpitations and afib management post ablation   KNEE ARTHROSCOPY Left  10/10/2012   Procedure: LEFT KNEE ARTHROSCOPY WITH MENSCIAL DEBRIDEMENT AND CHONDROPLASTY;  Surgeon: Loanne Drilling, MD;  Location: [facility];  Service: Orthopedics;  Laterality: Left;   left shoulder repair for bone spur  1990's   TEE WITHOUT CARDIOVERSION N/A 10/11/2018   Procedure: TRANSESOPHAGEAL ECHOCARDIOGRAM (TEE);  Surgeon: Jodelle Red, MD;  Location: [facility] ENDOSCOPY;  Service: Cardiovascular;  Laterality: N/A;  ablation to follow at 1030   TOTAL KNEE ARTHROPLASTY Right 08/15/2016   Procedure: RIGHT TOTAL KNEE ARTHROPLASTY, CORTISONE INJECTION OF LEFT KNEE;  Surgeon: Ollen Gross, MD;  Location: [facility];  Service: Orthopedics;  Laterality: Right;   TOTAL KNEE ARTHROPLASTY Left 09/04/2017   Procedure: LEFT TOTAL KNEE ARTHROPLASTY;  Surgeon: Ollen Gross, MD;  Location: [facility];  Service: Orthopedics;  Laterality: Left;   Transurethral needle ablation procedure     TUNA procedure      Family History  Problem Relation Age of Onset   Coronary artery disease Mother    CVA Mother    Alcohol abuse Father        variceal hemorrhage   Liver disease Father  alcohol related   Alcohol abuse Sister    Diabetes Sister    Breast cancer Sister    Lung cancer Sister    Esophageal cancer Sister    Cancer Brother        head and neck   Alcohol abuse Other        Strong family Hx   Colon cancer Neg Hx    Pancreatic cancer Neg Hx    Stomach cancer Neg Hx    Rectal cancer Neg Hx     Medications- reviewed and updated Current Outpatient Medications  Medication Sig Dispense Refill   acetaminophen (TYLENOL) 650 MG CR tablet Take 650 mg by mouth See admin instructions. Take 650 mg at 11 am and take 650 mg at 4-5 am (take with gabapentin)     allopurinol (ZYLOPRIM) 100 MG tablet TAKE ONE TABLET BY MOUTH DAILY 90 tablet 3   Aloe-Sodium Chloride (AYR SALINE NASAL GEL NA) Place 1 application into the nose at bedtime.     carvedilol (COREG) 6.25 MG tablet Take 1 tablet (6.25 mg total) by  mouth 2 (two) times daily. 180 tablet 3   ELIQUIS 5 MG TABS tablet Take 1 tablet (5 mg total) by mouth 2 (two) times daily. 60 tablet 11   Evolocumab (REPATHA SURECLICK) 140 MG/ML SOAJ INJECT CONTENTS OF 1 PEN SUBCUTANEOUSLY EVERY 14 DAYS. 6 mL 3   famotidine (PEPCID) 20 MG tablet Take 20 mg by mouth daily as needed for heartburn.      flecainide (TAMBOCOR) 100 MG tablet Take 1 tablet (100 mg total) by mouth 2 (two) times daily. 180 tablet 3   gabapentin (NEURONTIN) 800 MG tablet TAKE ONE TABLET BY MOUTH TWICE DAILY 180 tablet 1   hydrochlorothiazide (HYDRODIURIL) 25 MG tablet Take 1 tablet (25 mg total) by mouth daily. 90 tablet 0   omeprazole (PRILOSEC) 20 MG capsule Take 20 mg by mouth every other day.      sodium chloride (OCEAN) 0.65 % SOLN nasal spray Place 1 spray into both nostrils daily.     No current facility-administered medications for this visit.    Allergies-reviewed and updated Allergies  Allergen Reactions   Tramadol Hcl     Other reaction(s): ineffective   Acetaminophen Other (See Comments)    Pt states he was told by [facility] MD's to keep tylenol dosage to 2000 mg per 24 hours due to cirrhosis of liver.   Nsaids Other (See Comments)    Avoid due to having 1 kidney    Pravastatin Other (See Comments)    Pt reports this med makes him generally feel unwell.    Social History   Socioeconomic History   Marital status: Married    Spouse name: Not on file   Number of children: Not on file   Years of education: Not on file   Highest education level: Master's degree (e.g., MA, MS, MEng, MEd, MSW, MBA)  Occupational History   Not on file  Tobacco Use   Smoking status: Former    Current packs/day: 0.00    Average packs/day: 1 pack/day for 15.0 years (15.0 ttl pk-yrs)    Types: Cigarettes    Start date: 08/01/1962    Quit date: 08/01/1977    Years since quitting: 45.6   Smokeless tobacco: Never  Vaping Use   Vaping status: Never Used  Substance and Sexual Activity    Alcohol use: No    Comment: past history -none in 38 yrs   Drug use: No  Comment: past hx none in 38 yrs   Sexual activity: Yes    Partners: Female  Other Topics Concern   Not on file  Social History Narrative   [place], [place] [employer], [place] psych. Married- '84. 2 step-daughters, 4 grandchildren. Work  Retired Buyer, retail  Marriage in good health.   Two story home   Drinks caffeine   Ambidextrous    Social Determinants of Health   Financial Resource Strain: Low Risk  (12/04/2022)   Overall Financial Resource Strain (CARDIA)    Difficulty of Paying Living Expenses: Not hard at all  Food Insecurity: No Food Insecurity (12/04/2022)   Hunger Vital Sign    Worried About Running Out of Food in the Last Year: Never true    Ran Out of Food in the Last Year: Never true  Transportation Needs: Unknown (12/04/2022)   PRAPARE - Administrator, Civil Service (Medical): Not on file    Lack of Transportation (Non-Medical): No  Physical Activity: Not on file  Stress: No Stress Concern Present (12/04/2022)   [employer] of Occupational Health - Occupational Stress Questionnaire    Feeling of Stress : Not at all  Social Connections: Socially Integrated (12/04/2022)   Social Connection and Isolation Panel [NHANES]    Frequency of Communication with Friends and Family: Three times a week    Frequency of Social Gatherings with Friends and Family: More than three times a week    Attends Religious Services: More than 4 times per year    Active Member of Clubs or Organizations: Yes    Attends Engineer, structural: More than 4 times per year    Marital Status: Married        Objective:  Physical Exam: BP 132/84   Pulse (!) 56   Temp 98.2 F (36.8 C) (Temporal)   Ht 5' 11.5" (1.816 m)   Wt 207 lb (93.9 kg)   SpO2 96%   BMI 28.47 kg/m   Body mass index is 28.47 kg/m. Wt Readings from Last 3 Encounters:  03/30/23 207 lb (93.9 kg)  02/16/23 203 lb 6.4  oz (92.3 kg)  02/08/23 203 lb (92.1 kg)   Gen: NAD, resting comfortably HEENT: TMs normal bilaterally. OP clear. No thyromegaly noted.  CV: RRR with no murmurs appreciated Pulm: NWOB, CTAB with no crackles, wheezes, or rhonchi GI: Normal bowel sounds present. Soft, Nontender, Nondistended. MSK: no edema, cyanosis, or clubbing noted Skin: warm, dry Neuro: CN2-12 grossly intact. Strength 5/5 in upper and lower extremities. Reflexes symmetric and intact bilaterally.  Psych: Normal affect and thought content     Agnieszka Newhouse M. Jimmey Ralph, MD 03/30/2023 2:27 PM

## 2023-03-30 NOTE — Patient Instructions (Addendum)
It was very nice to see you today!  We will check blood work today.  Please continue to work on your diet and exercise.  Will see back in year.  Please come back sooner if needed.  Recently saw  Return in about 1 year (around 03/29/2024) for Annual Physical.   Take care, Dr Jimmey Ralph  PLEASE NOTE:  If you had any lab tests, please let us know if you have not heard back within a few days. You may see your results on mychart before we have a chance to review them but we will give you a call once they are reviewed by [place].   If we ordered any referrals today, please let us know if you have not heard from their office within the next week.   If you had any urgent prescriptions sent in today, please check with the pharmacy within an hour of our visit to make sure the prescription was transmitted appropriately.   Please try these tips to maintain a healthy lifestyle:  Eat at least 3 REAL meals and 1-2 snacks per day.  Aim for no more than 5 hours between eating.  If you eat breakfast, please do so within one hour of getting up.   Each meal should contain half fruits/vegetables, one quarter protein, and one quarter carbs (no bigger than a computer mouse)  Cut down on sweet beverages. This includes juice, soda, and sweet tea.   Drink at least 1 glass of water with each meal and aim for at least 8 glasses per day  Exercise at least 150 minutes every week.    Preventive Care 30 Years and Older, Male Preventive care refers to lifestyle choices and visits with your health care provider that can promote health and wellness. Preventive care visits are also called wellness exams. What can I expect for my preventive care visit? Counseling During your preventive care visit, your health care provider may ask about your: Medical history, including: Past medical problems. Family medical history. History of falls. Current health, including: Emotional well-being. Home life and relationship  well-being. Sexual activity. Memory and ability to understand (cognition). Lifestyle, including: Alcohol, nicotine or tobacco, and drug use. Access to firearms. Diet, exercise, and sleep habits. Work and work Astronomer. Sunscreen use. Safety issues such as seatbelt and bike helmet use. Physical exam Your health care provider will check your: Height and weight. These may be used to calculate your BMI (body mass index). BMI is a measurement that tells if you are at a healthy weight. Waist circumference. This measures the distance around your waistline. This measurement also tells if you are at a healthy weight and may help predict your risk of certain diseases, such as type 2 diabetes and high blood pressure. Heart rate and blood pressure. Body temperature. Skin for abnormal spots. What immunizations do I need?  Vaccines are usually given at various ages, according to a schedule. Your health care provider will recommend vaccines for you based on your age, medical history, and lifestyle or other factors, such as travel or where you work. What tests do I need? Screening Your health care provider may recommend screening tests for certain conditions. This may include: Lipid and cholesterol levels. Diabetes screening. This is done by checking your blood sugar (glucose) after you have not eaten for a while (fasting). Hepatitis C test. Hepatitis B test. HIV (human immunodeficiency virus) test. STI (sexually transmitted infection) testing, if you are at risk. Lung cancer screening. Colorectal cancer screening. Prostate cancer screening.  Abdominal aortic aneurysm (AAA) screening. You may need this if you are a current or former smoker. Talk with your health care provider about your test results, treatment options, and if necessary, the need for more tests. Follow these instructions at home: Eating and drinking  Eat a diet that includes fresh fruits and vegetables, whole grains, lean  protein, and low-fat dairy products. Limit your intake of foods with high amounts of sugar, saturated fats, and salt. Take vitamin and mineral supplements as recommended by your health care provider. Do not drink alcohol if your health care provider tells you not to drink. If you drink alcohol: Limit how much you have to 0-2 drinks a day. Know how much alcohol is in your drink. In the [place] one drink equals one 12 oz bottle of beer (355 mL), one 5 oz glass of wine (148 mL), or one 1 oz glass of hard liquor (44 mL). Lifestyle Brush your teeth every morning and night with fluoride toothpaste. Floss one time each day. Exercise for at least 30 minutes 5 or more days each week. Do not use any products that contain nicotine or tobacco. These products include cigarettes, chewing tobacco, and vaping devices, such as e-cigarettes. If you need help quitting, ask your health care provider. Do not use drugs. If you are sexually active, practice safe sex. Use a condom or other form of protection to prevent STIs. Take aspirin only as told by your health care provider. Make sure that you understand how much to take and what form to take. Work with your health care provider to find out whether it is safe and beneficial for you to take aspirin daily. Ask your health care provider if you need to take a cholesterol-lowering medicine (statin). Find healthy ways to manage stress, such as: Meditation, yoga, or listening to music. Journaling. Talking to a trusted person. Spending time with friends and family. Safety Always wear your seat belt while driving or riding in a vehicle. Do not drive: If you have been drinking alcohol. Do not ride with someone who has been drinking. When you are tired or distracted. While texting. If you have been using any mind-altering substances or drugs. Wear a helmet and other protective equipment during sports activities. If you have firearms in your house, make sure you follow  all gun safety procedures. Minimize exposure to UV radiation to reduce your risk of skin cancer. What's next? Visit your health care provider once a year for an annual wellness visit. Ask your health care provider how often you should have your eyes and teeth checked. Stay up to date on all vaccines. This information is not intended to replace advice given to you by your health care provider. Make sure you discuss any questions you have with your health care provider. Document Revised: 01/13/2021 Document Reviewed: 01/13/2021 Elsevier Patient Education  2024 [employer].

## 2023-03-31 LAB — LIPID PANEL
Cholesterol: 108 mg/dL (ref 0–200)
HDL: 43.9 mg/dL (ref >39.00)
LDL Cholesterol: 38 mg/dL (ref 0–99)
NonHDL: 64.55
Total CHOL/HDL Ratio: 2
Triglycerides: 132 mg/dL (ref 0.0–149.0)
VLDL: 26.4 mg/dL (ref 0.0–40.0)

## 2023-03-31 LAB — PSA: PSA: 0.48 ng/mL (ref 0.10–4.00)

## 2023-03-31 LAB — URIC ACID: Uric Acid, Serum: 7 mg/dL (ref 4.0–7.8)

## 2023-03-31 LAB — TSH: TSH: 0.72 u[IU]/mL (ref 0.35–5.50)

## 2023-03-31 LAB — HEMOGLOBIN A1C: Hgb A1c MFr Bld: 6.9 % — ABNORMAL HIGH (ref 4.6–6.5)

## 2023-03-31 NOTE — Progress Notes (Signed)
A1c up slightly to 6.9.  Do not need to start meds but he should continue to work on diet and exercise.  We should recheck in 6 months.  The rest of his labs are all stable and we can recheck everything in a year or so.

## 2023-04-05 ENCOUNTER — Ambulatory Visit: Payer: Medicare Other | Admitting: Physician Assistant

## 2023-04-05 ENCOUNTER — Ambulatory Visit: Payer: Medicare Other | Admitting: Cardiology

## 2023-04-24 ENCOUNTER — Ambulatory Visit (INDEPENDENT_AMBULATORY_CARE_PROVIDER_SITE_OTHER): Payer: Medicare Other

## 2023-04-24 ENCOUNTER — Encounter: Payer: Self-pay | Admitting: Sports Medicine

## 2023-04-24 DIAGNOSIS — I480 Paroxysmal atrial fibrillation: Secondary | ICD-10-CM | POA: Diagnosis not present

## 2023-04-24 LAB — CUP PACEART REMOTE DEVICE CHECK
Date Time Interrogation Session: 20240920230016
Implantable Pulse Generator Implant Date: 20210819

## 2023-04-25 ENCOUNTER — Ambulatory Visit
Admission: RE | Admit: 2023-04-25 | Discharge: 2023-04-25 | Disposition: A | Discharge status: Home / Self Care | Payer: Medicare Other | Source: Ambulatory Visit | Attending: Sports Medicine | Admitting: Sports Medicine

## 2023-04-25 DIAGNOSIS — R0781 Pleurodynia: Secondary | ICD-10-CM

## 2023-04-25 DIAGNOSIS — G8929 Other chronic pain: Secondary | ICD-10-CM

## 2023-04-25 DIAGNOSIS — Z85528 Personal history of other malignant neoplasm of kidney: Secondary | ICD-10-CM

## 2023-04-27 DIAGNOSIS — H00015 Hordeolum externum left lower eyelid: Secondary | ICD-10-CM | POA: Diagnosis not present

## 2023-04-27 DIAGNOSIS — H0100B Unspecified blepharitis left eye, upper and lower eyelids: Secondary | ICD-10-CM | POA: Diagnosis not present

## 2023-04-27 DIAGNOSIS — H0100A Unspecified blepharitis right eye, upper and lower eyelids: Secondary | ICD-10-CM | POA: Diagnosis not present

## 2023-05-02 ENCOUNTER — Other Ambulatory Visit: Payer: Self-pay | Admitting: Pain Medicine

## 2023-05-02 ENCOUNTER — Ambulatory Visit: Payer: Medicare Other | Attending: Internal Medicine | Admitting: Internal Medicine

## 2023-05-02 ENCOUNTER — Encounter: Payer: Self-pay | Admitting: Internal Medicine

## 2023-05-02 VITALS — BP 120/70 | HR 57 | Ht 72.0 in | Wt 206.8 lb

## 2023-05-02 DIAGNOSIS — I480 Paroxysmal atrial fibrillation: Secondary | ICD-10-CM | POA: Diagnosis not present

## 2023-05-02 DIAGNOSIS — I10 Essential (primary) hypertension: Secondary | ICD-10-CM

## 2023-05-02 DIAGNOSIS — I4819 Other persistent atrial fibrillation: Secondary | ICD-10-CM | POA: Diagnosis not present

## 2023-05-02 DIAGNOSIS — I2510 Atherosclerotic heart disease of native coronary artery without angina pectoris: Secondary | ICD-10-CM

## 2023-05-02 DIAGNOSIS — M5414 Radiculopathy, thoracic region: Secondary | ICD-10-CM

## 2023-05-02 NOTE — Progress Notes (Signed)
Date of Exam: 10/11/2018 Medical Rec #:  [id number]      Height:       71.0 in Accession #:    [id number]     Weight:       215.0 lb Date of Birth:  1945/09/18      BSA:          2.17 m Patient Age:    77 years        BP:           113/78 mmHg Patient Gender: M              HR:           55 bpm. Exam Location:  Inpatient   Procedure: Transesophageal Echo  Indications:     427.31 atrial fibrillation  History:         Patient has prior history of Echocardiogram examinations, most recent 03/23/2018. Risk Factors: Hypertension and Diabetes. Hx of Atrial Tachycardia.  Sonographer:     Celene Skeen RDCS (AE) Referring Phys:  [id number] BRIDGETTE CHRISTOPHER Diagnosing Phys: Jodelle Red MD    PROCEDURE: Normal Transesophogeal exam. No source of intracardiac mass. No evidence of intracardiac thrombus. Consent was requested emergently by emergency room physicain. After discussion of the risks and benefits of a TEE, an informed consent was obtained from the patient. Local oropharyngeal anesthetic was provided with cetacaine. Patients was under conscious sedation during this procedure. Anesthetic was administered intravenously by performing Physician: of Fentanyl, 8mg  of Versed. The transesophogeal probe was passed through the esophogus of the patient. Imaged were obtained with the patient in a left lateral decubitus position. Image quality was excellent. The patient's vital signs; including heart rate, blood pressure, and oxygen saturation; remained stable throughout the procedure. The patient developed no complications during the procedure.  IMPRESSIONS   1. The left ventricle has low normal systolic function, with an ejection fraction of 50-55%. The cavity size was normal. No evidence of left ventricular regional wall motion abnormalities. 2. The right ventricle has normal systolc function. The cavity was normal. There is no increase in right ventricular wall thickness. 3. Left atrial size was mildly dilated. 4. Right atrial size was mildly dilated. 5. No evidence of mitral valve stenosis. No mitral valve vegetation visualized. 6. The aortic valve is tricuspid. 7. There is evidence of mild  plaque in the descending aorta.  SUMMARY  No LA/LAA or RA/RAA thrombus seen. Normal LAA velocities. Patent pulmonary veins with normal flow. FINDINGS Left Ventricle: The left ventricle has low normal systolic function, with an ejection fraction of 50-55%. The cavity size was normal. There is no increase in left ventricular wall thickness. No evidence of left ventricular regional wall motion abnormalities. Right Ventricle: The right ventricle has normal systolic function. The cavity was normal. There is no increase in right ventricular wall thickness. Left Atrium: Left atrial size was mildly dilated. Right Atrium: Right atrial size was mildly dilated. Right atrial pressure is estimated at 8 mmHg. Interatrial Septum: No atrial level shunt detected by color flow Doppler. Pericardium: There is no evidence of pericardial effusion. Mitral Valve: The mitral valve is normal in structure. Mitral valve regurgitation is mild by color flow Doppler. No evidence of mitral valve stenosis. There is no evidence of mitral valve vegetation. Tricuspid Valve: The tricuspid valve was normal in structure. Tricuspid valve regurgitation is mild by color flow Doppler. No TV vegetation was visualized. Aortic Valve: The aortic valve is tricuspid Aortic valve regurgitation was not visualized  Date of Exam: 10/11/2018 Medical Rec #:  [id number]      Height:       71.0 in Accession #:    [id number]     Weight:       215.0 lb Date of Birth:  1945/09/18      BSA:          2.17 m Patient Age:    77 years        BP:           113/78 mmHg Patient Gender: M              HR:           55 bpm. Exam Location:  Inpatient   Procedure: Transesophageal Echo  Indications:     427.31 atrial fibrillation  History:         Patient has prior history of Echocardiogram examinations, most recent 03/23/2018. Risk Factors: Hypertension and Diabetes. Hx of Atrial Tachycardia.  Sonographer:     Celene Skeen RDCS (AE) Referring Phys:  [id number] BRIDGETTE CHRISTOPHER Diagnosing Phys: Jodelle Red MD    PROCEDURE: Normal Transesophogeal exam. No source of intracardiac mass. No evidence of intracardiac thrombus. Consent was requested emergently by emergency room physicain. After discussion of the risks and benefits of a TEE, an informed consent was obtained from the patient. Local oropharyngeal anesthetic was provided with cetacaine. Patients was under conscious sedation during this procedure. Anesthetic was administered intravenously by performing Physician: of Fentanyl, 8mg  of Versed. The transesophogeal probe was passed through the esophogus of the patient. Imaged were obtained with the patient in a left lateral decubitus position. Image quality was excellent. The patient's vital signs; including heart rate, blood pressure, and oxygen saturation; remained stable throughout the procedure. The patient developed no complications during the procedure.  IMPRESSIONS   1. The left ventricle has low normal systolic function, with an ejection fraction of 50-55%. The cavity size was normal. No evidence of left ventricular regional wall motion abnormalities. 2. The right ventricle has normal systolc function. The cavity was normal. There is no increase in right ventricular wall thickness. 3. Left atrial size was mildly dilated. 4. Right atrial size was mildly dilated. 5. No evidence of mitral valve stenosis. No mitral valve vegetation visualized. 6. The aortic valve is tricuspid. 7. There is evidence of mild  plaque in the descending aorta.  SUMMARY  No LA/LAA or RA/RAA thrombus seen. Normal LAA velocities. Patent pulmonary veins with normal flow. FINDINGS Left Ventricle: The left ventricle has low normal systolic function, with an ejection fraction of 50-55%. The cavity size was normal. There is no increase in left ventricular wall thickness. No evidence of left ventricular regional wall motion abnormalities. Right Ventricle: The right ventricle has normal systolic function. The cavity was normal. There is no increase in right ventricular wall thickness. Left Atrium: Left atrial size was mildly dilated. Right Atrium: Right atrial size was mildly dilated. Right atrial pressure is estimated at 8 mmHg. Interatrial Septum: No atrial level shunt detected by color flow Doppler. Pericardium: There is no evidence of pericardial effusion. Mitral Valve: The mitral valve is normal in structure. Mitral valve regurgitation is mild by color flow Doppler. No evidence of mitral valve stenosis. There is no evidence of mitral valve vegetation. Tricuspid Valve: The tricuspid valve was normal in structure. Tricuspid valve regurgitation is mild by color flow Doppler. No TV vegetation was visualized. Aortic Valve: The aortic valve is tricuspid Aortic valve regurgitation was not visualized  Date of Exam: 10/11/2018 Medical Rec #:  [id number]      Height:       71.0 in Accession #:    [id number]     Weight:       215.0 lb Date of Birth:  1945/09/18      BSA:          2.17 m Patient Age:    77 years        BP:           113/78 mmHg Patient Gender: M              HR:           55 bpm. Exam Location:  Inpatient   Procedure: Transesophageal Echo  Indications:     427.31 atrial fibrillation  History:         Patient has prior history of Echocardiogram examinations, most recent 03/23/2018. Risk Factors: Hypertension and Diabetes. Hx of Atrial Tachycardia.  Sonographer:     Celene Skeen RDCS (AE) Referring Phys:  [id number] BRIDGETTE CHRISTOPHER Diagnosing Phys: Jodelle Red MD    PROCEDURE: Normal Transesophogeal exam. No source of intracardiac mass. No evidence of intracardiac thrombus. Consent was requested emergently by emergency room physicain. After discussion of the risks and benefits of a TEE, an informed consent was obtained from the patient. Local oropharyngeal anesthetic was provided with cetacaine. Patients was under conscious sedation during this procedure. Anesthetic was administered intravenously by performing Physician: of Fentanyl, 8mg  of Versed. The transesophogeal probe was passed through the esophogus of the patient. Imaged were obtained with the patient in a left lateral decubitus position. Image quality was excellent. The patient's vital signs; including heart rate, blood pressure, and oxygen saturation; remained stable throughout the procedure. The patient developed no complications during the procedure.  IMPRESSIONS   1. The left ventricle has low normal systolic function, with an ejection fraction of 50-55%. The cavity size was normal. No evidence of left ventricular regional wall motion abnormalities. 2. The right ventricle has normal systolc function. The cavity was normal. There is no increase in right ventricular wall thickness. 3. Left atrial size was mildly dilated. 4. Right atrial size was mildly dilated. 5. No evidence of mitral valve stenosis. No mitral valve vegetation visualized. 6. The aortic valve is tricuspid. 7. There is evidence of mild  plaque in the descending aorta.  SUMMARY  No LA/LAA or RA/RAA thrombus seen. Normal LAA velocities. Patent pulmonary veins with normal flow. FINDINGS Left Ventricle: The left ventricle has low normal systolic function, with an ejection fraction of 50-55%. The cavity size was normal. There is no increase in left ventricular wall thickness. No evidence of left ventricular regional wall motion abnormalities. Right Ventricle: The right ventricle has normal systolic function. The cavity was normal. There is no increase in right ventricular wall thickness. Left Atrium: Left atrial size was mildly dilated. Right Atrium: Right atrial size was mildly dilated. Right atrial pressure is estimated at 8 mmHg. Interatrial Septum: No atrial level shunt detected by color flow Doppler. Pericardium: There is no evidence of pericardial effusion. Mitral Valve: The mitral valve is normal in structure. Mitral valve regurgitation is mild by color flow Doppler. No evidence of mitral valve stenosis. There is no evidence of mitral valve vegetation. Tricuspid Valve: The tricuspid valve was normal in structure. Tricuspid valve regurgitation is mild by color flow Doppler. No TV vegetation was visualized. Aortic Valve: The aortic valve is tricuspid Aortic valve regurgitation was not visualized  Date of Exam: 10/11/2018 Medical Rec #:  [id number]      Height:       71.0 in Accession #:    [id number]     Weight:       215.0 lb Date of Birth:  1945/09/18      BSA:          2.17 m Patient Age:    77 years        BP:           113/78 mmHg Patient Gender: M              HR:           55 bpm. Exam Location:  Inpatient   Procedure: Transesophageal Echo  Indications:     427.31 atrial fibrillation  History:         Patient has prior history of Echocardiogram examinations, most recent 03/23/2018. Risk Factors: Hypertension and Diabetes. Hx of Atrial Tachycardia.  Sonographer:     Celene Skeen RDCS (AE) Referring Phys:  [id number] BRIDGETTE CHRISTOPHER Diagnosing Phys: Jodelle Red MD    PROCEDURE: Normal Transesophogeal exam. No source of intracardiac mass. No evidence of intracardiac thrombus. Consent was requested emergently by emergency room physicain. After discussion of the risks and benefits of a TEE, an informed consent was obtained from the patient. Local oropharyngeal anesthetic was provided with cetacaine. Patients was under conscious sedation during this procedure. Anesthetic was administered intravenously by performing Physician: of Fentanyl, 8mg  of Versed. The transesophogeal probe was passed through the esophogus of the patient. Imaged were obtained with the patient in a left lateral decubitus position. Image quality was excellent. The patient's vital signs; including heart rate, blood pressure, and oxygen saturation; remained stable throughout the procedure. The patient developed no complications during the procedure.  IMPRESSIONS   1. The left ventricle has low normal systolic function, with an ejection fraction of 50-55%. The cavity size was normal. No evidence of left ventricular regional wall motion abnormalities. 2. The right ventricle has normal systolc function. The cavity was normal. There is no increase in right ventricular wall thickness. 3. Left atrial size was mildly dilated. 4. Right atrial size was mildly dilated. 5. No evidence of mitral valve stenosis. No mitral valve vegetation visualized. 6. The aortic valve is tricuspid. 7. There is evidence of mild  plaque in the descending aorta.  SUMMARY  No LA/LAA or RA/RAA thrombus seen. Normal LAA velocities. Patent pulmonary veins with normal flow. FINDINGS Left Ventricle: The left ventricle has low normal systolic function, with an ejection fraction of 50-55%. The cavity size was normal. There is no increase in left ventricular wall thickness. No evidence of left ventricular regional wall motion abnormalities. Right Ventricle: The right ventricle has normal systolic function. The cavity was normal. There is no increase in right ventricular wall thickness. Left Atrium: Left atrial size was mildly dilated. Right Atrium: Right atrial size was mildly dilated. Right atrial pressure is estimated at 8 mmHg. Interatrial Septum: No atrial level shunt detected by color flow Doppler. Pericardium: There is no evidence of pericardial effusion. Mitral Valve: The mitral valve is normal in structure. Mitral valve regurgitation is mild by color flow Doppler. No evidence of mitral valve stenosis. There is no evidence of mitral valve vegetation. Tricuspid Valve: The tricuspid valve was normal in structure. Tricuspid valve regurgitation is mild by color flow Doppler. No TV vegetation was visualized. Aortic Valve: The aortic valve is tricuspid Aortic valve regurgitation was not visualized  normal. Systolic function was normal. - Right atrium: The atrium was normal in size. - Tricuspid valve: There was mild regurgitation. - Pulmonary arteries: Systolic pressure was at the upper limits of normal. - Inferior vena cava: The  vessel was normal in size. - Pericardium, extracardiac: There was no pericardial effusion.  ------------------------------------------------------------------- Study data:  Comparison was made to the study of 01/05/2016.  Study status:  Routine.  Procedure:  The patient reported no pain pre or post test. Transthoracic echocardiography. Image quality was adequate.  Study completion:  There were no complications. Transthoracic echocardiography.  M-mode, complete 2D, spectral Doppler, and color Doppler.  Birthdate:  Patient birthdate: [birth year].  Age:  Patient is 77 yr old.  Sex:  Gender: male. BMI: 28.3 kg/m^2.  Blood pressure:     112/68  Patient status: Inpatient.  Study date:  Study date: 03/23/2018. Study time: 03:20 PM.  Location:  Echo laboratory.  -------------------------------------------------------------------  ------------------------------------------------------------------- Left ventricle:  The cavity size was normal. Systolic function was normal. The estimated ejection fraction was in the range of 60% to 65%. Wall motion was normal; there were no regional wall motion abnormalities. Doppler parameters are consistent with abnormal left ventricular relaxation (grade 1 diastolic dysfunction). There was no evidence of elevated ventricular filling pressure by Doppler parameters.  ------------------------------------------------------------------- Aortic valve:   Trileaflet; normal thickness leaflets. Mobility was not restricted.  Doppler:  Transvalvular velocity was within the normal range. There was no stenosis. There was no regurgitation.  ------------------------------------------------------------------- Aorta:  Aortic root: The aortic root was normal in size.  ------------------------------------------------------------------- Mitral valve:   Structurally normal valve.   Mobility was not restricted.  Doppler:  Transvalvular velocity was within the normal range.  There was no evidence for stenosis. There was trivial regurgitation.    Peak gradient (D): 2 mm Hg.  ------------------------------------------------------------------- Left atrium:  The atrium was mildly dilated.  ------------------------------------------------------------------- Right ventricle:  The cavity size was normal. Wall thickness was normal. Systolic function was normal.  ------------------------------------------------------------------- Pulmonic valve:    Structurally normal valve.   Cusp separation was normal.  Doppler:  Transvalvular velocity was within the normal range. There was no evidence for stenosis. There was no regurgitation.  ------------------------------------------------------------------- Tricuspid valve:   Structurally normal valve.    Doppler: Transvalvular velocity was within the normal range. There was mild regurgitation.  ------------------------------------------------------------------- Pulmonary artery:   The main pulmonary artery was normal-sized. Systolic pressure was at the upper limits of normal.  ------------------------------------------------------------------- Right atrium:  The atrium was normal in size.  ------------------------------------------------------------------- Pericardium:  There was no pericardial effusion.  ------------------------------------------------------------------- Systemic veins: Inferior vena cava: The vessel was normal in size.  ------------------------------------------------------------------- Measurements  Left ventricle                          Value        Reference LV ID, ED, PLAX chordal         (H)     53    mm     43 - 52 LV ID, ES, PLAX chordal                 33    mm     23 - 38 LV fx shortening, PLAX chordal          38    %      >=29 LV PW thickness, ED  normal. Systolic function was normal. - Right atrium: The atrium was normal in size. - Tricuspid valve: There was mild regurgitation. - Pulmonary arteries: Systolic pressure was at the upper limits of normal. - Inferior vena cava: The  vessel was normal in size. - Pericardium, extracardiac: There was no pericardial effusion.  ------------------------------------------------------------------- Study data:  Comparison was made to the study of 01/05/2016.  Study status:  Routine.  Procedure:  The patient reported no pain pre or post test. Transthoracic echocardiography. Image quality was adequate.  Study completion:  There were no complications. Transthoracic echocardiography.  M-mode, complete 2D, spectral Doppler, and color Doppler.  Birthdate:  Patient birthdate: [birth year].  Age:  Patient is 77 yr old.  Sex:  Gender: male. BMI: 28.3 kg/m^2.  Blood pressure:     112/68  Patient status: Inpatient.  Study date:  Study date: 03/23/2018. Study time: 03:20 PM.  Location:  Echo laboratory.  -------------------------------------------------------------------  ------------------------------------------------------------------- Left ventricle:  The cavity size was normal. Systolic function was normal. The estimated ejection fraction was in the range of 60% to 65%. Wall motion was normal; there were no regional wall motion abnormalities. Doppler parameters are consistent with abnormal left ventricular relaxation (grade 1 diastolic dysfunction). There was no evidence of elevated ventricular filling pressure by Doppler parameters.  ------------------------------------------------------------------- Aortic valve:   Trileaflet; normal thickness leaflets. Mobility was not restricted.  Doppler:  Transvalvular velocity was within the normal range. There was no stenosis. There was no regurgitation.  ------------------------------------------------------------------- Aorta:  Aortic root: The aortic root was normal in size.  ------------------------------------------------------------------- Mitral valve:   Structurally normal valve.   Mobility was not restricted.  Doppler:  Transvalvular velocity was within the normal range.  There was no evidence for stenosis. There was trivial regurgitation.    Peak gradient (D): 2 mm Hg.  ------------------------------------------------------------------- Left atrium:  The atrium was mildly dilated.  ------------------------------------------------------------------- Right ventricle:  The cavity size was normal. Wall thickness was normal. Systolic function was normal.  ------------------------------------------------------------------- Pulmonic valve:    Structurally normal valve.   Cusp separation was normal.  Doppler:  Transvalvular velocity was within the normal range. There was no evidence for stenosis. There was no regurgitation.  ------------------------------------------------------------------- Tricuspid valve:   Structurally normal valve.    Doppler: Transvalvular velocity was within the normal range. There was mild regurgitation.  ------------------------------------------------------------------- Pulmonary artery:   The main pulmonary artery was normal-sized. Systolic pressure was at the upper limits of normal.  ------------------------------------------------------------------- Right atrium:  The atrium was normal in size.  ------------------------------------------------------------------- Pericardium:  There was no pericardial effusion.  ------------------------------------------------------------------- Systemic veins: Inferior vena cava: The vessel was normal in size.  ------------------------------------------------------------------- Measurements  Left ventricle                          Value        Reference LV ID, ED, PLAX chordal         (H)     53    mm     43 - 52 LV ID, ES, PLAX chordal                 33    mm     23 - 38 LV fx shortening, PLAX chordal          38    %      >=29 LV PW thickness, ED  Cardiology Office Note:  .    Date:  05/02/2023  ID:  Timothy Dickson, DOB 1946/01/14, MRN [id number] PCP: Ardith Dark, MD  [facility] Providers Cardiologist:  Meriam Sprague, MD Electrophysiologist:  Hillis Range, MD (Inactive)     CC: Transition to new cardiologist  History of Present Illness: .    Timothy Dickson is a 77 y.o. 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  Discussed the use of AI scribe software for clinical note transcription with the patient, who gave verbal consent to proceed.  History of Present Illness         Mr. Liuzzi, a 77 year old male with a history of paroxysmal atrial fibrillation, hypertension, hyperlipidemia, and a mild aortic dilation, presents for a routine follow-up. His atrial fibrillation is well-managed with flecainide and carvedilol, and he reports being in normal rhythm 99.9% of the time. His hypertension is controlled with carvedilol and hydrochlorothiazide. His hyperlipidemia is well-managed with Repatha, with a recent LDL level of 38.  He expresses concern about this, as well as a small pulmonary nodule that was identified on a previous CT scan. He reports no symptoms related to these conditions.  The patient also mentions a recent issue with chest and back pain, which was evaluated by another provider. The pain was not related to his heart, and it is suspected to be related to his back.  Relevant histories: .  Social- former Medical illustrator patient; will see his wife in 2024 ROS: As per HPI.   Studies Reviewed: .   Cardiac Studies & Procedures     STRESS TESTS  EXERCISE TOLERANCE TEST (ETT) 04/17/2018  Narrative  Blood pressure demonstrated a normal response to exercise.  There was no ST segment deviation noted during stress.  ETT with good exercise tolerance (9:09); no chest pain; normal blood pressure response;  no ST changes; negative adequate exercise tolerance test; Duke treadmill score 9.   ECHOCARDIOGRAM  ECHOCARDIOGRAM COMPLETE 03/23/2018  Narrative *[facility] [place]* *[facility]* [address] [place], [place] [address] [phone]  ------------------------------------------------------------------- Transthoracic Echocardiography  Patient:    Timothy Dickson, Timothy Dickson MR #:       [id number] Study Date: 03/23/2018 Gender:     M Age:        72 Height:     181.6 cm Weight:     93.2 kg BSA:        2.19 m^2 Pt. Status: Room:  ATTENDING    Linton Ham REFERRING    [facility] PERFORMING   [facility], Outpatient SONOGRAPHER  Celene Skeen, RDCS  cc:  ------------------------------------------------------------------- LV EF: 60% -   65%  ------------------------------------------------------------------- History:   PMH:  Paroxysmal A-Fib Evaluation.  Risk factors: Hypertension. Diabetes mellitus. Dyslipidemia.  ------------------------------------------------------------------- Study Conclusions  - Left ventricle: The cavity size was normal. Systolic function was normal. The estimated ejection fraction was in the range of 60% to 65%. Wall motion was normal; there were no regional wall motion abnormalities. Doppler parameters are consistent with abnormal left ventricular relaxation (grade 1 diastolic dysfunction). There was no evidence of elevated ventricular filling pressure by Doppler parameters. - Aortic valve: There was no regurgitation. - Aortic root: The aortic root was normal in size. - Left atrium: The atrium was mildly dilated. - Right ventricle: The cavity size was normal. Wall thickness was  Date of Exam: 10/11/2018 Medical Rec #:  [id number]      Height:       71.0 in Accession #:    [id number]     Weight:       215.0 lb Date of Birth:  1945/09/18      BSA:          2.17 m Patient Age:    77 years        BP:           113/78 mmHg Patient Gender: M              HR:           55 bpm. Exam Location:  Inpatient   Procedure: Transesophageal Echo  Indications:     427.31 atrial fibrillation  History:         Patient has prior history of Echocardiogram examinations, most recent 03/23/2018. Risk Factors: Hypertension and Diabetes. Hx of Atrial Tachycardia.  Sonographer:     Celene Skeen RDCS (AE) Referring Phys:  [id number] BRIDGETTE CHRISTOPHER Diagnosing Phys: Jodelle Red MD    PROCEDURE: Normal Transesophogeal exam. No source of intracardiac mass. No evidence of intracardiac thrombus. Consent was requested emergently by emergency room physicain. After discussion of the risks and benefits of a TEE, an informed consent was obtained from the patient. Local oropharyngeal anesthetic was provided with cetacaine. Patients was under conscious sedation during this procedure. Anesthetic was administered intravenously by performing Physician: of Fentanyl, 8mg  of Versed. The transesophogeal probe was passed through the esophogus of the patient. Imaged were obtained with the patient in a left lateral decubitus position. Image quality was excellent. The patient's vital signs; including heart rate, blood pressure, and oxygen saturation; remained stable throughout the procedure. The patient developed no complications during the procedure.  IMPRESSIONS   1. The left ventricle has low normal systolic function, with an ejection fraction of 50-55%. The cavity size was normal. No evidence of left ventricular regional wall motion abnormalities. 2. The right ventricle has normal systolc function. The cavity was normal. There is no increase in right ventricular wall thickness. 3. Left atrial size was mildly dilated. 4. Right atrial size was mildly dilated. 5. No evidence of mitral valve stenosis. No mitral valve vegetation visualized. 6. The aortic valve is tricuspid. 7. There is evidence of mild  plaque in the descending aorta.  SUMMARY  No LA/LAA or RA/RAA thrombus seen. Normal LAA velocities. Patent pulmonary veins with normal flow. FINDINGS Left Ventricle: The left ventricle has low normal systolic function, with an ejection fraction of 50-55%. The cavity size was normal. There is no increase in left ventricular wall thickness. No evidence of left ventricular regional wall motion abnormalities. Right Ventricle: The right ventricle has normal systolic function. The cavity was normal. There is no increase in right ventricular wall thickness. Left Atrium: Left atrial size was mildly dilated. Right Atrium: Right atrial size was mildly dilated. Right atrial pressure is estimated at 8 mmHg. Interatrial Septum: No atrial level shunt detected by color flow Doppler. Pericardium: There is no evidence of pericardial effusion. Mitral Valve: The mitral valve is normal in structure. Mitral valve regurgitation is mild by color flow Doppler. No evidence of mitral valve stenosis. There is no evidence of mitral valve vegetation. Tricuspid Valve: The tricuspid valve was normal in structure. Tricuspid valve regurgitation is mild by color flow Doppler. No TV vegetation was visualized. Aortic Valve: The aortic valve is tricuspid Aortic valve regurgitation was not visualized

## 2023-05-02 NOTE — Patient Instructions (Signed)
Medication Instructions:  Your physician recommends that you continue on your current medications as directed. Please refer to the Current Medication list given to you today.  *If you need a refill on your cardiac medications before your next appointment, please call your pharmacy*   Lab Work: NONE If you have labs (blood work) drawn today and your tests are completely normal, you will receive your results only by: MyChart Message (if you have MyChart) OR A paper copy in the mail If you have any lab test that is abnormal or we need to change your treatment, we will call you to review the results.   Testing/Procedures: NONE   Follow-Up: At [facility], you and your health needs are our priority.  As part of our continuing mission to provide you with exceptional heart care, we have created designated Provider Care Teams.  These Care Teams include your primary Cardiologist (physician) and Advanced Practice Providers (APPs -  Physician Assistants and Nurse Practitioners) who all work together to provide you with the care you need, when you need it.   Your next appointment:   11 months  Provider:   Riley Lam, MD

## 2023-05-04 DIAGNOSIS — G5622 Lesion of ulnar nerve, left upper limb: Secondary | ICD-10-CM | POA: Diagnosis not present

## 2023-05-05 NOTE — Progress Notes (Signed)
Carelink Summary Report / Loop Recorder 

## 2023-05-09 ENCOUNTER — Other Ambulatory Visit: Payer: Self-pay | Admitting: Internal Medicine

## 2023-05-17 ENCOUNTER — Telehealth: Payer: Self-pay | Admitting: Internal Medicine

## 2023-05-17 DIAGNOSIS — I480 Paroxysmal atrial fibrillation: Secondary | ICD-10-CM

## 2023-05-17 NOTE — Telephone Encounter (Signed)
*  STAT* If patient is at the pharmacy, call can be transferred to refill team.   1. Which medications need to be refilled? (please list name of each medication and dose if known)   ELIQUIS 5 MG TABS tablet    2. Which pharmacy/location (including street and city if local pharmacy) is medication to be sent to?  [facility] - [place], [place] - [address]     3. Do they need a 30 day or 90 day supply? 30 day    Pt is out of this medication

## 2023-05-18 MED ORDER — APIXABAN 5 MG PO TABS
5.0000 mg | ORAL_TABLET | Freq: Two times a day (BID) | ORAL | 5 refills | Status: DC
Start: 2023-05-18 — End: 2023-06-08

## 2023-05-18 NOTE — Telephone Encounter (Signed)
Prescription refill request for Eliquis received. Indication: afib  Last office visit: Chandrasekhar, 05/02/2023 Scr: 1.13, 02/28/2023 Age: 77 yo  Weight: 93.8 kg   Called and spoke to pt who stated he is not out of Eliquis and just got a refill.   Pt stated [facility] is going to start getting Eliquis through mail order. Will send in refill request.

## 2023-05-21 ENCOUNTER — Ambulatory Visit
Admission: RE | Admit: 2023-05-21 | Discharge: 2023-05-21 | Disposition: A | Discharge status: Home / Self Care | Payer: Medicare Other | Source: Ambulatory Visit | Attending: Pain Medicine | Admitting: Pain Medicine

## 2023-05-21 DIAGNOSIS — M546 Pain in thoracic spine: Secondary | ICD-10-CM | POA: Diagnosis not present

## 2023-05-21 DIAGNOSIS — M5414 Radiculopathy, thoracic region: Secondary | ICD-10-CM

## 2023-05-29 ENCOUNTER — Ambulatory Visit: Payer: Medicare Other

## 2023-05-29 DIAGNOSIS — G5622 Lesion of ulnar nerve, left upper limb: Secondary | ICD-10-CM | POA: Diagnosis not present

## 2023-05-29 DIAGNOSIS — M4692 Unspecified inflammatory spondylopathy, cervical region: Secondary | ICD-10-CM | POA: Diagnosis not present

## 2023-05-29 DIAGNOSIS — I480 Paroxysmal atrial fibrillation: Secondary | ICD-10-CM

## 2023-05-30 LAB — CUP PACEART REMOTE DEVICE CHECK
Date Time Interrogation Session: 20241027230154
Implantable Pulse Generator Implant Date: 20210819

## 2023-05-31 DIAGNOSIS — M5414 Radiculopathy, thoracic region: Secondary | ICD-10-CM | POA: Diagnosis not present

## 2023-05-31 DIAGNOSIS — M47812 Spondylosis without myelopathy or radiculopathy, cervical region: Secondary | ICD-10-CM | POA: Diagnosis not present

## 2023-05-31 DIAGNOSIS — Z6828 Body mass index (BMI) 28.0-28.9, adult: Secondary | ICD-10-CM | POA: Diagnosis not present

## 2023-06-05 ENCOUNTER — Ambulatory Visit: Payer: Medicare Other | Admitting: Interventional Cardiology

## 2023-06-08 ENCOUNTER — Telehealth: Payer: Self-pay | Admitting: Internal Medicine

## 2023-06-08 DIAGNOSIS — I480 Paroxysmal atrial fibrillation: Secondary | ICD-10-CM

## 2023-06-08 MED ORDER — APIXABAN 5 MG PO TABS
5.0000 mg | ORAL_TABLET | Freq: Two times a day (BID) | ORAL | 1 refills | Status: DC
Start: 2023-06-08 — End: 2023-07-27

## 2023-06-08 NOTE — Telephone Encounter (Signed)
*  STAT* If patient is at the pharmacy, call can be transferred to refill team.   1. Which medications need to be refilled? (please list name of each medication and dose if known)  apixaban (ELIQUIS) 5 MG TABS tablet   2. Which pharmacy/location (including street and city if local pharmacy) is medication to be sent to?  [facility] - [place], [place] - [address] Phone: [phone]  Fax: [phone]      3. Do they need a 30 day or 90 day supply? 90

## 2023-06-08 NOTE — Telephone Encounter (Signed)
Prescription refill request for Eliquis received. Indication: Afib  Last office visit: 05/02/23 Timothy Dickson)  Scr: 1.13 (02/28/23)  Age: 77 Weight: 93.8kg  Appropriate dose. Refill sent.

## 2023-06-09 ENCOUNTER — Encounter: Payer: Self-pay | Admitting: Gastroenterology

## 2023-06-10 ENCOUNTER — Encounter: Payer: Self-pay | Admitting: Family Medicine

## 2023-06-13 ENCOUNTER — Ambulatory Visit: Payer: Medicare Other | Admitting: Internal Medicine

## 2023-06-15 NOTE — Progress Notes (Signed)
Carelink Summary Report / Loop Recorder 

## 2023-06-17 ENCOUNTER — Other Ambulatory Visit: Payer: Self-pay | Admitting: Internal Medicine

## 2023-07-02 LAB — CUP PACEART REMOTE DEVICE CHECK
Date Time Interrogation Session: 20241129230212
Implantable Pulse Generator Implant Date: 20210819

## 2023-07-03 ENCOUNTER — Ambulatory Visit (INDEPENDENT_AMBULATORY_CARE_PROVIDER_SITE_OTHER): Payer: Medicare Other

## 2023-07-03 DIAGNOSIS — I480 Paroxysmal atrial fibrillation: Secondary | ICD-10-CM

## 2023-07-05 ENCOUNTER — Ambulatory Visit: Payer: Medicare Other | Admitting: Physician Assistant

## 2023-07-17 DIAGNOSIS — Z85828 Personal history of other malignant neoplasm of skin: Secondary | ICD-10-CM | POA: Diagnosis not present

## 2023-07-17 DIAGNOSIS — L570 Actinic keratosis: Secondary | ICD-10-CM | POA: Diagnosis not present

## 2023-07-17 DIAGNOSIS — H61002 Unspecified perichondritis of left external ear: Secondary | ICD-10-CM | POA: Diagnosis not present

## 2023-07-17 DIAGNOSIS — L821 Other seborrheic keratosis: Secondary | ICD-10-CM | POA: Diagnosis not present

## 2023-07-20 ENCOUNTER — Other Ambulatory Visit (HOSPITAL_COMMUNITY): Payer: Self-pay

## 2023-07-21 ENCOUNTER — Other Ambulatory Visit (HOSPITAL_COMMUNITY): Payer: Self-pay

## 2023-07-25 ENCOUNTER — Telehealth: Payer: Self-pay | Admitting: Physician Assistant

## 2023-07-25 NOTE — Telephone Encounter (Signed)
   Pt c/o of Chest Pain: STAT if active CP, including tightness, pressure, jaw pain, radiating pain to shoulder/upper arm/back, CP unrelieved by Nitro. Symptoms reported of SOB, nausea, vomiting, sweating.  1. Are you having CP right now? Pain in the center of his chest, when he turns- not at this time    2. Are you experiencing any other symptoms (ex. SOB, nausea, vomiting, sweating)? no   3. Is your CP continuous or coming and going? Comes and goes   4. Have you taken Nitroglycerin?    5. How long have you been experiencing CP?  A few days- he thinks it might be muscular    6. If NO CP at time of call then end call with telling Pt to call back or call 911 if Chest pain returns prior to return call from triage team.

## 2023-07-25 NOTE — Telephone Encounter (Signed)
Symptoms sound noncardiac. Would follow up as planned and see PCP if symptoms continue. If chest pain worsens or becomes more worrisome or is assoc with shortness of breath, nausea, diaphoresis, go to the ED. [name], PA-C    07/25/2023 11:36 AM

## 2023-07-25 NOTE — Telephone Encounter (Signed)
Pt called back and explained he feels the CP he is experiencing is muscular in nature but he wanted to call in and let us know just in case. Pt stated that he has had CP for years and has had multiple scans that other physicians have ruled to be intercostal muscular pain. He said the only difference with this pain is instead of it being on the left or right side, this time it is in the middle of his chest. Pt stated he had recently installed and TV and was using a step-ladder and thinks this could be one reason he is experiencing this pain. Pt told message would be sent to [name], PA to look over (pt has appt with Lorin Picket on 09/26/23). Pt denies any other s/s and states the CP is only when he sits or lays in certain positions.

## 2023-07-25 NOTE — Telephone Encounter (Signed)
Attempted to reach pt over the phone but unable to get them on the phone. LMTCB.

## 2023-07-25 NOTE — Telephone Encounter (Signed)
Called pt and discussed Timothy Newcomer, PA-C recommendations:  Symptoms sound noncardiac. Would follow up as planned and see PCP if symptoms continue. If chest pain worsens or becomes more worrisome or is assoc with shortness of breath, nausea, diaphoresis, go to the ED. Timothy Newcomer, PA-C    07/25/2023 11:36 AM     Pt verbalized understanding and had no further questions at this time.

## 2023-07-27 ENCOUNTER — Other Ambulatory Visit: Payer: Self-pay

## 2023-07-27 ENCOUNTER — Telehealth: Payer: Self-pay | Admitting: Internal Medicine

## 2023-07-27 DIAGNOSIS — I480 Paroxysmal atrial fibrillation: Secondary | ICD-10-CM

## 2023-07-27 MED ORDER — APIXABAN 5 MG PO TABS
5.0000 mg | ORAL_TABLET | Freq: Two times a day (BID) | ORAL | 11 refills | Status: DC
  Filled 2024-01-30: qty 60, 30d supply, fill #0

## 2023-07-27 NOTE — Telephone Encounter (Signed)
Pt is requesting a refill of eliquis

## 2023-07-27 NOTE — Telephone Encounter (Signed)
*  STAT* If patient is at the pharmacy, call can be transferred to refill team.   1. Which medications need to be refilled? (please list name of each medication and dose if known) Eliquis- new prescription to [facility]   2. Would you like to learn more about the convenience, safety, & potential cost savings by using the [facility]?    3. Are you open to using the [facility] (Type [facility].    4. Which pharmacy/location (including street and city if local pharmacy) is medication to be sent to? [facility]   5. Do they need a 30 day or 90 day supply? 90 days and refill

## 2023-07-27 NOTE — Telephone Encounter (Signed)
Prescription refill request for Eliquis received. Indication: a fib Last office visit: 05/02/23 Scr: 1.13 epic 02/28/23 Age: 77 Weight: 93kg

## 2023-07-31 ENCOUNTER — Telehealth: Payer: Self-pay | Admitting: Gastroenterology

## 2023-07-31 NOTE — Telephone Encounter (Signed)
Left message for patient to call back  

## 2023-07-31 NOTE — Telephone Encounter (Signed)
Patient stated he would like to speak to a nurse in regards to having a pain under his sternum. Please advise.

## 2023-08-01 NOTE — Telephone Encounter (Signed)
 Spoke with patient who states that he was having some substernal chest pain yesterday, but this has since improved and has returned to his left ribcage. 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 Patient says he is overdue for appointment with Dr Leigh. He has been scheduled for follow up on 08/09/23. Patient is advised that should he have any change in symptoms to include worsening chest pain, associated nausea/vomiting, diaphoresis, SOB, dizziness, he should be seen in the emergency room. He verbalizes understanding.

## 2023-08-07 ENCOUNTER — Ambulatory Visit (INDEPENDENT_AMBULATORY_CARE_PROVIDER_SITE_OTHER): Payer: Medicare Other

## 2023-08-07 DIAGNOSIS — I480 Paroxysmal atrial fibrillation: Secondary | ICD-10-CM | POA: Diagnosis not present

## 2023-08-07 LAB — CUP PACEART REMOTE DEVICE CHECK
Date Time Interrogation Session: 20250105230132
Implantable Pulse Generator Implant Date: 20210819

## 2023-08-09 ENCOUNTER — Other Ambulatory Visit (HOSPITAL_COMMUNITY): Payer: Self-pay

## 2023-08-09 ENCOUNTER — Encounter: Payer: Self-pay | Admitting: Gastroenterology

## 2023-08-09 ENCOUNTER — Other Ambulatory Visit (INDEPENDENT_AMBULATORY_CARE_PROVIDER_SITE_OTHER): Payer: Medicare Other

## 2023-08-09 ENCOUNTER — Ambulatory Visit: Payer: Medicare Other | Admitting: Gastroenterology

## 2023-08-09 VITALS — BP 130/68 | Ht 72.0 in | Wt 207.4 lb

## 2023-08-09 DIAGNOSIS — Z8619 Personal history of other infectious and parasitic diseases: Secondary | ICD-10-CM | POA: Diagnosis not present

## 2023-08-09 DIAGNOSIS — Z7901 Long term (current) use of anticoagulants: Secondary | ICD-10-CM

## 2023-08-09 DIAGNOSIS — K7460 Unspecified cirrhosis of liver: Secondary | ICD-10-CM

## 2023-08-09 DIAGNOSIS — Z79899 Other long term (current) drug therapy: Secondary | ICD-10-CM

## 2023-08-09 DIAGNOSIS — K219 Gastro-esophageal reflux disease without esophagitis: Secondary | ICD-10-CM | POA: Diagnosis not present

## 2023-08-09 DIAGNOSIS — G5622 Lesion of ulnar nerve, left upper limb: Secondary | ICD-10-CM | POA: Diagnosis not present

## 2023-08-09 DIAGNOSIS — R0781 Pleurodynia: Secondary | ICD-10-CM

## 2023-08-09 LAB — CBC WITH DIFFERENTIAL/PLATELET
Basophils Absolute: 0.1 10*3/uL (ref 0.0–0.1)
Basophils Relative: 0.9 % (ref 0.0–3.0)
Eosinophils Absolute: 0.2 10*3/uL (ref 0.0–0.7)
Eosinophils Relative: 2.5 % (ref 0.0–5.0)
HCT: 45.7 % (ref 39.0–52.0)
Hemoglobin: 15.5 g/dL (ref 13.0–17.0)
Lymphocytes Relative: 20.6 % (ref 12.0–46.0)
Lymphs Abs: 1.3 10*3/uL (ref 0.7–4.0)
MCHC: 33.8 g/dL (ref 30.0–36.0)
MCV: 94.2 fL (ref 78.0–100.0)
Monocytes Absolute: 0.5 10*3/uL (ref 0.1–1.0)
Monocytes Relative: 7.2 % (ref 3.0–12.0)
Neutro Abs: 4.5 10*3/uL (ref 1.4–7.7)
Neutrophils Relative %: 68.8 % (ref 43.0–77.0)
Platelets: 207 10*3/uL (ref 150.0–400.0)
RBC: 4.86 Mil/uL (ref 4.22–5.81)
RDW: 13.2 % (ref 11.5–15.5)
WBC: 6.5 10*3/uL (ref 4.0–10.5)

## 2023-08-09 LAB — COMPREHENSIVE METABOLIC PANEL
ALT: 14 U/L (ref 0–53)
AST: 23 U/L (ref 0–37)
Albumin: 4.7 g/dL (ref 3.5–5.2)
Alkaline Phosphatase: 63 U/L (ref 39–117)
BUN: 23 mg/dL (ref 6–23)
CO2: 30 meq/L (ref 19–32)
Calcium: 9.7 mg/dL (ref 8.4–10.5)
Chloride: 95 meq/L — ABNORMAL LOW (ref 96–112)
Creatinine, Ser: 1.1 mg/dL (ref 0.40–1.50)
GFR: 64.6 mL/min (ref >60.00)
Glucose, Bld: 203 mg/dL — ABNORMAL HIGH (ref 70–99)
Potassium: 3.9 meq/L (ref 3.5–5.1)
Sodium: 134 meq/L — ABNORMAL LOW (ref 135–145)
Total Bilirubin: 0.9 mg/dL (ref 0.2–1.2)
Total Protein: 6.9 g/dL (ref 6.0–8.3)

## 2023-08-09 NOTE — Progress Notes (Signed)
 HPI :  78 year old male here for a follow-up visit for hep C related cirrhosis, GERD.  Recall he has a history of renal neoplasm, on Eliquis  for history of A-fib.  Cirrhosis history: Hep C eradicated in 2015 at [facility], he followed up with them for several years and transitioned his care to us  in recent years.  He has done really well following eradication of hep C.  His cirrhosis is compensated.  Interestingly, he has multiple ultrasounds at [facility] in 2022 showing cirrhosis of the liver.  He had an elastography done in recent years with us  which showed low risk fibrotic change.  He has not had any decompensating events.  He takes Coreg  for his cardiovascular issues so we have not performed variceal screening.  SINCE LAST VISIT:  Patient been doing well since have last seen him.  He has had no issues with his liver disease or new decompensating events since have seen him.  He is feeling well at baseline.  He had labs last checked in July, of note his platelet count is in the 200s, liver enzymes are normal.  Last right upper quadrant ultrasound was in July, showed some fatty liver, no reported cirrhosis.   Recall he has a history of reflux, has been on omeprazole  in the past.  Currently takes it every other day which works pretty well to control symptoms.  Recall his last EGD showed no Barrett's esophagus.  He denies any dysphagia at this time.  He takes Pepcid  as needed for breakthrough and rarely Gaviscon if severe breakthrough.  We discussed long-term risks of chronic PPI and options to manage that.  Otherwise he does mention some discomfort along his right costal margin that has been present for the past 4 years or so.  He states he has had significant evaluation for this to include CT scan, ultrasound, has seen a few doctors for it.  No clear source of been found.  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  Pain is positional, tender to the touch.  Can radiate under  his costal margin to his epigastric area or back.  He initially felt it in yoga when he felt something pop.  The more he is active with that part of his body the worst his pain can be. He has been seen by pain management for his lower back, he is undergoing a piriformis injection at some point for that issue.  He had an MRI of his thoracic spine recently which looked okay.  He inquired about a trigger point injection with his pain management physician he declined.  We discussed options     Prior workup: EGD 10/2012: Dr. Luis - normal   Colonoscopy 10/2012: - Dr. Luis - diverticulosis, otherwise normal    Echo 10/11/18 - EF 50-55%   Exercise stress 07/08/21 - [facility]     Colonoscopy 09/09/2022: - The perianal and digital rectal examinations were normal. - The terminal ileum appeared normal. - A 3 mm polyp was found in the ascending colon. The polyp was sessile. The polyp was removed with a cold snare. Resection and retrieval were complete. - Three flat and sessile polyps were found in the hepatic flexure. The polyps were 2 to 4 mm in size. These polyps were removed with a cold snare. Resection and retrieval were complete. - A few small-mouthed diverticula were found in the sigmoid colon. - Internal hemorrhoids were found during retroflexion. The hemorrhoids were small. - The exam was  otherwise without abnormality.  Surgical [P], colon, hepatic flexure and ascending, polyp (4) SESSILE SERRATED POLYP WITHOUT CYTOLOGIC DYSPLASIA, 2 FRAGMENTS TUBULAR ADENOMA, 3 FRAGMENTS NEGATIVE FOR HIGH-GRADE DYSPLASIA AND CARCINOMA   CT C/A/P 01/17/2023: Hepatobiliary: No focal liver abnormality is seen. No gallstones, gallbladder wall thickening, or biliary dilatation.  IMPRESSION: 1. No acute findings within the chest, abdomen or pelvis. 2. Status post right nephrectomy. No specific findings identified to suggest residual or recurrent tumor or metastatic disease. 3. 4 mm right upper lobe lung nodule. This  is nonspecific. No prior imaging through the chest for comparison. Consider follow-up imaging in 12 months to confirm stability of this likely benign nodule. 4. Sigmoid diverticulosis without acute diverticulitis. 5. 4.1 cm ascending thoracic aortic aneurysm. Recommend annual imaging followup by CTA or MRA. This recommendation follows 2010 ACCF/AHA/AATS/ACR/ASA/SCA/SCAI/SIR/STS/SVM Guidelines for the Diagnosis and Management of Patients with Thoracic Aortic Disease. Circulation. 2010; 121: Z733-z630. Aortic aneurysm NOS (ICD10-I71.9) 6.  Aortic Atherosclerosis (ICD10-I70.0).    RUQ US  02/15/23: IMPRESSION: 1. No acute abnormality identified. 2. Mild increased echotexture of the liver, nonspecific but can be seen in fatty infiltration of liver.     Past Medical History:  Diagnosis Date   Ascending aortic aneurysm (HCC) 12/21/2015   MR angio chest: 06/2019. Diameter of 4.2cm. annual imaging.   Chest/Abd/Pelvic CTA 01/22/23: 4 mm RUL nodule, 4.1 cm ascending TAA, aortic atherosclerosis    Atrial fibrillation (HCC) 2019   Atrial tachycardia (HCC)    documented by holter 2016. Followed by Dr. K.Nelson and Dr. Kelsie.   Cancer of kidney (HCC)    right kidney-removed. no further tx other than surgery [facility].   Cirrhosis (HCC)    [facility] dx, early stage- follow up visits at [facility].   CRI (chronic renal insufficiency)    s/p R nephrectomy in 1999 for malignancy   Diabetes mellitus without complication (HCC)    NIDDM- dx. 3-4 years ago diet control   Diverticulosis of colon    mild   GERD (gastroesophageal reflux disease)    Headache(784.0)    Hepatitis C    s/p therapy [facility] (curative)/with Harvoni   History of BPH    no recent issues   History of substance abuse (HCC)    past history -none in 38 yrs   Hyperglycemia    Hyperlipemia    Hypertension    Osteoarthritis, knee    knees bilaterally    Pneumonia 1975   hx of 35 years ago     Past Surgical History:  Procedure Laterality  Date   Arthroscopy right knee     may '11 (Dr Ernie)   Arthroscopy, left knee,     ATRIAL FIBRILLATION ABLATION N/A 10/11/2018   Procedure: ATRIAL FIBRILLATION ABLATION;  Surgeon: Kelsie Agent, MD;  Location: [facility] INVASIVE CV [facility];  Service: Cardiovascular;  Laterality: N/A;   ATRIAL FIBRILLATION ABLATION N/A 06/19/2020   Procedure: ATRIAL FIBRILLATION ABLATION;  Surgeon: Kelsie Agent, MD;  Location: [facility] INVASIVE CV LAB;  Service: Cardiovascular;  Laterality: N/A;   CARDIAC ELECTROPHYSIOLOGY MAPPING AND ABLATION     history of liver biopsy  2007   History of Nephrectomy Right 1999   implantable loop recorder placement  03/19/2020    Medtronic Reveal Linq model LNQ 22 ([id number] ) implantable loop recorder implanted by Dr Kelsie for evaluation of palpitations and afib management post ablation   KNEE ARTHROSCOPY Left 10/10/2012   Procedure: LEFT KNEE ARTHROSCOPY WITH MENSCIAL DEBRIDEMENT AND CHONDROPLASTY;  Surgeon: Dempsey LULLA Moan, MD;  Location:  [facility];  Service: Orthopedics;  Laterality: Left;   left shoulder repair for bone spur  1990's   TEE WITHOUT CARDIOVERSION N/A 10/11/2018   Procedure: TRANSESOPHAGEAL ECHOCARDIOGRAM (TEE);  Surgeon: Lonni Slain, MD;  Location: [facility] ENDOSCOPY;  Service: Cardiovascular;  Laterality: N/A;  ablation to follow at 1030   TOTAL KNEE ARTHROPLASTY Right 08/15/2016   Procedure: RIGHT TOTAL KNEE ARTHROPLASTY, CORTISONE INJECTION OF LEFT KNEE;  Surgeon: Dempsey Moan, MD;  Location: [facility];  Service: Orthopedics;  Laterality: Right;   TOTAL KNEE ARTHROPLASTY Left 09/04/2017   Procedure: LEFT TOTAL KNEE ARTHROPLASTY;  Surgeon: Moan Dempsey, MD;  Location: [facility];  Service: Orthopedics;  Laterality: Left;   Transurethral needle ablation procedure     TUNA procedure     Family History  Problem Relation Age of Onset   Coronary artery disease Mother    CVA Mother    Alcohol abuse Father        variceal hemorrhage   Liver disease Father        alcohol  related   Alcohol abuse Sister    Diabetes Sister    Breast cancer Sister    Lung cancer Sister    Esophageal cancer Sister    Cancer Brother        head and neck   Alcohol abuse Other        Strong family Hx   Colon cancer Neg Hx    Pancreatic cancer Neg Hx    Stomach cancer Neg Hx    Rectal cancer Neg Hx    Social History   Tobacco Use   Smoking status: Former    Current packs/day: 0.00    Average packs/day: 1 pack/day for 15.0 years (15.0 ttl pk-yrs)    Types: Cigarettes    Start date: 08/01/1962    Quit date: 08/01/1977    Years since quitting: 46.0   Smokeless tobacco: Never  Vaping Use   Vaping status: Never Used  Substance Use Topics   Alcohol use: No    Comment: past history -none in 38 yrs   Drug use: No    Comment: past hx none in 38 yrs   Current Outpatient Medications  Medication Sig Dispense Refill   acetaminophen  (TYLENOL ) 650 MG CR tablet Take 650 mg by mouth See admin instructions. Take 650 mg at 11 am and take 650 mg at 4-5 am (take with gabapentin )     allopurinol  (ZYLOPRIM ) 100 MG tablet TAKE ONE TABLET BY MOUTH DAILY 90 tablet 3   Aloe-Sodium Chloride  (AYR SALINE NASAL GEL NA) Place 1 application into the nose at bedtime.     apixaban  (ELIQUIS ) 5 MG TABS tablet Take 1 tablet (5 mg total) by mouth 2 (two) times daily. 180 tablet 1   azithromycin (ZITHROMAX) 250 MG tablet Take by mouth daily.     carvedilol  (COREG ) 6.25 MG tablet Take 1 tablet (6.25 mg total) by mouth 2 (two) times daily. 180 tablet 3   Evolocumab  (REPATHA  SURECLICK) 140 MG/ML SOAJ INJECT CONTENTS OF 1 PEN SUBCUTANEOUSLY EVERY 14 DAYS. 6 mL 3   famotidine  (PEPCID ) 20 MG tablet Take 20 mg by mouth daily as needed for heartburn.      flecainide  (TAMBOCOR ) 100 MG tablet TAKE ONE TABLET BY MOUTH TWICE DAILY 180 tablet 3   gabapentin  (NEURONTIN ) 800 MG tablet TAKE ONE TABLET BY MOUTH TWICE DAILY 180 tablet 1   hydrochlorothiazide  (HYDRODIURIL ) 25 MG tablet TAKE ONE TABLET BY MOUTH DAILY 90  tablet 1  omeprazole  (PRILOSEC ) 20 MG capsule Take 20 mg by mouth every other day.      sodium chloride  (OCEAN) 0.65 % SOLN nasal spray Place 1 spray into both nostrils daily.     No current facility-administered medications for this visit.   Allergies  Allergen Reactions   Tramadol Hcl     Other reaction(s): ineffective   Acetaminophen  Other (See Comments)    Pt states he was told by [facility] MD's to keep tylenol  dosage to 2000 mg per 24 hours due to cirrhosis of liver.   Nsaids Other (See Comments)    Avoid due to having 1 kidney    Pravastatin  Other (See Comments)    Pt reports this med makes him generally feel unwell.     Review of Systems: All systems reviewed and negative except where noted in HPI.     Physical Exam: BP 130/68   Ht 6' (1.829 m)   Wt 207 lb 6.4 oz (94.1 kg)   BMI 28.13 kg/m  Constitutional: Pleasant,well-developed, male in no acute distress. Neurological: Alert and oriented to person place and time. Psychiatric: Normal mood and affect. Behavior is normal.   ASSESSMENT: 78 y.o. male here for assessment of the following  1. Cirrhosis of liver without ascites, unspecified hepatic cirrhosis type (HCC)   2. History of hepatitis C   3. Anticoagulated   4. Costal margin pain   5. Gastroesophageal reflux disease, unspecified whether esophagitis present   6. Long-term current use of proton pump inhibitor therapy    History of hep C that been eradicated.  He clearly has had evidence of cirrhosis on remote imaging however more recently his liver has not appeared cirrhotic.  His platelets are normal.  He is well compensated in regards to his liver disease.  We discussed this for a bit and risks for decompensation, risk for HCC.  Given his risk for [facility], recommend continued surveillance every 6 months with ultrasound.  He is agreeable with this.  We do not need to pursue EGD for variceal screening as long as he is on Coreg .  His platelets are normal, arguing against  any significant portal hypertension.  He is doing really well in this light.  We discussed his right sided costal margin pain for some time.  I agree with his impression that this is likely nerve impingement/musculoskeletal given his extensive evaluation in the past and weight describes his symptoms.  I think a trigger point would be reasonable with Kenalog injection.  We discussed what this is.  He will discuss further with his pain management physician, if they decline I offered him a trial of a trigger point injection in our office.  He will call me if he wants to proceed with that at some point time.  He can try some Voltaren gel to the area if he has not tried that yet in the interim.  We discussed his long-term use of PPI and risks associated with that.  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  Recommend lowest dose of PPI needed to control symptoms which he is doing.  He could try transitioning to Pepcid  monotherapy and see if that is enough to control him.  No history of Barrett's previously.   PLAN: - lab today - CBC, CMET, AFP (INR not reliable in setting of Eliquis ) - RUQ US  for HCC screening - f/u for trigger point injectoin PRN - trial of voltaren gel - continue omeprazole  or twice switching to pepcid  daily.  No BE historically. Discussed risks / benefits - f/u 6-12 months  Marcey Naval, MD [facility]

## 2023-08-09 NOTE — Patient Instructions (Addendum)
 You have been scheduled for an abdominal ultrasound at [facility] (1st floor of hospital) on Wednesday, 08-16-23 at 9:30am. Please arrive 30 minutes prior to your appointment for registration. Make certain not to have anything to eat or drink 6 hours prior to your appointment. Should you need to reschedule your appointment, please contact radiology at [phone]. This test typically takes about 30 minutes to perform.  Please go to the lab in the basement of our building to have lab work done as you leave today. Hit B for basement when you get on the elevator.  When the doors open the lab is on your left.  We will call you with the results. Thank you.  Please follow up in 6 months.   Thank you for entrusting me with your care and for choosing [facility], Dr. Elspeth Naval    If your blood pressure at your visit was 140/90 or greater, please contact your primary care physician to follow up on this. ______________________________________________________  If you are age 48 or older, your body mass index should be between 23-30. Your Body mass index is 28.13 kg/m. If this is out of the aforementioned range listed, please consider follow up with your Primary Care Provider.  If you are age 46 or younger, your body mass index should be between 19-25. Your Body mass index is 28.13 kg/m. If this is out of the aformentioned range listed, please consider follow up with your Primary Care Provider.  ________________________________________________________  The [facility] providers would like to encourage you to use MYCHART to communicate with providers for non-urgent requests or questions.  Due to long hold times on the telephone, sending your provider a message by [facility] may be a faster and more efficient way to get a response.  Please allow 48 business hours for a response.  Please remember that this is for non-urgent requests.   _______________________________________________________  Due to recent changes in healthcare laws, you may see the results of your imaging and laboratory studies on MyChart before your provider has had a chance to review them.  We understand that in some cases there may be results that are confusing or concerning to you. Not all laboratory results come back in the same time frame and the provider may be waiting for multiple results in order to interpret others.  Please give us  48 hours in order for your provider to thoroughly review all the results before contacting the office for clarification of your results.

## 2023-08-10 ENCOUNTER — Other Ambulatory Visit (HOSPITAL_COMMUNITY): Payer: Self-pay

## 2023-08-11 LAB — AFP TUMOR MARKER: AFP-Tumor Marker: 2.7 ng/mL (ref <6.1)

## 2023-08-14 DIAGNOSIS — M19022 Primary osteoarthritis, left elbow: Secondary | ICD-10-CM | POA: Diagnosis not present

## 2023-08-15 ENCOUNTER — Other Ambulatory Visit: Payer: Self-pay | Admitting: Family Medicine

## 2023-08-16 ENCOUNTER — Ambulatory Visit: Payer: Medicare Other | Admitting: Internal Medicine

## 2023-08-16 ENCOUNTER — Ambulatory Visit (HOSPITAL_COMMUNITY): Payer: Medicare Other

## 2023-08-21 ENCOUNTER — Encounter: Payer: Self-pay | Admitting: Gastroenterology

## 2023-08-21 ENCOUNTER — Ambulatory Visit (HOSPITAL_COMMUNITY)
Admission: RE | Admit: 2023-08-21 | Discharge: 2023-08-21 | Disposition: A | Discharge status: Home / Self Care | Payer: Medicare Other | Source: Ambulatory Visit | Attending: Gastroenterology | Admitting: Gastroenterology

## 2023-08-21 DIAGNOSIS — Z8619 Personal history of other infectious and parasitic diseases: Secondary | ICD-10-CM | POA: Diagnosis not present

## 2023-08-21 DIAGNOSIS — Z0389 Encounter for observation for other suspected diseases and conditions ruled out: Secondary | ICD-10-CM | POA: Diagnosis not present

## 2023-08-21 DIAGNOSIS — K7460 Unspecified cirrhosis of liver: Secondary | ICD-10-CM | POA: Insufficient documentation

## 2023-08-23 DIAGNOSIS — G5623 Lesion of ulnar nerve, bilateral upper limbs: Secondary | ICD-10-CM | POA: Insufficient documentation

## 2023-09-11 ENCOUNTER — Ambulatory Visit (INDEPENDENT_AMBULATORY_CARE_PROVIDER_SITE_OTHER): Payer: Medicare Other

## 2023-09-11 DIAGNOSIS — I480 Paroxysmal atrial fibrillation: Secondary | ICD-10-CM

## 2023-09-11 DIAGNOSIS — K080 Exfoliation of teeth due to systemic causes: Secondary | ICD-10-CM | POA: Diagnosis not present

## 2023-09-11 LAB — CUP PACEART REMOTE DEVICE CHECK
Date Time Interrogation Session: 20250209230529
Implantable Pulse Generator Implant Date: 20210819

## 2023-09-13 ENCOUNTER — Encounter: Payer: Self-pay | Admitting: Nurse Practitioner

## 2023-09-13 ENCOUNTER — Ambulatory Visit: Payer: Medicare Other | Admitting: Nurse Practitioner

## 2023-09-13 VITALS — BP 122/84 | HR 60 | Temp 98.1°F | Ht 72.0 in | Wt 209.1 lb

## 2023-09-13 DIAGNOSIS — E876 Hypokalemia: Secondary | ICD-10-CM

## 2023-09-13 DIAGNOSIS — E119 Type 2 diabetes mellitus without complications: Secondary | ICD-10-CM | POA: Diagnosis not present

## 2023-09-13 LAB — BASIC METABOLIC PANEL
BUN: 22 mg/dL (ref 6–23)
CO2: 30 meq/L (ref 19–32)
Calcium: 9.7 mg/dL (ref 8.4–10.5)
Chloride: 97 meq/L (ref 96–112)
Creatinine, Ser: 1.09 mg/dL (ref 0.40–1.50)
GFR: 65.27 mL/min (ref >60.00)
Glucose, Bld: 142 mg/dL — ABNORMAL HIGH (ref 70–99)
Potassium: 3.9 meq/L (ref 3.5–5.1)
Sodium: 135 meq/L (ref 135–145)

## 2023-09-13 LAB — HEMOGLOBIN A1C: Hgb A1c MFr Bld: 7.3 % — ABNORMAL HIGH (ref 4.6–6.5)

## 2023-09-13 NOTE — Assessment & Plan Note (Signed)
Chronic, stable Diet controlled Continue on Repatha, may consider ACE or ARB in the future.

## 2023-09-13 NOTE — Assessment & Plan Note (Signed)
Mild, asymptomatic Likely related to hydrochlorothiazide use.  Recheck BMP, further recommendations may be made based upon the results.

## 2023-09-13 NOTE — Progress Notes (Signed)
   Established Patient Office Visit  Subjective   Patient ID: Timothy Dickson, male    DOB: 07/08/1946  Age: 78 y.o. MRN: [id number]  Chief Complaint  Patient presents with   Diabetes    Patient has today transfer care to this office.  He was seeing a provider within [place] but had another location, he is transferring at this location because he reports it is closer to his home.  He has no specific acute concerns today.  He does have history of type 2 diabetes and this is currently diet controlled.  Last A1c checked in August, he would like to have this rechecked today.  I do see mild hyponatremia on last metabolic panel as well.  Overall he feels well and at baseline.    Review of Systems  Respiratory:  Negative for cough and wheezing.   Cardiovascular:  Negative for chest pain and palpitations.      Objective:     BP 122/84   Pulse 60   Temp 98.1 F (36.7 C) (Temporal)   Ht 6' (1.829 m)   Wt 209 lb 2 oz (94.9 kg)   SpO2 98%   BMI 28.36 kg/m    Physical Exam Vitals reviewed.  Constitutional:      Appearance: Normal appearance.  HENT:     Head: Normocephalic and atraumatic.  Cardiovascular:     Rate and Rhythm: Normal rate and regular rhythm.  Pulmonary:     Effort: Pulmonary effort is normal.     Breath sounds: Normal breath sounds.  Musculoskeletal:     Cervical back: Neck supple.  Skin:    General: Skin is warm and dry.  Neurological:     Mental Status: He is alert and oriented to person, place, and time.  Psychiatric:        Mood and Affect: Mood normal.        Behavior: Behavior normal.        Thought Content: Thought content normal.        Judgment: Judgment normal.      No results found for any visits on 09/13/23.    The ASCVD Risk score (Arnett DK, et al., 2019) failed to calculate for the following reasons:   The valid total cholesterol range is 130 to 320 mg/dL    Assessment & Plan:   Problem List Items Addressed This Visit        Endocrine   Controlled type 2 diabetes mellitus without complication, without long-term current use of insulin (HCC) - Primary   Chronic, stable Diet controlled Continue on Repatha, may consider ACE or ARB in the future.      Relevant Orders   Hemoglobin A1c   Basic metabolic panel     Other   Hypokalemia   Mild, asymptomatic Likely related to hydrochlorothiazide use.  Recheck BMP, further recommendations may be made based upon the results.       Return in about 7 months (around 04/12/2024) for CPE with Maralyn Sago.    Elenore Paddy, NP

## 2023-09-13 NOTE — Progress Notes (Signed)
Carelink Summary Report / Loop Recorder

## 2023-09-17 ENCOUNTER — Encounter: Payer: Self-pay | Admitting: Internal Medicine

## 2023-09-18 ENCOUNTER — Encounter: Payer: Self-pay | Admitting: Internal Medicine

## 2023-09-20 ENCOUNTER — Telehealth: Payer: Self-pay | Admitting: *Deleted

## 2023-09-20 NOTE — Telephone Encounter (Signed)
Patient with diagnosis of Afib  on Eliquis for anticoagulation.    Procedure: RELEASE CUBITAL TUNNEL ELBOW ANTERIOR ULNAR NERVE TRANSPOSITION, REVISION  Date of procedure: TBD   CHA2DS2-VASc Score = 5   This indicates a 7.2% annual risk of stroke. The patient's score is based upon: CHF History: 0 HTN History: 1 Diabetes History: 1 Stroke History: 0 Vascular Disease History: 1 Age Score: 2 Gender Score: 0     CrCl 76 mL/min Platelet count 207 K    Per office protocol, patient can hold Eliquis for 2 days prior to procedure.     **This guidance is not considered finalized until pre-operative APP has relayed final recommendations.**

## 2023-09-20 NOTE — Telephone Encounter (Signed)
   Name: Timothy Dickson  DOB: 1945/09/26  MRN: [id number]  Primary Cardiologist: Meriam Sprague, MD (Inactive)  Chart reviewed as part of pre-operative protocol coverage. Because of [name] past medical history and time since last visit, he will require a follow-up in-office visit in order to better assess preoperative cardiovascular risk.  Patient has an office visit scheduled on 09/26/2023 with [name], PA-C. Appointment notes have been updated to reflect need for pre-op evaluation.   Pre-op covering staff:  - Please contact requesting surgeon's office via preferred method (i.e, phone, fax) to inform them of need for appointment prior to surgery.  This message will also be routed to pharmacy pool for input on holding Eliquis as requested below so that this information is available to the clearing provider at time of patient's appointment.   Carlos Levering, NP  09/20/2023, 2:03 PM

## 2023-09-20 NOTE — Telephone Encounter (Signed)
Please advise holding Eliquis prior to release cubital tunnel elbow anterior ulnar nerve transposition, revision.  Patient is scheduled for an office visit 09/26/2023.  Thank you!  DW

## 2023-09-20 NOTE — Telephone Encounter (Signed)
   Pre-operative Risk Assessment    Patient Name: Timothy Dickson  DOB: 07-27-1946 MRN: [id number]   Date of last office visit: 05/02/23 DR. CHANDRASEKHAR Date of next office visit: 09/26/23 [name], [facility]   Request for Surgical Clearance    Procedure:   RELEASE CUBITAL TUNNEL ELBOW ANTERIOR ULNAR NERVE TRANSPOSITION, REVISION  Date of Surgery:  Clearance TBD                                Surgeon:  DR. Melina Fiddler Surgeon's Group or Practice Name:  [facility] Phone number:  [phone] Fax number:  [phone]   Type of Clearance Requested:   - Medical  - Pharmacy:  Hold Apixaban (Eliquis)     Type of Anesthesia:   BIER BLOCK    Additional requests/questions:    [name]   09/20/2023, 12:59 PM

## 2023-09-21 NOTE — Telephone Encounter (Signed)
Will update all parties involved pt has appt 09/26/23 with [name], PAC.

## 2023-09-24 ENCOUNTER — Encounter: Payer: Self-pay | Admitting: Nurse Practitioner

## 2023-09-26 ENCOUNTER — Ambulatory Visit: Payer: Medicare Other | Attending: Physician Assistant | Admitting: Physician Assistant

## 2023-09-26 ENCOUNTER — Encounter: Payer: Self-pay | Admitting: Physician Assistant

## 2023-09-26 VITALS — BP 110/70 | HR 64 | Ht 72.0 in | Wt 209.6 lb

## 2023-09-26 DIAGNOSIS — E782 Mixed hyperlipidemia: Secondary | ICD-10-CM

## 2023-09-26 DIAGNOSIS — Z01810 Encounter for preprocedural cardiovascular examination: Secondary | ICD-10-CM

## 2023-09-26 DIAGNOSIS — I480 Paroxysmal atrial fibrillation: Secondary | ICD-10-CM | POA: Diagnosis not present

## 2023-09-26 DIAGNOSIS — I2510 Atherosclerotic heart disease of native coronary artery without angina pectoris: Secondary | ICD-10-CM

## 2023-09-26 DIAGNOSIS — I10 Essential (primary) hypertension: Secondary | ICD-10-CM

## 2023-09-26 DIAGNOSIS — R911 Solitary pulmonary nodule: Secondary | ICD-10-CM

## 2023-09-26 DIAGNOSIS — I7121 Aneurysm of the ascending aorta, without rupture: Secondary | ICD-10-CM

## 2023-09-26 NOTE — Assessment & Plan Note (Signed)
 Ascending thoracic aortic aneurysm measuring 4.1 cm by CT in June 2024.  - Follow-up CT scan pending in June 2025.

## 2023-09-26 NOTE — Progress Notes (Signed)
 Cardiology Office Note:    Date:  09/26/2023  ID:  Timothy Dickson, DOB 29-Dec-1945, MRN [id number] PCP: Elenore Paddy, NP   [facility]Care Providers Cardiologist:  Christell Constant, MD Electrophysiologist:  Hillis Range, MD (Inactive) 2       Patient Profile:      Coronary artery calcification CAC score 01/04/2016: CAC score 139 (47th percentile); ascending aorta 44 mm Cardiac MRI 02/19/2017: EF 56, inferolateral HK, inferolateral LGE consistent with prior infarction, RVEF 61, mildly dilated aortic root (43 mm), mildly dilated ascending aorta (42 mm), mildly dilated pulmonary artery Myoview 03/02/2017: EF 57, normal perfusion, low risk ETT 04/17/2018: Negative adequate exercise tolerance test Persistent atrial fibrillation S/p PVI ablation 09/2018 (Dr. Johney Frame) // S/p PVI and CTI ablation 06/2020 (Dr. Johney Frame) S/p ILR  Flecainide Rx TTE 03/23/2018: EF 60-65, no RWMA, GR 1 DD, mild LAE, normal RVSF, mild TR TEE 10/12/2018: EF 50-55, no RWMA, normal RVSF, mild BAE Supraventricular tachycardia Hypertension Ascending thoracic aortic aneurysm Cardiac CT/pulmonary vein angiogram 06/15/2020: Ascending aorta 41 mm Chest MRI 04/08/2022: Ascending aorta 43 mm CT 01/17/23: ascending thoracic aorta 4.1 cm  Hyperlipidemia Repatha Rx  Renal CA s/p right nephrectomy Chronic kidney disease  Diabetes mellitus Carotid US 05/07/2012: Bilateral ICA 0-39 Hepatitis C Cirrhosis Prior hx of substance abuse  RUL Lung nodule CT 12/2022 >> Repeat due in 1 year           Discussed the use of AI scribe software for clinical note transcription with the patient, who gave verbal consent to proceed. History of Present Illness Timothy Dickson is a 78 y.o. male who returns for surgical clearance. He was last seen by Dr. Izora Ribas in 05/2023. He needs a cubital tunnel release with Dr. Jalene Mullet with regional block. Eliquis needs to be held and our PharmD has reviewed his chart with recommendations to hold this  for 2 days prior to his surgery.   He is here alone. No current chest pain, shortness of breath, or other cardiac symptoms, even after physical exertion such as unloading mulch, which he did all day today. He has not had shortness of breath, syncope, or leg swelling. No episodes of atrial fibrillation, bleeding, black stools, or bloody urine. He is not diabetic and has never had a stroke.  Review of Systems  Gastrointestinal:  Negative for hematochezia and melena.  Genitourinary:  Negative for hematuria.  -See HPI     Studies Reviewed:   EKG Interpretation Date/Time:  Tuesday September 26 2023 15:52:11 EST Ventricular Rate:  64 PR Interval:  190 QRS Duration:  104 QT Interval:  420 QTC Calculation: 433 R Axis:   -73  Text Interpretation: Normal sinus rhythm Left axis deviation Incomplete right bundle branch block Nonspecific T wave abnormality No significant change since last tracing Confirmed by [name] [phone]) on 09/26/2023 3:56:35 PM     Results LABS - Chart Review Total Cholesterol: 108 (03/30/2023) HDL: 43.9 (03/30/2023) LDL: 38 (03/30/2023) Triglycerides: 132 (03/30/2023) Creatinine: 1.09 (07/12/2024) Potassium: 3.9 (07/12/2024)   Risk Assessment/Calculations:    CHA2DS2-VASc Score = 5   This indicates a 7.2% annual risk of stroke. The patient's score is based upon: CHF History: 0 HTN History: 1 Diabetes History: 1 Stroke History: 0 Vascular Disease History: 1 Age Score: 2 Gender Score: 0            Physical Exam:   VS:  BP 110/70   Pulse 64   Ht 6' (1.829 m)   Wt 209 lb  9.6 oz (95.1 kg)   SpO2 97%   BMI 28.43 kg/m    Wt Readings from Last 3 Encounters:  09/26/23 209 lb 9.6 oz (95.1 kg)  09/13/23 209 lb 2 oz (94.9 kg)  08/09/23 207 lb 6.4 oz (94.1 kg)    Constitutional:      Appearance: Healthy appearance. Not in distress.  Neck:     Vascular: JVD normal.  Pulmonary:     Breath sounds: Normal breath sounds. No wheezing. No rales.   Cardiovascular:     Normal rate. Regular rhythm.     Murmurs: There is no murmur.  Edema:    Peripheral edema absent.  Abdominal:     Palpations: Abdomen is soft.      Assessment and Plan:   Assessment & Plan Preoperative cardiovascular examination Timothy Dickson's perioperative risk of a major cardiac event is 0.4% according to the Revised Cardiac Risk Index (RCRI).  Therefore, he is at low risk for perioperative complications.   His functional capacity is good at 3.97 METs according to the [facility] Activity Status Index (DASI). Recommendations:  According to ACC/AHA guidelines, no further cardiovascular testing needed.  The patient may proceed to surgery at acceptable risk.   Antiplatelet and/or Anticoagulation Recommendations:  Eliquis (Apixaban) can be held for 2 days prior to surgery.  Please resume post op when felt to be safe.   Paroxysmal atrial fibrillation (HCC) Status post ablation in 2020 and 2021. Maintaining NSR. Controlled on flecainide with no episodes in the past two years. QRS on EKG stable. No bleeding, melena, or hematuria reported.   - Continue Eliquis 5 mg twice daily - Continue carvedilol 6.25 mg twice daily - Continue flecainide 100 mg twice daily Coronary artery calcification seen on CT scan CAC score of 139 (47th percentile) from June 2017. Cardiac MRI in July 2018 showed EF 56% with inferolateral hypokinesis and LGE consistent with prior infarction. Nuclear stress test in August 2018 showed normal perfusion and EF 57%, indicating low risk. He is currently not having chest pain or symptoms suggestive of cardiac ischemia. He has had some rib pain in the past that seems to be mostly c/w MSK chest pain. He has been active all day today without chest pain, however. He is not on antiplatelet Rx as he is on Eliquis. - Continue Repatha 140 mg every two weeks Essential hypertension Blood pressure well-controlled with current medication regimen.   - Continue carvedilol 6.25 mg  twice daily - Continue hydrochlorothiazide 25 mg daily Mixed hyperlipidemia LDL optimal. - Continue Repatha 140 mg every two weeks Aneurysm of ascending aorta without rupture (HCC) Ascending thoracic aortic aneurysm measuring 4.1 cm by CT in June 2024.  - Follow-up CT scan pending in June 2025. Lung nodule Lung nodule identified by CT scan in June 2024. . - Follow-up CT scan in June 2025      Dispo:  Return in about 6 months (around 03/25/2024) for Routine Follow Up, w/ Dr. Izora Ribas.  Signed, [name], PA-C

## 2023-09-26 NOTE — Telephone Encounter (Signed)
 Note faxed to surgeon. [name], PA-C    09/26/2023 5:18 PM

## 2023-09-26 NOTE — Patient Instructions (Addendum)
 Medication Instructions:  Your physician recommends that you continue on your current medications as directed. Please refer to the Current Medication list given to you today.  *If you need a refill on your cardiac medications before your next appointment, please call your pharmacy*   Lab Work: None ordered  If you have labs (blood work) drawn today and your tests are completely normal, you will receive your results only by: MyChart Message (if you have MyChart) OR A paper copy in the mail If you have any lab test that is abnormal or we need to change your treatment, we will call you to review the results.   Testing/Procedures: Non-Cardiac CT Angiography (CTA), is a special type of CT scan that uses a computer to produce multi-dimensional views of major blood vessels throughout the body. In CT angiography, a contrast material is injected through an IV to help visualize the blood vessels    Follow-Up: At [facility], you and your health needs are our priority.  As part of our continuing mission to provide you with exceptional heart care, we have created designated Provider Care Teams.  These Care Teams include your primary Cardiologist (physician) and Advanced Practice Providers (APPs -  Physician Assistants and Nurse Practitioners) who all work together to provide you with the care you need, when you need it.  We recommend signing up for the patient portal called "MyChart".  Sign up information is provided on this After Visit Summary.  MyChart is used to connect with patients for Virtual Visits (Telemedicine).  Patients are able to view lab/test results, encounter notes, upcoming appointments, etc.  Non-urgent messages can be sent to your provider as well.   To learn more about what you can do with MyChart, go to [email or web address].    Your next appointment:   6 month(s)  Provider:   Dr. Izora Ribas    Other Instructions    1st Floor: - Lobby - Registration  -  Pharmacy  - Lab - Cafe  2nd Floor: - PV Lab - Diagnostic Testing (echo, CT, nuclear med)  3rd Floor: - Vacant  4th Floor: - TCTS (cardiothoracic surgery) - AFib Clinic - Structural Heart Clinic - Vascular Surgery  - Vascular Ultrasound  5th Floor: - HeartCare Cardiology (general and EP) - Clinical Pharmacy for coumadin, hypertension, lipid, weight-loss medications, and med management appointments    Valet parking services will be available as well.

## 2023-09-26 NOTE — Assessment & Plan Note (Signed)
 Blood pressure well-controlled with current medication regimen.   - Continue carvedilol 6.25 mg twice daily - Continue hydrochlorothiazide 25 mg daily

## 2023-09-26 NOTE — Assessment & Plan Note (Signed)
 CAC score of 139 (47th percentile) from June 2017. Cardiac MRI in July 2018 showed EF 56% with inferolateral hypokinesis and LGE consistent with prior infarction. Nuclear stress test in August 2018 showed normal perfusion and EF 57%, indicating low risk. He is currently not having chest pain or symptoms suggestive of cardiac ischemia. He has had some rib pain in the past that seems to be mostly c/w MSK chest pain. He has been active all day today without chest pain, however. He is not on antiplatelet Rx as he is on Eliquis. - Continue Repatha 140 mg every two weeks

## 2023-09-28 DIAGNOSIS — Z9889 Other specified postprocedural states: Secondary | ICD-10-CM | POA: Diagnosis not present

## 2023-09-28 DIAGNOSIS — I4719 Other supraventricular tachycardia: Secondary | ICD-10-CM | POA: Diagnosis not present

## 2023-09-28 DIAGNOSIS — Z885 Allergy status to narcotic agent status: Secondary | ICD-10-CM | POA: Diagnosis not present

## 2023-09-28 DIAGNOSIS — I7121 Aneurysm of the ascending aorta, without rupture: Secondary | ICD-10-CM | POA: Diagnosis not present

## 2023-09-28 DIAGNOSIS — I4819 Other persistent atrial fibrillation: Secondary | ICD-10-CM | POA: Diagnosis not present

## 2023-09-28 DIAGNOSIS — Z905 Acquired absence of kidney: Secondary | ICD-10-CM | POA: Diagnosis not present

## 2023-09-28 DIAGNOSIS — Z886 Allergy status to analgesic agent status: Secondary | ICD-10-CM | POA: Diagnosis not present

## 2023-09-28 DIAGNOSIS — G5622 Lesion of ulnar nerve, left upper limb: Secondary | ICD-10-CM | POA: Diagnosis not present

## 2023-09-28 DIAGNOSIS — E119 Type 2 diabetes mellitus without complications: Secondary | ICD-10-CM | POA: Diagnosis not present

## 2023-09-28 DIAGNOSIS — R040 Epistaxis: Secondary | ICD-10-CM | POA: Diagnosis not present

## 2023-09-28 DIAGNOSIS — Z7901 Long term (current) use of anticoagulants: Secondary | ICD-10-CM | POA: Diagnosis not present

## 2023-09-28 DIAGNOSIS — Z79899 Other long term (current) drug therapy: Secondary | ICD-10-CM | POA: Diagnosis not present

## 2023-09-28 DIAGNOSIS — I10 Essential (primary) hypertension: Secondary | ICD-10-CM | POA: Diagnosis not present

## 2023-09-28 DIAGNOSIS — I351 Nonrheumatic aortic (valve) insufficiency: Secondary | ICD-10-CM | POA: Diagnosis not present

## 2023-09-28 DIAGNOSIS — Z888 Allergy status to other drugs, medicaments and biological substances status: Secondary | ICD-10-CM | POA: Diagnosis not present

## 2023-10-12 DIAGNOSIS — R29898 Other symptoms and signs involving the musculoskeletal system: Secondary | ICD-10-CM | POA: Diagnosis not present

## 2023-10-12 DIAGNOSIS — Z9889 Other specified postprocedural states: Secondary | ICD-10-CM | POA: Diagnosis not present

## 2023-10-12 DIAGNOSIS — Z789 Other specified health status: Secondary | ICD-10-CM | POA: Insufficient documentation

## 2023-10-12 DIAGNOSIS — Z4889 Encounter for other specified surgical aftercare: Secondary | ICD-10-CM | POA: Diagnosis not present

## 2023-10-12 DIAGNOSIS — G5623 Lesion of ulnar nerve, bilateral upper limbs: Secondary | ICD-10-CM | POA: Diagnosis not present

## 2023-10-16 ENCOUNTER — Ambulatory Visit: Payer: Medicare Other

## 2023-10-16 DIAGNOSIS — I480 Paroxysmal atrial fibrillation: Secondary | ICD-10-CM

## 2023-10-16 NOTE — Progress Notes (Signed)
 Carelink Summary Report / Loop Recorder

## 2023-10-17 LAB — CUP PACEART REMOTE DEVICE CHECK
Date Time Interrogation Session: 20250316230325
Implantable Pulse Generator Implant Date: 20210819

## 2023-10-18 ENCOUNTER — Encounter: Payer: Self-pay | Admitting: Internal Medicine

## 2023-10-18 DIAGNOSIS — K080 Exfoliation of teeth due to systemic causes: Secondary | ICD-10-CM | POA: Diagnosis not present

## 2023-10-23 DIAGNOSIS — Z9889 Other specified postprocedural states: Secondary | ICD-10-CM | POA: Diagnosis not present

## 2023-10-23 DIAGNOSIS — R29898 Other symptoms and signs involving the musculoskeletal system: Secondary | ICD-10-CM | POA: Diagnosis not present

## 2023-10-23 DIAGNOSIS — Z789 Other specified health status: Secondary | ICD-10-CM | POA: Diagnosis not present

## 2023-10-23 DIAGNOSIS — G5623 Lesion of ulnar nerve, bilateral upper limbs: Secondary | ICD-10-CM | POA: Diagnosis not present

## 2023-10-23 DIAGNOSIS — Z4889 Encounter for other specified surgical aftercare: Secondary | ICD-10-CM | POA: Diagnosis not present

## 2023-10-24 ENCOUNTER — Encounter: Payer: Self-pay | Admitting: Nurse Practitioner

## 2023-10-25 ENCOUNTER — Other Ambulatory Visit: Payer: Self-pay | Admitting: Family Medicine

## 2023-10-26 ENCOUNTER — Other Ambulatory Visit: Payer: Self-pay | Admitting: Nurse Practitioner

## 2023-10-26 DIAGNOSIS — G629 Polyneuropathy, unspecified: Secondary | ICD-10-CM

## 2023-10-26 DIAGNOSIS — M1000 Idiopathic gout, unspecified site: Secondary | ICD-10-CM

## 2023-10-26 MED ORDER — GABAPENTIN 800 MG PO TABS
800.0000 mg | ORAL_TABLET | Freq: Two times a day (BID) | ORAL | 1 refills | Status: DC
  Filled 2024-01-30 – 2024-02-21 (×2): qty 180, 90d supply, fill #0
  Filled 2024-05-17: qty 180, 90d supply, fill #1

## 2023-10-26 MED ORDER — ALLOPURINOL 100 MG PO TABS
100.0000 mg | ORAL_TABLET | Freq: Every day | ORAL | 1 refills | Status: AC
  Filled 2024-01-30 – 2024-04-21 (×2): qty 90, 90d supply, fill #0
  Filled 2024-07-12: qty 90, 90d supply, fill #1

## 2023-11-01 ENCOUNTER — Other Ambulatory Visit (HOSPITAL_COMMUNITY): Payer: Self-pay

## 2023-11-01 ENCOUNTER — Other Ambulatory Visit: Payer: Self-pay

## 2023-11-02 ENCOUNTER — Other Ambulatory Visit (HOSPITAL_COMMUNITY): Payer: Self-pay

## 2023-11-02 DIAGNOSIS — H524 Presbyopia: Secondary | ICD-10-CM | POA: Diagnosis not present

## 2023-11-02 LAB — HM DIABETES EYE EXAM

## 2023-11-03 ENCOUNTER — Encounter: Payer: Self-pay | Admitting: Internal Medicine

## 2023-11-03 ENCOUNTER — Other Ambulatory Visit (HOSPITAL_COMMUNITY): Payer: Self-pay

## 2023-11-03 DIAGNOSIS — E1169 Type 2 diabetes mellitus with other specified complication: Secondary | ICD-10-CM

## 2023-11-03 MED ORDER — REPATHA SURECLICK 140 MG/ML ~~LOC~~ SOAJ
140.0000 mg | SUBCUTANEOUS | 3 refills | Status: AC
  Filled 2023-11-03: qty 6, 84d supply, fill #0
  Filled 2024-01-25: qty 6, 84d supply, fill #1
  Filled 2024-04-15: qty 6, 84d supply, fill #2
  Filled 2024-07-12: qty 6, 84d supply, fill #3

## 2023-11-06 DIAGNOSIS — Z4889 Encounter for other specified surgical aftercare: Secondary | ICD-10-CM | POA: Diagnosis not present

## 2023-11-06 DIAGNOSIS — Z789 Other specified health status: Secondary | ICD-10-CM | POA: Diagnosis not present

## 2023-11-06 DIAGNOSIS — G5623 Lesion of ulnar nerve, bilateral upper limbs: Secondary | ICD-10-CM | POA: Diagnosis not present

## 2023-11-06 DIAGNOSIS — R29898 Other symptoms and signs involving the musculoskeletal system: Secondary | ICD-10-CM | POA: Diagnosis not present

## 2023-11-06 DIAGNOSIS — Z9889 Other specified postprocedural states: Secondary | ICD-10-CM | POA: Diagnosis not present

## 2023-11-08 ENCOUNTER — Other Ambulatory Visit: Payer: Self-pay | Admitting: Internal Medicine

## 2023-11-20 ENCOUNTER — Ambulatory Visit: Payer: Medicare Other

## 2023-11-20 DIAGNOSIS — I4819 Other persistent atrial fibrillation: Secondary | ICD-10-CM

## 2023-11-21 LAB — CUP PACEART REMOTE DEVICE CHECK
Date Time Interrogation Session: 20250420230246
Implantable Pulse Generator Implant Date: 20210819

## 2023-11-22 ENCOUNTER — Encounter: Payer: Self-pay | Admitting: Internal Medicine

## 2023-11-24 ENCOUNTER — Telehealth: Payer: Self-pay

## 2023-11-24 NOTE — Telephone Encounter (Signed)
 ILR @ RRT   ILR reached RRT:  Patient called, discussed options to leave device in or explanted.  Patient would like to Explant and hopefully reimplant    Advised if further questions arise to please call the device clinic at [phone].   Spoke w/ Patient. He would like to talk to Dr. Carolynne Citron about explant and hopefully replacing it for peace of mind.

## 2023-11-28 DIAGNOSIS — H10502 Unspecified blepharoconjunctivitis, left eye: Secondary | ICD-10-CM | POA: Diagnosis not present

## 2023-11-30 NOTE — Addendum Note (Signed)
 Addended by: Edra Govern D on: 11/30/2023 11:57 AM   Modules accepted: Orders

## 2023-11-30 NOTE — Progress Notes (Signed)
 Carelink Summary Report / Loop Recorder

## 2024-01-05 NOTE — Addendum Note (Signed)
 Addended by: Edra Govern D on: 01/05/2024 03:38 PM   Modules accepted: Orders

## 2024-01-05 NOTE — Progress Notes (Signed)
 Carelink Summary Report / Loop Recorder

## 2024-01-17 ENCOUNTER — Ambulatory Visit (HOSPITAL_COMMUNITY)
Admission: RE | Admit: 2024-01-17 | Discharge: 2024-01-17 | Disposition: A | Discharge status: Home / Self Care | Payer: Medicare Other | Source: Ambulatory Visit | Attending: Physician Assistant | Admitting: Physician Assistant

## 2024-01-17 DIAGNOSIS — N281 Cyst of kidney, acquired: Secondary | ICD-10-CM | POA: Diagnosis not present

## 2024-01-17 DIAGNOSIS — I7121 Aneurysm of the ascending aorta, without rupture: Secondary | ICD-10-CM | POA: Insufficient documentation

## 2024-01-17 DIAGNOSIS — I2510 Atherosclerotic heart disease of native coronary artery without angina pectoris: Secondary | ICD-10-CM | POA: Diagnosis not present

## 2024-01-17 LAB — POCT I-STAT CREATININE: Creatinine, Ser: 1.1 mg/dL (ref 0.61–1.24)

## 2024-01-17 MED ORDER — SODIUM CHLORIDE (PF) 0.9 % IJ SOLN
INTRAMUSCULAR | Status: AC
Start: 2024-01-17 — End: 2024-01-17
  Filled 2024-01-17: qty 50

## 2024-01-17 MED ORDER — IOHEXOL 350 MG/ML SOLN
75.0000 mL | Freq: Once | INTRAVENOUS | Status: AC | PRN
  Administered 2024-01-17: 75 mL via INTRAVENOUS

## 2024-01-18 ENCOUNTER — Ambulatory Visit: Payer: Self-pay | Admitting: Physician Assistant

## 2024-01-19 ENCOUNTER — Encounter: Payer: Self-pay | Admitting: Internal Medicine

## 2024-01-19 ENCOUNTER — Ambulatory Visit: Attending: Cardiology | Admitting: Internal Medicine

## 2024-01-19 ENCOUNTER — Other Ambulatory Visit (HOSPITAL_COMMUNITY): Payer: Self-pay

## 2024-01-19 VITALS — BP 118/70 | HR 63 | Ht 72.0 in | Wt 205.0 lb

## 2024-01-19 DIAGNOSIS — I480 Paroxysmal atrial fibrillation: Secondary | ICD-10-CM | POA: Diagnosis not present

## 2024-01-19 NOTE — Patient Instructions (Signed)
 Medication Instructions:  Your physician recommends that you continue on your current medications as directed. Please refer to the Current Medication list given to you today.  *If you need a refill on your cardiac medications before your next appointment, please call your pharmacy*  Lab Work: None ordered.  You may go to any [facility] Location for your lab work:  [facility] - [address] ([facility]) - [address] [id number]  [place] - [address]   [place]  - [address]   [place] - [address] - [address] [employer]  - [address] ([facility]   If you have labs (blood work) drawn today and your tests are completely normal, you will receive your results only by: [employer] (if you have MyChart)  If you have any lab test that is abnormal or we need to change your treatment, we will call you or send a MyChart message to review the results.  Testing/Procedures: None ordered.  Follow-Up: At [facility], you and your health needs are our priority.  As part of our continuing mission to provide you with exceptional heart care, we have created designated Provider Care Teams.  These Care Teams include your primary Cardiologist (physician) and Advanced Practice Providers (APPs -  Physician Assistants and Nurse Practitioners) who all work together to provide you with the care you need, when you need it.  Your next appointment:   1 year(s)  The format for your next appointment:   In Person  Provider:   Manya Sells, MD{or one of the following Advanced Practice Providers on your designated Care Team:   Mertha Abrahams, [place] Bambi Lever "Jonelle Neri" [place], [place] Neda Balk, NP  Note: Remote monitoring is used to monitor your Pacemaker/ ICD from home. This monitoring reduces the number of office visits required to check your  device to one time per year. It allows us  to keep an eye on the functioning of your device to ensure it is working properly.

## 2024-01-19 NOTE — Progress Notes (Signed)
 HPI Mr. Timothy Dickson returns today for followup. He is a pleasant 78 yo man with persistent atrial fib. He has a h/o HTN, DM, s/p ablation. He had recurrent atrial fib requiring that he go back on flecainide  but since then he has been stable. He has been on eliquis  without difficulty/bleeding.Review of his ILR demonstrates 99.9% NSR. He has not had syncope and denies chest pain or sob. He does feel his atrial fib.  Allergies  Allergen Reactions   Tramadol    Ibuprofen      Other Reaction(s): Not available  ibuprofen    Tramadol Hcl     Other reaction(s): ineffective   Acetaminophen  Other (See Comments)    Pt states he was told by [facility]'s to keep tylenol  dosage to 2000 mg per 24 hours due to cirrhosis of liver.  Tylenol    Nsaids Other (See Comments)    Avoid due to having 1 kidney    Pravastatin  Other (See Comments)    Pt reports this med makes him generally feel unwell.     Current Outpatient Medications  Medication Sig Dispense Refill   acetaminophen  (TYLENOL ) 650 MG CR tablet Take 650 mg by mouth See admin instructions. Take 650 mg at 11 am and take 650 mg at 4-5 am (take with gabapentin )     allopurinol  (ZYLOPRIM ) 100 MG tablet Take 1 tablet (100 mg total) by mouth daily. 90 tablet 1   Aloe-Sodium Chloride  (AYR SALINE NASAL GEL NA) Place 1 application into the nose at bedtime.     apixaban  (ELIQUIS ) 5 MG TABS tablet Take 1 tablet (5 mg total) by mouth 2 (two) times daily. 180 tablet 1   carvedilol  (COREG ) 6.25 MG tablet Take 1 tablet (6.25 mg total) by mouth 2 (two) times daily. 180 tablet 3   Evolocumab  (REPATHA  SURECLICK) 140 MG/ML SOAJ Inject 140 mg (contents of one pen) into the skin every 14 (fourteen) days. 6 mL 3   famotidine  (PEPCID ) 20 MG tablet Take 20 mg by mouth daily as needed for heartburn.      flecainide  (TAMBOCOR ) 100 MG tablet TAKE ONE TABLET BY MOUTH TWICE DAILY 180 tablet 3   gabapentin  (NEURONTIN ) 800 MG tablet Take 1 tablet (800 mg total) by mouth 2 (two)  times daily. 180 tablet 1   hydrochlorothiazide  (HYDRODIURIL ) 25 MG tablet TAKE ONE TABLET BY MOUTH DAILY 90 tablet 3   omeprazole  (PRILOSEC ) 20 MG capsule Take 20 mg by mouth every other day.      sodium chloride  (OCEAN) 0.65 % SOLN nasal spray Place 1 spray into both nostrils daily.     No current facility-administered medications for this visit.     Past Medical History:  Diagnosis Date   Ascending aortic aneurysm (HCC) 12/21/2015   MR angio chest: 06/2019. Diameter of 4.2cm. annual imaging.   Chest/Abd/Pelvic CTA 01/22/23: 4 mm RUL nodule, 4.1 cm ascending TAA, aortic atherosclerosis    Atrial fibrillation (HCC) 2019   Atrial tachycardia (HCC)    documented by holter 2016. Followed by Dr. K.Nelson and Dr. Nunzio Belch.   Cancer of kidney (HCC)    right kidney-removed. no further tx other than surgery [facility].   Cirrhosis (HCC)    [facility] dx, early stage- follow up visits at [facility].   CRI (chronic renal insufficiency)    s/p R nephrectomy in 1999 for malignancy   Diabetes mellitus without complication (HCC)    NIDDM- dx. 3-4 years ago diet control   Diverticulosis of colon  mild   GERD (gastroesophageal reflux disease)    Headache(784.0)    Hepatitis C    s/p therapy [facility] (curative)/with Harvoni   History of BPH    no recent issues   History of substance abuse (HCC)    past history -none in 38 yrs   Hyperglycemia    Hyperlipemia    Hypertension    Implantable loop recorder present    Osteoarthritis, knee    knees bilaterally    Pneumonia 1975   hx of 35 years ago    ROS:   All systems reviewed and negative except as noted in the HPI.   Past Surgical History:  Procedure Laterality Date   Arthroscopy right knee     may '11 (Dr Bernard Brick)   Arthroscopy, left knee,     ATRIAL FIBRILLATION ABLATION N/A 10/11/2018   Procedure: ATRIAL FIBRILLATION ABLATION;  Surgeon: Jolly Needle, MD;  Location: [facility] INVASIVE CV LAB;  Service: Cardiovascular;  Laterality: N/A;   ATRIAL FIBRILLATION  ABLATION N/A 06/19/2020   Procedure: ATRIAL FIBRILLATION ABLATION;  Surgeon: Jolly Needle, MD;  Location: [facility] INVASIVE CV LAB;  Service: Cardiovascular;  Laterality: N/A;   CARDIAC ELECTROPHYSIOLOGY MAPPING AND ABLATION     history of liver biopsy  2007   History of Nephrectomy Right 1999   implantable loop recorder placement  03/19/2020    Medtronic Reveal Linq model LNQ 22 (RLB [id number] ) implantable loop recorder implanted by Dr Nunzio Belch for evaluation of palpitations and afib management post ablation   KNEE ARTHROSCOPY Left 10/10/2012   Procedure: LEFT KNEE ARTHROSCOPY WITH MENSCIAL DEBRIDEMENT AND CHONDROPLASTY;  Surgeon: Aurther Blue, MD;  Location: [facility] ORS;  Service: Orthopedics;  Laterality: Left;   left shoulder repair for bone spur  1990's   TEE WITHOUT CARDIOVERSION N/A 10/11/2018   Procedure: TRANSESOPHAGEAL ECHOCARDIOGRAM (TEE);  Surgeon: Sheryle Donning, MD;  Location[facility] ENDOSCOPY;  Service: Cardiovascular;  Laterality: N/A;  ablation to follow at 1030   TOTAL KNEE ARTHROPLASTY Right 08/15/2016   Procedure: RIGHT TOTAL KNEE ARTHROPLASTY, CORTISONE INJECTION OF LEFT KNEE;  Surgeon: Liliane Rei, MD;  Location: [facility] ORS;  Service: Orthopedics;  Laterality: Right;   TOTAL KNEE ARTHROPLASTY Left 09/04/2017   Procedure: LEFT TOTAL KNEE ARTHROPLASTY;  Surgeon: Liliane Rei, MD;  Location: [facility] ORS;  Service: Orthopedics;  Laterality: Left;   Transurethral needle ablation procedure     TUNA procedure       Family History  Problem Relation Age of Onset   Coronary artery disease Mother    CVA Mother    Alcohol abuse Father        variceal hemorrhage   Liver disease Father        alcohol related   Alcohol abuse Sister    Diabetes Sister    Breast cancer Sister    Lung cancer Sister    Esophageal cancer Sister    Cancer Brother        head and neck   Alcohol abuse Other        Strong family Hx   Colon cancer Neg Hx    Pancreatic cancer Neg Hx    Stomach cancer Neg Hx     Rectal cancer Neg Hx      Social History   Socioeconomic History   Marital status: Married    Spouse name: Not on file   Number of children: Not on file   Years of education: Not on file   Highest education level: Master's degree (e.g., MA, MS,  MEng, MEd, MSW, MBA)  Occupational History   Not on file  Tobacco Use   Smoking status: Former    Current packs/day: 0.00    Average packs/day: 1 pack/day for 15.0 years (15.0 ttl pk-yrs)    Types: Cigarettes    Start date: 08/01/1962    Quit date: 08/01/1977    Years since quitting: 46.4   Smokeless tobacco: Never  Vaping Use   Vaping status: Never Used  Substance and Sexual Activity   Alcohol use: No    Comment: past history -none in 38 yrs   Drug use: No    Comment: past hx none in 38 yrs   Sexual activity: Yes    Partners: Female  Other Topics Concern   Not on file  Social History Narrative   Christie BA, [place] Arlo [place], [place] psych. Married- '84. 2 step-daughters, 4 grandchildren. Work  Retired Buyer, retail  Marriage in good health.   Two story home   Drinks caffeine   Ambidextrous    Social Drivers of Health   Financial Resource Strain: Low Risk  (09/12/2023)   Overall Financial Resource Strain (CARDIA)    Difficulty of Paying Living Expenses: Not hard at all  Food Insecurity: No Food Insecurity (09/12/2023)   Hunger Vital Sign    Worried About Running Out of Food in the Last Year: Never true    Ran Out of Food in the Last Year: Never true  Transportation Needs: No Transportation Needs (09/12/2023)   PRAPARE - Administrator, Civil Service (Medical): No    Lack of Transportation (Non-Medical): No  Physical Activity: Sufficiently Active (09/12/2023)   Exercise Vital Sign    Days of Exercise per Week: 4 days    Minutes of Exercise per Session: 60 min  Stress: No Stress Concern Present (09/12/2023)   [employer] of Occupational Health - Occupational Stress Questionnaire    Feeling of Stress :  Not at all  Social Connections: Socially Integrated (09/12/2023)   Social Connection and Isolation Panel    Frequency of Communication with Friends and Family: Three times a week    Frequency of Social Gatherings with Friends and Family: Three times a week    Attends Religious Services: 1 to 4 times per year    Active Member of Clubs or Organizations: Yes    Attends Engineer, structural: More than 4 times per year    Marital Status: Married  Catering manager Violence: Not on file     BP 118/70   Pulse 63   Ht 6' (1.829 m)   Wt 205 lb (93 kg)   SpO2 97%   BMI 27.80 kg/m   Physical Exam:  Well appearing NAD HEENT: Unremarkable Neck:  No JVD, no thyromegally Lymphatics:  No adenopathy Back:  No CVA tenderness Lungs:  Clear HEART:  Regular rate rhythm, no murmurs, no rubs, no clicks Abd:  soft, positive bowel sounds, no organomegally, no rebound, no guarding Ext:  2 plus pulses, no edema, no cyanosis, no clubbing Skin:  No rashes no nodules Neuro:  CN II through XII intact, motor grossly intact  DEVICE  Normal device function.  See PaceArt for details. RRT  Assess/Plan: PAF - he is mostly maintaining NSR on flecainide  s/p ablation. He had RVR after ablation, off of flecainide . I have recommended he continue the flecainide . ILR -he has reached RRT. I do not recommend re-insertion of the ILR at time. I offered him ILR removal but he  prefers to leave the ILR in place Coags - he has not had any bleeding on eliquis . Continue.  Pete Brand Aldonia Keeven,MD

## 2024-01-25 ENCOUNTER — Telehealth: Payer: Self-pay | Admitting: Pharmacy Technician

## 2024-01-25 ENCOUNTER — Other Ambulatory Visit (HOSPITAL_COMMUNITY): Payer: Self-pay

## 2024-01-25 NOTE — Telephone Encounter (Signed)
 Pharmacy Patient Advocate Encounter   Received notification from CoverMyMeds that prior authorization for Repatha  SureClick 140MG /ML auto-injectors is required/requested.   Insurance verification completed.   The patient is insured through [facility] .   Per test claim: PA required; PA submitted to above mentioned insurance via CoverMyMeds Key/confirmation #/EOC [id number] Status is pending

## 2024-01-26 NOTE — Telephone Encounter (Signed)
 Pharmacy Patient Advocate Encounter  Received notification from [facility] that Prior Authorization for Repatha  SureClick 140MG /ML auto-injectors has been APPROVED from 01/25/24 to 01/24/25

## 2024-01-30 ENCOUNTER — Ambulatory Visit: Payer: Self-pay | Admitting: Physician Assistant

## 2024-01-30 ENCOUNTER — Other Ambulatory Visit (HOSPITAL_COMMUNITY): Payer: Self-pay

## 2024-01-30 DIAGNOSIS — I7121 Aneurysm of the ascending aorta, without rupture: Secondary | ICD-10-CM

## 2024-01-30 DIAGNOSIS — I272 Pulmonary hypertension, unspecified: Secondary | ICD-10-CM

## 2024-01-30 MED FILL — Hydrochlorothiazide Tab 25 MG: ORAL | 90 days supply | Qty: 90 | Fill #0 | Status: CN

## 2024-01-30 MED FILL — Flecainide Acetate Tab 100 MG: ORAL | 90 days supply | Qty: 180 | Fill #0 | Status: CN

## 2024-01-30 MED FILL — Carvedilol Tab 6.25 MG: ORAL | 90 days supply | Qty: 180 | Fill #0 | Status: AC

## 2024-01-31 ENCOUNTER — Ambulatory Visit (HOSPITAL_COMMUNITY)
Admission: RE | Admit: 2024-01-31 | Source: Ambulatory Visit | Attending: Physician Assistant | Admitting: Physician Assistant

## 2024-01-31 ENCOUNTER — Other Ambulatory Visit (HOSPITAL_COMMUNITY): Payer: Self-pay

## 2024-02-07 ENCOUNTER — Telehealth: Payer: Self-pay

## 2024-02-07 ENCOUNTER — Ambulatory Visit (HOSPITAL_COMMUNITY)
Admission: RE | Admit: 2024-02-07 | Discharge: 2024-02-07 | Disposition: A | Discharge status: Home / Self Care | Source: Ambulatory Visit | Attending: Physician Assistant | Admitting: Physician Assistant

## 2024-02-07 DIAGNOSIS — I272 Pulmonary hypertension, unspecified: Secondary | ICD-10-CM | POA: Diagnosis not present

## 2024-02-07 DIAGNOSIS — K7460 Unspecified cirrhosis of liver: Secondary | ICD-10-CM

## 2024-02-07 DIAGNOSIS — Z8619 Personal history of other infectious and parasitic diseases: Secondary | ICD-10-CM

## 2024-02-07 LAB — ECHOCARDIOGRAM COMPLETE
AR max vel: 2.67 cm2
AV Peak grad: 4.5 mmHg
Ao pk vel: 1.06 m/s
MV M vel: 5.24 m/s
MV Peak grad: 109.8 mmHg
MV VTI: 1.76 cm2
Radius: 0.5 cm
S' Lateral: 4 cm

## 2024-02-07 NOTE — Telephone Encounter (Signed)
-----   Message from [facility] Clarita H sent at 08/21/2023  2:39 PM EST ----- Regarding: RUQ due in July Patient due for RUQ U/S in July

## 2024-02-07 NOTE — Telephone Encounter (Signed)
 Order placed for RUQ U/S. MyChart to patient to expect call from schedulers to get scheduled

## 2024-02-09 ENCOUNTER — Encounter: Payer: Self-pay | Admitting: Internal Medicine

## 2024-02-09 ENCOUNTER — Ambulatory Visit: Payer: Self-pay | Admitting: Physician Assistant

## 2024-02-09 ENCOUNTER — Other Ambulatory Visit (HOSPITAL_COMMUNITY): Payer: Self-pay

## 2024-02-09 DIAGNOSIS — I340 Nonrheumatic mitral (valve) insufficiency: Secondary | ICD-10-CM

## 2024-02-09 MED FILL — Hydrochlorothiazide Tab 25 MG: ORAL | 90 days supply | Qty: 90 | Fill #0 | Status: AC

## 2024-02-15 ENCOUNTER — Ambulatory Visit (HOSPITAL_COMMUNITY)
Admission: RE | Admit: 2024-02-15 | Discharge: 2024-02-15 | Disposition: A | Discharge status: Home / Self Care | Source: Ambulatory Visit | Attending: Gastroenterology | Admitting: Gastroenterology

## 2024-02-15 DIAGNOSIS — K7460 Unspecified cirrhosis of liver: Secondary | ICD-10-CM | POA: Diagnosis not present

## 2024-02-15 DIAGNOSIS — Z8619 Personal history of other infectious and parasitic diseases: Secondary | ICD-10-CM | POA: Insufficient documentation

## 2024-02-15 DIAGNOSIS — K7689 Other specified diseases of liver: Secondary | ICD-10-CM | POA: Diagnosis not present

## 2024-02-16 ENCOUNTER — Encounter: Payer: Self-pay | Admitting: Gastroenterology

## 2024-02-16 NOTE — Telephone Encounter (Signed)
 Pt concerned about scan results. See mychart note.

## 2024-02-17 ENCOUNTER — Ambulatory Visit: Payer: Self-pay | Admitting: Gastroenterology

## 2024-02-21 ENCOUNTER — Other Ambulatory Visit (HOSPITAL_COMMUNITY): Payer: Self-pay

## 2024-02-23 DIAGNOSIS — G5621 Lesion of ulnar nerve, right upper limb: Secondary | ICD-10-CM | POA: Diagnosis not present

## 2024-02-26 DIAGNOSIS — G5621 Lesion of ulnar nerve, right upper limb: Secondary | ICD-10-CM | POA: Diagnosis not present

## 2024-02-28 ENCOUNTER — Encounter: Payer: Self-pay | Admitting: Nurse Practitioner

## 2024-02-29 DIAGNOSIS — G5621 Lesion of ulnar nerve, right upper limb: Secondary | ICD-10-CM | POA: Diagnosis not present

## 2024-03-01 ENCOUNTER — Telehealth: Payer: Self-pay

## 2024-03-01 NOTE — Telephone Encounter (Signed)
...     Pre-operative Risk Assessment    Patient Name: Timothy Dickson  DOB: February 19, 1946 MRN: [id number]   Date of last office visit: 6/20/ Date of next office visit: NONE   Request for Surgical Clearance    Procedure:  RIGHT ELBOW UINAR NERVE RELEASE AND OR TRANSPOSITION WITH BLOCK AND IV SEDATION   Date of Surgery:  Clearance TBD                                Surgeon:  PRENTICE PAGAN Surgeon's Group or Practice Name:  [facility] Phone number:  [phone] Fax number:  [phone]   Type of Clearance Requested:   - Medical  - Pharmacy:  Hold Apixaban  (Eliquis )     Type of Anesthesia:  BLOCK AND IV SEDATION   Additional requests/questions:    Bonney Teressa Rumalda Ronal   03/01/2024, 10:24 AM

## 2024-03-06 NOTE — Telephone Encounter (Signed)
 Patient already has a scheduled appt with Dr. Waddell on 8/14 and Dr. Santo on 9/3. I have updated appointment notes for clearance and will seen requesting provider updated cardiac clearance recommendations.

## 2024-03-06 NOTE — Telephone Encounter (Signed)
   Name: Timothy Dickson  DOB: [birth year]  MRN: [id number]  Primary Cardiologist: Stanly DELENA Leavens, MD   Preoperative team, please contact this patient and set up a phone call appointment for further preoperative risk assessment. Please obtain consent and complete medication review. Surgery scheduled for 03/20/2024. Thank you for your help.  I confirm that guidance regarding antiplatelet and oral anticoagulation therapy has been completed and, if necessary, noted below. Pharmacy has weighed in and sent info.  I also confirmed the patient resides in the state of [place] . As per [facility] Medical Board telemedicine laws, the patient must reside in the state in which the provider is licensed.   [name], NP 03/06/2024, 11:39 AM [facility]

## 2024-03-06 NOTE — Telephone Encounter (Signed)
 Received the following message from the pt from MyChart:   I'm having ulnar nerve surgery on my right arm on August 20th. The surgery team has received instructions on the Eliquis  but they still need to receive an ok to do the surgery from you. Please send approval to Duwaine Moats, [phone], FAX: [phone] Thanks   Pt stats procedure date is 03/20/24  Will forward to the Preop Pool for the Preop APP to review.

## 2024-03-11 ENCOUNTER — Encounter: Payer: Self-pay | Admitting: Internal Medicine

## 2024-03-11 ENCOUNTER — Other Ambulatory Visit: Payer: Self-pay

## 2024-03-11 ENCOUNTER — Other Ambulatory Visit (HOSPITAL_COMMUNITY): Payer: Self-pay

## 2024-03-11 DIAGNOSIS — I480 Paroxysmal atrial fibrillation: Secondary | ICD-10-CM

## 2024-03-11 MED ORDER — APIXABAN 5 MG PO TABS
5.0000 mg | ORAL_TABLET | Freq: Two times a day (BID) | ORAL | 11 refills | Status: DC
  Filled 2024-03-11: qty 60, 30d supply, fill #0
  Filled 2024-03-12: qty 180, 90d supply, fill #0

## 2024-03-11 MED FILL — Flecainide Acetate Tab 100 MG: ORAL | 90 days supply | Qty: 180 | Fill #0 | Status: AC

## 2024-03-12 ENCOUNTER — Other Ambulatory Visit: Payer: Self-pay

## 2024-03-12 ENCOUNTER — Telehealth: Payer: Self-pay | Admitting: Internal Medicine

## 2024-03-12 ENCOUNTER — Other Ambulatory Visit (HOSPITAL_COMMUNITY): Payer: Self-pay

## 2024-03-12 DIAGNOSIS — I480 Paroxysmal atrial fibrillation: Secondary | ICD-10-CM

## 2024-03-12 MED ORDER — APIXABAN 5 MG PO TABS
5.0000 mg | ORAL_TABLET | Freq: Two times a day (BID) | ORAL | 1 refills | Status: DC
  Filled 2024-03-12: qty 180, 90d supply, fill #0
  Filled 2024-06-06: qty 180, 90d supply, fill #1

## 2024-03-12 NOTE — Telephone Encounter (Signed)
*  STAT* If patient is at the pharmacy, call can be transferred to refill team.   1. Which medications need to be refilled? (please list name of each medication and dose if known)   apixaban  (ELIQUIS ) 5 MG TABS tablet     4. Which pharmacy/location (including street and city if local pharmacy) is medication to be sent to?  [facility]     5. Do they need a 30 day or 90 day supply? 90    Pharmacy called in stating pt is requesting 90 day supply instead of 30

## 2024-03-13 ENCOUNTER — Other Ambulatory Visit (HOSPITAL_COMMUNITY): Payer: Self-pay

## 2024-03-13 DIAGNOSIS — K219 Gastro-esophageal reflux disease without esophagitis: Secondary | ICD-10-CM | POA: Insufficient documentation

## 2024-03-13 DIAGNOSIS — G5700 Lesion of sciatic nerve, unspecified lower limb: Secondary | ICD-10-CM | POA: Insufficient documentation

## 2024-03-13 DIAGNOSIS — R430 Anosmia: Secondary | ICD-10-CM | POA: Insufficient documentation

## 2024-03-13 DIAGNOSIS — E1140 Type 2 diabetes mellitus with diabetic neuropathy, unspecified: Secondary | ICD-10-CM | POA: Insufficient documentation

## 2024-03-13 DIAGNOSIS — F1021 Alcohol dependence, in remission: Secondary | ICD-10-CM | POA: Insufficient documentation

## 2024-03-13 DIAGNOSIS — I77811 Abdominal aortic ectasia: Secondary | ICD-10-CM | POA: Insufficient documentation

## 2024-03-13 DIAGNOSIS — E7800 Pure hypercholesterolemia, unspecified: Secondary | ICD-10-CM | POA: Insufficient documentation

## 2024-03-13 NOTE — Telephone Encounter (Signed)
 Addressed in another message.

## 2024-03-13 NOTE — Telephone Encounter (Signed)
   Patient Name: Timothy Dickson  DOB: 04/16/1946 MRN: [id number]  Primary Cardiologist: Stanly DELENA Leavens, MD  Clinical pharmacists have reviewed the patient's past medical history, labs, and current medications as part of preoperative protocol coverage. The following recommendations have been made:  Patient with diagnosis of A Fib on Eliquis  for anticoagulation.     Procedure:  RIGHT ELBOW UINAR NERVE RELEASE AND OR TRANSPOSITION WITH BLOCK AND IV SEDATION  Date of procedure: 03/20/24     CHA2DS2-VASc Score = 5  {This indicates a 7.2% annual risk of stroke. The patient's score is based upon: CHF History: 0 HTN History: 1 Diabetes History: 1 Stroke History: 0 Vascular Disease History: 1 Age Score: 2 Gender Score: 0       CrCl 73 ml/min Platelet count 207K   Patient has not had an Afib/aflutter ablation within the last 3 months or DCCV within the last 30 days     Per office protocol, patient can hold Eliquis  for 2 days prior to procedure.     I will route this recommendation to the requesting party via Epic fax function and remove from pre-op pool.  Please call with questions.  [name], NP 03/13/2024, 2:45 PM

## 2024-03-13 NOTE — Telephone Encounter (Signed)
 From the chart, I see the patient has an appointment tomorrow with Dr. Santo. Is there anything else I need to do. Please advise.

## 2024-03-13 NOTE — Telephone Encounter (Signed)
 Patient with diagnosis of A Fib on Eliquis  for anticoagulation.    Procedure:  RIGHT ELBOW UINAR NERVE RELEASE AND OR TRANSPOSITION WITH BLOCK AND IV SEDATION  Date of procedure: 03/20/24   CHA2DS2-VASc Score = 5  {This indicates a 7.2% annual risk of stroke. The patient's score is based upon: CHF History: 0 HTN History: 1 Diabetes History: 1 Stroke History: 0 Vascular Disease History: 1 Age Score: 2 Gender Score: 0      CrCl 73 ml/min Platelet count 207K  Patient has not had an Afib/aflutter ablation within the last 3 months or DCCV within the last 30 days   Per office protocol, patient can hold Eliquis  for 2 days prior to procedure.    Patient will not need bridging with Lovenox (enoxaparin) around procedure.  **This guidance is not considered finalized until pre-operative APP has relayed final recommendations.**

## 2024-03-14 ENCOUNTER — Ambulatory Visit: Attending: Internal Medicine | Admitting: Internal Medicine

## 2024-03-14 ENCOUNTER — Encounter: Payer: Self-pay | Admitting: Internal Medicine

## 2024-03-14 VITALS — BP 125/71 | HR 58 | Ht 72.0 in | Wt 205.0 lb

## 2024-03-14 DIAGNOSIS — I480 Paroxysmal atrial fibrillation: Secondary | ICD-10-CM | POA: Diagnosis not present

## 2024-03-14 DIAGNOSIS — Z01810 Encounter for preprocedural cardiovascular examination: Secondary | ICD-10-CM | POA: Diagnosis not present

## 2024-03-14 NOTE — Patient Instructions (Addendum)
 Medication Instructions:  Your physician recommends that you continue on your current medications as directed. Please refer to the Current Medication list given to you today.  *If you need a refill on your cardiac medications before your next appointment, please call your pharmacy*  Lab Work: None ordered.  You may go to any [facility] Location for your lab work:  [facility] - [address] ([facility]) - [address] [phone] [address]  [place] - [address]   [place]  - [address]   [place] - [address] - [address] [employer]  - [address] [place] - [address] ([facility]   If you have labs (blood work) drawn today and your tests are completely normal, you will receive your results only by: [employer] (if you have MyChart)  If you have any lab test that is abnormal or we need to change your treatment, we will call you or send a MyChart message to review the results.  Testing/Procedures: Medication Instructions:  Your physician recommends that you continue on your current medications as directed. Please refer to the Current Medication list given to you today.  Labwork: None ordered.  Testing/Procedures: None ordered.  Follow-Up:   Implantable Loop Recorder Removal, Care After This sheet gives you information about how to care for yourself after your procedure. Your health care provider may also give you more specific instructions. If you have problems or questions, contact your health care provider. What can I expect after the procedure? After the procedure, it is common to have: Soreness or discomfort near the incision. Some swelling or bruising near the incision.  Follow these instructions at home: Incision care  Monitor your cardiac device site for redness, swelling, and drainage. Call the device clinic at [phone] if you  experience these symptoms or fever/chills.  Keep the large square bandage on your site for 24 hours and then you may remove it yourself. Keep the steri-strips underneath in place.   You may shower after 72 hours / 3 days from your procedure with the steri-strips in place. They will usually fall off on their own, or may be removed after 10 days. Pat dry.   Avoid lotions, ointments, or perfumes over your incision until it is well-healed.  Please do not submerge in water  until your site is completely healed.   If your wound site starts to bleed apply pressure.       If you have any questions/concerns please call the device clinic at [phone].  Activity  Return to your normal activities.  Contact a health care provider if: You have redness, swelling, or pain around your incision. You have a fever.   Follow-Up: At [facility], you and your health needs are our priority.  As part of our continuing mission to provide you with exceptional heart care, we have created designated Provider Care Teams.  These Care Teams include your primary Cardiologist (physician) and Advanced Practice Providers (APPs -  Physician Assistants and Nurse Practitioners) who all work together to provide you with the care you need, when you need it.  Your next appointment:   1 year(s)  The format for your next appointment:   In Person  Provider:   Donnice Primus, MD or one of the following Advanced Practice Providers on your designated Care Team:   Charlies Arthur, PA-C Michael Andy Tillery, PA-C  Leotis Barrack, NP

## 2024-03-14 NOTE — Progress Notes (Signed)
 HPI Timothy Dickson returns today for followup. He is a pleasant 78 yo man with persistent atrial fib. He has a h/o HTN, DM, s/p ablation. He had recurrent atrial fib requiring that he go back on flecainide  but since then he has been stable. He has been on eliquis  without difficulty/bleeding.Review of his ILR demonstrates 99.9% NSR. He has not had syncope and denies chest pain or sob. He does feel his atrial fib. He presents today to have his loop removed. He has ulnar nerve surgery scheduled for next week. Allergies  Allergen Reactions   Tramadol    Ibuprofen      Other Reaction(s): Not available  ibuprofen    Tramadol Hcl     Other reaction(s): ineffective   Acetaminophen  Other (See Comments)    Pt states he was told by [facility]'s to keep tylenol  dosage to 2000 mg per 24 hours due to cirrhosis of liver.  Tylenol    Nsaids Other (See Comments)    Avoid due to having 1 kidney    Pravastatin  Other (See Comments)    Pt reports this med makes him generally feel unwell.     Current Outpatient Medications  Medication Sig Dispense Refill   acetaminophen  (TYLENOL ) 650 MG CR tablet Take 650 mg by mouth See admin instructions. Take 650 mg at 11 am and take 650 mg at 4-5 am (take with gabapentin )     allopurinol  (ZYLOPRIM ) 100 MG tablet Take 1 tablet (100 mg total) by mouth daily. 90 tablet 1   Aloe-Sodium Chloride  (AYR SALINE NASAL GEL NA) Place 1 application into the nose at bedtime.     apixaban  (ELIQUIS ) 5 MG TABS tablet Take 1 tablet (5 mg total) by mouth 2 (two) times daily. 180 tablet 1   carvedilol  (COREG ) 6.25 MG tablet Take 1 tablet (6.25 mg total) by mouth 2 (two) times daily. 180 tablet 3   Evolocumab  (REPATHA  SURECLICK) 140 MG/ML SOAJ Inject 140 mg (contents of one pen) into the skin every 14 (fourteen) days. 6 mL 3   famotidine  (PEPCID ) 20 MG tablet Take 20 mg by mouth daily as needed for heartburn.      flecainide  (TAMBOCOR ) 100 MG tablet TAKE ONE TABLET BY MOUTH TWICE DAILY 180  tablet 3   gabapentin  (NEURONTIN ) 800 MG tablet Take 1 tablet (800 mg total) by mouth 2 (two) times daily. 180 tablet 1   hydrochlorothiazide  (HYDRODIURIL ) 25 MG tablet TAKE ONE TABLET BY MOUTH DAILY 90 tablet 0   omeprazole  (PRILOSEC ) 20 MG capsule Take 20 mg by mouth every other day.      sodium chloride  (OCEAN) 0.65 % SOLN nasal spray Place 1 spray into both nostrils daily.     No current facility-administered medications for this visit.     Past Medical History:  Diagnosis Date   Ascending aortic aneurysm (HCC) 12/21/2015   MR angio chest: 06/2019. Diameter of 4.2cm. annual imaging.   Chest/Abd/Pelvic CTA 01/22/23: 4 mm RUL nodule, 4.1 cm ascending TAA, aortic atherosclerosis    Atrial fibrillation (HCC) 2019   Atrial tachycardia (HCC)    documented by holter 2016. Followed by Dr. K.Nelson and Dr. Kelsie.   Cancer of kidney (HCC)    right kidney-removed. no further tx other than surgery [facility].   Cirrhosis (HCC)    [facility] dx, early stage- follow up visits at [facility].   CRI (chronic renal insufficiency)    s/p R nephrectomy in 1999 for malignancy   Diabetes mellitus without complication (HCC)  NIDDM- dx. 3-4 years ago diet control   Diverticulosis of colon    mild   GERD (gastroesophageal reflux disease)    Headache(784.0)    Hepatitis C    s/p therapy [facility] (curative)/with Harvoni   History of BPH    no recent issues   History of substance abuse (HCC)    past history -none in 38 yrs   Hyperglycemia    Hyperlipemia    Hypertension    Implantable loop recorder present    Osteoarthritis, knee    knees bilaterally    Pneumonia 1975   hx of 35 years ago    ROS:   All systems reviewed and negative except as noted in the HPI.   Past Surgical History:  Procedure Laterality Date   Arthroscopy right knee     may '11 (Dr Ernie)   Arthroscopy, left knee,     ATRIAL FIBRILLATION ABLATION N/A 10/11/2018   Procedure: ATRIAL FIBRILLATION ABLATION;  Surgeon: Kelsie Agent, MD;   Location: [facility];  Service: Cardiovascular;  Laterality: N/A;   ATRIAL FIBRILLATION ABLATION N/A 06/19/2020   Procedure: ATRIAL FIBRILLATION ABLATION;  Surgeon: Kelsie Agent, MD;  Location: [facility];  Service: Cardiovascular;  Laterality: N/A;   CARDIAC ELECTROPHYSIOLOGY MAPPING AND ABLATION     history of liver biopsy  2007   History of Nephrectomy Right 1999   implantable loop recorder placement  03/19/2020    Medtronic Reveal Linq model LNQ 22 (RLB [id number] ) implantable loop recorder implanted by Dr Kelsie for evaluation of palpitations and afib management post ablation   KNEE ARTHROSCOPY Left 10/10/2012   Procedure: LEFT KNEE ARTHROSCOPY WITH MENSCIAL DEBRIDEMENT AND CHONDROPLASTY;  Surgeon: Dempsey LULLA Moan, MD;  Location: [facility];  Service: Orthopedics;  Laterality: Left;   left shoulder repair for bone spur  1990's   TEE WITHOUT CARDIOVERSION N/A 10/11/2018   Procedure: TRANSESOPHAGEAL ECHOCARDIOGRAM (TEE);  Surgeon: Lonni Slain, MD;  [facility];  Service: Cardiovascular;  Laterality: N/A;  ablation to follow at 1030   TOTAL KNEE ARTHROPLASTY Right 08/15/2016   Procedure: RIGHT TOTAL KNEE ARTHROPLASTY, CORTISONE INJECTION OF LEFT KNEE;  Surgeon: Dempsey Moan, MD;  Location: [facility];  Service: Orthopedics;  Laterality: Right;   TOTAL KNEE ARTHROPLASTY Left 09/04/2017   Procedure: LEFT TOTAL KNEE ARTHROPLASTY;  Surgeon: Moan Dempsey, MD;  Location: [facility];  Service: Orthopedics;  Laterality: Left;   Transurethral needle ablation procedure     TUNA procedure       Family History  Problem Relation Age of Onset   Coronary artery disease Mother    CVA Mother    Alcohol abuse Father        variceal hemorrhage   Liver disease Father        alcohol related   Alcohol abuse Sister    Diabetes Sister    Breast cancer Sister    Lung cancer Sister    Esophageal cancer Sister    Cancer Brother        head and neck   Alcohol abuse Other         Strong family Hx   Colon cancer Neg Hx    Pancreatic cancer Neg Hx    Stomach cancer Neg Hx    Rectal cancer Neg Hx      Social History   Socioeconomic History   Marital status: Married    Spouse name: Not on file   Number of children: Not on file   Years  of education: Not on file   Highest education level: Master's degree (e.g., MA, MS, MEng, MEd, MSW, MBA)  Occupational History   Not on file  Tobacco Use   Smoking status: Former    Current packs/day: 0.00    Average packs/day: 1 pack/day for 15.0 years (15.0 ttl pk-yrs)    Types: Cigarettes    Start date: 08/01/1962    Quit date: 08/01/1977    Years since quitting: 46.6   Smokeless tobacco: Never  Vaping Use   Vaping status: Never Used  Substance and Sexual Activity   Alcohol use: No    Comment: past history -none in 38 yrs   Drug use: No    Comment: past hx none in 38 yrs   Sexual activity: Yes    Partners: Female  Other Topics Concern   Not on file  Social History Narrative   [place] BA, [place] Frances Mouton, [place] psych. Married- '84. 2 step-daughters, 4 grandchildren. Work  Retired Buyer, retail  Marriage in good health.   Two story home   Drinks caffeine   Ambidextrous    Social Drivers of Health   Financial Resource Strain: Low Risk  (09/12/2023)   Overall Financial Resource Strain (CARDIA)    Difficulty of Paying Living Expenses: Not hard at all  Food Insecurity: No Food Insecurity (09/12/2023)   Hunger Vital Sign    Worried About Running Out of Food in the Last Year: Never true    Ran Out of Food in the Last Year: Never true  Transportation Needs: No Transportation Needs (09/12/2023)   PRAPARE - Administrator, Civil Service (Medical): No    Lack of Transportation (Non-Medical): No  Physical Activity: Sufficiently Active (09/12/2023)   Exercise Vital Sign    Days of Exercise per Week: 4 days    Minutes of Exercise per Session: 60 min  Stress: No Stress Concern Present (09/12/2023)    [employer] of Occupational Health - Occupational Stress Questionnaire    Feeling of Stress : Not at all  Social Connections: Socially Integrated (09/12/2023)   Social Connection and Isolation Panel    Frequency of Communication with Friends and Family: Three times a week    Frequency of Social Gatherings with Friends and Family: Three times a week    Attends Religious Services: 1 to 4 times per year    Active Member of Clubs or Organizations: Yes    Attends Engineer, structural: More than 4 times per year    Marital Status: Married  Catering manager Violence: Not on file     BP 125/71   Pulse (!) 58   Ht 6' (1.829 m)   Wt 205 lb (93 kg)   SpO2 98%   BMI 27.80 kg/m   Physical Exam:  Well appearing NAD HEENT: Unremarkable Neck:  No JVD, no thyromegally Lymphatics:  No adenopathy Back:  No CVA tenderness Lungs:  Clear with no wheezes HEART:  Regular rate rhythm, no murmurs, no rubs, no clicks Abd:  soft, positive bowel sounds, no organomegally, no rebound, no guarding Ext:  2 plus pulses, no edema, no cyanosis, no clubbing Skin:  No rashes no nodules Neuro:  CN II through XII intact, motor grossly intact  EKG - NSR with IRBBB   Assess/Plan:  PAF - he is mostly maintaining NSR on flecainide  s/p ablation. He had RVR after ablation, off of flecainide . I have recommended he continue the flecainide . ILR -he has reached RRT. I do  not recommend re-insertion of the ILR at time. He would like to have his device removed. I discussed the indication/risk/benefits/goals/expectations and he wishes to have the ILR removed. Coags - he has not had any bleeding on eliquis . Continue. Preop - he is pending ulnar nerve surgery. He is an acceptable surgical risk. He may hold his eliquis  2-3 days prior to surgery and restart when the bleeding risk is acceptable.   Danelle Waddell COME  EP Procedure note  Procedure performed: ILR removal.  Preop diagnosis: PAF Post op diagnosis:  PAF with the ILR at end of service.  Description of the procedure: after informed consent and the appropriate time out obtained, the patient was prepped and draped in a sterile fashion. 6 cc of lidocaine  was infiltrated over the old incision site. A 1 cm stab incision was carried out. A combination of blunt and sharp dissection was utilized and the device was grasped and removed with gentle traction. Pressure was applied and hemostasis assured and steristrips and a bandage was placed and the patient recovered in the usual manner.   Complications: none immediately.  Conclusion: successful ILR removal without complication.   Danelle Solace Manwarren,MD

## 2024-03-20 ENCOUNTER — Other Ambulatory Visit (HOSPITAL_COMMUNITY): Payer: Self-pay

## 2024-03-20 DIAGNOSIS — G8918 Other acute postprocedural pain: Secondary | ICD-10-CM | POA: Diagnosis not present

## 2024-03-20 DIAGNOSIS — G5621 Lesion of ulnar nerve, right upper limb: Secondary | ICD-10-CM | POA: Diagnosis not present

## 2024-03-20 HISTORY — PX: ELBOW SURGERY: SHX618

## 2024-03-20 MED ORDER — OXYCODONE HCL 5 MG PO TABS
5.0000 mg | ORAL_TABLET | ORAL | 0 refills | Status: DC
  Filled 2024-03-20: qty 30, 5d supply, fill #0

## 2024-03-26 ENCOUNTER — Ambulatory Visit (INDEPENDENT_AMBULATORY_CARE_PROVIDER_SITE_OTHER)

## 2024-03-26 VITALS — Ht 72.0 in | Wt 205.0 lb

## 2024-03-26 DIAGNOSIS — E119 Type 2 diabetes mellitus without complications: Secondary | ICD-10-CM

## 2024-03-26 DIAGNOSIS — Z0000 Encounter for general adult medical examination without abnormal findings: Secondary | ICD-10-CM | POA: Diagnosis not present

## 2024-03-26 NOTE — Progress Notes (Signed)
 Subjective:   Timothy Dickson is a 78 y.o. who presents for a Medicare Wellness preventive visit.  As a reminder, Annual Wellness Visits don't include a physical exam, and some assessments may be limited, especially if this visit is performed virtually. We may recommend an in-person follow-up visit with your provider if needed.  Visit Complete: Virtual I connected with  Timothy Dickson on 03/26/24 by a audio enabled telemedicine application and verified that I am speaking with the correct person using two identifiers.  Patient Location: Home  Provider Location: Home Office  I discussed the limitations of evaluation and management by telemedicine. The patient expressed understanding and agreed to proceed.  Vital Signs: Because this visit was a virtual/telehealth visit, some criteria may be missing or patient reported. Any vitals not documented were not able to be obtained and vitals that have been documented are patient reported.  VideoDeclined- This patient declined Librarian, academic. Therefore the visit was completed with audio only.  Persons Participating in Visit: Patient.  AWV Questionnaire: Yes: Patient Medicare AWV questionnaire was completed by the patient on 03/22/2024; I have confirmed that all information answered by patient is correct and no changes since this date.  Cardiac Risk Factors include: advanced age (>73men, >35 women);hypertension;male gender;diabetes mellitus;dyslipidemia;Other (see comment), Risk factor comments: PAF,     Objective:    Today's Vitals   03/26/24 1040  Weight: 205 lb (93 kg)  Height: 6' (1.829 m)   Body mass index is 27.8 kg/m.     03/26/2024   10:53 AM 06/19/2020    6:09 AM 04/20/2020    1:46 PM 05/27/2019   10:30 AM 10/11/2018    8:37 AM 03/18/2018    8:35 PM 09/04/2017   12:27 PM  Advanced Directives  Does Patient Have a Medical Advance Directive? Yes Yes Yes Yes Yes  Yes  Yes   Type of Special educational needs teacher of [place];Living will Healthcare Power of [place];Living will  Healthcare Power of [place];Living will Healthcare Power of [place];Living will Living will;Healthcare Power of [employer] Power of [place];Living will  Does patient want to make changes to medical advance directive?  No - Patient declined    No - Patient declined  No - Patient declined   Copy of Healthcare Power of Attorney in Chart? No - copy requested No - copy requested  No - copy requested No - copy requested  No - copy requested  No - copy requested      Data saved with a previous flowsheet row definition    Current Medications (verified) Outpatient Encounter Medications as of 03/26/2024  Medication Sig   acetaminophen  (TYLENOL ) 650 MG CR tablet Take 650 mg by mouth See admin instructions. Take 650 mg at 11 am and take 650 mg at 4-5 am (take with gabapentin )   allopurinol  (ZYLOPRIM ) 100 MG tablet Take 1 tablet (100 mg total) by mouth daily.   Aloe-Sodium Chloride  (AYR SALINE NASAL GEL NA) Place 1 application into the nose at bedtime.   apixaban  (ELIQUIS ) 5 MG TABS tablet Take 1 tablet (5 mg total) by mouth 2 (two) times daily.   carvedilol  (COREG ) 6.25 MG tablet Take 1 tablet (6.25 mg total) by mouth 2 (two) times daily.   Evolocumab  (REPATHA  SURECLICK) 140 MG/ML SOAJ Inject 140 mg (contents of one pen) into the skin every 14 (fourteen) days.   famotidine  (PEPCID ) 20 MG tablet Take 20 mg by mouth daily as needed for heartburn.  flecainide  (TAMBOCOR ) 100 MG tablet TAKE ONE TABLET BY MOUTH TWICE DAILY   gabapentin  (NEURONTIN ) 800 MG tablet Take 1 tablet (800 mg total) by mouth 2 (two) times daily.   hydrochlorothiazide  (HYDRODIURIL ) 25 MG tablet TAKE ONE TABLET BY MOUTH DAILY   omeprazole  (PRILOSEC ) 20 MG capsule Take 20 mg by mouth every other day.    sodium chloride  (OCEAN) 0.65 % SOLN nasal spray Place 1 spray into both nostrils daily.   oxyCODONE  (OXY IR/ROXICODONE ) 5 MG immediate release  tablet Take 1 tablet (5 mg total) by mouth every 4 (four) to 6 (six) hours as directed for 5 days.   No facility-administered encounter medications on file as of 03/26/2024.    Allergies (verified) Tramadol, Ibuprofen , Tramadol hcl, Acetaminophen , Nsaids, and Pravastatin    History: Past Medical History:  Diagnosis Date   Ascending aortic aneurysm (HCC) 12/21/2015   MR angio chest: 06/2019. Diameter of 4.2cm. annual imaging.   Chest/Abd/Pelvic CTA 01/22/23: 4 mm RUL nodule, 4.1 cm ascending TAA, aortic atherosclerosis    Atrial fibrillation (HCC) 2019   Atrial tachycardia (HCC)    documented by holter 2016. Followed by Dr. K.Nelson and Dr. Kelsie.   Cancer of kidney (HCC)    right kidney-removed. no further tx other than surgery [facility].   Cirrhosis (HCC)    [facility] dx, early stage- follow up visits at [facility].   CRI (chronic renal insufficiency)    s/p R nephrectomy in 1999 for malignancy   Diabetes mellitus without complication (HCC)    NIDDM- dx. 3-4 years ago diet control   Diverticulosis of colon    mild   GERD (gastroesophageal reflux disease)    Headache(784.0)    Hepatitis C    s/p therapy [facility] (curative)/with Harvoni   History of BPH    no recent issues   History of substance abuse (HCC)    past history -none in 38 yrs   Hyperglycemia    Hyperlipemia    Hypertension    Implantable loop recorder present    Osteoarthritis, knee    knees bilaterally    Pneumonia 1975   hx of 35 years ago   Past Surgical History:  Procedure Laterality Date   Arthroscopy right knee     may '11 (Dr Ernie)   Arthroscopy, left knee,     ATRIAL FIBRILLATION ABLATION N/A 10/11/2018   Procedure: ATRIAL FIBRILLATION ABLATION;  Surgeon: Kelsie Agent, MD;  Location: [facility] INVASIVE CV LAB;  Service: Cardiovascular;  Laterality: N/A;   ATRIAL FIBRILLATION ABLATION N/A 06/19/2020   Procedure: ATRIAL FIBRILLATION ABLATION;  Surgeon: Kelsie Agent, MD;  Location: [facility] INVASIVE CV LAB;  Service:  Cardiovascular;  Laterality: N/A;   CARDIAC ELECTROPHYSIOLOGY MAPPING AND ABLATION     ELBOW SURGERY Right 03/20/2024   history of liver biopsy  2007   History of Nephrectomy Right 1999   implantable loop recorder placement  03/19/2020    Medtronic Reveal Linq model LNQ 22 (RLB [id number] ) implantable loop recorder implanted by Dr Kelsie for evaluation of palpitations and afib management post ablation   KNEE ARTHROSCOPY Left 10/10/2012   Procedure: LEFT KNEE ARTHROSCOPY WITH MENSCIAL DEBRIDEMENT AND CHONDROPLASTY;  Surgeon: Dempsey LULLA Moan, MD;  Location: [facility] ORS;  Service: Orthopedics;  Laterality: Left;   left shoulder repair for bone spur  1990's   TEE WITHOUT CARDIOVERSION N/A 10/11/2018   Procedure: TRANSESOPHAGEAL ECHOCARDIOGRAM (TEE);  Surgeon: Lonni Slain, MD;  [facility] ENDOSCOPY;  Service: Cardiovascular;  Laterality: N/A;  ablation to follow at 1030  TOTAL KNEE ARTHROPLASTY Right 08/15/2016   Procedure: RIGHT TOTAL KNEE ARTHROPLASTY, CORTISONE INJECTION OF LEFT KNEE;  Surgeon: Dempsey Moan, MD;  Location: [facility] OR[facility];  Service: Orthopedics;  Laterality: Right;   TOTAL KNEE ARTHROPLASTY Left 09/04/2017   Procedure: LEFT TOTAL KNEE ARTHROPLASTY;  Surgeon: Moan Dempsey, MD;  Location: [facility];  Service: Orthopedics;  Laterality: Left;   Transurethral needle ablation procedure     TUNA procedure     Family History  Problem Relation Age of Onset   Coronary artery disease Mother    CVA Mother    Alcohol abuse Father        variceal hemorrhage   Liver disease Father        alcohol related   Alcohol abuse Sister    Diabetes Sister    Breast cancer Sister    Lung cancer Sister    Esophageal cancer Sister    Cancer Brother        head and neck   Alcohol abuse Other        Strong family Hx   Colon cancer Neg Hx    Pancreatic cancer Neg Hx    Stomach cancer Neg Hx    Rectal cancer Neg Hx    Social History   Socioeconomic History   Marital status: Married    Spouse  name: Horticulturist, commercial   Number of children: 2   Years of education: Not on file   Highest education level: Master's degree (e.g., MA, MS, MEng, MEd, MSW, MBA)  Occupational History   Occupation: RETIRED  Tobacco Use   Smoking status: Former    Current packs/day: 0.00    Average packs/day: 1 pack/day for 15.0 years (15.0 ttl pk-yrs)    Types: Cigarettes    Start date: 08/01/1962    Quit date: 08/01/1977    Years since quitting: 46.6   Smokeless tobacco: Never  Vaping Use   Vaping status: Never Used  Substance and Sexual Activity   Alcohol use: No    Comment: past history -none in 38 yrs   Drug use: No    Comment: past hx none in 38 yrs   Sexual activity: Yes    Partners: Female  Other Topics Concern   Not on file  Social History Narrative   [place] BA, [place] [employer], [place] psych. Married- '84. 2 step-daughters, 4 grandchildren. Work  Retired Buyer, retail  Marriage in good health.   Two story home   Drinks caffeine   Ambidextrous       Lives with his spouse/2025 and 1 dog   Social Drivers of Corporate investment banker Strain: Low Risk  (03/22/2024)   Overall Financial Resource Strain (CARDIA)    Difficulty of Paying Living Expenses: Not hard at all  Food Insecurity: No Food Insecurity (03/22/2024)   Hunger Vital Sign    Worried About Running Out of Food in the Last Year: Never true    Ran Out of Food in the Last Year: Never true  Transportation Needs: No Transportation Needs (03/22/2024)   PRAPARE - Administrator, Civil Service (Medical): No    Lack of Transportation (Non-Medical): No  Physical Activity: Sufficiently Active (03/26/2024)   Exercise Vital Sign    Days of Exercise per Week: 5 days    Minutes of Exercise per Session: 120 min  Stress: No Stress Concern Present (03/22/2024)   [employer] of Occupational Health - Occupational Stress Questionnaire    Feeling of Stress: Not at  all  Social Connections: Moderately Integrated (03/22/2024)    Social Connection and Isolation Panel    Frequency of Communication with Friends and Family: Three times a week    Frequency of Social Gatherings with Friends and Family: Once a week    Attends Religious Services: Never    Database administrator or Organizations: Yes    Attends Engineer, structural: 1 to 4 times per year    Marital Status: Married    Tobacco Counseling Counseling given: Not Answered    Clinical Intake:  Pre-visit preparation completed: Yes  Pain : No/denies pain (elbow surgery)     BMI - recorded: 27.8 Nutritional Status: BMI 25 -29 Overweight Nutritional Risks: None Diabetes: Yes CBG done?: No Did pt. bring in CBG monitor from home?: No  Lab Results  Component Value Date   HGBA1C 7.3 (H) 09/13/2023   HGBA1C 6.9 (H) 03/30/2023   HGBA1C 6.5 (A) 09/29/2022     How often do you need to have someone help you when you read instructions, pamphlets, or other written materials from your doctor or pharmacy?: 1 - Never  Interpreter Needed?: No  Information entered by :: Ladanian Kelter, RMA   Activities of Daily Living     03/22/2024   12:30 PM  In your present state of health, do you have any difficulty performing the following activities:  Hearing? 0  Vision? 0  Difficulty concentrating or making decisions? 0  Walking or climbing stairs? 1  Dressing or bathing? 0  Doing errands, shopping? 0  Preparing Food and eating ? N  Using the Toilet? N  In the past six months, have you accidently leaked urine? N  Do you have problems with loss of bowel control? N  Managing your Medications? N  Managing your Finances? N  Housekeeping or managing your Housekeeping? N    Patient Care Team: Elnor Lauraine BRAVO, NP as PCP - General (Nurse Practitioner) Kelsie Agent, MD (Inactive) as PCP - Electrophysiology (Cardiology) Santo Stanly LABOR, MD as PCP - Cardiology (Cardiology) Carollee Norris, MD (Internal Medicine) Joshua Sieving, MD as Consulting  Physician (Dermatology) Waylan Cain, MD as Consulting Physician (Ophthalmology) Colon Shove, MD as Consulting Physician (Neurosurgery) Waddell Danelle ORN, MD as Consulting Physician (Cardiology)  I have updated your Care Teams any recent Medical Services you may have received from other providers in the past year.     Assessment:   This is a routine wellness examination for Timothy Dickson.  Hearing/Vision screen Hearing Screening - Comments:: Denies hearing difficulties   Vision Screening - Comments:: Wears eyeglasses/ [facility]/Dr. Bowen   Goals Addressed               This Visit's Progress     Patient Stated (pt-stated)        Not really/2025       Depression Screen     03/26/2024   10:54 AM 09/13/2023    1:26 PM 03/30/2023    2:06 PM 01/06/2023    2:34 PM 12/06/2022    1:49 PM 09/29/2022    1:28 PM 05/30/2022   11:32 AM  PHQ 2/9 Scores  PHQ - 2 Score 0 0 0 0 0 0 0  PHQ- 9 Score 0          Fall Risk     03/22/2024   12:30 PM 09/13/2023    1:26 PM 03/30/2023    2:06 PM 01/06/2023    2:34 PM 12/06/2022    1:49 PM  Fall  Risk   Falls in the past year? 0 0 0 0 0  Number falls in past yr: 0 0 0 0 0  Injury with Fall? 0 0 0 0 0  Risk for fall due to :  No Fall Risks No Fall Risks No Fall Risks No Fall Risks  Follow up Falls evaluation completed;Falls prevention discussed Falls evaluation completed       MEDICARE RISK AT HOME:  Medicare Risk at Home Any stairs in or around the home?: (Patient-Rptd) Yes If so, are there any without handrails?: (Patient-Rptd) No Home free of loose throw rugs in walkways, pet beds, electrical cords, etc?: (Patient-Rptd) Yes Adequate lighting in your home to reduce risk of falls?: (Patient-Rptd) Yes Life alert?: (Patient-Rptd) No Use of a cane, walker or w/c?: (Patient-Rptd) No Grab bars in the bathroom?: (Patient-Rptd) Yes Shower chair or bench in shower?: (Patient-Rptd) Yes Elevated toilet seat or a handicapped toilet?:  (Patient-Rptd) No  TIMED UP AND GO:  Was the test performed?  No  Cognitive Function: Declined/Normal: No cognitive concerns noted by patient or family. Patient alert, oriented, able to answer questions appropriately and recall recent events. No signs of memory loss or confusion.        Immunizations Immunization History  Administered Date(s) Administered    sv, Bivalent, Protein Subunit Rsvpref,pf (Abrysvo) 04/01/2022   Fluad Quad(high Dose 65+) 05/10/2019, 05/13/2022   Fluzone Influenza virus vaccine,trivalent (IIV3), split virus 05/16/2019   Hepatitis A 08/02/2003   Hepatitis A, Adult 08/02/2003   Hepatitis A, Ped/Adol-2 Dose 08/02/2003   INFLUENZA, HIGH DOSE SEASONAL PF 04/16/2015, 05/12/2016, 05/24/2017, 06/01/2018   Influenza Split 04/30/2012   Influenza Whole 04/17/2008, 04/29/2010   Influenza, Seasonal, Injecte, Preservative Fre 05/01/2013, 05/01/2014, 05/16/2019   Influenza,inj,Quad PF,6+ Mos 05/01/2013, 05/01/2014   Influenza,inj,quad, With Preservative 05/16/2019   Influenza-Unspecified 05/01/2013, 05/01/2014, 05/29/2017, 04/01/2020, 04/22/2021   Moderna Covid-19 Fall Seasonal Vaccine 47yrs & older 05/13/2022   Moderna Covid-19 Vaccine Bivalent Booster 9yrs & up 04/16/2021, 05/13/2022   Moderna Sars-Covid-2 Vaccination 08/26/2019, 08/31/2019, 09/23/2019, 09/28/2019, 03/27/2020, 10/28/2020   Pneumococcal Conjugate-13 05/14/2008, 04/16/2015   Pneumococcal Polysaccharide-23 05/14/2008, 03/24/2021   Respiratory Syncytial Virus Vaccine,Recomb Aduvanted(Arexvy) 04/01/2022   Tdap 03/18/2011, 05/12/2016   Zoster Recombinant(Shingrix) 12/03/2016, 03/22/2017   Zoster, Live 04/17/2008, 12/04/2016, 03/22/2017    Screening Tests Health Maintenance  Topic Date Due   Diabetic kidney evaluation - Urine ACR  Never done   Medicare Annual Wellness (AWV)  03/30/2023   COVID-19 Vaccine (10 - Moderna risk 2024-25 season) 10/22/2023   INFLUENZA VACCINE  03/01/2024   HEMOGLOBIN A1C   03/12/2024   Diabetic kidney evaluation - eGFR measurement  09/12/2024   OPHTHALMOLOGY EXAM  11/01/2024   DTaP/Tdap/Td (3 - Td or Tdap) 05/12/2026   Pneumococcal Vaccine: 50+ Years  Completed   Hepatitis C Screening  Completed   Zoster Vaccines- Shingrix  Completed   HPV VACCINES  Aged Out   Meningococcal B Vaccine  Aged Out   FOOT EXAM  Discontinued   Colonoscopy  Discontinued    Health Maintenance  Health Maintenance Due  Topic Date Due   Diabetic kidney evaluation - Urine ACR  Never done   Medicare Annual Wellness (AWV)  03/30/2023   COVID-19 Vaccine (10 - Moderna risk 2024-25 season) 10/22/2023   INFLUENZA VACCINE  03/01/2024   HEMOGLOBIN A1C  03/12/2024   Health Maintenance Items Addressed: Labs Ordered: UACR and due for a A1C check, See Nurse Notes at the end of this note  Additional Screening:  Vision Screening: Recommended annual ophthalmology exams for early detection of glaucoma and other disorders of the eye. Would you like a referral to an eye doctor? No    Dental Screening: Recommended annual dental exams for proper oral hygiene  Community Resource Referral / Chronic Care Management: CRR required this visit?  No   CCM required this visit?  No   Plan:    I have personally reviewed and noted the following in the patient's chart:   Medical and social history Use of alcohol, tobacco or illicit drugs  Current medications and supplements including opioid prescriptions. Patient is not currently taking opioid prescriptions. Functional ability and status Nutritional status Physical activity Advanced directives List of other physicians Hospitalizations, surgeries, and ER visits in previous 12 months Vitals Screenings to include cognitive, depression, and falls Referrals and appointments  In addition, I have reviewed and discussed with patient certain preventive protocols, quality metrics, and best practice recommendations. A written personalized care plan  for preventive services as well as general preventive health recommendations were provided to patient.   Keya Wynes L Saleema Weppler, CMA   03/26/2024   After Visit Summary: (MyChart) Due to this being a telephonic visit, the after visit summary with patients personalized plan was offered to patient via MyChart   Notes: Patient is due for an A1c check and a UACR, which has been ordered today for next office visit.  Patient had no concerns to address today.

## 2024-03-26 NOTE — Patient Instructions (Signed)
 Timothy Dickson , Thank you for taking time out of your busy schedule to complete your Annual Wellness Visit with me. I enjoyed our conversation and look forward to speaking with you again next year. I, as well as your care team,  appreciate your ongoing commitment to your health goals. Please review the following plan we discussed and let me know if I can assist you in the future. Your Game plan/ To Do List    Follow up Visits: We will see or speak with you next year for your Next Medicare AWV with our clinical staff Have you seen your provider in the last 6 months (3 months if uncontrolled diabetes)? Yes.  Next office visit on 04/25/2024.  Clinician Recommendations:  Aim for 30 minutes of exercise or brisk walking, 6-8 glasses of water , and 5 servings of fruits and vegetables each day. You are due for a A1C check and a kidney evaluation, which you will get done during your next office visit.      This is a list of the screenings recommended for you:  Health Maintenance  Topic Date Due   Yearly kidney health urinalysis for diabetes  Never done   COVID-19 Vaccine (10 - Moderna risk 2024-25 season) 10/22/2023   Flu Shot  03/01/2024   Hemoglobin A1C  03/12/2024   Yearly kidney function blood test for diabetes  09/12/2024   Eye exam for diabetics  11/01/2024   Medicare Annual Wellness Visit  03/26/2025   DTaP/Tdap/Td vaccine (3 - Td or Tdap) 05/12/2026   Pneumococcal Vaccine for age over 6  Completed   Hepatitis C Screening  Completed   Zoster (Shingles) Vaccine  Completed   HPV Vaccine  Aged Out   Meningitis B Vaccine  Aged Out   Complete foot exam   Discontinued   Colon Cancer Screening  Discontinued    Advanced directives: (Copy Requested) Please bring a copy of your health care power of attorney and living will to the office to be added to your chart at your convenience. You can mail to [facility] [address] [place], [place] [address] or email to  [email or web address] Advance Care Planning is important because it:  [x]  Makes sure you receive the medical care that is consistent with your values, goals, and preferences  [x]  It provides guidance to your family and loved ones and reduces their decisional burden about whether or not they are making the right decisions based on your wishes.  Follow the link provided in your after visit summary or read over the paperwork we have mailed to you to help you started getting your Advance Directives in place. If you need assistance in completing these, please reach out to us  so that we can help you!  See attachments for Preventive Care and Fall Prevention Tips.

## 2024-03-28 DIAGNOSIS — M25521 Pain in right elbow: Secondary | ICD-10-CM | POA: Diagnosis not present

## 2024-04-01 ENCOUNTER — Encounter: Payer: Medicare Other | Admitting: Family Medicine

## 2024-04-03 ENCOUNTER — Other Ambulatory Visit (HOSPITAL_COMMUNITY): Payer: Self-pay

## 2024-04-03 ENCOUNTER — Ambulatory Visit: Attending: Internal Medicine | Admitting: Internal Medicine

## 2024-04-03 VITALS — BP 127/70 | HR 62 | Ht 72.0 in | Wt 202.6 lb

## 2024-04-03 DIAGNOSIS — E7800 Pure hypercholesterolemia, unspecified: Secondary | ICD-10-CM

## 2024-04-03 DIAGNOSIS — I351 Nonrheumatic aortic (valve) insufficiency: Secondary | ICD-10-CM

## 2024-04-03 DIAGNOSIS — I4819 Other persistent atrial fibrillation: Secondary | ICD-10-CM | POA: Diagnosis not present

## 2024-04-03 MED ORDER — OXYCODONE HCL 5 MG PO TABS
5.0000 mg | ORAL_TABLET | ORAL | 0 refills | Status: DC
  Filled 2024-04-03: qty 30, 5d supply, fill #0

## 2024-04-03 NOTE — Patient Instructions (Signed)
 Medication Instructions:  Your physician recommends that you continue on your current medications as directed. Please refer to the Current Medication list given to you today.  *If you need a refill on your cardiac medications before your next appointment, please call your pharmacy*  Lab Work: NONE  If you have labs (blood work) drawn today and your tests are completely normal, you will receive your results only by: MyChart Message (if you have MyChart) OR A paper copy in the mail If you have any lab test that is abnormal or we need to change your treatment, we will call you to review the results.  Testing/Procedures: NONE  Follow-Up: At [facility], you and your health needs are our priority.  As part of our continuing mission to provide you with exceptional heart care, our providers are all part of one team.  This team includes your primary Cardiologist (physician) and Advanced Practice Providers or APPs (Physician Assistants and Nurse Practitioners) who all work together to provide you with the care you need, when you need it.  Your next appointment:   1 year(s)  Provider:   Stanly DELENA Leavens, MD

## 2024-04-03 NOTE — Progress Notes (Signed)
 Cardiology Office Note:  .    Date:  04/03/2024  ID:  Timothy Dickson, DOB 07/25/1946, MRN [id number] PCP: Elnor Lauraine BRAVO, NP  [facility] Providers Cardiologist:  Stanly DELENA Leavens, MD Electrophysiologist:  Timothy Rakers, MD (Inactive)     CC: Follow up   History of Present Illness: .    Timothy Dickson is a 78 y.o. male with atrial fibrillation and mild aortic dilation who presents for routine cardiovascular follow-up.  He has a history of atrial fibrillation and underwent an ablation procedure. He is currently on flecainide  and has been in rhythm the majority of the time, with no episodes of atrial fibrillation for the past two and a half years. He is also on Eliquis  and Coreg .  He has mild aortic dilation and mild aortic regurgitation, with the last imaging showing an aortic measurement of 42 mm. His aorta has been stable over the years, with measurements consistently under 45 mm. No chest pain or breathing issues during physical activities such as gardening and biking.  He has hyperlipidemia, which is well controlled with Repatha , maintaining his LDL under 55 mg/dL. 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  He experiences dizziness and has a history of cervical stenosis diagnosed at C3, C4, and C5. Recently, he experienced a clicking sensation in his neck followed by room spinning, which occurs when he lies down or gets up at night.  He maintains an active lifestyle, engaging in activities like gardening and biking, with no significant lifestyle changes or new symptoms.  Discussed the use of AI scribe software for clinical note transcription with the patient, who gave verbal consent to proceed.   Relevant histories: .  Social  - Engages in gardening and biking occasionally. - wife was planned for visit today to establish care; she is under the weather recovering for a medication interaction - 2024: limited AF burden; ILR  is now removed ROS: As per HPI.   Studies Reviewed: .     Cardiac Studies & Procedures   ______________________________________________________________________________________________   STRESS TESTS  EXERCISE TOLERANCE TEST (ETT) 04/17/2018  Interpretation Summary  Blood pressure demonstrated a normal response to exercise.  There was no ST segment deviation noted during stress.  ETT with good exercise tolerance (9:09); no chest pain; normal blood pressure response; no ST changes; negative adequate exercise tolerance test; Duke treadmill score 9.   ECHOCARDIOGRAM  ECHOCARDIOGRAM COMPLETE 02/07/2024  Narrative ECHOCARDIOGRAM REPORT    Patient Name:   Timothy Dickson Date of Exam: 02/07/2024 Medical Rec #:  [id number]      Height:       72.0 in Accession #:    [id number]     Weight:       205.0 lb Date of Birth:  02-20-1946      BSA:          2.153 m Patient Age:    78 years       BP:           123/86 mmHg Patient Gender: M              HR:           48 bpm. Exam Location:  [address]  Procedure: 2D Echo, Cardiac Doppler and Color Doppler (Both Spectral and Color Flow Doppler were utilized during procedure).  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 Loop  recorder, Arrythmias:Atrial Fibrillation and Tachycardia, Signs/Symptoms:Hypertensive Heart Disease; Risk Factors:Hypertension. Ascending Aorta Aneurysm.  Sonographer:    Cherene Ravens Referring Phys: [id number] SCOTT T WEAVER  IMPRESSIONS   1. Left ventricular ejection fraction, by estimation, is 55 to 60%. The left ventricle has normal function. The left ventricle has no regional wall motion abnormalities. Left ventricular diastolic function could not be evaluated. 2. Right ventricular systolic function is normal. The right ventricular size is normal. There is normal pulmonary artery systolic pressure. The estimated right ventricular systolic pressure  is 31.1 mmHg. 3. Left atrial size was mildly dilated. 4. The mitral valve is normal in structure. Moderate mitral valve regurgitation. No evidence of mitral stenosis. 5. Tricuspid valve regurgitation is moderate. 6. The aortic valve is normal in structure. Aortic valve regurgitation is mild. No aortic stenosis is present. Aortic valve Vmax measures 1.06 m/s. 7. Aortic dilatation noted. There is mild dilatation of the aortic root, measuring 42 mm. There is mild dilatation of the ascending aorta, measuring 38 mm. 8. The inferior vena cava is normal in size with greater than 50% respiratory variability, suggesting right atrial pressure of 3 mmHg.  FINDINGS Left Ventricle: Left ventricular ejection fraction, by estimation, is 55 to 60%. The left ventricle has normal function. The left ventricle has no regional wall motion abnormalities. The left ventricular internal cavity size was normal in size. There is no left ventricular hypertrophy. Left ventricular diastolic function could not be evaluated.  Right Ventricle: The right ventricular size is normal. No increase in right ventricular wall thickness. Right ventricular systolic function is normal. There is normal pulmonary artery systolic pressure. The tricuspid regurgitant velocity is 2.65 m/s, and with an assumed right atrial pressure of 3 mmHg, the estimated right ventricular systolic pressure is 31.1 mmHg.  Left Atrium: Left atrial size was mildly dilated.  Right Atrium: Right atrial size was normal in size.  Pericardium: There is no evidence of pericardial effusion.  Mitral Valve: The mitral valve is normal in structure. Moderate mitral valve regurgitation. No evidence of mitral valve stenosis. MV peak gradient, 3.0 mmHg. The mean mitral valve gradient is 1.0 mmHg.  Tricuspid Valve: The tricuspid valve is normal in structure. Tricuspid valve regurgitation is moderate . No evidence of tricuspid stenosis.  Aortic Valve: The aortic valve is  normal in structure. Aortic valve regurgitation is mild. No aortic stenosis is present. Aortic valve peak gradient measures 4.5 mmHg.  Pulmonic Valve: The pulmonic valve was normal in structure. Pulmonic valve regurgitation is mild to moderate. No evidence of pulmonic stenosis.  Aorta: Aortic dilatation noted. There is mild dilatation of the aortic root, measuring 42 mm. There is mild dilatation of the ascending aorta, measuring 38 mm.  Venous: The inferior vena cava is normal in size with greater than 50% respiratory variability, suggesting right atrial pressure of 3 mmHg.  IAS/Shunts: No atrial level shunt detected by color flow Doppler.   LEFT VENTRICLE PLAX 2D LVIDd:         5.50 cm LVIDs:         4.00 cm LV PW:         1.10 cm LV IVS:        1.00 cm LVOT diam:     2.20 cm LV SV:         66 LV SV Index:   31 LVOT Area:     3.80 cm   RIGHT VENTRICLE            IVC RV Basal diam:  3.60 cm    IVC diam: 1.90 cm RV Mid diam:    3.00 cm TAPSE (M-mode): 2.8 cm RVSP:           31.1 mmHg  LEFT ATRIUM             Index        RIGHT ATRIUM           Index LA diam:        4.40 cm 2.04 cm/m   RA Pressure: 3.00 mmHg LA Vol (A2C):   79.1 ml 36.74 ml/m  RA Area:     19.40 cm LA Vol (A4C):   76.7 ml 35.62 ml/m  RA Volume:   48.40 ml  22.48 ml/m LA Biplane Vol: 77.6 ml 36.04 ml/m AORTIC VALVE AV Area (Vmax): 2.67 cm AV Vmax:        106.00 cm/s AV Peak Grad:   4.5 mmHg LVOT Vmax:      74.40 cm/s LVOT Vmean:     48.700 cm/s LVOT VTI:       0.174 m  AORTA Ao Root diam: 3.80 cm Ao Asc diam:  4.20 cm  MITRAL VALVE                 TRICUSPID VALVE MV Area VTI:  1.76 cm       TR Peak grad:   28.1 mmHg MV Peak grad: 3.0 mmHg       TR Vmax:        265.00 cm/s MV Mean grad: 1.0 mmHg       Estimated RAP:  3.00 mmHg MV Vmax:      0.86 m/s       RVSP:           31.1 mmHg MV Vmean:     42.4 cm/s MR Peak grad:   109.8 mmHg   SHUNTS MR Mean grad:   72.0 mmHg    Systemic VTI:  0.17  m MR Vmax:        524.00 cm/s  Systemic Diam: 2.20 cm MR Vmean:       405.0 cm/s MR PISA:        1.57 cm MR PISA Radius: 0.50 cm  Wilbert Bihari MD Electronically signed by Wilbert Bihari MD Signature Date/Time: 02/07/2024/2:56:18 PM    Final   TEE  ECHO TEE 10/11/2018  Narrative TRANSESOPHOGEAL ECHO REPORT    Patient Name:   AMAN BONET Date of Exam: 10/11/2018 Medical Rec #:  [id number]      Height:       71.0 in Accession #:    [id number]     Weight:       215.0 lb Date of Birth:  09/03/1945      BSA:          2.17 m Patient Age:    73 years       BP:           113/78 mmHg Patient Gender: M              HR:           55 bpm. Exam Location:  Inpatient   Procedure: Transesophageal Echo  Indications:     427.31 atrial fibrillation  History:         Patient has prior history of Echocardiogram examinations, most recent 03/23/2018. Risk Factors: Hypertension and Diabetes. Hx of Atrial Tachycardia.  Sonographer:     Elinor Fresh RDCS (AE) Referring Phys:  [phone]  BRIDGETTE CHRISTOPHER Diagnosing Phys: Shelda Bruckner MD    PROCEDURE: Normal Transesophogeal exam. No source of intracardiac mass. No evidence of intracardiac thrombus. Consent was requested emergently by emergency room physicain. After discussion of the risks and benefits of a TEE, an informed consent was obtained from the patient. Local oropharyngeal anesthetic was provided with cetacaine. Patients was under conscious sedation during this procedure. Anesthetic was administered intravenously by performing Physician: 75mcg of Fentanyl , 8mg  of Versed . The transesophogeal probe was passed through the esophogus of the patient. Imaged were obtained with the patient in a left lateral decubitus position. Image quality was excellent. The patient's vital signs; including heart rate, blood pressure, and oxygen saturation; remained stable throughout the procedure. The patient developed no complications during the  procedure.  IMPRESSIONS   1. The left ventricle has low normal systolic function, with an ejection fraction of 50-55%. The cavity size was normal. No evidence of left ventricular regional wall motion abnormalities. 2. The right ventricle has normal systolc function. The cavity was normal. There is no increase in right ventricular wall thickness. 3. Left atrial size was mildly dilated. 4. Right atrial size was mildly dilated. 5. No evidence of mitral valve stenosis. No mitral valve vegetation visualized. 6. The aortic valve is tricuspid. 7. There is evidence of mild plaque in the descending aorta.  SUMMARY  No LA/LAA or RA/RAA thrombus seen. Normal LAA velocities. Patent pulmonary veins with normal flow. FINDINGS Left Ventricle: The left ventricle has low normal systolic function, with an ejection fraction of 50-55%. The cavity size was normal. There is no increase in left ventricular wall thickness. No evidence of left ventricular regional wall motion abnormalities. Right Ventricle: The right ventricle has normal systolic function. The cavity was normal. There is no increase in right ventricular wall thickness. Left Atrium: Left atrial size was mildly dilated. Right Atrium: Right atrial size was mildly dilated. Right atrial pressure is estimated at 8 mmHg. Interatrial Septum: No atrial level shunt detected by color flow Doppler. Pericardium: There is no evidence of pericardial effusion. Mitral Valve: The mitral valve is normal in structure. Mitral valve regurgitation is mild by color flow Doppler. No evidence of mitral valve stenosis. There is no evidence of mitral valve vegetation. Tricuspid Valve: The tricuspid valve was normal in structure. Tricuspid valve regurgitation is mild by color flow Doppler. No TV vegetation was visualized. Aortic Valve: The aortic valve is tricuspid Aortic valve regurgitation was not visualized by color flow Doppler. There is no evidence of aortic valve  stenosis. There is no evidence of a vegetation on the aortic valve. Pulmonic Valve: The pulmonic valve was normal in structure. Pulmonic valve regurgitation is trivial by color flow Doppler. No evidence of pulmonic stenosis. Aorta: There is evidence of mild plaque in the descending aorta.  LV Wall Scoring:  RIGHT VENTRICLE RVSP:           37.4 mmHg  RIGHT ATRIUM RA Pressure: 8 mmHg TRICUSPID VALVE TR Peak grad:   29.4 mmHg TR Vmax:        271.00 cm/s RVSP:           37.4 mmHg   Shelda Bruckner MD Electronically signed by Shelda Bruckner MD Signature Date/Time: 10/12/2018/7:53:42 AM    Final    CT SCANS  CT CARDIAC SCORING (SELF PAY ONLY) 01/14/2016  Addendum 01/14/2016  9:32 AM ADDENDUM REPORT: 01/14/2016 09:29 CLINICAL DATA:  Risk stratification EXAM: Coronary Calcium  Score TECHNIQUE: The patient was scanned on a Siemens Somatom 64 slice scanner.  Axial non-contrast 3 mm slices were carried out through the heart. The data set was analyzed on a dedicated work station and scored using the Agatson method. FINDINGS: Non-cardiac: See separate report from [facility]. Ascending Aorta: Aneurysmal dilatation of the ascending aorta measuring 44 mm in axial views. No calcifications. Pericardium: Normal. Coronary arteries:  Normal origin. IMPRESSION: 1. Coronary calcium  score of 139. This was 14 percentile for age and sex matched control. 2. Aneurysmal dilatation of the ascending aorta measuring 44 mm in axial views. A dedicated CTA/MRA chest is recommended for further evaluation. Leim Moose Electronically Signed By: Leim Moose On: 01/14/2016 09:29  Narrative EXAM: OVER-READ INTERPRETATION  CT CHEST  The following report is an over-read performed by radiologist Dr. Pinkie Pebbles of [facility] on 01/14/2016. This over-read does not include interpretation of cardiac or coronary anatomy or pathology. The coronary calcium   score interpretation by the cardiologist is attached.  COMPARISON:  Chest radiographs dated 05/22/2012  FINDINGS: Visualized lungs are clear. No suspicious pulmonary nodules. No focal consolidation. No pleural effusion or pneumothorax.  No suspicious mediastinal lymphadenopathy.  Visualized upper abdomen is grossly unremarkable.  Degenerative changes of the visualized thoracic spine.  IMPRESSION: No significant extracardiac findings.  Electronically Signed: By: Pinkie Pebbles M.D. On: 01/14/2016 08:59   CARDIAC MRI  MR CARDIAC MORPHOLOGY W WO CONTRAST 02/14/2017  Narrative CLINICAL DATA:  78 year old male with known ascending aortic aneurysm. 1 year follow up. Evaluate for size of ascending aortic aneurysm and possible aortic insufficiency.  EXAM: CARDIAC MRI  TECHNIQUE: The patient was scanned on a 1.5 Tesla GE magnet. A dedicated cardiac coil was used. Functional imaging was done using Fiesta sequences. 2,3, and 4 chamber views were done to assess for RWMA's. Modified Simpson's rule using a short axis stack was used to calculate an ejection fraction on a dedicated work [employer]. The patient received 30 cc of Multihance . After 10 minutes inversion recovery sequences were used to assess for infiltration and scar tissue.  CONTRAST:  30 cc  of Multihance   FINDINGS: 1. Normal left ventricular size, with mild upper septal hypertrophy and normal systolic function (LVEF = 56%). There is hypokinesis in the basal inferolateral wall.  There is late gadolinium enhancement in the basal inferolateral (75-100%) and mid inferolateral (50-75%) walls.  LVEDD:  44 mm  LVESD:  29 mm  LVEDV:  98 ml  LVESV:  43 ml  SV:  55 ml  CO:  3.0 L/min  Myocardial mass:  121 g  2. Normal right ventricular size, thickness and systolic function (LVEF = 61%). There are no regional wall motion abnormalities.  3.  Normal left and right atrial size.  4.  Mildly dilated aortic root at sinuses (43 mm), normal sinotubular junction (34 mm) and mildly dilated ascending aorta (42 mm).  5.  Mildly dilated pulmonary artery (33 mm).  6. Mitral tricuspid, trivial aortic, mitral and pulmonic regurgitation. Trileaflet aortic valve.  7.  Normal pericardium.  No pericardial effusion.  IMPRESSION: 1. Normal left ventricular size, with mild upper septal hypertrophy and normal systolic function (LVEF = 56%). There is hypokinesis in the basal inferolateral wall.  There is late gadolinium enhancement in the basal inferolateral (75-100%) and mid inferolateral (50-75%) walls.  2. Normal right ventricular size, thickness and systolic function (LVEF = 61%). There are no regional wall motion abnormalities.  3. Mildly dilated aortic root at sinuses (43 mm), normal sinotubular junction (34 mm) and mildly dilated ascending aorta (42 mm).  4.  Mildly dilated pulmonary artery (33 mm).  5. Mitral tricuspid, trivial aortic, mitral and pulmonic regurgitation. Trileaflet aortic valve.  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  Sizes of the aortic root and ascending aorta are stable.  Leim Moose   Electronically Signed By: Leim Moose On: 02/19/2017 10:24   ______________________________________________________________________________________________      Physical Exam:    VS:  BP 127/70   Pulse 62   Ht 6' (1.829 m)   Wt 202 lb 9.6 oz (91.9 kg)   SpO2 98%   BMI 27.48 kg/m    Wt Readings from Last 3 Encounters:  04/03/24 202 lb 9.6 oz (91.9 kg)  03/26/24 205 lb (93 kg)  03/14/24 205 lb (93 kg)    Gen: no distress   Neck: No JVD Cardiac: No Rubs or Gallops, Diastolic Murmur, RRR +2 radial pulses Respiratory: Clear to auscultation bilaterally, normal effort, normal  respiratory rate GI: Soft, nontender, non-distended  MS: No  edema;  R arm in sling (post ulnar  surgery) Integument: Skin feels warm Neuro:  At time of evaluation, alert and oriented to person/place/time/situation  Psych: Normal affect, patient feels ok   ASSESSMENT AND PLAN: .    Persistent Atrial fibrillation, status post ablation, on flecainide , status post ILR removal Atrial fibrillation is well-controlled post-ablation with no recent episodes. He is on flecainide , which is well-tolerated without adverse effects. The ILR was removed as he has been in rhythm 99% of the time, and there is no evidence of flecainide -associated arrhythmias. - Continue flecainide  - Continue Eliquis  - Continue Coreg   He is largely in SR, has stable and mild aortic disease; he did well with his last, recent surgery.  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 Current measurements remain within the mild category, and there is no indication for aggressive intervention at this time. The condition may remain stable or progress to moderate in his lifetime, but current risk is low. 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 Repatha  is effectively managing cholesterol levels without inducing statin myopathy, which is a significant benefit given his history of statin intolerance. - Continue Repatha   Hypertension Blood pressure is well-controlled with recent measurement at 127/70. Current management appears effective. - Continue hydrochlorothiazide   Longitudinal care: The evaluation and management services provided today reflect the complexity inherent in caring for this patient, including the ongoing  longitudinal relationship and management of multiple chronic conditions and/or the need for care coordination. The visit required a comprehensive assessment and management plan tailored to the patient's unique needs Time was spent addressing not only the acute concerns but also the broader context of the patient's health, including preventive care, chronic disease management, and care coordination as appropriate.  Complex longitudinal is necessary for conditions including:  longitudinal f/u of mild aortic disease avoiding CMR when possible due to discomfort  Stanly Leavens, MD FASE [facility] Cardiologist [facility]  [address] [place], [address] [place], [place] [address] [phone]  11:43 AM

## 2024-04-04 DIAGNOSIS — M25621 Stiffness of right elbow, not elsewhere classified: Secondary | ICD-10-CM | POA: Diagnosis not present

## 2024-04-09 ENCOUNTER — Other Ambulatory Visit (HOSPITAL_COMMUNITY): Payer: Self-pay

## 2024-04-09 DIAGNOSIS — G959 Disease of spinal cord, unspecified: Secondary | ICD-10-CM | POA: Diagnosis not present

## 2024-04-09 DIAGNOSIS — M4802 Spinal stenosis, cervical region: Secondary | ICD-10-CM | POA: Diagnosis not present

## 2024-04-09 DIAGNOSIS — Z96653 Presence of artificial knee joint, bilateral: Secondary | ICD-10-CM | POA: Diagnosis not present

## 2024-04-09 DIAGNOSIS — R531 Weakness: Secondary | ICD-10-CM | POA: Diagnosis not present

## 2024-04-09 DIAGNOSIS — M549 Dorsalgia, unspecified: Secondary | ICD-10-CM | POA: Diagnosis not present

## 2024-04-09 DIAGNOSIS — E1142 Type 2 diabetes mellitus with diabetic polyneuropathy: Secondary | ICD-10-CM | POA: Diagnosis not present

## 2024-04-09 DIAGNOSIS — M47812 Spondylosis without myelopathy or radiculopathy, cervical region: Secondary | ICD-10-CM | POA: Diagnosis not present

## 2024-04-09 DIAGNOSIS — R42 Dizziness and giddiness: Secondary | ICD-10-CM | POA: Diagnosis not present

## 2024-04-09 DIAGNOSIS — M160 Bilateral primary osteoarthritis of hip: Secondary | ICD-10-CM | POA: Diagnosis not present

## 2024-04-09 DIAGNOSIS — F419 Anxiety disorder, unspecified: Secondary | ICD-10-CM | POA: Diagnosis not present

## 2024-04-09 MED ORDER — DIAZEPAM 5 MG PO TABS
5.0000 mg | ORAL_TABLET | ORAL | 0 refills | Status: DC
  Filled 2024-04-09: qty 2, 1d supply, fill #0

## 2024-04-10 ENCOUNTER — Other Ambulatory Visit (HOSPITAL_COMMUNITY): Payer: Self-pay

## 2024-04-12 DIAGNOSIS — M25621 Stiffness of right elbow, not elsewhere classified: Secondary | ICD-10-CM | POA: Diagnosis not present

## 2024-04-14 DIAGNOSIS — G959 Disease of spinal cord, unspecified: Secondary | ICD-10-CM | POA: Diagnosis not present

## 2024-04-14 DIAGNOSIS — M4802 Spinal stenosis, cervical region: Secondary | ICD-10-CM | POA: Diagnosis not present

## 2024-04-14 DIAGNOSIS — M4712 Other spondylosis with myelopathy, cervical region: Secondary | ICD-10-CM | POA: Diagnosis not present

## 2024-04-14 DIAGNOSIS — M47812 Spondylosis without myelopathy or radiculopathy, cervical region: Secondary | ICD-10-CM | POA: Diagnosis not present

## 2024-04-19 DIAGNOSIS — M25621 Stiffness of right elbow, not elsewhere classified: Secondary | ICD-10-CM | POA: Diagnosis not present

## 2024-04-21 ENCOUNTER — Other Ambulatory Visit (HOSPITAL_COMMUNITY): Payer: Self-pay

## 2024-04-23 DIAGNOSIS — M542 Cervicalgia: Secondary | ICD-10-CM | POA: Diagnosis not present

## 2024-04-25 ENCOUNTER — Ambulatory Visit: Payer: Medicare Other | Admitting: Nurse Practitioner

## 2024-04-25 DIAGNOSIS — M542 Cervicalgia: Secondary | ICD-10-CM | POA: Diagnosis not present

## 2024-04-26 DIAGNOSIS — M25621 Stiffness of right elbow, not elsewhere classified: Secondary | ICD-10-CM | POA: Diagnosis not present

## 2024-04-29 DIAGNOSIS — M542 Cervicalgia: Secondary | ICD-10-CM | POA: Diagnosis not present

## 2024-05-02 DIAGNOSIS — M542 Cervicalgia: Secondary | ICD-10-CM | POA: Diagnosis not present

## 2024-05-03 ENCOUNTER — Ambulatory Visit: Admitting: Nurse Practitioner

## 2024-05-03 VITALS — BP 114/64 | HR 57 | Temp 97.8°F | Ht 72.0 in | Wt 202.5 lb

## 2024-05-03 DIAGNOSIS — I10 Essential (primary) hypertension: Secondary | ICD-10-CM | POA: Diagnosis not present

## 2024-05-03 DIAGNOSIS — I7121 Aneurysm of the ascending aorta, without rupture: Secondary | ICD-10-CM | POA: Diagnosis not present

## 2024-05-03 DIAGNOSIS — Z7985 Long-term (current) use of injectable non-insulin antidiabetic drugs: Secondary | ICD-10-CM

## 2024-05-03 DIAGNOSIS — Z85528 Personal history of other malignant neoplasm of kidney: Secondary | ICD-10-CM

## 2024-05-03 DIAGNOSIS — K7469 Other cirrhosis of liver: Secondary | ICD-10-CM

## 2024-05-03 DIAGNOSIS — B1920 Unspecified viral hepatitis C without hepatic coma: Secondary | ICD-10-CM

## 2024-05-03 DIAGNOSIS — E1169 Type 2 diabetes mellitus with other specified complication: Secondary | ICD-10-CM

## 2024-05-03 DIAGNOSIS — E1140 Type 2 diabetes mellitus with diabetic neuropathy, unspecified: Secondary | ICD-10-CM | POA: Diagnosis not present

## 2024-05-03 DIAGNOSIS — M4802 Spinal stenosis, cervical region: Secondary | ICD-10-CM

## 2024-05-03 DIAGNOSIS — E785 Hyperlipidemia, unspecified: Secondary | ICD-10-CM

## 2024-05-03 DIAGNOSIS — M79676 Pain in unspecified toe(s): Secondary | ICD-10-CM

## 2024-05-03 LAB — CBC WITH DIFFERENTIAL/PLATELET
Basophils Absolute: 0.1 K/uL (ref 0.0–0.1)
Basophils Relative: 0.7 % (ref 0.0–3.0)
Eosinophils Absolute: 0.2 K/uL (ref 0.0–0.7)
Eosinophils Relative: 2.1 % (ref 0.0–5.0)
HCT: 44.1 % (ref 39.0–52.0)
Hemoglobin: 14.9 g/dL (ref 13.0–17.0)
Lymphocytes Relative: 19.1 % (ref 12.0–46.0)
Lymphs Abs: 1.6 K/uL (ref 0.7–4.0)
MCHC: 33.8 g/dL (ref 30.0–36.0)
MCV: 93.3 fl (ref 78.0–100.0)
Monocytes Absolute: 0.6 K/uL (ref 0.1–1.0)
Monocytes Relative: 7.4 % (ref 3.0–12.0)
Neutro Abs: 5.9 K/uL (ref 1.4–7.7)
Neutrophils Relative %: 70.7 % (ref 43.0–77.0)
Platelets: 189 K/uL (ref 150.0–400.0)
RBC: 4.73 Mil/uL (ref 4.22–5.81)
RDW: 13.3 % (ref 11.5–15.5)
WBC: 8.4 K/uL (ref 4.0–10.5)

## 2024-05-03 LAB — COMPREHENSIVE METABOLIC PANEL WITH GFR
ALT: 13 U/L (ref 0–53)
AST: 20 U/L (ref 0–37)
Albumin: 4.6 g/dL (ref 3.5–5.2)
Alkaline Phosphatase: 53 U/L (ref 39–117)
BUN: 21 mg/dL (ref 6–23)
CO2: 31 meq/L (ref 19–32)
Calcium: 9.6 mg/dL (ref 8.4–10.5)
Chloride: 97 meq/L (ref 96–112)
Creatinine, Ser: 1.23 mg/dL (ref 0.40–1.50)
GFR: 56.21 mL/min — ABNORMAL LOW (ref >60.00)
Glucose, Bld: 140 mg/dL — ABNORMAL HIGH (ref 70–99)
Potassium: 3.7 meq/L (ref 3.5–5.1)
Sodium: 136 meq/L (ref 135–145)
Total Bilirubin: 0.6 mg/dL (ref 0.2–1.2)
Total Protein: 6.8 g/dL (ref 6.0–8.3)

## 2024-05-03 LAB — PROTIME-INR
INR: 1.4 ratio — ABNORMAL HIGH (ref 0.8–1.0)
Prothrombin Time: 14.2 s — ABNORMAL HIGH (ref 9.6–13.1)

## 2024-05-03 LAB — LIPID PANEL
Cholesterol: 103 mg/dL (ref 0–200)
HDL: 46.8 mg/dL (ref >39.00)
LDL Cholesterol: 29 mg/dL (ref 0–99)
NonHDL: 56.38
Total CHOL/HDL Ratio: 2
Triglycerides: 138 mg/dL (ref 0.0–149.0)
VLDL: 27.6 mg/dL (ref 0.0–40.0)

## 2024-05-03 LAB — HEMOGLOBIN A1C: Hgb A1c MFr Bld: 7.1 % — ABNORMAL HIGH (ref 4.6–6.5)

## 2024-05-03 LAB — MICROALBUMIN / CREATININE URINE RATIO
Creatinine,U: 51.1 mg/dL
Microalb Creat Ratio: 19.5 mg/g (ref 0.0–30.0)
Microalb, Ur: 1 mg/dL (ref 0.0–1.9)

## 2024-05-03 NOTE — Assessment & Plan Note (Signed)
 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 - Order updated comprehensive metabolic panel -Continue to follow with GI

## 2024-05-03 NOTE — Assessment & Plan Note (Signed)
 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 - Recheck A1c today. - Check urine for albuminuria. - Continue Repatha  - Check lipid panel

## 2024-05-03 NOTE — Progress Notes (Signed)
 Established Patient Office Visit  Subjective   Patient ID: Timothy Dickson, male    DOB: 1946/03/17  Age: 78 y.o. MRN: [id number]  Chief Complaint  Patient presents with   Diabetes    Discussed the use of AI scribe software for clinical note transcription with the patient, who gave verbal consent to proceed.  History of Present Illness Timothy Dickson is a 78 year old male who presents for an chronic disease management  Glycemic control and neuropathy - Type 2 diabetes managed through diet - Last hemoglobin A1c was 7.3 in February - Due for A1c recheck today - Neuropathy present, managed with gabapentin   Hyperlipidemia - Hyperlipidemia managed with Repatha  - Last LDL was 38 - Due for lipid panel update today  Hypertension and cardiovascular risk - Hypertension managed with carvedilol  and hydrochlorothiazide  - Ascending aortic aneurysm monitored with routine imaging, no enlargement based on last scan  Renal function and history of nephrectomy - Stable kidney function with GFR in the 60s and creatinine of 1.10 - 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, no recurrence of malignancy  Chronic liver disease - 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 - Stable liver enzymes - Follows with GI  Orthostatic dizziness - Dizziness when lying down and upon waking - Episodes last 10-15 seconds - Questioning if this is related to known cervical spinal stenosis - Cervical spinal stenosis - Currently undergoing physical therapy - Managed by [facility]  Postoperative status: ulnar nerve surgery - Underwent right ulnar nerve surgery six weeks ago - Improved strength in right arm - Persistent nocturnal pain  Anticoagulation and bruising - On Eliquis  for anticoagulation (a-fib) - Bruise on left second toe, likely related to anticoagulant use - Did not feel any pain/trauma at time of bruising, possibly didn't feel this due to  neuropathy - No pain associated with the bruise      ROS: see HPI    Objective:     BP 114/64   Pulse (!) 57   Temp 97.8 F (36.6 C) (Temporal)   Ht 6' (1.829 m)   Wt 202 lb 8 oz (91.9 kg)   SpO2 97%   BMI 27.46 kg/m  BP Readings from Last 3 Encounters:  05/03/24 114/64  04/03/24 127/70  03/14/24 125/71   Wt Readings from Last 3 Encounters:  05/03/24 202 lb 8 oz (91.9 kg)  04/03/24 202 lb 9.6 oz (91.9 kg)  03/26/24 205 lb (93 kg)      Physical Exam Vitals reviewed.  Constitutional:      Appearance: Normal appearance.  HENT:     Head: Normocephalic and atraumatic.  Cardiovascular:     Rate and Rhythm: Regular rhythm. Bradycardia present.     Pulses:          Dorsalis pedis pulses are 2+ on the left side.  Pulmonary:     Effort: Pulmonary effort is normal.  Musculoskeletal:     Cervical back: Neck supple.     Left foot: Normal range of motion.       Feet:  Feet:     Left foot:     Skin integrity: No ulcer, blister, erythema or warmth.     Toenail Condition: Left toenails are normal.  Skin:    General: Skin is warm and dry.  Neurological:     Mental Status: He is alert and oriented to person, place, and time.  Psychiatric:        Mood and  Affect: Mood normal.        Behavior: Behavior normal.        Thought Content: Thought content normal.        Judgment: Judgment normal.      No results found for any visits on 05/03/24.    The ASCVD Risk score (Arnett DK, et al., 2019) failed to calculate for the following reasons:   The valid total cholesterol range is 130 to 320 mg/dL    Assessment & Plan:   Problem List Items Addressed This Visit       Cardiovascular and Mediastinum   Essential hypertension   Hypertension Hypertension controlled with carvedilol  and hydrochlorothiazide . BP 114/64.      Relevant Orders   Microalbumin / creatinine urine ratio   Hemoglobin A1c   Lipid panel   Comprehensive metabolic panel with GFR   CBC with  Differential/Platelet   Protime-INR   Ascending aortic aneurysm   Ascending aortic aneurysm/ectasia Ascending aortic aneurysm/ectasia stable, monitored annually.      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 - Order updated comprehensive metabolic panel -Continue to follow with GI      Relevant Orders   Microalbumin / creatinine urine ratio   Hemoglobin A1c   Lipid panel   Comprehensive metabolic panel with GFR   CBC with Differential/Platelet   Protime-INR     Endocrine   Dyslipidemia associated with type 2 diabetes mellitus (HCC)   Type 2 diabetes mellitus with diabetic neuropathy and associated HLD Type 2 diabetes mellitus, diet controlled, A1c 7.3, with neuropathy. - Recheck A1c today. - Check urine for albuminuria. - Continue Repatha  - Check lipid panel      Relevant Orders   Microalbumin / creatinine urine ratio   Hemoglobin A1c   Lipid panel   Comprehensive metabolic panel with GFR   CBC with Differential/Platelet   Protime-INR   Type 2 diabetes mellitus with diabetic neuropathy, without long-term current use of insulin  (HCC)   Type 2 diabetes mellitus with diabetic neuropathy and associated HLD Type 2 diabetes mellitus, diet controlled, A1c 7.3, with neuropathy. - Recheck A1c today. - Check urine for albuminuria. - Continue Repatha  - Check lipid panel      Relevant Orders   Microalbumin / creatinine urine ratio   Hemoglobin A1c   Lipid panel   Comprehensive metabolic panel with GFR   CBC with Differential/Platelet   Protime-INR     Other   History of renal cell cancer s/p right nephrectomy 1999 - Primary (Chronic)   Relevant  Orders   Microalbumin / creatinine urine ratio   Hemoglobin A1c   Lipid panel   Comprehensive metabolic panel with GFR   CBC with Differential/Platelet   Protime-INR   Cervical spinal stenosis   Spinal stenosis, cervical region Cervical spinal stenosis at C5-C6, intermittent dizziness.  - Continue physical therapy. - Follow up with [facility] on November 10th. - [facility] further evaluation as prescribed by neurosurgeon      Relevant Orders   Microalbumin / creatinine urine ratio   Hemoglobin A1c   Lipid panel   Comprehensive metabolic panel with GFR   CBC  with Differential/Platelet   Protime-INR   Toe pain    Left second toe contusion on anticoagulation Left second toe contusion, likely trauma, on Eliquis , no pain or swelling. - Monitor for signs of infection such as increased redness, swelling, or drainage.     Assessment and Plan Assessment & Plan Type 2 diabetes mellitus with diabetic neuropathy and associated HLD Type 2 diabetes mellitus, diet controlled, A1c 7.3, with neuropathy. - Recheck A1c today. - Check urine for albuminuria. - Continue Repatha  - Check lipid panel  Hypertension Hypertension controlled with carvedilol  and hydrochlorothiazide . BP 114/64.  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 - Order updated comprehensive metabolic panel -Continue to follow with GI  Spinal stenosis, cervical region Cervical spinal stenosis at C5-C6, intermittent dizziness.  - Continue physical therapy. - Follow up with [facility] on November 10th. - Undergo CT scan at [facility] for further evaluation as prescribed by neurosurgeon  History of renal cell carcinoma, status post right nephrectomy Renal cell carcinoma post-nephrectomy in 1999, no recurrence, no routine imaging.  Ascending aortic aneurysm/ectasia Ascending aortic aneurysm/ectasia stable, monitored annually.  Left  second toe contusion on anticoagulation Left second toe contusion, likely trauma, on Eliquis , no pain or swelling. - Monitor for signs of infection such as increased redness, swelling, or drainage.   I personally spent a total of 39 minutes in the care of the patient today including preparing to see the patient, getting/reviewing separately obtained history, performing a medically appropriate exam/evaluation, counseling and educating, placing orders, referring and communicating with other health care professionals, and documenting clinical information in the EHR.  Return in about 3 months (around 08/03/2024) for F/U with Gayanne Prescott.    Lauraine FORBES Pereyra, NP

## 2024-05-03 NOTE — Assessment & Plan Note (Signed)
 Hypertension Hypertension controlled with carvedilol  and hydrochlorothiazide . BP 114/64.

## 2024-05-03 NOTE — Assessment & Plan Note (Signed)
  Left second toe contusion on anticoagulation Left second toe contusion, likely trauma, on Eliquis , no pain or swelling. - Monitor for signs of infection such as increased redness, swelling, or drainage.

## 2024-05-03 NOTE — Assessment & Plan Note (Signed)
 Spinal stenosis, cervical region Cervical spinal stenosis at C5-C6, intermittent dizziness.  - Continue physical therapy. - Follow up with [facility] on November 10th. - Undergo CT scan at [facility] for further evaluation as prescribed by neurosurgeon

## 2024-05-03 NOTE — Assessment & Plan Note (Signed)
 Ascending aortic aneurysm/ectasia Ascending aortic aneurysm/ectasia stable, monitored annually.

## 2024-05-04 ENCOUNTER — Ambulatory Visit: Payer: Self-pay | Admitting: Nurse Practitioner

## 2024-05-04 DIAGNOSIS — R944 Abnormal results of kidney function studies: Secondary | ICD-10-CM

## 2024-05-05 ENCOUNTER — Other Ambulatory Visit: Payer: Self-pay | Admitting: Internal Medicine

## 2024-05-07 ENCOUNTER — Other Ambulatory Visit (HOSPITAL_COMMUNITY): Payer: Self-pay

## 2024-05-07 DIAGNOSIS — M542 Cervicalgia: Secondary | ICD-10-CM | POA: Diagnosis not present

## 2024-05-07 MED ORDER — HYDROCHLOROTHIAZIDE 25 MG PO TABS
25.0000 mg | ORAL_TABLET | Freq: Every day | ORAL | 3 refills | Status: AC
  Filled 2024-05-07: qty 90, 90d supply, fill #0
  Filled 2024-07-28: qty 90, 90d supply, fill #1

## 2024-05-07 MED ORDER — CARVEDILOL 6.25 MG PO TABS
6.2500 mg | ORAL_TABLET | Freq: Two times a day (BID) | ORAL | 3 refills | Status: AC
  Filled 2024-05-07: qty 180, 90d supply, fill #0
  Filled 2024-07-28: qty 180, 90d supply, fill #1

## 2024-05-09 DIAGNOSIS — M542 Cervicalgia: Secondary | ICD-10-CM | POA: Diagnosis not present

## 2024-05-11 DIAGNOSIS — M4802 Spinal stenosis, cervical region: Secondary | ICD-10-CM | POA: Diagnosis not present

## 2024-05-11 DIAGNOSIS — M542 Cervicalgia: Secondary | ICD-10-CM | POA: Diagnosis not present

## 2024-05-11 DIAGNOSIS — M47812 Spondylosis without myelopathy or radiculopathy, cervical region: Secondary | ICD-10-CM | POA: Diagnosis not present

## 2024-05-14 ENCOUNTER — Other Ambulatory Visit (HOSPITAL_COMMUNITY): Payer: Self-pay

## 2024-05-14 MED ORDER — FLUZONE HIGH-DOSE 0.5 ML IM SUSY
PREFILLED_SYRINGE | INTRAMUSCULAR | 0 refills | Status: DC
  Filled 2024-05-14: qty 0.5, 1d supply, fill #0

## 2024-05-17 ENCOUNTER — Other Ambulatory Visit (HOSPITAL_COMMUNITY): Payer: Self-pay

## 2024-05-17 ENCOUNTER — Other Ambulatory Visit: Payer: Self-pay

## 2024-05-17 MED ORDER — OXYCODONE HCL 5 MG PO TABS
5.0000 mg | ORAL_TABLET | Freq: Four times a day (QID) | ORAL | 0 refills | Status: DC | PRN
  Filled 2024-05-17: qty 28, 7d supply, fill #0

## 2024-05-22 ENCOUNTER — Encounter: Payer: Self-pay | Admitting: Gastroenterology

## 2024-05-22 ENCOUNTER — Ambulatory Visit: Admitting: Gastroenterology

## 2024-05-22 ENCOUNTER — Other Ambulatory Visit (INDEPENDENT_AMBULATORY_CARE_PROVIDER_SITE_OTHER)

## 2024-05-22 VITALS — BP 126/82 | HR 49 | Ht 72.0 in | Wt 203.5 lb

## 2024-05-22 DIAGNOSIS — Z8619 Personal history of other infectious and parasitic diseases: Secondary | ICD-10-CM | POA: Diagnosis not present

## 2024-05-22 DIAGNOSIS — K219 Gastro-esophageal reflux disease without esophagitis: Secondary | ICD-10-CM

## 2024-05-22 DIAGNOSIS — K7460 Unspecified cirrhosis of liver: Secondary | ICD-10-CM | POA: Diagnosis not present

## 2024-05-22 DIAGNOSIS — Z79899 Other long term (current) drug therapy: Secondary | ICD-10-CM

## 2024-05-22 NOTE — Patient Instructions (Signed)
 Please go to the lab in the basement of our building to have lab work done as you leave today. Hit B for basement when you get on the elevator.  When the doors open the lab is on your left.  We will call you with the results. Thank you.   You will be due for additional labs in April.  We will remind you when it is time to go the lab.  You will be due for liver ultrasound in January 2026.  Thank you for entrusting me with your care and for choosing [facility], Dr. Elspeth Naval    _______________________________________________________  If your blood pressure at your visit was 140/90 or greater, please contact your primary care physician to follow up on this.  _______________________________________________________  If you are age 79 or older, your body mass index should be between 23-30. Your Body mass index is 27.6 kg/m. If this is out of the aforementioned range listed, please consider follow up with your Primary Care Provider.  If you are age 62 or younger, your body mass index should be between 19-25. Your Body mass index is 27.6 kg/m. If this is out of the aformentioned range listed, please consider follow up with your Primary Care Provider.   ________________________________________________________  The [facility] providers would like to encourage you to use MYCHART to communicate with providers for non-urgent requests or questions.  Due to long hold times on the telephone, sending your provider a message by [facility] may be a faster and more efficient way to get a response.  Please allow 48 business hours for a response.  Please remember that this is for non-urgent requests.  _______________________________________________________  [facility] is using a team-based approach to care.  Your team is made up of your doctor and two to three APPS. Our APPS (Nurse Practitioners and Physician Assistants) work with your physician to ensure care continuity for you.  They are fully qualified to address your health concerns and develop a treatment plan. They communicate directly with your gastroenterologist to care for you. Seeing the Advanced Practice Practitioners on your physician's team can help you by facilitating care more promptly, often allowing for earlier appointments, access to diagnostic testing, procedures, and other specialty referrals.

## 2024-05-22 NOTE — Progress Notes (Signed)
 HPI :  78 year old male here for a follow-up visit for hep C related cirrhosis, GERD.  Recall he has a history of renal neoplasm, on Eliquis  for history of A-fib.   Cirrhosis history: Hep C eradicated in 2015 at [facility], he followed up with them for several years and transitioned his care to us  in recent years.  He has done really well following eradication of hep C.  His cirrhosis is compensated.  Interestingly, he has multiple ultrasounds at [facility] in 2022 showing cirrhosis of the liver.  He had an elastography done in recent years with us  which showed low risk fibrotic change.  He has not had any decompensating events.  He takes Coreg  for his cardiovascular issues so we have not performed variceal screening.   SINCE LAST VISIT:   Patient been doing well since have last seen him.  He has had no issues with his liver disease or new decompensating events since have seen him.  He is feeling well at baseline.  He had labs last checked in October as outlined below. No jaundice, bili is normal. His RUQ US  was last done in July, no hepatomas. He is due for AFP screening . Recall he takes Coreg  and his platelet count has been normal so we have not pursued follow up EGD for varices screening. He has a severe C spine impingement and may be considering neurosurgery for that in the upcoming months, we discussed that for a bit and his surgical risks.  Recall he has a history of reflux, has been on omeprazole  in the past.  Currently takes it every other day which works pretty well to control symptoms. That is the lowest daily dose he can tolerate to control his symptoms.  His last EGD showed no Barrett's esophagus, done in 2014 per Dr. Luis.  He denies any dysphagia at this time.  We discussed long-term risks of chronic PPI and options to manage that. He reminds me his sister had esophageal cancer in her 83s.    We had previously discussed some pain in the R costal margin he has had in the past. I had thought he  had nerve impingement or musculoskeletal pain. He has had imaging evaluation for this in the past. He states it went away for a period of time and now again bothering him intermittently. He thinks he may have strained the area. We had discussed trigger point injection in the past, he spoke with is pain management physician about this who declined to offer him that. He does not feel it warrants an intervention at this time.      Prior workup: EGD 10/2012: Dr. Luis - normal   Colonoscopy 10/2012: - Dr. Luis - diverticulosis, otherwise normal    Echo 10/11/18 - EF 50-55%   Exercise stress 07/08/21 - [facility]   Colonoscopy 09/09/2022: - The perianal and digital rectal examinations were normal. - The terminal ileum appeared normal. - A 3 mm polyp was found in the ascending colon. The polyp was sessile. The polyp was removed with a cold snare. Resection and retrieval were complete. - Three flat and sessile polyps were found in the hepatic flexure. The polyps were 2 to 4 mm in size. These polyps were removed with a cold snare. Resection and retrieval were complete. - A few small-mouthed diverticula were found in the sigmoid colon. - Internal hemorrhoids were found during retroflexion. The hemorrhoids were small. - The exam was otherwise without abnormality.   Surgical [P], colon, hepatic flexure and  ascending, polyp (4) SESSILE SERRATED POLYP WITHOUT CYTOLOGIC DYSPLASIA, 2 FRAGMENTS TUBULAR ADENOMA, 3 FRAGMENTS NEGATIVE FOR HIGH-GRADE DYSPLASIA AND CARCINOMA      RUQ US  02/15/23: IMPRESSION: 1. No acute abnormality identified. 2. Mild increased echotexture of the liver, nonspecific but can be seen in fatty infiltration of liver.    RUQ US  02/15/24: IMPRESSION: 1. Diffuse increased echogenicity of the hepatic parenchyma is a nonspecific indicator of hepatocellular dysfunction, most commonly steatosis. 2. Questionable gallbladder polyps measuring 4 and 6 mm. Follow-up ultrasound recommended in 6  months to document stability.   Past Medical History:  Diagnosis Date   Ascending aortic aneurysm 12/21/2015   MR angio chest: 06/2019. Diameter of 4.2cm. annual imaging.   Chest/Abd/Pelvic CTA 01/22/23: 4 mm RUL nodule, 4.1 cm ascending TAA, aortic atherosclerosis    Atrial fibrillation (HCC) 2019   Atrial tachycardia    documented by holter 2016. Followed by Dr. K.Nelson and Dr. Kelsie.   Cancer of kidney (HCC)    right kidney-removed. no further tx other than surgery [facility].   Cirrhosis (HCC)    [facility] dx, early stage- follow up visits at [facility].   CRI (chronic renal insufficiency)    s/p R nephrectomy in 1999 for malignancy   Diabetes mellitus without complication (HCC)    NIDDM- dx. 3-4 years ago diet control   Diverticulosis of colon    mild   GERD (gastroesophageal reflux disease)    Headache(784.0)    Hepatitis C    s/p therapy [facility] (curative)/with Harvoni   History of BPH    no recent issues   History of substance abuse (HCC)    past history -none in 38 yrs   Hyperglycemia    Hyperlipemia    Hypertension    Implantable loop recorder present    Osteoarthritis, knee    knees bilaterally    Pneumonia 1975   hx of 35 years ago     Past Surgical History:  Procedure Laterality Date   Arthroscopy right knee     may '11 (Dr Ernie)   Arthroscopy, left knee,     ATRIAL FIBRILLATION ABLATION N/A 10/11/2018   Procedure: ATRIAL FIBRILLATION ABLATION;  Surgeon: Kelsie Agent, MD;  [facility]: [facility];  Service: Cardiovascular;  Laterality: N/A;   ATRIAL FIBRILLATION ABLATION N/A 06/19/2020   Procedure: ATRIAL FIBRILLATION ABLATION;  Surgeon: Kelsie Agent, MD;  Location: [facility];  Service: Cardiovascular;  Laterality: N/A;   CARDIAC ELECTROPHYSIOLOGY MAPPING AND ABLATION     ELBOW SURGERY Right 03/20/2024   history of liver biopsy  2007   History of Nephrectomy Right 1999   implantable loop recorder placement  03/19/2020    Medtronic Reveal Linq model LNQ 22  (RLB [id number] ) implantable loop recorder implanted by Dr Kelsie for evaluation of palpitations and afib management post ablation   KNEE ARTHROSCOPY Left 10/10/2012   Procedure: LEFT KNEE ARTHROSCOPY WITH MENSCIAL DEBRIDEMENT AND CHONDROPLASTY;  Surgeon: Dempsey LULLA Moan, MD;  Location: [facility];  Service: Orthopedics;  Laterality: Left;   left shoulder repair for bone spur  1990's   TEE WITHOUT CARDIOVERSION N/A 10/11/2018   Procedure: TRANSESOPHAGEAL ECHOCARDIOGRAM (TEE);  Surgeon: Lonni Slain, MD;  Location: [facility] ENDOSCOPY;  Service: Cardiovascular;  Laterality: N/A;  ablation to follow at 1030   TOTAL KNEE ARTHROPLASTY Right 08/15/2016   Procedure: RIGHT TOTAL KNEE ARTHROPLASTY, CORTISONE INJECTION OF LEFT KNEE;  Surgeon: Dempsey Moan, MD;  Location: [facility];  Service: Orthopedics;  Laterality: Right;   TOTAL KNEE  ARTHROPLASTY Left 09/04/2017   Procedure: LEFT TOTAL KNEE ARTHROPLASTY;  Surgeon: Melodi Lerner, MD;  Location: [facility];  Service: Orthopedics;  Laterality: Left;   Transurethral needle ablation procedure     TUNA procedure     Family History  Problem Relation Age of Onset   Coronary artery disease Mother    CVA Mother    Alcohol abuse Father        variceal hemorrhage   Liver disease Father        alcohol related   Alcohol abuse Sister    Diabetes Sister    Breast cancer Sister    Lung cancer Sister    Esophageal cancer Sister    Cancer Brother        head and neck   Alcohol abuse Other        Strong family Hx   Colon cancer Neg Hx    Pancreatic cancer Neg Hx    Stomach cancer Neg Hx    Rectal cancer Neg Hx    Social History   Tobacco Use   Smoking status: Former    Current packs/day: 0.00    Average packs/day: 1 pack/day for 15.0 years (15.0 ttl pk-yrs)    Types: Cigarettes    Start date: 08/01/1962    Quit date: 08/01/1977    Years since quitting: 46.8   Smokeless tobacco: Never  Vaping Use   Vaping status: Never Used  Substance Use Topics   Alcohol  use: No    Comment: past history -none in 38 yrs   Drug use: No    Comment: past hx none in 38 yrs   Current Outpatient Medications  Medication Sig Dispense Refill   acetaminophen  (TYLENOL ) 650 MG CR tablet Take 650 mg by mouth See admin instructions. Take 650 mg at 11 am and take 650 mg at 4-5 am (take with gabapentin )     allopurinol  (ZYLOPRIM ) 100 MG tablet Take 1 tablet (100 mg total) by mouth daily. 90 tablet 1   Aloe-Sodium Chloride  (AYR SALINE NASAL GEL NA) Place 1 application into the nose at bedtime.     apixaban  (ELIQUIS ) 5 MG TABS tablet Take 1 tablet (5 mg total) by mouth 2 (two) times daily. 180 tablet 1   carvedilol  (COREG ) 6.25 MG tablet Take 1 tablet (6.25 mg total) by mouth 2 (two) times daily. 180 tablet 3   Evolocumab  (REPATHA  SURECLICK) 140 MG/ML SOAJ Inject 140 mg (contents of one pen) into the skin every 14 (fourteen) days. 6 mL 3   famotidine  (PEPCID ) 20 MG tablet Take 20 mg by mouth daily as needed for heartburn.      flecainide  (TAMBOCOR ) 100 MG tablet TAKE ONE TABLET BY MOUTH TWICE DAILY 180 tablet 3   gabapentin  (NEURONTIN ) 800 MG tablet Take 1 tablet (800 mg total) by mouth 2 (two) times daily. 180 tablet 1   hydrochlorothiazide  (HYDRODIURIL ) 25 MG tablet TAKE ONE TABLET BY MOUTH DAILY 90 tablet 3   Influenza vac split trivalent PF (FLUZONE HIGH-DOSE) 0.5 ML injection Inject into the muscle. 0.5 mL 0   omeprazole  (PRILOSEC ) 20 MG capsule Take 20 mg by mouth every other day.      sodium chloride  (OCEAN) 0.65 % SOLN nasal spray Place 1 spray into both nostrils daily.     No current facility-administered medications for this visit.   Allergies  Allergen Reactions   Tramadol    Ibuprofen      Other Reaction(s): Not available  ibuprofen    Tramadol Hcl  Other reaction(s): ineffective   Acetaminophen  Other (See Comments)    Pt states he was told by [facility] MD's to keep tylenol  dosage to 2000 mg per 24 hours due to cirrhosis of liver.  Tylenol    Nsaids Other  (See Comments)    Avoid due to having 1 kidney    Pravastatin  Other (See Comments)    Pt reports this med makes him generally feel unwell.     Review of Systems: All systems reviewed and negative except where noted in HPI.    No results found.  Physical Exam: BP 126/82   Pulse (!) 49   Ht 6' (1.829 m)   Wt 203 lb 8 oz (92.3 kg)   SpO2 97%   BMI 27.60 kg/m  Constitutional: Pleasant,well-developed, male in no acute distress. Neurological: Alert and oriented to person place and time. Psychiatric: Normal mood and affect. Behavior is normal.   ASSESSMENT: 78 y.o. male here for assessment of the following  1. Cirrhosis of liver without ascites, unspecified hepatic cirrhosis type (HCC)   2. History of hepatitis C   3. Gastroesophageal reflux disease, unspecified whether esophagitis present   4. Long-term current use of proton pump inhibitor therapy    He remains well compensated since our last visit.  He has no complaints.  We discussed his liver disease in general, risks for decompensation and HCC over time.  His labs are up-to-date.  INR not accurate in the setting of anticoagulation.  He is on Coreg  for his cardiovascular comorbidities but platelets normal, I think low risk for varices on the regimen and we will forego screening at this time which she is in agreement with.  We discussed risks for [facility], continued screening is recommended every 6 months with ultrasound.  He is due for another ultrasound in January.  Due for AFP at this time and will ask him to go to the lab today.  He will continue with labs every 6 months otherwise, next due in April.  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  In general with his liver disease his risk for surgery is a bit higher however he is as compensated as 1 can be and no other further optimization of his liver function would be needed at this time.  His surgeon should know he has underlying liver disease but I think  would be okay to proceed from my perspective.  We discussed his history of reflux.  Now on omeprazole  every other day dosing and continues to work pretty well to control symptoms.  Have reviewed long-term risks of chronic PPI and he is on the lowest dose needed to control symptoms.  His remote EGD had no evidence of Barrett's, I think his risk for esophageal cancer is low.  His sister did have esophageal cancer diagnosed in her 79s, we discussed if he wanted to pursue another endoscopy at some point.  As long as he is feeling well he will likely forego further screenings.  No dysphagia or alarm symptoms.  Continue regimen and we will continue to see him for this annually.   PLAN: - may do surgery for his neck, cleared from my perspectivr as compensated as he can be - continue coreg  - no EGD needed - RUQ US  in January for [facility] screening - AFP now at lab - CBC, CMET in April - continue lowest dose of omeprazole  needed to control symptoms. No BE on last EGD, he declines EGD for now - f/u in one year or  sooner with issues  Marcey Naval, MD [facility]

## 2024-05-24 ENCOUNTER — Ambulatory Visit: Payer: Self-pay | Admitting: Gastroenterology

## 2024-05-24 LAB — AFP TUMOR MARKER: AFP-Tumor Marker: 2.5 ng/mL (ref <6.1)

## 2024-05-27 ENCOUNTER — Other Ambulatory Visit: Payer: Self-pay | Admitting: Medical Genetics

## 2024-05-27 DIAGNOSIS — Z006 Encounter for examination for normal comparison and control in clinical research program: Secondary | ICD-10-CM

## 2024-06-03 ENCOUNTER — Encounter: Payer: Self-pay | Admitting: Internal Medicine

## 2024-06-10 DIAGNOSIS — M4802 Spinal stenosis, cervical region: Secondary | ICD-10-CM | POA: Diagnosis not present

## 2024-06-15 MED FILL — Flecainide Acetate Tab 100 MG: ORAL | 90 days supply | Qty: 180 | Fill #1 | Status: AC

## 2024-06-19 ENCOUNTER — Encounter: Payer: Self-pay | Admitting: Nurse Practitioner

## 2024-06-20 ENCOUNTER — Other Ambulatory Visit: Payer: Self-pay | Admitting: Nurse Practitioner

## 2024-06-20 DIAGNOSIS — R42 Dizziness and giddiness: Secondary | ICD-10-CM

## 2024-06-27 ENCOUNTER — Encounter: Payer: Self-pay | Admitting: Gastroenterology

## 2024-07-15 ENCOUNTER — Other Ambulatory Visit (HOSPITAL_COMMUNITY): Payer: Self-pay

## 2024-07-15 MED ORDER — OXYCODONE HCL 5 MG PO TABS
5.0000 mg | ORAL_TABLET | Freq: Four times a day (QID) | ORAL | 0 refills | Status: AC | PRN
  Filled 2024-07-15: qty 28, 7d supply, fill #0

## 2024-07-20 ENCOUNTER — Other Ambulatory Visit: Payer: Self-pay | Admitting: Nurse Practitioner

## 2024-07-20 DIAGNOSIS — G629 Polyneuropathy, unspecified: Secondary | ICD-10-CM

## 2024-07-22 ENCOUNTER — Other Ambulatory Visit (HOSPITAL_COMMUNITY): Payer: Self-pay

## 2024-07-22 MED ORDER — GABAPENTIN 800 MG PO TABS
800.0000 mg | ORAL_TABLET | Freq: Two times a day (BID) | ORAL | 1 refills | Status: DC
  Filled 2024-07-22: qty 180, 90d supply, fill #0

## 2024-07-23 ENCOUNTER — Other Ambulatory Visit (HOSPITAL_COMMUNITY): Payer: Self-pay

## 2024-07-30 ENCOUNTER — Encounter (INDEPENDENT_AMBULATORY_CARE_PROVIDER_SITE_OTHER): Payer: Self-pay | Admitting: Otolaryngology

## 2024-07-30 ENCOUNTER — Ambulatory Visit (INDEPENDENT_AMBULATORY_CARE_PROVIDER_SITE_OTHER): Admitting: Otolaryngology

## 2024-07-30 VITALS — BP 112/67 | HR 62 | Ht 72.0 in | Wt 205.0 lb

## 2024-07-30 DIAGNOSIS — R42 Dizziness and giddiness: Secondary | ICD-10-CM

## 2024-07-30 NOTE — Progress Notes (Signed)
 Reason for Consult: Vertigo Referring Physician: Dr. Elnor Agent Timothy Dickson is an 78 y.o. male.  HPI: He is here for evaluation of dizziness.  He has had about 4 episodes of dizziness in his lifetime.  Most of them were acute vertigo that lasted for about a day.  He now is having episodes where he lies in bed and turns his head to the left and gets vertiginous.  It we will do it a second time if he turns his head the same direction.  When he gets up in the morning he has a vertiginous episode that lasts for about a minute or so.  This is the only time he has it.  He did try a makeshift Epley maneuver which made a difference for a few days.  He has no nasal obstruction.  No hearing loss or tinnitus.  He does have a neck problem with narrowing of his foramina and is going to have surgery in 2 weeks at [facility] with a neurosurgeon.  Past Medical History:  Diagnosis Date   Ascending aortic aneurysm 12/21/2015   MR angio chest: 06/2019. Diameter of 4.2cm. annual imaging.   Chest/Abd/Pelvic CTA 01/22/23: 4 mm RUL nodule, 4.1 cm ascending TAA, aortic atherosclerosis    Atrial fibrillation (HCC) 2019   Atrial tachycardia    documented by holter 2016. Followed by Dr. K.Nelson and Dr. Kelsie.   Cancer of kidney (HCC)    right kidney-removed. no further tx other than surgery [facility].   Cirrhosis (HCC)    [facility] dx, early stage- follow up visits at [facility].   CRI (chronic renal insufficiency)    s/p R nephrectomy in 1999 for malignancy   Diabetes mellitus without complication (HCC)    NIDDM- dx. 3-4 years ago diet control   Diverticulosis of colon    mild   GERD (gastroesophageal reflux disease)    Headache(784.0)    Hepatitis C    s/p therapy [facility] (curative)/with Harvoni   History of BPH    no recent issues   History of substance abuse (HCC)    past history -none in 38 yrs   Hyperglycemia    Hyperlipemia    Hypertension    Implantable loop recorder present    Osteoarthritis, knee    knees  bilaterally    Pneumonia 1975   hx of 35 years ago    Past Surgical History:  Procedure Laterality Date   Arthroscopy right knee     may '11 (Dr Ernie)   Arthroscopy, left knee,     ATRIAL FIBRILLATION ABLATION N/A 10/11/2018   Procedure: ATRIAL FIBRILLATION ABLATION;  Surgeon: Kelsie Agent, MD;  [facility];  Service: Cardiovascular;  Laterality: N/A;   ATRIAL FIBRILLATION ABLATION N/A 06/19/2020   Procedure: ATRIAL FIBRILLATION ABLATION;  Surgeon: Kelsie Agent, MD;  Location: [facility];  Service: Cardiovascular;  Laterality: N/A;   CARDIAC ELECTROPHYSIOLOGY MAPPING AND ABLATION     ELBOW SURGERY Right 03/20/2024   history of liver biopsy  2007   History of Nephrectomy Right 1999   implantable loop recorder placement  03/19/2020    Medtronic Reveal Linq model LNQ 22 (RLB [id number] ) implantable loop recorder implanted by Dr Kelsie for evaluation of palpitations and afib management post ablation   KNEE ARTHROSCOPY Left 10/10/2012   Procedure: LEFT KNEE ARTHROSCOPY WITH MENSCIAL DEBRIDEMENT AND CHONDROPLASTY;  Surgeon: Dempsey LULLA Moan, MD;  Location: [facility];  Service: Orthopedics;  Laterality: Left;   left shoulder repair for  bone spur  1990's   TEE WITHOUT CARDIOVERSION N/A 10/11/2018   Procedure: TRANSESOPHAGEAL ECHOCARDIOGRAM (TEE);  Surgeon: Lonni Slain, MD;  Location: [facility] ENDOSCOPY;  Service: Cardiovascular;  Laterality: N/A;  ablation to follow at 1030   TOTAL KNEE ARTHROPLASTY Right 08/15/2016   Procedure: RIGHT TOTAL KNEE ARTHROPLASTY, CORTISONE INJECTION OF LEFT KNEE;  Surgeon: Dempsey Moan, MD;  Location: [facility];  Service: Orthopedics;  Laterality: Right;   TOTAL KNEE ARTHROPLASTY Left 09/04/2017   Procedure: LEFT TOTAL KNEE ARTHROPLASTY;  Surgeon: Moan Dempsey, MD;  Location: [facility];  Service: Orthopedics;  Laterality: Left;   Transurethral needle ablation procedure     TUNA procedure      Family History  Problem Relation Age of Onset    Coronary artery disease Mother    CVA Mother    Alcohol abuse Father        variceal hemorrhage   Liver disease Father        alcohol related   Alcohol abuse Sister    Diabetes Sister    Breast cancer Sister    Lung cancer Sister    Esophageal cancer Sister    Cancer Brother        head and neck   Alcohol abuse Other        Strong family Hx   Colon cancer Neg Hx    Pancreatic cancer Neg Hx    Stomach cancer Neg Hx    Rectal cancer Neg Hx     Social History:  reports that he quit smoking about 47 years ago. His smoking use included cigarettes. He started smoking about 62 years ago. He has a 15 pack-year smoking history. He has never used smokeless tobacco. He reports that he does not drink alcohol and does not use drugs.  Allergies: Allergies[1]   No results found for this or any previous visit (from the past 48 hours).  No results found.  ROS There were no vitals taken for this visit. Physical Exam Constitutional:      Appearance: Normal appearance.  HENT:     Head: Normocephalic and atraumatic.     Right Ear: Tympanic membrane is without lesions and middle ear aerated, ear canal and external ear normal.     Left Ear: Tympanic membrane is without lesions and middle ear aerated, ear canal and external ear normal.     Nose: Nose without deviation of septum.  Turbinates with mild hypertrophy, No significant swelling or masses.     Oral cavity/oropharynx: Mucous membranes are moist. No lesions or masses    Larynx: normal voice. Mirror attempted without success    Eyes:     Extraocular Movements: Extraocular movements intact.     Conjunctiva/sclera: Conjunctivae normal.     Pupils: Pupils are equal, round, and reactive to light.  Cardiovascular:     Rate and Rhythm: Normal rate.  Pulmonary:     Effort: Pulmonary effort is normal.  Musculoskeletal:     Cervical back: Normal range of motion and neck supple. No rigidity.  Lymphadenopathy:     Cervical: No cervical  adenopathy or masses.salivary glands without lesions. .     Salivary glands- no mass or swelling Neurological:     Mental Status: He is alert. CN 2-12 intact. No nystagmus      Assessment/Plan: Vertigo-I think he has benign paroxysmal positional vertigo.  I doubt that vestibular rehab is going to want to perform a Dix-Hallpike and an Epley maneuver with his spinal surgery pending in 2 weeks.  He agrees.  He will go ahead and have his surgery and asked the neurosurgeon if after he is healed whether he can undergo an Epley maneuver if he indeed is still having the vertigo.  He will follow-up after the neurosurgery procedure  Norleen Notice 07/30/2024, 1:56 PM        [1]  Allergies Allergen Reactions   Tramadol    Ibuprofen      Other Reaction(s): Not available  ibuprofen    Tramadol Hcl     Other reaction(s): ineffective   Acetaminophen  Other (See Comments)    Pt states he was told by [facility] MD's to keep tylenol  dosage to 2000 mg per 24 hours due to cirrhosis of liver.  Tylenol    Nsaids Other (See Comments)    Avoid due to having 1 kidney    Pravastatin  Other (See Comments)    Pt reports this med makes him generally feel unwell.

## 2024-07-31 ENCOUNTER — Telehealth: Payer: Self-pay

## 2024-07-31 DIAGNOSIS — K7460 Unspecified cirrhosis of liver: Secondary | ICD-10-CM

## 2024-07-31 DIAGNOSIS — Z8619 Personal history of other infectious and parasitic diseases: Secondary | ICD-10-CM

## 2024-07-31 NOTE — Telephone Encounter (Signed)
-----   Message from [facility] sent at 02/19/2024  3:56 PM EDT ----- Regarding: RUQ due in January RUQ US  due in 6 months = due mid Jan 2026

## 2024-07-31 NOTE — Telephone Encounter (Signed)
 Order placed for RUQ U/S for cirrhosis. Patient is active on MyChart.  Message to schedulers and to patient regarding scheduling RUQ in January

## 2024-08-06 ENCOUNTER — Ambulatory Visit (HOSPITAL_COMMUNITY)

## 2024-08-07 ENCOUNTER — Encounter: Payer: Self-pay | Admitting: Nurse Practitioner

## 2024-08-07 ENCOUNTER — Other Ambulatory Visit (HOSPITAL_COMMUNITY): Payer: Self-pay

## 2024-08-08 ENCOUNTER — Other Ambulatory Visit (HOSPITAL_COMMUNITY): Payer: Self-pay

## 2024-08-08 ENCOUNTER — Other Ambulatory Visit: Payer: Self-pay | Admitting: Nurse Practitioner

## 2024-08-08 ENCOUNTER — Other Ambulatory Visit: Payer: Self-pay

## 2024-08-08 DIAGNOSIS — G629 Polyneuropathy, unspecified: Secondary | ICD-10-CM

## 2024-08-08 MED ORDER — GABAPENTIN 800 MG PO TABS
800.0000 mg | ORAL_TABLET | Freq: Two times a day (BID) | ORAL | 1 refills | Status: AC
  Filled 2024-08-08 – 2024-08-12 (×2): qty 180, 90d supply, fill #0
  Filled ????-??-??: fill #0

## 2024-08-09 ENCOUNTER — Ambulatory Visit: Admitting: Nurse Practitioner

## 2024-08-09 VITALS — BP 108/76 | HR 55 | Temp 98.0°F | Ht 72.0 in | Wt 200.2 lb

## 2024-08-09 DIAGNOSIS — Z0001 Encounter for general adult medical examination with abnormal findings: Secondary | ICD-10-CM | POA: Insufficient documentation

## 2024-08-09 DIAGNOSIS — E1169 Type 2 diabetes mellitus with other specified complication: Secondary | ICD-10-CM | POA: Diagnosis not present

## 2024-08-09 DIAGNOSIS — E785 Hyperlipidemia, unspecified: Secondary | ICD-10-CM

## 2024-08-09 DIAGNOSIS — Z0000 Encounter for general adult medical examination without abnormal findings: Secondary | ICD-10-CM | POA: Diagnosis not present

## 2024-08-09 DIAGNOSIS — E1140 Type 2 diabetes mellitus with diabetic neuropathy, unspecified: Secondary | ICD-10-CM

## 2024-08-09 NOTE — Patient Instructions (Signed)
 "  Prevnar-20  "

## 2024-08-09 NOTE — Assessment & Plan Note (Signed)
 Type 2 diabetes mellitus A1c at 7.1, reasonable target based on age and desire to avoid hypoglycemic events. Discussed hypoglycemia risks and A1c goals. - Continue management without medication. - Recheck A1c in 3-6 months.

## 2024-08-09 NOTE — Progress Notes (Signed)
 "  Established Patient Office Visit  Subjective   Patient ID: Timothy Dickson, male    DOB: Apr 28, 1946  Age: 79 y.o. MRN: [id number]  Chief Complaint  Patient presents with   Medical Management of Chronic Issues    3 month follow up    Discussed the use of AI scribe software for clinical note transcription with the patient, who gave verbal consent to proceed.  History of Present Illness Timothy Dickson is a 79 year old male with atrial fibrillation and diabetes who presents for an annual physical exam.  Renal function - Chronic kidney function stable with GFR typically in the 50s to 60s  T2DM - Diabetes managed with diet alone - A1c typically in upper 6s to low 7s; most recent 7.1 - Carries snacks such as peanut butter crackers for hypoglycemia awareness - No current use of diabetes medications  Genitourinary symptoms and prostate history - History of prostate ablation prior to 2012 for benign prostatic hyperplasia - PSA in 03/2023: 0.48  - No urinary frequency, hematuria, or other urinary symptoms  Health Maintenance: - Up-to-date on flu vaccine, zoster series, colonoscopy (told to consider re-screening 5 years after last screen which was 09/2022). Has received pneumococcal 13 and 23 vaccines, would like to consider pneumococcal 20 vaccine.        Review of Systems  Constitutional:  Negative for diaphoresis and fever.  Respiratory:  Negative for cough and shortness of breath.   Cardiovascular:  Negative for chest pain and palpitations.  Gastrointestinal:  Negative for abdominal pain, blood in stool, nausea and vomiting.  Genitourinary:  Negative for hematuria.  Psychiatric/Behavioral:  Negative for depression and suicidal ideas. The patient is not nervous/anxious.       Objective:     BP 108/76   Pulse (!) 55   Temp 98 F (36.7 C) (Temporal)   Ht 6' (1.829 m)   Wt 200 lb 4 oz (90.8 kg)   SpO2 95%   BMI 27.16 kg/m  BP Readings from Last 3 Encounters:   08/09/24 108/76  07/30/24 112/67  05/22/24 126/82   Wt Readings from Last 3 Encounters:  08/09/24 200 lb 4 oz (90.8 kg)  07/30/24 205 lb (93 kg)  05/22/24 203 lb 8 oz (92.3 kg)      Physical Exam Vitals reviewed.  Constitutional:      General: He is not in acute distress.    Appearance: Normal appearance. He is not ill-appearing.  HENT:     Head: Normocephalic and atraumatic.     Right Ear: Tympanic membrane, ear canal and external ear normal.     Left Ear: Tympanic membrane, ear canal and external ear normal.  Eyes:     General: No scleral icterus.    Extraocular Movements: Extraocular movements intact.     Conjunctiva/sclera: Conjunctivae normal.     Pupils: Pupils are equal, round, and reactive to light.  Neck:     Vascular: No carotid bruit.  Cardiovascular:     Rate and Rhythm: Normal rate and regular rhythm.     Pulses: Normal pulses.     Heart sounds: Normal heart sounds.  Pulmonary:     Effort: Pulmonary effort is normal.     Breath sounds: Normal breath sounds.  Abdominal:     General: Bowel sounds are normal. There is no distension.     Palpations: There is no mass.     Tenderness: There is no abdominal tenderness.     Hernia: No hernia  is present.  Musculoskeletal:        General: No swelling or tenderness.     Cervical back: Normal range of motion and neck supple. No rigidity.  Lymphadenopathy:     Cervical: No cervical adenopathy.  Skin:    General: Skin is warm and dry.  Neurological:     General: No focal deficit present.     Mental Status: He is alert and oriented to person, place, and time.     Cranial Nerves: No cranial nerve deficit.     Sensory: No sensory deficit.     Motor: No weakness.     Gait: Gait normal.  Psychiatric:        Mood and Affect: Mood normal.        Behavior: Behavior normal.        Judgment: Judgment normal.      No results found for any visits on 08/09/24.    The ASCVD Risk score (Arnett DK, et al., 2019)  failed to calculate for the following reasons:   The valid total cholesterol range is 130 to 320 mg/dL    Assessment & Plan:   Problem List Items Addressed This Visit   None Assessment and Plan Assessment & Plan Type 2 diabetes mellitus A1c at 7.1, reasonable target based on age and desire to avoid hypoglycemic events. Discussed hypoglycemia risks and A1c goals. - Continue management without medication. - Recheck A1c in 3-6 months.  Chronic kidney disease, stage 3 GFR stable in the fifties. No immediate concerns.  Hypertension - Continue current antihypertensive regimen.  Hyperlipidemia - Continue current lipid-lowering therapy.  Health Maintenance: - Hand out provided - Patient would like to have PCV20 administered will plan on having administered after recovery of surgery - Will consider if he would like to undergo colon cancer screening again in 2029    Return in about 3 months (around 11/07/2024) for F/U with Virginie Josten.    Lauraine FORBES Pereyra, NP  "

## 2024-08-10 ENCOUNTER — Other Ambulatory Visit (HOSPITAL_COMMUNITY): Payer: Self-pay

## 2024-08-12 ENCOUNTER — Other Ambulatory Visit: Payer: Self-pay

## 2024-08-12 ENCOUNTER — Other Ambulatory Visit (HOSPITAL_COMMUNITY): Payer: Self-pay

## 2024-08-16 ENCOUNTER — Other Ambulatory Visit (HOSPITAL_COMMUNITY): Payer: Self-pay

## 2024-08-16 ENCOUNTER — Encounter: Payer: Self-pay | Admitting: *Deleted

## 2024-08-16 MED ORDER — SENNOSIDES-DOCUSATE SODIUM 8.6-50 MG PO TABS
1.0000 | ORAL_TABLET | Freq: Two times a day (BID) | ORAL | 0 refills | Status: AC
  Filled 2024-08-16: qty 30, 15d supply, fill #0

## 2024-08-16 MED ORDER — ONDANSETRON 4 MG PO TBDP
ORAL_TABLET | ORAL | 0 refills | Status: AC
  Filled 2024-08-16: qty 20, 5d supply, fill #0

## 2024-08-16 MED ORDER — OXYCODONE HCL 5 MG PO TABS
ORAL_TABLET | ORAL | 0 refills | Status: DC
  Filled 2024-08-16: qty 84, 7d supply, fill #0

## 2024-08-16 MED ORDER — POLYETHYLENE GLYCOL 3350 17 GM/SCOOP PO POWD
ORAL | 0 refills | Status: AC
  Filled 2024-08-16: qty 238, 14d supply, fill #0

## 2024-08-16 NOTE — Progress Notes (Signed)
 Timothy Dickson                                          MRN: [id number]   08/16/2024   The [facility] Quality Team Specialist reviewed this patient medical record for the purposes of chart review for care gap closure. The following were reviewed: abstraction for care gap closure-kidney health evaluation for diabetes:eGFR  and uACR.    [facility] Quality Team

## 2024-08-17 ENCOUNTER — Other Ambulatory Visit (HOSPITAL_COMMUNITY): Payer: Self-pay

## 2024-08-17 MED ORDER — METHOCARBAMOL 750 MG PO TABS
750.0000 mg | ORAL_TABLET | Freq: Three times a day (TID) | ORAL | 0 refills | Status: AC
  Filled 2024-08-17: qty 42, 14d supply, fill #0

## 2024-08-19 ENCOUNTER — Other Ambulatory Visit (HOSPITAL_COMMUNITY): Payer: Self-pay

## 2024-08-19 ENCOUNTER — Telehealth: Payer: Self-pay | Admitting: *Deleted

## 2024-08-19 NOTE — Transitions of Care (Post Inpatient/ED Visit) (Signed)
" ° °  08/19/2024  Name: Timothy Dickson MRN: [id number] DOB: [birth year]  Today's TOC FU Call Status: Today's TOC FU Call Status:: Unsuccessful Call (1st Attempt) Unsuccessful Call (1st Attempt) Date: 08/19/24  Attempted to reach the patient regarding the most recent Inpatient/ED visit.  Follow Up Plan: Additional outreach attempts will be made to reach the patient to complete the Transitions of Care (Post Inpatient/ED visit) call.   Cathlean Headland BSN RN [facility] Health Care Management Coordinator [email or web address] Direct Dial: [phone]  Fax: [phone] Website: [email or web address]  "

## 2024-08-20 ENCOUNTER — Other Ambulatory Visit: Payer: Self-pay

## 2024-08-20 ENCOUNTER — Telehealth: Payer: Self-pay | Admitting: *Deleted

## 2024-08-20 ENCOUNTER — Other Ambulatory Visit (HOSPITAL_COMMUNITY): Payer: Self-pay

## 2024-08-20 MED ORDER — HYDROMORPHONE HCL 2 MG PO TABS
2.0000 mg | ORAL_TABLET | Freq: Four times a day (QID) | ORAL | 0 refills | Status: AC | PRN
  Filled 2024-08-20: qty 60, 10d supply, fill #0

## 2024-08-20 NOTE — Transitions of Care (Post Inpatient/ED Visit) (Signed)
 "  08/20/2024  Name: Timothy Dickson MRN: [id number] DOB: October 12, 1945  Today's TOC FU Call Status: Today's TOC FU Call Status:: Successful TOC FU Call Completed TOC FU Call Complete Date: 08/20/24  Patient's Name and Date of Birth confirmed. Name, DOB  Transition Care Management Follow-up Telephone Call Date of Discharge: 08/19/24 Discharge Facility: Other ([facility]) Name of Other ([facility]) Discharge Facility: [facility] Type of Discharge: Inpatient Admission Primary Inpatient Discharge Diagnosis:: Planned surgical cervical spinal C3-7 laminoplasty How have you been since you were released from the hospital?: Better (I'm doing fine, just in pain from the surgery- the surgeon has called in new pain medicine for me and I am getting ready to get those; I don't need to see my PCP, have a surgeon's appointment scheduled; don't want any follow up calls) Any questions or concerns?: No  Items Reviewed: Did you receive and understand the discharge instructions provided?: Yes (briefly reviewed with patient who verbalizes good understanding of same - outside hospital AVS) Medications obtained,verified, and reconciled?: No (Patient declined all aspects of medication reconciliation/ review- confirmed per patient report obtained/ is taking all newly Rx'd medications as instructed; self-manages medications and denies questions/ concerns around medications today) Medications Not Reviewed Reasons:: Other: (Patient declined all aspects of medication reconciliation/ review) Any new allergies since your discharge?: No Dietary orders reviewed?: Yes Type of Diet Ordered:: Regular diet Do you have support at home?: Yes People in Home [RPT]: spouse Name of Support/Comfort Primary Source: Reports independent in self-care activities; resides with supportive spouse, has local daughter- assists as/ if needed/ indicated  Medications Reviewed Today: Medications Reviewed Today     Reviewed by Tylik Treese M,  RN (Registered Nurse) on 08/20/24 at 1301  Med List Status: <None>   Medication Order Taking? Sig Documenting Provider Last Dose Status Informant  acetaminophen  (TYLENOL ) 650 MG CR tablet [id number]  Take 650 mg by mouth See admin instructions. Take 650 mg at 11 am and take 650 mg at 4-5 am (take with gabapentin ) [provider]  Active Self  allopurinol  (ZYLOPRIM ) 100 MG tablet [id number]  Take 1 tablet (100 mg total) by mouth daily.   Active   Aloe-Sodium Chloride  (AYR SALINE NASAL GEL NA) [id number]  Place 1 application into the nose at bedtime. [provider]  Active Self  apixaban  (ELIQUIS ) 5 MG TABS tablet [id number]  Take 1 tablet (5 mg total) by mouth 2 (two) times daily. Waddell Danelle ORN, MD  Active   carvedilol  (COREG ) 6.25 MG tablet [id number]  Take 1 tablet (6.25 mg total) by mouth 2 (two) times daily. Waddell Danelle ORN, MD  Active   Evolocumab  (REPATHA  SURECLICK) 140 MG/ML EMMANUEL [id number]  Inject 140 mg (contents of one pen) into the skin every 14 (fourteen) days. Lelon Hamilton T, PA-C  Active   famotidine  (PEPCID ) 20 MG tablet [id number]  Take 20 mg by mouth daily as needed for heartburn.  [provider]  Active Self  flecainide  (TAMBOCOR ) 100 MG tablet [id number]  TAKE ONE TABLET BY MOUTH TWICE DAILY Waddell Danelle ORN, MD  Active   gabapentin  (NEURONTIN ) 800 MG tablet [id number]  Take 1 tablet (800 mg total) by mouth 2 (two) times daily. Elnor Lauraine BRAVO, NP  Active   hydrochlorothiazide  (HYDRODIURIL ) 25 MG tablet [id number]  TAKE ONE TABLET BY MOUTH DAILY Waddell Danelle ORN, MD  Active   HYDROmorphone  (DILAUDID ) 2 MG tablet [id number]  Take 1-3 tablets (2-6 mg total) by mouth every 6 (six) hours as needed for Pain (Take  1 tab for pain 3-5/10, 2 tabs for pain 6-7/10, 3 tabs for 8-10/10) for up to 6 days   Active   methocarbamol  (ROBAXIN ) 750 MG tablet [id number]  Take 1 tablet (750 mg total) by mouth 3 (three) times daily for 14 days   Active   omeprazole  (PRILOSEC ) 20 MG capsule  [id number]  Take 20 mg by mouth every other day.  [provider]  Active Self  ondansetron  (ZOFRAN -ODT) 4 MG disintegrating tablet [id number]  Dissolve 1 tablet (4 mg total) by mouth every 6 (six) hours as needed for up to 7 days   Active   oxyCODONE  (OXY IR/ROXICODONE ) 5 MG immediate release tablet [id number]  Take 1 tablet (5 mg total) by mouth every 6 (six) hours as needed.   Active   polyethylene glycol powder (GLYCOLAX /MIRALAX ) 17 GM/SCOOP powder [id number]  Dissolve 1 capful (17g) in 4-8 ounces of liquid and take by mouth daily for 7 days.   Active   senna-docusate (SENOKOT-S) 8.6-50 MG tablet [id number]  Take 1 tablet by mouth 2 (two) times daily   Active   sodium chloride  (OCEAN) 0.65 % SOLN nasal spray [id number]  Place 1 spray into both nostrils daily. [provider]  Active Self           Home Care and Equipment/Supplies: Were Home Health Services Ordered?: Yes Name of Home Health Agency:: [facility] I think that is the name of the company- it was arranged by the surgeon at [facility] [address] Has Agency set up a time to come to your home?: Yes First Home Health Visit Date: 08/21/24 Any new equipment or medical supplies ordered?: No  Functional Questionnaire: Do you need assistance with bathing/showering or dressing?: Yes (family assisting- supervising as needed- indicated) Do you need assistance with meal preparation?: Yes (family assisting- supervising as needed- indicated) Do you need assistance with eating?: No Do you have difficulty maintaining continence: No Do you need assistance with getting out of bed/getting out of a chair/moving?: No Do you have difficulty managing or taking your medications?: No  Follow up appointments reviewed: PCP Follow-up appointment confirmed?: No (Patient declined scheduling Hospital follow up office visit with PCP by [facility] RN CM in real-time- it's not necessary- I only need to see the surgeon and that is already scheduled) MD  Provider Line [phone] Given: No (verified well-established with current PCP) Specialist Hospital Follow-up appointment confirmed?: Yes Date of Specialist follow-up appointment?: 08/27/24 Follow-Up Specialty Provider:: [facility] provider Do you need transportation to your follow-up appointment?: No  SDOH Interventions Today    Flowsheet Row Most Recent Value  SDOH Interventions   Food Insecurity Interventions Intervention Not Indicated  Housing Interventions Intervention Not Indicated  Transportation Interventions Intervention Not Indicated  [drives self at baseline,  daughter providing transportation post- recent surgery]  Utilities Interventions Intervention Not Indicated   See TOC assessment tabs for additional assessment/ TOC intervention information Provided education around common side effects of narcotic pain medicine; need to use pain medicine as prescribed/ as needed; safe use of opioid medication; strategies to prevent constipation Reinforced signs/ symptoms incisional infection along with corresponding action plan  Confirmed has contact information for surgical provider at [facility]  Patient declines need for ongoing/ further care management/ coordination outreach; declines enrollment in 30-day TOC program- declines taking my direct phone number should needs/ concerns arise post-TOC call   Pls call/ message for questions,  Barri Neidlinger Mckinney Jessey Huyett, RN, BSN, Media Planner  Transitions of Care  [facility] - Population  Health  [place] [phone]: direct office  "

## 2024-08-26 ENCOUNTER — Other Ambulatory Visit (HOSPITAL_COMMUNITY): Payer: Self-pay

## 2024-08-26 MED ORDER — OXYCODONE HCL 5 MG PO TABS
15.0000 mg | ORAL_TABLET | ORAL | 0 refills | Status: AC | PRN
  Filled 2024-08-26: qty 252, 14d supply, fill #0

## 2024-08-29 ENCOUNTER — Other Ambulatory Visit: Payer: Self-pay | Admitting: Internal Medicine

## 2024-08-29 DIAGNOSIS — I480 Paroxysmal atrial fibrillation: Secondary | ICD-10-CM

## 2024-08-30 ENCOUNTER — Other Ambulatory Visit: Payer: Self-pay

## 2024-08-30 ENCOUNTER — Other Ambulatory Visit (HOSPITAL_COMMUNITY): Payer: Self-pay

## 2024-08-30 MED ORDER — APIXABAN 5 MG PO TABS
5.0000 mg | ORAL_TABLET | Freq: Two times a day (BID) | ORAL | 2 refills | Status: AC
  Filled 2024-08-30: qty 180, 90d supply, fill #0

## 2024-08-30 NOTE — Telephone Encounter (Signed)
 Eliquis  5mg  refill request received. Patient is 79 years old, weight-90.8kg, Crea-1.23 on 05/03/24, Diagnosis-Afib, and last seen by Dr. Santo on 04/03/24. Dose is appropriate based on dosing criteria. Will send in refill to requested pharmacy.

## 2024-09-02 ENCOUNTER — Other Ambulatory Visit (HOSPITAL_COMMUNITY): Payer: Self-pay

## 2024-09-02 MED ORDER — OXYCODONE HCL 5 MG PO TABS: 15.0000 mg | ORAL_TABLET | Freq: Four times a day (QID) | ORAL | 0 refills | Status: AC | PRN

## 2025-01-01 ENCOUNTER — Other Ambulatory Visit (HOSPITAL_COMMUNITY)

## 2025-02-06 ENCOUNTER — Ambulatory Visit: Admitting: Nurse Practitioner

## 2025-03-27 ENCOUNTER — Ambulatory Visit
